# Patient Record
Sex: Male | Born: 1960 | Race: Black or African American | Hispanic: No | Marital: Single | Smoking: Former smoker
Health system: Southern US, Community
[De-identification: ages and names within clinical notes are randomized; demographics above are authoritative.]

## PROBLEM LIST (undated history)

## (undated) DIAGNOSIS — E119 Type 2 diabetes mellitus without complications: Secondary | ICD-10-CM

## (undated) DIAGNOSIS — E785 Hyperlipidemia, unspecified: Secondary | ICD-10-CM

## (undated) DIAGNOSIS — M1990 Unspecified osteoarthritis, unspecified site: Secondary | ICD-10-CM

## (undated) DIAGNOSIS — I10 Essential (primary) hypertension: Secondary | ICD-10-CM

## (undated) HISTORY — DX: Unspecified osteoarthritis, unspecified site: M19.90

## (undated) HISTORY — DX: Type 2 diabetes mellitus without complications: E11.9

## (undated) HISTORY — DX: Essential (primary) hypertension: I10

## (undated) HISTORY — PX: COLONOSCOPY WITH PROPOFOL: SHX5780

## (undated) HISTORY — PX: APPENDECTOMY: SHX54

## (undated) HISTORY — DX: Hyperlipidemia, unspecified: E78.5

---

## 2020-10-30 ENCOUNTER — Emergency Department
Admission: EM | Admit: 2020-10-30 | Discharge: 2020-10-30 | Disposition: A | Discharge status: Home / Self Care | Payer: Medicare Other | Attending: Student in an Organized Health Care Education/Training Program | Admitting: Student in an Organized Health Care Education/Training Program

## 2020-10-30 ENCOUNTER — Other Ambulatory Visit: Payer: Self-pay

## 2020-10-30 DIAGNOSIS — M25511 Pain in right shoulder: Secondary | ICD-10-CM | POA: Diagnosis not present

## 2020-10-30 DIAGNOSIS — M79604 Pain in right leg: Secondary | ICD-10-CM | POA: Diagnosis not present

## 2020-10-30 DIAGNOSIS — M25512 Pain in left shoulder: Secondary | ICD-10-CM | POA: Insufficient documentation

## 2020-10-30 DIAGNOSIS — M79605 Pain in left leg: Secondary | ICD-10-CM | POA: Diagnosis not present

## 2020-10-30 DIAGNOSIS — Z7984 Long term (current) use of oral hypoglycemic drugs: Secondary | ICD-10-CM | POA: Insufficient documentation

## 2020-10-30 DIAGNOSIS — M2550 Pain in unspecified joint: Secondary | ICD-10-CM

## 2020-10-30 DIAGNOSIS — Z794 Long term (current) use of insulin: Secondary | ICD-10-CM | POA: Diagnosis not present

## 2020-10-30 DIAGNOSIS — R000 Tachycardia, unspecified: Secondary | ICD-10-CM | POA: Diagnosis not present

## 2020-10-30 LAB — COMPREHENSIVE METABOLIC PANEL
ALT: 6 U/L (ref 0–44)
AST: 10 U/L — ABNORMAL LOW (ref 15–41)
Albumin: 3.4 g/dL — ABNORMAL LOW (ref 3.5–5.0)
Alkaline Phosphatase: 89 U/L (ref 38–126)
Anion gap: 10 (ref 5–15)
BUN: 8 mg/dL (ref 6–20)
CO2: 31 mmol/L (ref 22–32)
Calcium: 8.7 mg/dL — ABNORMAL LOW (ref 8.9–10.3)
Chloride: 99 mmol/L (ref 98–111)
Creatinine, Ser: 0.78 mg/dL (ref 0.61–1.24)
GFR, Estimated: >60 mL/min (ref >60)
Glucose, Bld: 110 mg/dL — ABNORMAL HIGH (ref 70–99)
Potassium: 3.4 mmol/L — ABNORMAL LOW (ref 3.5–5.1)
Sodium: 140 mmol/L (ref 135–145)
Total Bilirubin: 0.6 mg/dL (ref 0.3–1.2)
Total Protein: 6.6 g/dL (ref 6.5–8.1)

## 2020-10-30 LAB — CBC
HCT: 43.4 % (ref 39.0–52.0)
Hemoglobin: 14.4 g/dL (ref 13.0–17.0)
MCH: 30.6 pg (ref 26.0–34.0)
MCHC: 33.2 g/dL (ref 30.0–36.0)
MCV: 92.3 fL (ref 80.0–100.0)
Platelets: 453 10*3/uL — ABNORMAL HIGH (ref 150–400)
RBC: 4.7 MIL/uL (ref 4.22–5.81)
RDW: 17.1 % — ABNORMAL HIGH (ref 11.5–15.5)
WBC: 10.2 10*3/uL (ref 4.0–10.5)
nRBC: 0 % (ref 0.0–0.2)

## 2020-10-30 LAB — CBG MONITORING, ED: Glucose-Capillary: 112 mg/dL — ABNORMAL HIGH (ref 70–99)

## 2020-10-30 MED ORDER — PREGABALIN 150 MG PO CAPS: 150.0000 mg | ORAL_CAPSULE | Freq: Two times a day (BID) | ORAL | 2 refills | Status: DC

## 2020-10-30 MED ORDER — INSULIN DEGLUDEC 100 UNIT/ML ~~LOC~~ SOPN: 32.0000 [IU] | PEN_INJECTOR | Freq: Every day | SUBCUTANEOUS | 1 refills | Status: DC

## 2020-10-30 MED ORDER — HYDROCHLOROTHIAZIDE 25 MG PO TABS: 25.0000 mg | ORAL_TABLET | Freq: Every day | ORAL | 1 refills | Status: DC

## 2020-10-30 MED ORDER — HYDROCODONE-ACETAMINOPHEN 5-325 MG PO TABS
1.0000 | ORAL_TABLET | Freq: Once | ORAL | Status: AC
Start: 2020-10-30 — End: 2020-10-30
  Administered 2020-10-30: 1 via ORAL
  Filled 2020-10-30: qty 1

## 2020-10-30 MED ORDER — LOSARTAN POTASSIUM 25 MG PO TABS: 25.0000 mg | ORAL_TABLET | Freq: Every day | ORAL | 11 refills | Status: DC

## 2020-10-30 MED ORDER — OXYCODONE-ACETAMINOPHEN 10-325 MG PO TABS: 1.0000 | ORAL_TABLET | Freq: Four times a day (QID) | ORAL | 0 refills | Status: DC | PRN

## 2020-10-30 MED ORDER — ATORVASTATIN CALCIUM 40 MG PO TABS: 40.0000 mg | ORAL_TABLET | Freq: Every day | ORAL | 11 refills | Status: DC

## 2020-10-30 MED ORDER — METFORMIN HCL 1000 MG PO TABS: 1000.0000 mg | ORAL_TABLET | Freq: Every day | ORAL | 1 refills | Status: DC

## 2020-10-30 MED ORDER — VICTOZA 18 MG/3ML ~~LOC~~ SOPN: 1.2000 mg | PEN_INJECTOR | Freq: Every day | SUBCUTANEOUS | 0 refills | Status: DC

## 2020-10-30 NOTE — ED Provider Notes (Signed)
Emergency Medicine Provider Triage Evaluation Note  James [name] , a 60 y.o. male  was evaluated in triage.  Pt complains of  bilateral leg pain and shoulder pain for several months.  Came to ER because can get pcp appointment until December.  Out of diabetic meds..  Review of Systems  Positive: Joint pain Negative: fever  Physical Exam  BP 135/77 (BP Location: Left Arm)   Pulse 82   Temp 97.8 F (36.6 C) (Oral)   Resp 20   Ht 6' (1.829 m)   Wt (!) 170.1 kg   SpO2 97%   BMI 50.86 kg/m  Gen:   Awake, no distress   Resp:  Normal effort  MSK:   Moves extremities without difficulty  Other:    Medical Decision Making  Medically screening exam initiated at 11:31 AM.  Appropriate orders placed.  James [name] was informed that the remainder of the evaluation will be completed by another provider, this initial triage assessment does not replace that evaluation, and the importance of remaining in the ED until their evaluation is complete.     Merlyn Lot, MD 10/30/20 1135

## 2020-10-30 NOTE — ED Triage Notes (Signed)
Pt here with bilateral leg pain and left shoulder pain. Pt just moved here from [place] and has not established primary care here and is not able to see someone until December. Pt was receiving steroid shots in [place]. Pt needs diabetic medication and pain medication. Pt in NAD in triage.

## 2020-10-30 NOTE — ED Provider Notes (Signed)
[facility] Emergency Department Provider Note    Event Date/Time   First MD Initiated Contact with Patient 10/30/20 1225     (approximate)  I have reviewed the triage vital signs and the nursing notes.   HISTORY  Chief Complaint Leg Pain and Shoulder Pain    HPI James [name] is a 60 y.o. male with an extensive past medical history presents to the ER for evaluation of several months of leg pain as well as shoulder pain.  Denies any injury.  Has a history of bursitis as well as arthritis previously having cortisone shots performed when he is living back in [place].  Recently moved to the area and has not established care with primary doctor.  States has been out of his medications for several weeks.  Denies any fevers no chest pain or shortness of breath.  No nausea or vomiting.  States his primary reason for coming in is to get refills on his medications.  No past medical history on file. No family history on file.  There are no problems to display for this patient.     Prior to Admission medications   Medication Sig Start Date End Date Taking? Authorizing Provider  atorvastatin (LIPITOR) 40 MG tablet Take 1 tablet (40 mg total) by mouth daily. 10/30/20 10/30/21 Yes Merlyn Lot, MD  hydrochlorothiazide (HYDRODIURIL) 25 MG tablet Take 1 tablet (25 mg total) by mouth daily. 10/30/20  Yes Merlyn Lot, MD  insulin degludec ([name]) 100 UNIT/ML FlexTouch Pen Inject 32 Units into the skin daily. 10/30/20  Yes Merlyn Lot, MD  liraglutide (VICTOZA) 18 MG/3ML SOPN Inject 1.2 mg into the skin daily. 10/30/20  Yes Merlyn Lot, MD  losartan (COZAAR) 25 MG tablet Take 1 tablet (25 mg total) by mouth daily. 10/30/20 10/30/21 Yes Merlyn Lot, MD  metFORMIN (GLUCOPHAGE) 1000 MG tablet Take 1 tablet (1,000 mg total) by mouth daily with breakfast. 10/30/20  Yes Merlyn Lot, MD  oxyCODONE-acetaminophen (PERCOCET) 10-325 MG tablet  Take 1 tablet by mouth every 6 (six) hours as needed for pain. 10/30/20 10/30/21 Yes Merlyn Lot, MD  pregabalin (LYRICA) 150 MG capsule Take 1 capsule (150 mg total) by mouth 2 (two) times daily. 300mg  in AM, 150mg  at night 10/30/20 10/30/21 Yes Merlyn Lot, MD    Allergies Patient has no known allergies.    Social History    Review of Systems Patient denies headaches, rhinorrhea, blurry vision, numbness, shortness of breath, chest pain, edema, cough, abdominal pain, nausea, vomiting, diarrhea, dysuria, fevers, rashes or hallucinations unless otherwise stated above in HPI. ____________________________________________   PHYSICAL EXAM:  VITAL SIGNS: Vitals:   10/30/20 1123  BP: 135/77  Pulse: 82  Resp: 20  Temp: 97.8 F (36.6 C)  SpO2: 97%    Constitutional: Alert and oriented.  Eyes: Conjunctivae are normal.  Head: Atraumatic. Nose: No congestion/rhinnorhea. Mouth/Throat: Mucous membranes are moist.   Neck: No stridor. Painless ROM.  Cardiovascular: Normal rate, regular rhythm. Grossly normal heart sounds.  Good peripheral circulation. Respiratory: Normal respiratory effort.  No retractions. Lungs CTAB. Gastrointestinal: Soft and nontender. No distention. No abdominal bruits. No CVA tenderness. Genitourinary:  Musculoskeletal: No lower extremity tenderness nor edema.  No joint effusions.  No deformity.  N/v intact throughout Neurologic:  Normal speech and language. No gross focal neurologic deficits are appreciated. No facial droop Skin:  Skin is warm, dry and intact. No rash noted. Psychiatric: Mood and affect are normal. Speech and behavior are normal.  ____________________________________________   LABS (  all labs ordered are listed, but only abnormal results are displayed)  Results for orders placed or performed during the hospital encounter of 10/30/20 (from the past 24 hour(s))  CBG monitoring, ED     Status: Abnormal   Collection Time: 10/30/20  11:27 AM  Result Value Ref Range   Glucose-Capillary 112 (H) 70 - 99 mg/dL  CBC     Status: Abnormal   Collection Time: 10/30/20 11:28 AM  Result Value Ref Range   WBC 10.2 4.0 - 10.5 K/uL   RBC 4.70 4.22 - 5.81 MIL/uL   Hemoglobin 14.4 13.0 - 17.0 g/dL   HCT 43.4 39.0 - 52.0 %   MCV 92.3 80.0 - 100.0 fL   MCH 30.6 26.0 - 34.0 pg   MCHC 33.2 30.0 - 36.0 g/dL   RDW 17.1 (H) 11.5 - 15.5 %   Platelets 453 (H) 150 - 400 K/uL   nRBC 0.0 0.0 - 0.2 %  Comprehensive metabolic panel     Status: Abnormal   Collection Time: 10/30/20 11:28 AM  Result Value Ref Range   Sodium 140 135 - 145 mmol/L   Potassium 3.4 (L) 3.5 - 5.1 mmol/L   Chloride 99 98 - 111 mmol/L   CO2 31 22 - 32 mmol/L   Glucose, Bld 110 (H) 70 - 99 mg/dL   BUN 8 6 - 20 mg/dL   Creatinine, Ser 0.78 0.61 - 1.24 mg/dL   Calcium 8.7 (L) 8.9 - 10.3 mg/dL   Total Protein 6.6 6.5 - 8.1 g/dL   Albumin 3.4 (L) 3.5 - 5.0 g/dL   AST 10 (L) 15 - 41 U/L   ALT 6 0 - 44 U/L   Alkaline Phosphatase 89 38 - 126 U/L   Total Bilirubin 0.6 0.3 - 1.2 mg/dL   GFR, Estimated >60 >60 mL/min   Anion gap 10 5 - 15   ____________________________________________  EKG My review and personal interpretation at Time: 11:35   Indication: shoulder pain  Rate: 80  Rhythm: sinus Axis: normal Other: normal intervals, no stemi ____________________________________________  RADIOLOGY  ____________________________________________   PROCEDURES  Procedure(s) performed:  Procedures    Critical Care performed: no ____________________________________________   INITIAL IMPRESSION / ASSESSMENT AND PLAN / ED COURSE  Pertinent labs & imaging results that were available during my care of the patient were reviewed by me and considered in my medical decision making (see chart for details).   DDX: Arthralgia, arthritis, bursitis, chronic pain, medication noncompliance, will check  James [name] is a 60 y.o. who presents to the ED with  presentation as described above.  Patient clinically very well-appearing in no acute distress.  Symptoms have been chronic well-known to the patient with no new features.  Primary concern is needing refills on his medications he is not able to get into see his primary doctor until December.  Does not appear to have infectious process.  Exam is otherwise reassuring.  Refill sent to his pharmacy.  Discussed strict return precautions.     The patient was evaluated in Emergency Department today for the symptoms described in the history of present illness. He/she was evaluated in the context of the global COVID-19 pandemic, which necessitated consideration that the patient might be at risk for infection with the SARS-CoV-2 virus that causes COVID-19. Institutional protocols and algorithms that pertain to the evaluation of patients at risk for COVID-19 are in a state of rapid change based on information released by regulatory bodies including the CDC and federal and  state organizations. These policies and algorithms were followed during the patient's care in the ED.  As part of my medical decision making, I reviewed the following data within the [place] notes reviewed and incorporated, Labs reviewed, notes from prior ED visits and [place] Controlled Substance Database   ____________________________________________   FINAL CLINICAL IMPRESSION(S) / ED DIAGNOSES  Final diagnoses:  Arthralgia, unspecified joint      NEW MEDICATIONS STARTED DURING THIS VISIT:  New Prescriptions   ATORVASTATIN (LIPITOR) 40 MG TABLET    Take 1 tablet (40 mg total) by mouth daily.   HYDROCHLOROTHIAZIDE (HYDRODIURIL) 25 MG TABLET    Take 1 tablet (25 mg total) by mouth daily.   INSULIN DEGLUDEC ([name]) 100 UNIT/ML FLEXTOUCH PEN    Inject 32 Units into the skin daily.   LIRAGLUTIDE (VICTOZA) 18 MG/3ML SOPN    Inject 1.2 mg into the skin daily.   LOSARTAN (COZAAR) 25 MG TABLET    Take 1 tablet (25  mg total) by mouth daily.   METFORMIN (GLUCOPHAGE) 1000 MG TABLET    Take 1 tablet (1,000 mg total) by mouth daily with breakfast.   OXYCODONE-ACETAMINOPHEN (PERCOCET) 10-325 MG TABLET    Take 1 tablet by mouth every 6 (six) hours as needed for pain.   PREGABALIN (LYRICA) 150 MG CAPSULE    Take 1 capsule (150 mg total) by mouth 2 (two) times daily. 300mg  in AM, 150mg  at night     Note:  This document was prepared using Dragon voice recognition software and may include unintentional dictation errors.    Merlyn Lot, MD 10/30/20 1259

## 2020-11-09 ENCOUNTER — Ambulatory Visit (INDEPENDENT_AMBULATORY_CARE_PROVIDER_SITE_OTHER): Payer: Medicare Other | Admitting: Family Medicine

## 2020-11-09 ENCOUNTER — Other Ambulatory Visit: Payer: Self-pay | Admitting: Family Medicine

## 2020-11-09 ENCOUNTER — Other Ambulatory Visit: Payer: Self-pay

## 2020-11-09 ENCOUNTER — Encounter: Payer: Self-pay | Admitting: Family Medicine

## 2020-11-09 VITALS — BP 132/83 | HR 94 | Ht 72.0 in | Wt 367.0 lb

## 2020-11-09 DIAGNOSIS — G8929 Other chronic pain: Secondary | ICD-10-CM

## 2020-11-09 DIAGNOSIS — Z794 Long term (current) use of insulin: Secondary | ICD-10-CM

## 2020-11-09 DIAGNOSIS — M5136 Other intervertebral disc degeneration, lumbar region: Secondary | ICD-10-CM | POA: Insufficient documentation

## 2020-11-09 DIAGNOSIS — Z23 Encounter for immunization: Secondary | ICD-10-CM

## 2020-11-09 DIAGNOSIS — M159 Polyosteoarthritis, unspecified: Secondary | ICD-10-CM | POA: Diagnosis not present

## 2020-11-09 DIAGNOSIS — E1169 Type 2 diabetes mellitus with other specified complication: Secondary | ICD-10-CM

## 2020-11-09 DIAGNOSIS — M51369 Other intervertebral disc degeneration, lumbar region without mention of lumbar back pain or lower extremity pain: Secondary | ICD-10-CM | POA: Insufficient documentation

## 2020-11-09 DIAGNOSIS — E119 Type 2 diabetes mellitus without complications: Secondary | ICD-10-CM | POA: Insufficient documentation

## 2020-11-09 DIAGNOSIS — M25551 Pain in right hip: Secondary | ICD-10-CM

## 2020-11-09 DIAGNOSIS — G894 Chronic pain syndrome: Secondary | ICD-10-CM

## 2020-11-09 MED ORDER — OXYCODONE-ACETAMINOPHEN 10-325 MG PO TABS: 1.0000 | ORAL_TABLET | Freq: Two times a day (BID) | ORAL | 0 refills | Status: DC | PRN

## 2020-11-09 NOTE — Telephone Encounter (Signed)
Medication: pregabalin (LYRICA) 150 MG capsule [[id number]]   Has the patient contacted their pharmacy? Yes- Advised to contact the doctor (Agent: If no, request that the patient contact the pharmacy for the refill. If patient does not wish to contact the pharmacy document the reason why and proceed with request.) (Agent: If yes, when and what did the pharmacy advise?)  Preferred Pharmacy (with phone number or street name): [id number] - [place], [place] [address] [place] [address] Phone: [phone] Fax: [phone] Hours: Not open 24 hours   Has the patient been seen for an appointment in the last year OR does the patient have an upcoming appointment? Today  Agent: Please be advised that RX refills may take up to 3 business days. We ask that you follow-up with your pharmacy.

## 2020-11-09 NOTE — Progress Notes (Signed)
Subjective:    Patient ID: James [name], male    DOB: 26-Jun-1960, 60 y.o.   MRN: [id number]  Kristan Votta is a 60 y.o. 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  HPI  In [place] he was followed by Orthopedic, he was planning to bariatric surgery He was on steroids in the past and gained weight. He was on chronic pain.  His new patient apt is scheduled for December 2022, however today is hospital f/u from ED due to acute needs.   ED FOLLOW-UP VISIT  Hospital/Location: [facility] Date of ED Visit: 10/30/20  Reason for Presenting to ED: Chronic Joint Pain / Arthritis  FOLLOW-UP  - ED provider note and record have been reviewed - Patient presents today about 10 days after recent ED visit. Brief summary of recent course, patient had symptoms of chronic pain for years, previously in [place] on pain management and had followed with orthopedic he was advised to have hip replacement and other joint surgery but he could not pursue due to obesity, he was going to proceed with bariatric surgery, but did not complete it until he moved. He has been on chronic steroids for joint pain and arthritis and developed diabetes and wt gain complications.  He has been on oxycodone for period of time for chronic pain to help him function, he was seen in ED 10 days ago and given short term supply, has not established w/ PCP, Pain Doctor locally, or Ortho or Bariatric. Will need referrals. He is out of pain medication. He did not get Lyrica even though it was sent by ED  ED RE ordered all his medications including Oxycodone 12 pills  He takes Oxycodone 10/325 mg 1-2 per day PRN pain, some days he will SKIP and not take at all, otherwise max is 2 per day  He uses walker for ambulation, he has difficulty with changing position from sitting to standing at  times depending on chair he is in.  Other medications have been refilled by ED  Type 2 Diabetes On insulin, [name] and Victoza, he was on 32 u tresiba, and asks about dosing, his last A1c in 6 range has remained controlled, also on metformin  I have reviewed the discharge medication list, and have reconciled the current and discharge medications today.   No flowsheet data found.  Past Medical History:  Diagnosis Date   Arthritis    Diabetes mellitus without complication ([place])    Hyperlipidemia    Hypertension    History reviewed. No pertinent surgical history. Social History   Socioeconomic History   Marital status: Single    Spouse name: Not on file   Number of children: Not on file   Years of education: Not on file   Highest education level: Not on file  Occupational History   Not on file  Tobacco Use   Smoking status: Not on file   Smokeless tobacco: Not on file  Substance and Sexual Activity   Alcohol use: Not on file   Drug use: Not on file   Sexual activity: Not on file  Other Topics Concern   Not on file  Social History Narrative   Not on file   Social Determinants of Health   Financial Resource Strain: Not on file  Food Insecurity: Not on file  Transportation Needs: Not on file  Physical Activity:  Not on file  Stress: Not on file  Social Connections: Not on file  Intimate Partner Violence: Not on file   History reviewed. No pertinent family history. Current Outpatient Medications on File Prior to Visit  Medication Sig   atorvastatin (LIPITOR) 40 MG tablet Take 1 tablet (40 mg total) by mouth daily.   hydrochlorothiazide (HYDRODIURIL) 25 MG tablet Take 1 tablet (25 mg total) by mouth daily.   insulin degludec ([name]) 100 UNIT/ML FlexTouch Pen Inject 32 Units into the skin daily.   liraglutide (VICTOZA) 18 MG/3ML SOPN Inject 1.2 mg into the skin daily.   losartan (COZAAR) 25 MG tablet Take 1 tablet (25 mg total) by mouth daily.   metFORMIN  (GLUCOPHAGE) 1000 MG tablet Take 1 tablet (1,000 mg total) by mouth daily with breakfast.   pregabalin (LYRICA) 150 MG capsule Take 1 capsule (150 mg total) by mouth 2 (two) times daily. 300mg  in AM, 150mg  at night   No current facility-administered medications on file prior to visit.    Review of Systems Per HPI unless specifically indicated above      Objective:    BP 132/83   Pulse 94   Ht 6' (1.829 m)   Wt (!) 367 lb (166.5 kg)   SpO2 96%   BMI 49.77 kg/m   Wt Readings from Last 3 Encounters:  11/09/20 (!) 367 lb (166.5 kg)  10/30/20 (!) 375 lb (170.1 kg)    Physical Exam Vitals and nursing note reviewed.  Constitutional:      General: He is not in acute distress.    Appearance: Normal appearance. He is well-developed. He is obese. He is not diaphoretic.     Comments: Well-appearing, uncomfortable/pain, cooperative  HENT:     Head: Normocephalic and atraumatic.  Eyes:     General:        Right eye: No discharge.        Left eye: No discharge.     Conjunctiva/sclera: Conjunctivae normal.  Cardiovascular:     Rate and Rhythm: Normal rate.  Pulmonary:     Effort: Pulmonary effort is normal.  Musculoskeletal:     Comments: Using walker  Skin:    General: Skin is warm and dry.     Findings: No erythema or rash.  Neurological:     Mental Status: He is alert and oriented to person, place, and time.  Psychiatric:        Mood and Affect: Mood normal.        Behavior: Behavior normal.        Thought Content: Thought content normal.     Comments: Well groomed, good eye contact, normal speech and thoughts   Results for orders placed or performed during the hospital encounter of 10/30/20  CBC  Result Value Ref Range   WBC 10.2 4.0 - 10.5 K/uL   RBC 4.70 4.22 - 5.81 MIL/uL   Hemoglobin 14.4 13.0 - 17.0 g/dL   HCT 43.4 39.0 - 52.0 %   MCV 92.3 80.0 - 100.0 fL   MCH 30.6 26.0 - 34.0 pg   MCHC 33.2 30.0 - 36.0 g/dL   RDW 17.1 (H) 11.5 - 15.5 %   Platelets 453 (H)  150 - 400 K/uL   nRBC 0.0 0.0 - 0.2 %  Comprehensive metabolic panel  Result Value Ref Range   Sodium 140 135 - 145 mmol/L   Potassium 3.4 (L) 3.5 - 5.1 mmol/L   Chloride 99 98 - 111 mmol/L   CO2 31  22 - 32 mmol/L   Glucose, Bld 110 (H) 70 - 99 mg/dL   BUN 8 6 - 20 mg/dL   Creatinine, Ser 0.78 0.61 - 1.24 mg/dL   Calcium 8.7 (L) 8.9 - 10.3 mg/dL   Total Protein 6.6 6.5 - 8.1 g/dL   Albumin 3.4 (L) 3.5 - 5.0 g/dL   AST 10 (L) 15 - 41 U/L   ALT 6 0 - 44 U/L   Alkaline Phosphatase 89 38 - 126 U/L   Total Bilirubin 0.6 0.3 - 1.2 mg/dL   GFR, Estimated >60 >60 mL/min   Anion gap 10 5 - 15  CBG monitoring, ED  Result Value Ref Range   Glucose-Capillary 112 (H) 70 - 99 mg/dL      Assessment & Plan:   Problem List Items Addressed This Visit     Primary osteoarthritis involving multiple joints   Relevant Medications   oxyCODONE-acetaminophen (PERCOCET) 10-325 MG tablet   Other Relevant Orders   Ambulatory referral to Pain Clinic   Ambulatory referral to Orthopedic Surgery   Diabetes mellitus ([place])   DDD (degenerative disc disease), lumbar   Relevant Medications   oxyCODONE-acetaminophen (PERCOCET) 10-325 MG tablet   Other Relevant Orders   Ambulatory referral to Pain Clinic   Ambulatory referral to Orthopedic Surgery   Chronic pain syndrome - Primary   Relevant Medications   oxyCODONE-acetaminophen (PERCOCET) 10-325 MG tablet   Other Relevant Orders   Ambulatory referral to Pain Clinic   Ambulatory referral to Orthopedic Surgery   Other Visit Diagnoses     Chronic right hip pain       Relevant Medications   oxyCODONE-acetaminophen (PERCOCET) 10-325 MG tablet   Other Relevant Orders   Ambulatory referral to Pain Clinic   Ambulatory referral to Orthopedic Surgery   Needs flu shot       Relevant Orders   Flu Vaccine QUAD 29mo+IM (Fluarix, Fluzone & Alfiuria Quad PF)       chronic pain syndrome with multiple joint OA/DJD and Lumbar DDD, Hip Pain, Shoulder pain, he was  managed in [place] on chronic steroids and cortisone injections that caused worse weight gain and diabetes, he has been on opiate management per Pain Management in [place], records requested, now he is limited with ambulation due to pain and difficulty losing weight. He will be bridged on Oxycodone until he can establish with Pain Management.   PDMP reviewed.  Referral to [facility] Pain management, will temporarily cover Oxycodone for 30 pills for now, can return 30 days if need new order until established w/ pain management  He is aware I can only temporarily manage his chronic pain on opiate at this time.  Referral to Orthopedic surgery for consult on chronic joint pain and may warrant future surgical intervention, he was anticipating hip surgery in past, his goal is to lose weight prior to surgery  May need return to [facility], we can consider referral at next apt if indicated.   Meds ordered this encounter  Medications   oxyCODONE-acetaminophen (PERCOCET) 10-325 MG tablet    Sig: Take 1 tablet by mouth 2 (two) times daily as needed for pain.    Dispense:  30 tablet    Refill:  0      Follow up plan: Return in about 4 weeks (around 12/07/2020) for 4 weeks follow-up DM A1c / Pain Management / Wt.  Nobie Putnam, [facility] 11/09/2020, 10:47 AM

## 2020-11-09 NOTE — Patient Instructions (Addendum)
Thank you for coming to the office today.  Decrease [name] to 25 units per day Increase Victoza to 1.8 per day  [facility] [place], [place]  [address] Phone: [phone]  [facility] Address: [address] [place], [place], [place] [address] Phone: [phone]  Refilled medication for 1 month   Please schedule a Follow-up Appointment to: Return in about 4 weeks (around 12/07/2020) for 4 weeks follow-up DM A1c / Pain Management / Wt.  If you have any other questions or concerns, please feel free to call the office or send a message through [facility]. You may also schedule an earlier appointment if necessary.  Additionally, you may be receiving a survey about your experience at our office within a few days to 1 week by e-mail or mail. We value your feedback.  Nobie Putnam, DO [place]

## 2020-11-10 MED ORDER — PREGABALIN 150 MG PO CAPS: ORAL_CAPSULE | ORAL | 2 refills | Status: DC

## 2020-11-10 NOTE — Telephone Encounter (Signed)
Requested medication (s) are due for refill today:   Provider to review  Requested medication (s) are on the active medication list:   Yes  Future visit scheduled:   Yes   Last ordered: 10/30/2020 #60, 2 refills  Returned because this is a non delegated refill.  It was given in the ED.   Requested Prescriptions  Pending Prescriptions Disp Refills   pregabalin (LYRICA) 150 MG capsule 60 capsule 2    Sig: Take 1 capsule (150 mg total) by mouth 2 (two) times daily. 300mg  in AM, 150mg  at night     Not Delegated - Neurology:  Anticonvulsants - Controlled Failed - 11/09/2020  5:41 PM      Failed - This refill cannot be delegated      Passed - Valid encounter within last 12 months    Recent Outpatient Visits           Yesterday Chronic pain syndrome   [place], DO       Future Appointments             In 4 weeks Parks Ranger, Devonne Doughty, DO [facility], [place]   In 1 month Parks Ranger, Devonne Doughty, DO [facility], [place]   In 2 months Juline Patch, MD [facility], [facility]

## 2020-12-08 IMAGING — CR DG HIP (WITH OR WITHOUT PELVIS) 2-3V*L*
1 series · 3 of 3 positions shown · non-contrast
Comparison: None.

CLINICAL DATA: Chronic left hip pain

EXAM:
DG HIP (WITH OR WITHOUT PELVIS) 2-3V LEFT

[Series 1: dg hip unilat w or w/o pelvis 2-3 views  · non-contrast · 0.14mm/px · 3 of 3 slices shown]
[im 1/3]
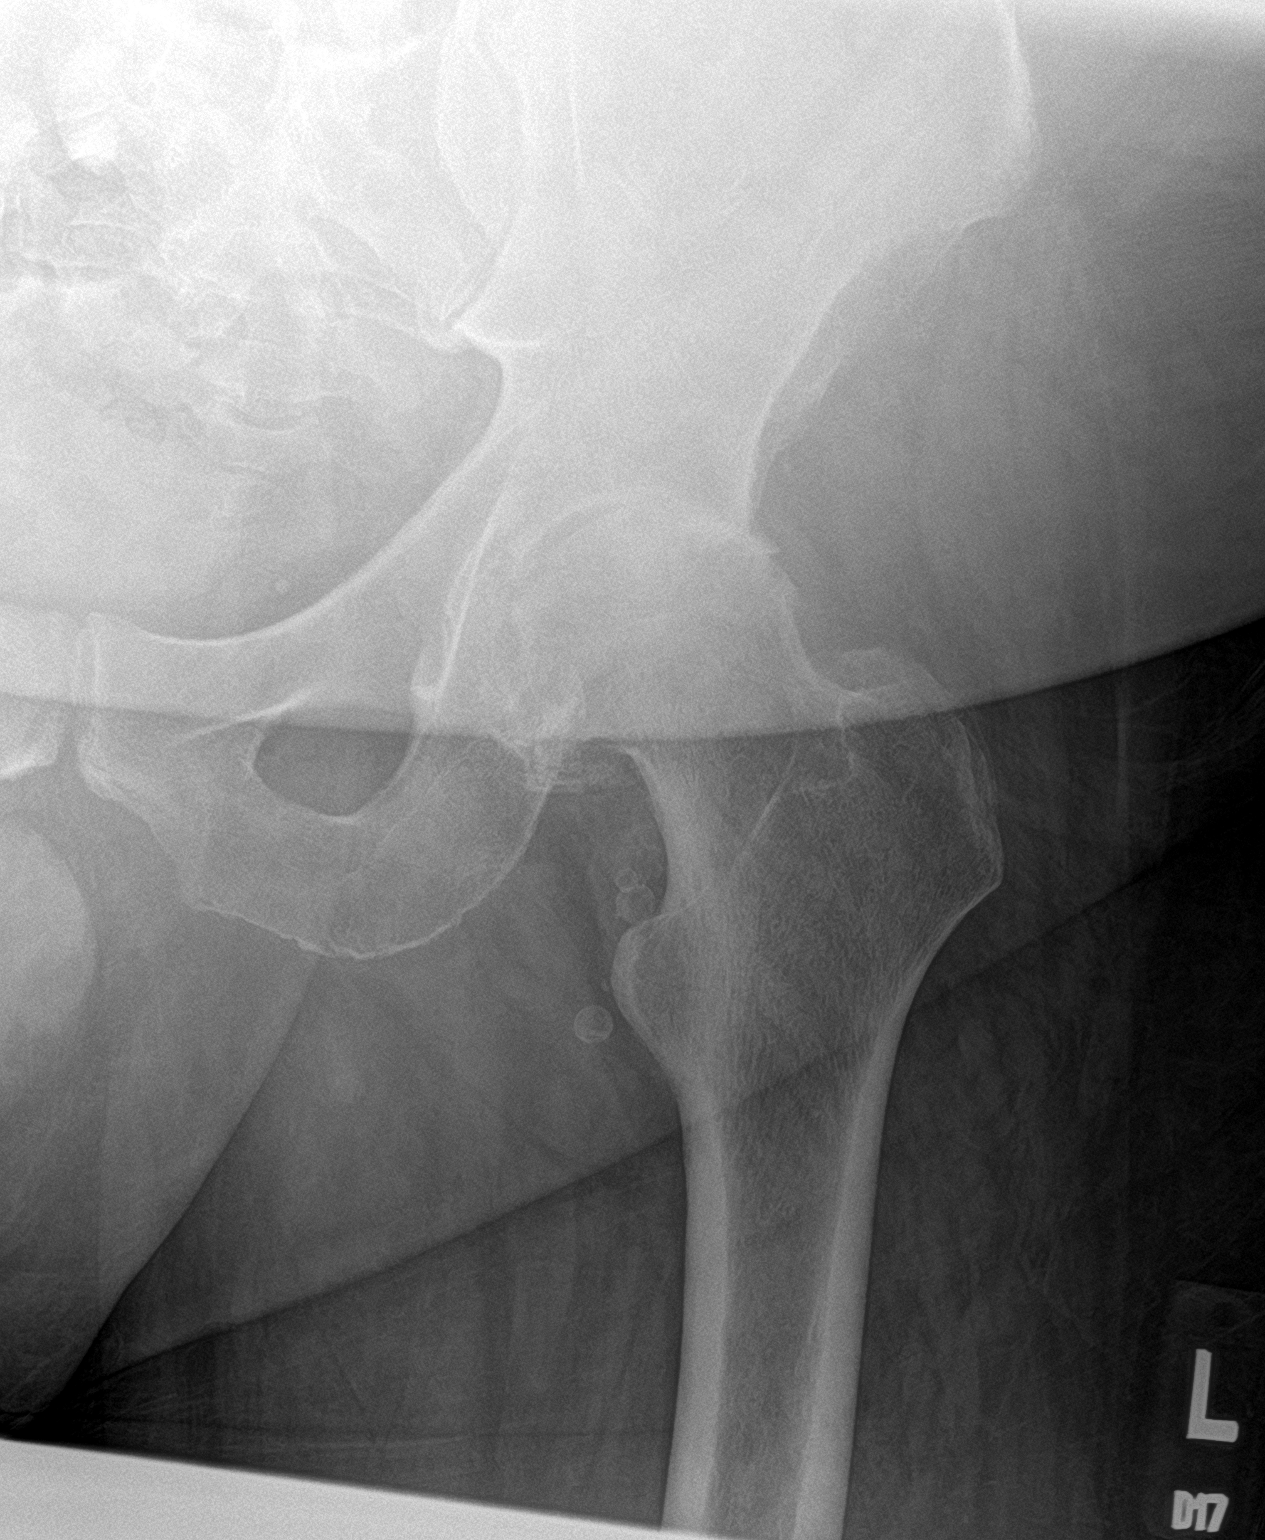
[im 2/3]
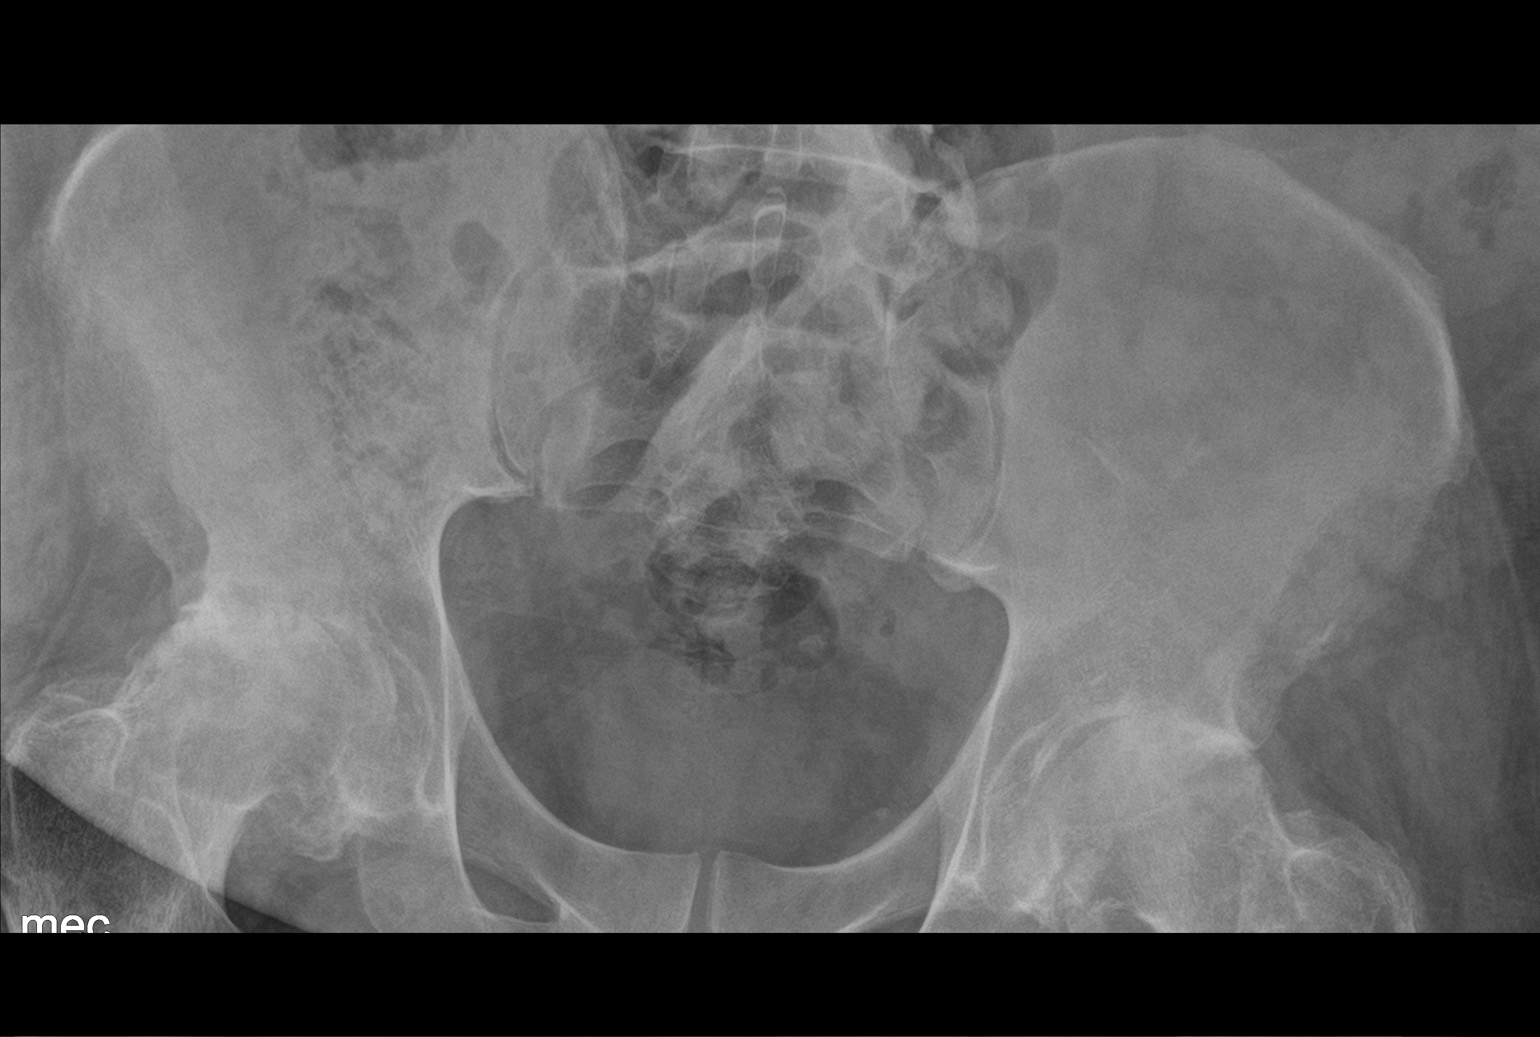
[im 3/3]
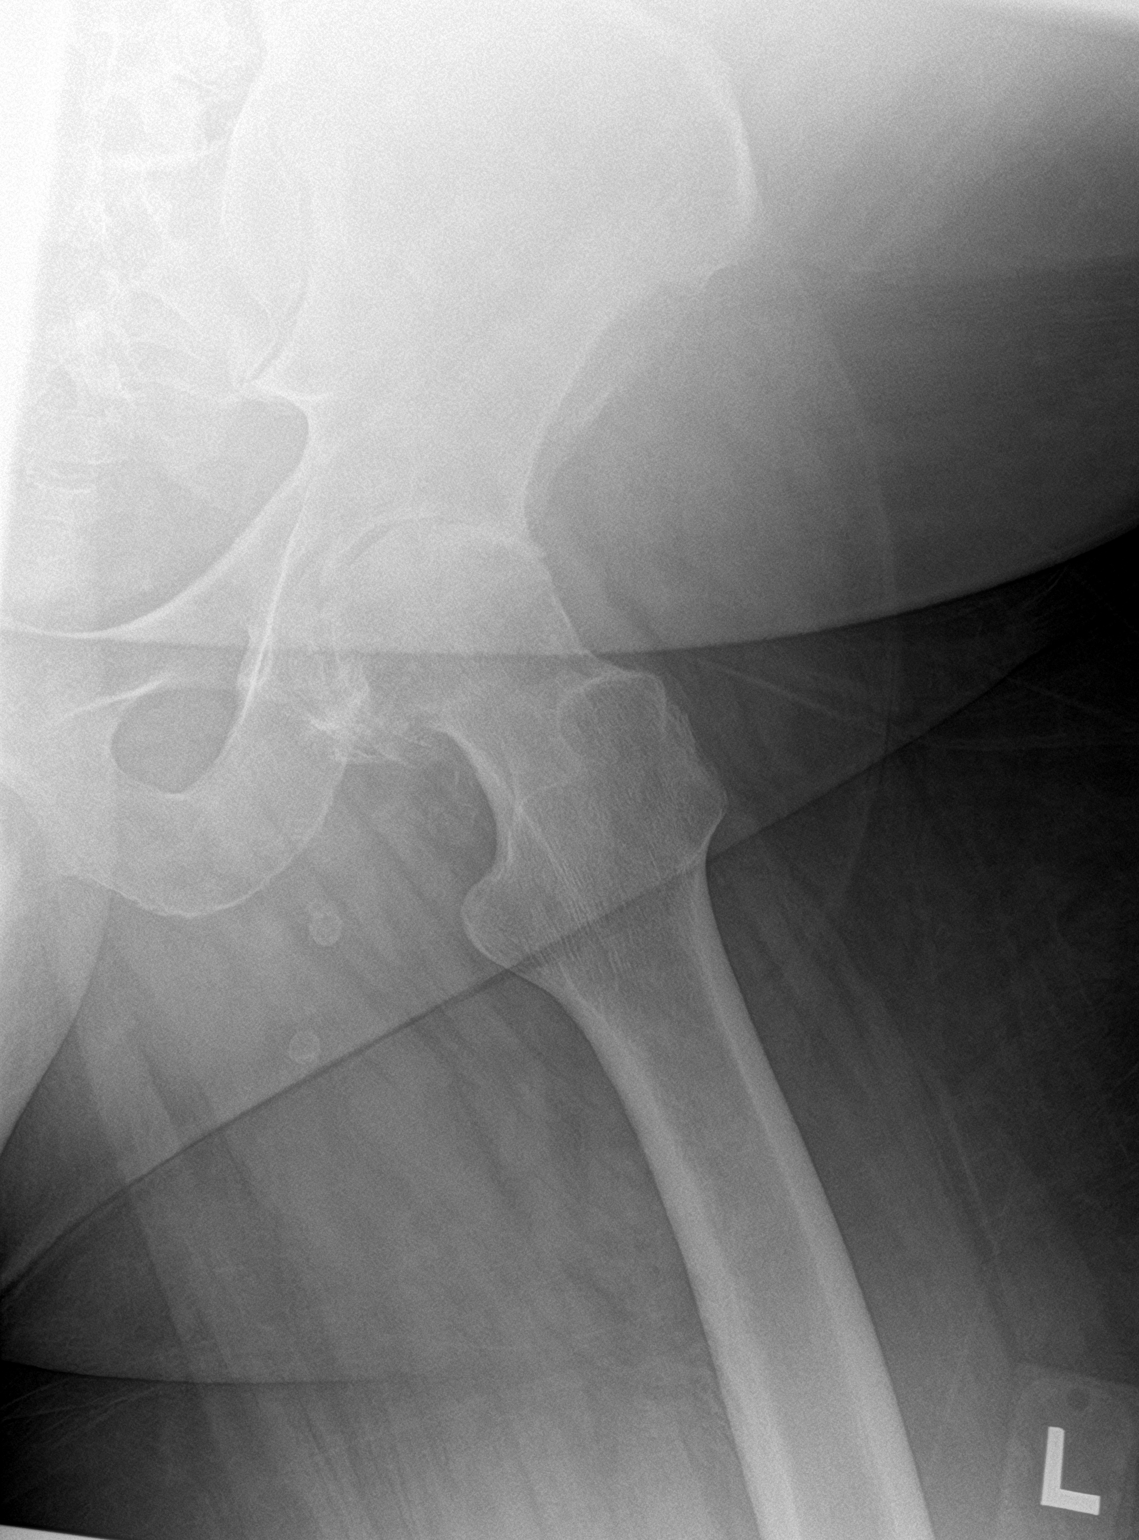

[3 of 3 positions shown; findings below may reference images not displayed]

FINDINGS: Severe bilateral degenerative hip arthritis with near complete loss
of the joint space. Subchondral cyst formation noted within the left
femoral head. Sacroiliac joint spaces are preserved. Soft tissues
are unremarkable.
IMPRESSION: Severe left hip degenerative arthritis.

## 2020-12-08 IMAGING — CR DG KNEE COMPLETE 4+V*R*
1 series · 4 of 4 positions shown · non-contrast
Comparison: None.

CLINICAL DATA: Right knee pain/arthralgia

EXAM:
RIGHT KNEE - COMPLETE 4+ VIEW

[Series 1: dg knee complete 4 views right · 0.14mm/px · 4 of 4 slices shown]
[im 1/4]
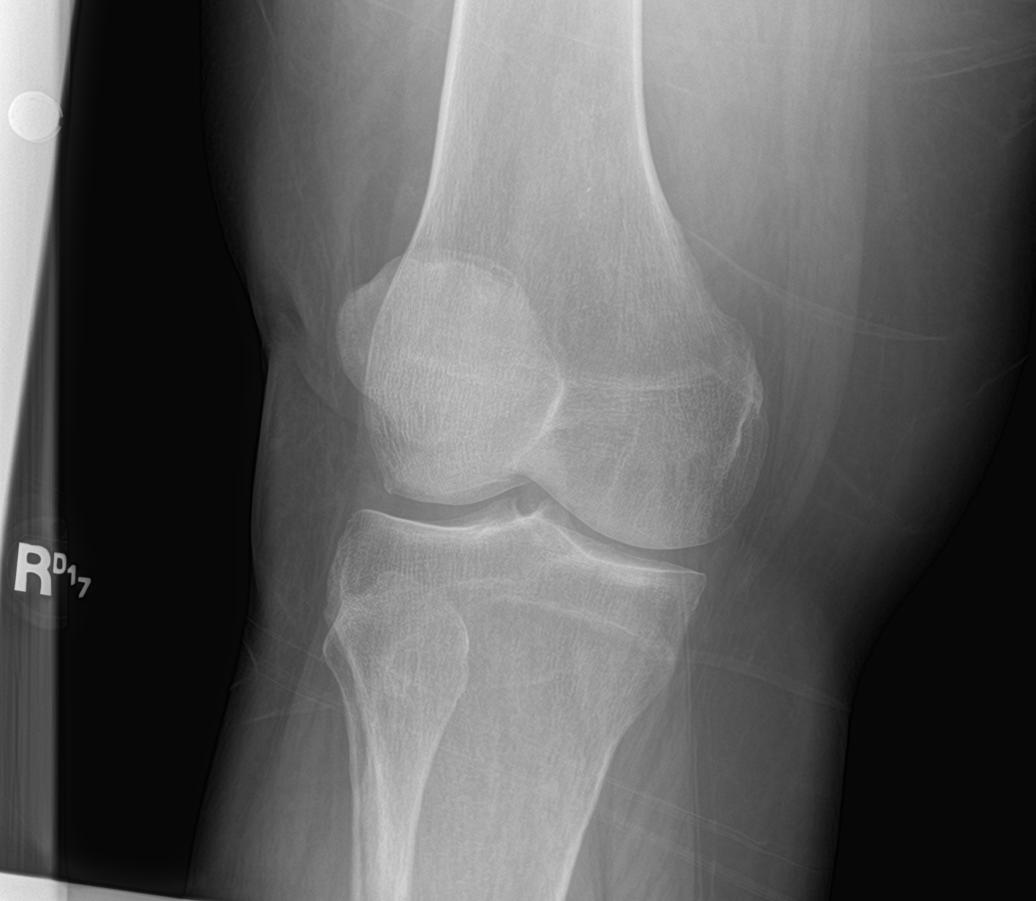
[im 2/4]
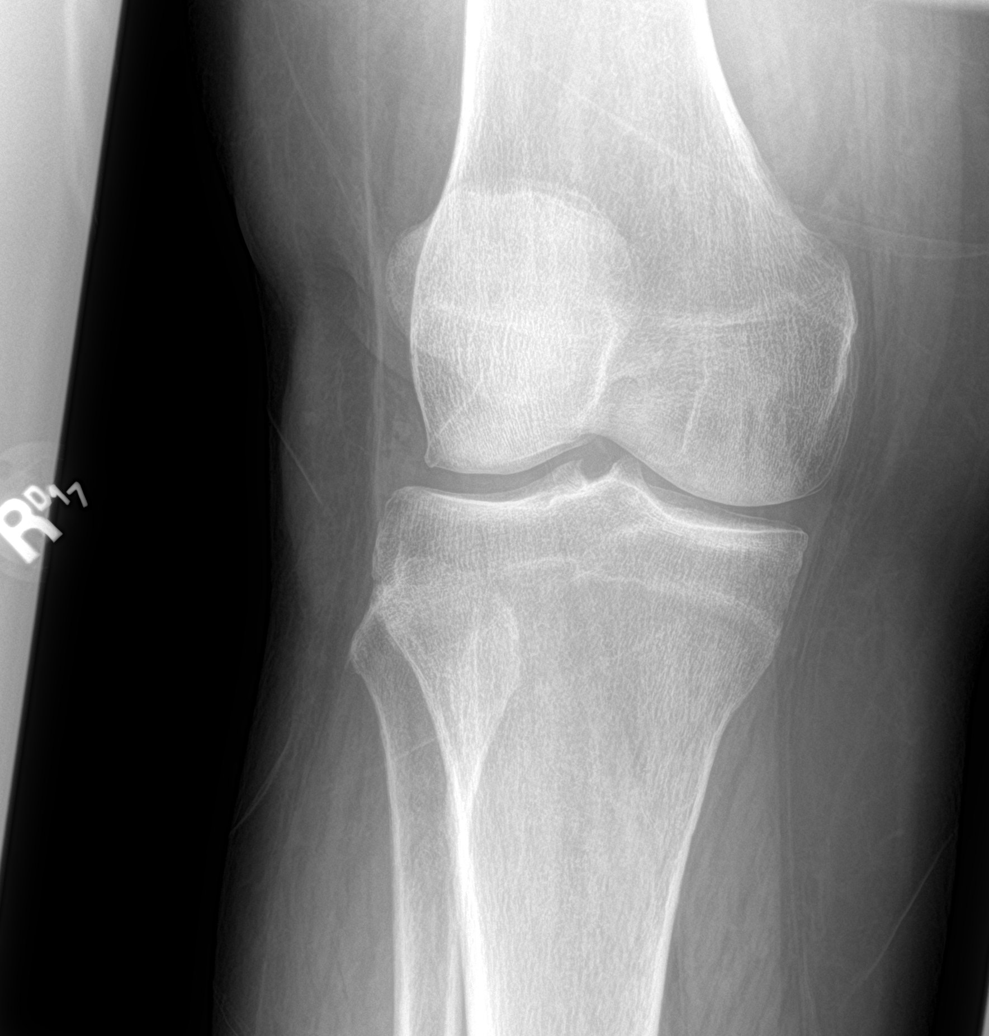
[im 3/4]
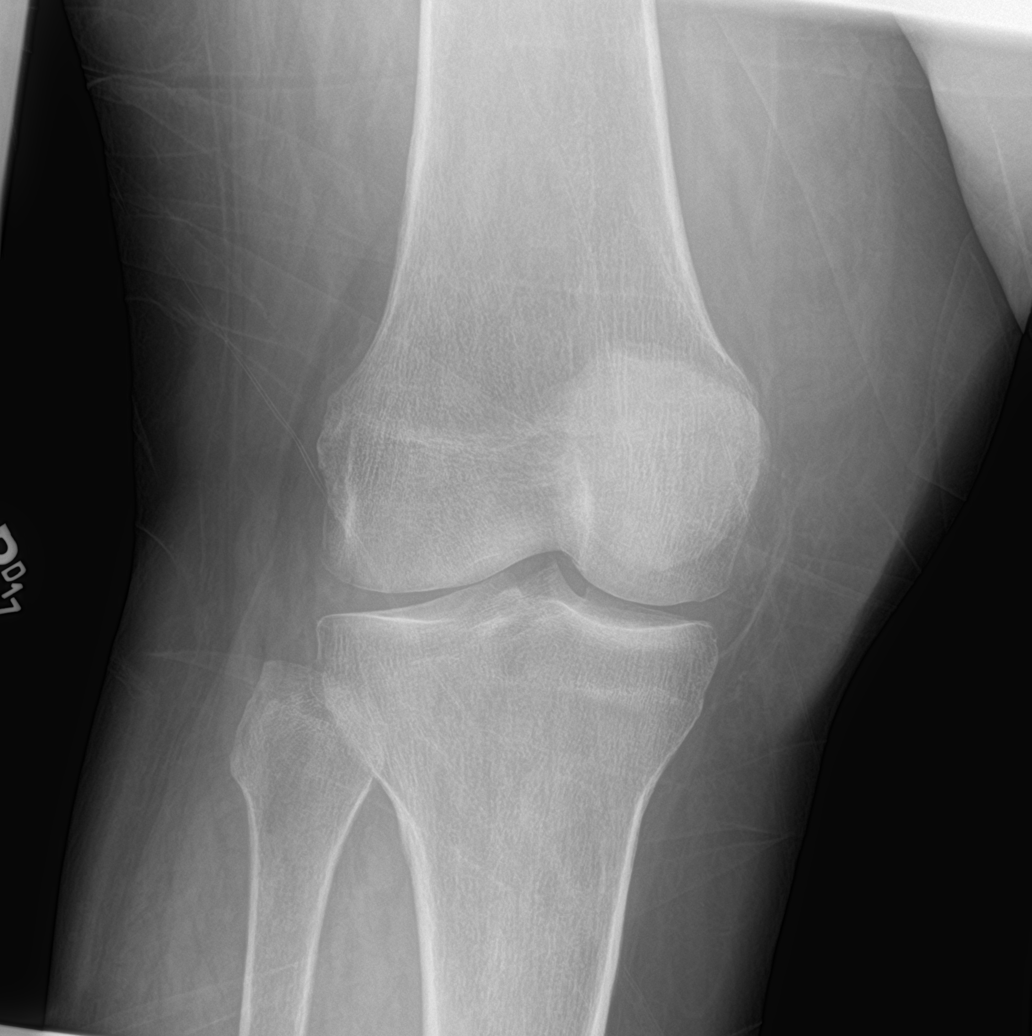
[im 4/4]
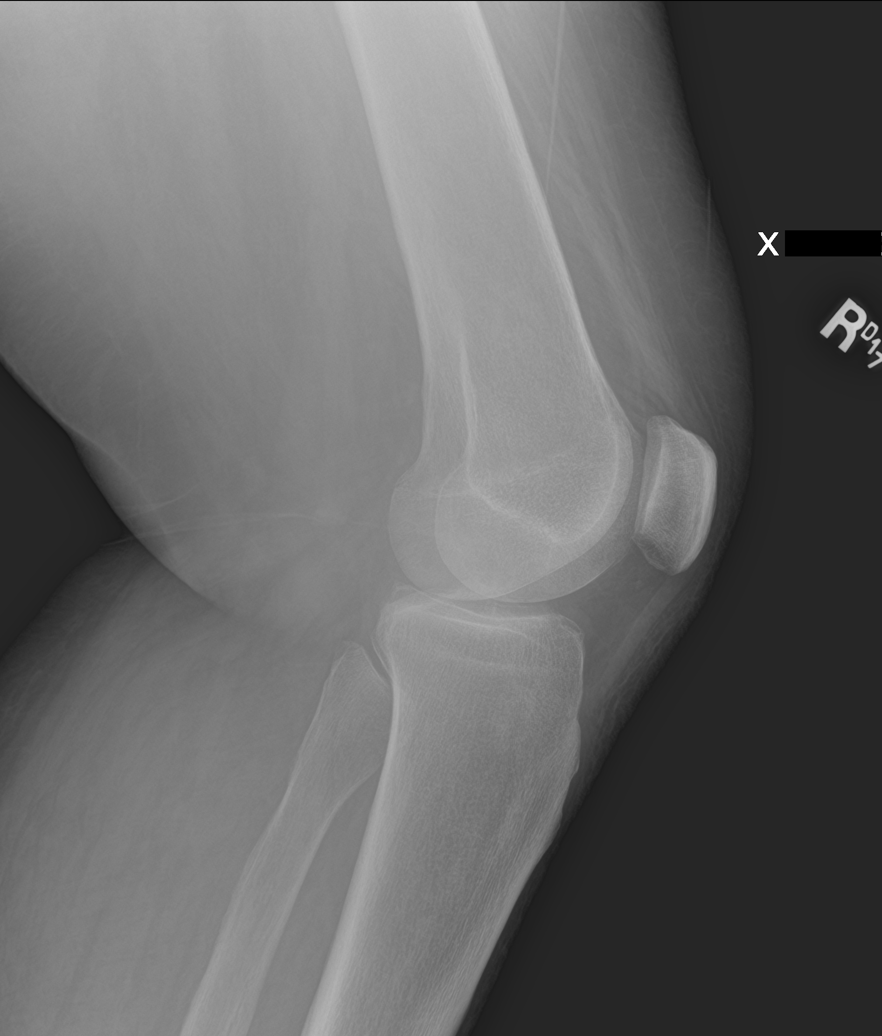

[4 of 4 positions shown; findings below may reference images not displayed]

FINDINGS: No evidence of fracture, dislocation, or joint effusion. No evidence
of arthropathy or other focal bone abnormality. Soft tissues are
unremarkable.
IMPRESSION: Negative.

## 2020-12-09 ENCOUNTER — Encounter: Payer: Self-pay | Admitting: Family Medicine

## 2020-12-09 ENCOUNTER — Other Ambulatory Visit: Payer: Self-pay

## 2020-12-09 ENCOUNTER — Ambulatory Visit (INDEPENDENT_AMBULATORY_CARE_PROVIDER_SITE_OTHER): Payer: Medicare Other | Admitting: Family Medicine

## 2020-12-09 VITALS — BP 154/92 | HR 88 | Ht 72.0 in | Wt 376.0 lb

## 2020-12-09 DIAGNOSIS — G894 Chronic pain syndrome: Secondary | ICD-10-CM

## 2020-12-09 DIAGNOSIS — M5136 Other intervertebral disc degeneration, lumbar region: Secondary | ICD-10-CM | POA: Diagnosis not present

## 2020-12-09 DIAGNOSIS — Z794 Long term (current) use of insulin: Secondary | ICD-10-CM

## 2020-12-09 DIAGNOSIS — M159 Polyosteoarthritis, unspecified: Secondary | ICD-10-CM | POA: Diagnosis not present

## 2020-12-09 DIAGNOSIS — M25551 Pain in right hip: Secondary | ICD-10-CM

## 2020-12-09 DIAGNOSIS — L602 Onychogryphosis: Secondary | ICD-10-CM | POA: Diagnosis not present

## 2020-12-09 DIAGNOSIS — E1169 Type 2 diabetes mellitus with other specified complication: Secondary | ICD-10-CM | POA: Diagnosis not present

## 2020-12-09 DIAGNOSIS — G8929 Other chronic pain: Secondary | ICD-10-CM | POA: Diagnosis not present

## 2020-12-09 LAB — POCT GLYCOSYLATED HEMOGLOBIN (HGB A1C): Hemoglobin A1C: 5.7 % — AB (ref 4.0–5.6)

## 2020-12-09 MED ORDER — OZEMPIC (0.25 OR 0.5 MG/DOSE) 2 MG/1.5ML ~~LOC~~ SOPN: 0.2500 mg | PEN_INJECTOR | SUBCUTANEOUS | 0 refills | Status: DC

## 2020-12-09 MED ORDER — OXYCODONE-ACETAMINOPHEN 10-325 MG PO TABS: 1.0000 | ORAL_TABLET | Freq: Two times a day (BID) | ORAL | 0 refills | Status: DC | PRN

## 2020-12-09 NOTE — Progress Notes (Signed)
Subjective:    Patient ID: James [name], male    DOB: September 15, 1960, 60 y.o.   MRN: [id number]  James [name] is a 60 y.o. male presenting on 12/09/2020 for Diabetes   HPI  Type 2 Diabetes Due for A1c today He has reduced [name] insulin to 25u now, doing better, on Metformin 1000 BID He says CBG controlled Previously on Victoza His brother is on ozempic Needs diabetic eye exam  Chronic Pain Syndrome Lumbar DDD, Chronic Hip Pain / Shoulder Previously managed by Pain Management in [place]. Waiting on new apt with pain management here in [place], we referred last visit.  He is on Lyrica 17m BID Has Oxycodone 10/325 BID PRN still need srefill today - some days will skip dose.   He uses walker for ambulation, he has difficulty with changing position from sitting to standing at times depending on chair he is in.   No flowsheet data found.  Social History   Tobacco Use   Smoking status: Never   Smokeless tobacco: Never    Review of Systems Per HPI unless specifically indicated above     Objective:    BP (!) 154/92   Pulse 88   Ht 6' (1.829 m)   Wt (!) 376 lb (170.6 kg)   SpO2 96%   BMI 50.99 kg/m   Wt Readings from Last 3 Encounters:  12/09/20 (!) 376 lb (170.6 kg)  11/09/20 (!) 367 lb (166.5 kg)  10/30/20 (!) 375 lb (170.1 kg)    Physical Exam   Diabetic Foot Exam - Simple   Simple Foot Form Diabetic Foot exam was performed with the following findings: Yes 12/09/2020 11:10 AM  Visual Inspection See comments: Yes Sensation Testing Intact to touch and monofilament testing bilaterally: Yes Pulse Check Posterior Tibialis and Dorsalis pulse intact bilaterally: Yes Comments Overgrown toenails bilateral, callus without ulceration.     Results for orders placed or performed in visit on 12/09/20  POCT HgB A1C  Result Value Ref Range   Hemoglobin A1C 5.7 (A) 4.0 - 5.6 %      Assessment & Plan:   Problem List Items Addressed This Visit      Primary osteoarthritis involving multiple joints   Relevant Medications   oxyCODONE-acetaminophen (PERCOCET) 10-325 MG tablet   Diabetes mellitus ([place] - Primary   Relevant Medications   insulin degludec ([name]) 100 UNIT/ML FlexTouch Pen   OZEMPIC, 0.25 OR 0.5 MG/DOSE, 2 MG/1.5ML SOPN   Other Relevant Orders   POCT HgB A1C (Completed)   DDD (degenerative disc disease), lumbar   Relevant Medications   oxyCODONE-acetaminophen (PERCOCET) 10-325 MG tablet   Chronic pain syndrome   Relevant Medications   oxyCODONE-acetaminophen (PERCOCET) 10-325 MG tablet   Other Visit Diagnoses     Overgrown toenails       Relevant Orders   Ambulatory referral to Podiatry   Chronic right hip pain       Relevant Medications   oxyCODONE-acetaminophen (PERCOCET) 10-325 MG tablet       Chronic Pain Syndrome OA/DJD Lumbar DDD Hip Shoulder See previous initial new patient visit note. He was referred to Pain Management, apt in Feb 2023, not scheduled yet on my end Agree to continue management of his opiate pain medication oxycodone 10/325 BID PRN #60 rx sent today for 30 days and can refill bridge until he sees pain management.  Continue Pregabalin lyrica 1582mBID, has refills already.  T2DM Improved, well controlled, A1c 5.7 range Continue [name] 25u - may reduce  to 20 when improved on higher dose [name] Add Ozempic 0.49m weekly x 4 week sample dose inc up to 0.516mx 2 weeks, notify usKoreaf/when ready for new rx - goal for wt loss and sugar management reduce   Toenails overgrown Referral to Podiatry sent.  DME request - handwritten rx for Rollator with seat given today.  It is my medical opinion that patient will benefit from DME rollator walker with wheels and seat due to his chronic hip and back pain. He has limited mobility and this walker would assist him in ambulating to locations he needs and can function daily. He has demonstrated proper upper body strength to maneuver the  walker safely. His needs are not met with cane alone.  Orders Placed This Encounter  Procedures   Ambulatory referral to Podiatry    Referral Priority:   Routine    Referral Type:   Consultation    Referral Reason:   Specialty Services Required    Requested Specialty:   Podiatry    Number of Visits Requested:   1   POCT HgB A1C     Meds ordered this encounter  Medications   oxyCODONE-acetaminophen (PERCOCET) 10-325 MG tablet    Sig: Take 1 tablet by mouth 2 (two) times daily as needed for pain.    Dispense:  60 tablet    Refill:  0   OZEMPIC, 0.25 OR 0.5 MG/DOSE, 2 MG/1.5ML SOPN    Sig: Inject 0.25 mg into the skin once a week. For first 4 weeks. Then increase dose to 0.73m673meekly    Dispense:  2 mL    Refill:  0       Follow up plan: Return in about 4 weeks (around 01/06/2021) for 4 week follow-up Virtual Telephone Visit Pain Med Refill.   AleNobie PutnamO [facility] 12/09/2020, 11:07 AM

## 2020-12-09 NOTE — Patient Instructions (Addendum)
Thank you for coming to the office today.  Refilled Oxycodone for pain for 4 weeks  Stay tuned for Pain Management.  Continue Metformin  Stop Victoza Continue [name] 25 u eventually down to 20u    Ozempic (Semaglutide injection) - start 0.25mg  weekly for 4 weeks then increase to 0.5mg  weekly  3 benefits - 1 significantly reduced A1c sugar, and may be able to reduce or stop metformin in future - 2 reduced appetite and weight loss with good results - 3 cardiovascular risk reduction, less likely to have heart attack/stroke    Your provider would like to you have your annual eye exam. Please contact your current eye doctor or here are some good options for you to contact.   [facility]   Address: [address] [place], [place] [address] Phone: [phone]  Website: [email or web address] [address], [place], [place] [address] Phone: [phone] [email or web address]  [facility]  Address: [place], [place], [place] [address] Phone: [phone]   [facility] [address] [place], [place] [address] Phone: [phone]  [facility] Address: [place], [place], [place] [address]  Phone: [phone]   Please schedule a Follow-up Appointment to: Return in about 4 weeks (around 01/06/2021) for 4 week follow-up Virtual Telephone Visit Pain Med Refill.  If you have any other questions or concerns, please feel free to call the office or send a message through [place]. You may also schedule an earlier appointment if necessary.  Additionally, you may be receiving a survey about your experience at our office within a few days to 1 week by e-mail or mail. We value your feedback.  Nobie Putnam, DO [place]

## 2020-12-22 ENCOUNTER — Ambulatory Visit: Payer: Self-pay | Admitting: Family Medicine

## 2020-12-29 ENCOUNTER — Other Ambulatory Visit: Payer: Self-pay | Admitting: Family Medicine

## 2020-12-29 DIAGNOSIS — M5136 Other intervertebral disc degeneration, lumbar region: Secondary | ICD-10-CM

## 2020-12-29 DIAGNOSIS — G8929 Other chronic pain: Secondary | ICD-10-CM

## 2020-12-29 DIAGNOSIS — G894 Chronic pain syndrome: Secondary | ICD-10-CM

## 2020-12-29 DIAGNOSIS — M25551 Pain in right hip: Secondary | ICD-10-CM

## 2020-12-29 DIAGNOSIS — M159 Polyosteoarthritis, unspecified: Secondary | ICD-10-CM

## 2020-12-29 NOTE — Telephone Encounter (Signed)
Requested medication (s) are due for refill today: no  Requested medication (s) are on the active medication list: yes  Last refill:  12/09/20 #60/0RF  Future visit scheduled: yes  Notes to clinic:  Unable to refill per protocol, cannot delegate. No urine drug screen on file      Requested Prescriptions  Pending Prescriptions Disp Refills   oxyCODONE-acetaminophen (PERCOCET) 10-325 MG tablet 60 tablet 0    Sig: Take 1 tablet by mouth 2 (two) times daily as needed for pain.     Not Delegated - Analgesics:  Opioid Agonist Combinations Failed - 12/29/2020  2:28 PM      Failed - This refill cannot be delegated      Failed - Urine Drug Screen completed in last 360 days      Passed - Valid encounter within last 6 months    Recent Outpatient Visits           2 weeks ago Type 2 diabetes mellitus with other specified complication, with long-term current use of insulin [facility])   [place], DO   1 month ago Chronic pain syndrome   [place], DO       Future Appointments             In 3 weeks Juline Patch, MD [facility]

## 2020-12-29 NOTE — Telephone Encounter (Signed)
Medication Refill - Medication:oxyCODONE-acetaminophen (PERCOCET) 10-325 MG tablet  Has the patient contacted their pharmacy? yes (Agent: If no, request that the patient contact the pharmacy for the refill. If patient does not wish to contact the pharmacy document the reason why and proceed with request.) (Agent: If yes, when and what did the pharmacy advise?)contact pcp  Preferred Pharmacy (with phone number or street name): [id number] - [place], [place]. [address] Phone:  [phone]  Fax:  [phone]     Has the patient been seen for an appointment in the last year OR does the patient have an upcoming appointment? yes  Agent: Please be advised that RX refills may take up to 3 business days. We ask that you follow-up with your pharmacy.

## 2020-12-30 ENCOUNTER — Ambulatory Visit (INDEPENDENT_AMBULATORY_CARE_PROVIDER_SITE_OTHER): Payer: Medicare Other | Admitting: Podiatry

## 2020-12-30 ENCOUNTER — Encounter: Payer: Self-pay | Admitting: Podiatry

## 2020-12-30 ENCOUNTER — Other Ambulatory Visit: Payer: Self-pay

## 2020-12-30 DIAGNOSIS — B351 Tinea unguium: Secondary | ICD-10-CM

## 2020-12-30 DIAGNOSIS — M7989 Other specified soft tissue disorders: Secondary | ICD-10-CM | POA: Diagnosis not present

## 2020-12-30 DIAGNOSIS — M79675 Pain in left toe(s): Secondary | ICD-10-CM | POA: Diagnosis not present

## 2020-12-30 DIAGNOSIS — R6889 Other general symptoms and signs: Secondary | ICD-10-CM | POA: Diagnosis not present

## 2020-12-30 DIAGNOSIS — M79674 Pain in right toe(s): Secondary | ICD-10-CM

## 2020-12-30 MED ORDER — OXYCODONE-ACETAMINOPHEN 10-325 MG PO TABS: 1.0000 | ORAL_TABLET | Freq: Two times a day (BID) | ORAL | 0 refills | Status: DC | PRN

## 2021-01-04 ENCOUNTER — Encounter: Payer: Self-pay | Admitting: Podiatry

## 2021-01-04 NOTE — Progress Notes (Signed)
°  Subjective:  Patient ID: James [name], male    DOB: 04-Nov-1960,  MRN: [id number]  Chief Complaint  Patient presents with   Nail Problem    NP  thick painful toenails, diabetic foot care    60 y.o. male presents with the above complaint. History confirmed with patient.  Nails are thickened elongated and causes pain in shoe gear.  He has occasional pain on the top of the left foot that radiates.  He is going to pain management here.  He recent moved here from [place].  His A1c is 5.7%.  Objective:  Physical Exam: warm, good capillary refill, no trophic changes or ulcerative lesions, normal DP and PT pulses, normal monofilament exam, and normal sensory exam. Left Foot: dystrophic yellowed discolored nail plates with subungual debris Right Foot: dystrophic yellowed discolored nail plates with subungual debris  Assessment:   1. Swelling of left foot   2. Pain due to onychomycosis of toenails of both feet      Plan:  Patient was evaluated and treated and all questions answered.  The pain and tenderness he has with the swelling on the top of his left foot, my chief would be concerned for gout he has no previous history of this.  I have ordered a uric acid level to evaluate for this    Patient educated on diabetes. Discussed proper diabetic foot care and discussed risks and complications of disease. Educated patient in depth on reasons to return to the office immediately should he/she discover anything concerning or new on the feet. All questions answered. Discussed proper shoes as well.   Discussed the etiology and treatment options for the condition in detail with the patient. Educated patient on the topical and oral treatment options for mycotic nails. Recommended debridement of the nails today. Sharp and mechanical debridement performed of all painful and mycotic nails today. Nails debrided in length and thickness using a nail nipper to level of comfort. Discussed treatment  options including appropriate shoe gear. Follow up as needed for painful nails.     Return in about 3 months (around 03/30/2021) for at risk diabetic foot care.

## 2021-01-06 ENCOUNTER — Other Ambulatory Visit: Payer: Self-pay

## 2021-01-06 ENCOUNTER — Ambulatory Visit (INDEPENDENT_AMBULATORY_CARE_PROVIDER_SITE_OTHER): Payer: Medicare Other | Admitting: Family Medicine

## 2021-01-06 ENCOUNTER — Encounter: Payer: Self-pay | Admitting: Family Medicine

## 2021-01-06 VITALS — Ht 72.0 in | Wt 376.0 lb

## 2021-01-06 DIAGNOSIS — M25551 Pain in right hip: Secondary | ICD-10-CM | POA: Diagnosis not present

## 2021-01-06 DIAGNOSIS — G894 Chronic pain syndrome: Secondary | ICD-10-CM

## 2021-01-06 DIAGNOSIS — M159 Polyosteoarthritis, unspecified: Secondary | ICD-10-CM

## 2021-01-06 DIAGNOSIS — M5136 Other intervertebral disc degeneration, lumbar region: Secondary | ICD-10-CM

## 2021-01-06 DIAGNOSIS — E1169 Type 2 diabetes mellitus with other specified complication: Secondary | ICD-10-CM | POA: Diagnosis not present

## 2021-01-06 DIAGNOSIS — Z794 Long term (current) use of insulin: Secondary | ICD-10-CM | POA: Diagnosis not present

## 2021-01-06 DIAGNOSIS — G8929 Other chronic pain: Secondary | ICD-10-CM | POA: Diagnosis not present

## 2021-01-06 MED ORDER — OXYCODONE-ACETAMINOPHEN 10-325 MG PO TABS: 1.0000 | ORAL_TABLET | Freq: Three times a day (TID) | ORAL | 0 refills | Status: DC | PRN

## 2021-01-06 MED ORDER — OZEMPIC (0.25 OR 0.5 MG/DOSE) 2 MG/1.5ML ~~LOC~~ SOPN: 0.5000 mg | PEN_INJECTOR | SUBCUTANEOUS | 1 refills | Status: DC

## 2021-01-06 NOTE — Progress Notes (Signed)
Virtual Visit via Telephone The purpose of this virtual visit is to provide medical care while limiting exposure to the novel coronavirus (COVID19) for both patient and office staff.  Consent was obtained for phone visit:  Yes.   Answered questions that patient had about telehealth interaction:  Yes.   I discussed the limitations, risks, security and privacy concerns of performing an evaluation and management service by telephone. I also discussed with the patient that there may be a patient responsible charge related to this service. The patient expressed understanding and agreed to proceed.  Patient Location: Home Provider Location: [facility] (Office)  Participants in virtual visit: - Patient: James [name] - CMA: Orinda Kenner, [place] - Provider: Dr Parks Ranger  ---------------------------------------------------------------------- Chief Complaint  Patient presents with   Pain    S: Reviewed CMA documentation. I have called patient and gathered additional HPI as follows:  Type 2 Diabetes He has reduced [name] insulin to 25u now, doing better, on Metformin 1000 BID He says CBG controlled Today he has requested Ozempic rx, doing well with wt loss due to inc to 0.5mg  weekly dose Previously on Victoza His brother is on ozempic   Chronic Pain Syndrome Lumbar DDD, Chronic Hip Pain / Shoulder Previously managed by Pain Management in [place]. New visit pain management in end of January 2023.   He is on Lyrica 150mg  BID Has Oxycodone 10/325 BID PRN taking 2-3 times per day with relief, needs new order, out of pill count earlier would benefit from up to max of TID dosing, similar to previous history of pain management prior to establish here.  Denies any fevers, chills, sweats, body ache, cough, shortness of breath, sinus pain or pressure, headache, abdominal pain, diarrhea  Past Medical History:  Diagnosis Date   Arthritis    Diabetes mellitus without  complication ([place])    Hyperlipidemia    Hypertension    Social History   Tobacco Use   Smoking status: Never   Smokeless tobacco: Never    Current Outpatient Medications:    atorvastatin (LIPITOR) 40 MG tablet, Take 1 tablet (40 mg total) by mouth daily., Disp: 30 tablet, Rfl: 11   hydrochlorothiazide (HYDRODIURIL) 25 MG tablet, Take 1 tablet (25 mg total) by mouth daily., Disp: 60 tablet, Rfl: 1   insulin degludec ([name]) 100 UNIT/ML FlexTouch Pen, Inject 25 Units into the skin daily., Disp: 3 mL, Rfl: 1   losartan (COZAAR) 25 MG tablet, Take 1 tablet (25 mg total) by mouth daily., Disp: 30 tablet, Rfl: 11   metFORMIN (GLUCOPHAGE) 1000 MG tablet, Take 1 tablet (1,000 mg total) by mouth daily with breakfast., Disp: 90 tablet, Rfl: 1   OZEMPIC, 0.25 OR 0.5 MG/DOSE, 2 MG/1.5ML SOPN, Inject 0.5 mg into the skin once a week., Disp: 4.5 mL, Rfl: 1   pregabalin (LYRICA) 150 MG capsule, Take 2 caps for 300mg  in AM, and take 1 cap for 150mg  at night, Disp: 90 capsule, Rfl: 2   oxyCODONE-acetaminophen (PERCOCET) 10-325 MG tablet, Take 1 tablet by mouth 3 (three) times daily as needed for pain., Disp: 90 tablet, Rfl: 0  No flowsheet data found.  No flowsheet data found.  -------------------------------------------------------------------------- O: No physical exam performed due to remote telephone encounter.  Lab results reviewed.  Recent Results (from the past 2160 hour(s))  CBG monitoring, ED     Status: Abnormal   Collection Time: 10/30/20 11:27 AM  Result Value Ref Range   Glucose-Capillary 112 (H) 70 - 99 mg/dL  Comment: Glucose reference range applies only to samples taken after fasting for at least 8 hours.  CBC     Status: Abnormal   Collection Time: 10/30/20 11:28 AM  Result Value Ref Range   WBC 10.2 4.0 - 10.5 K/uL   RBC 4.70 4.22 - 5.81 MIL/uL   Hemoglobin 14.4 13.0 - 17.0 g/dL   HCT 43.4 39.0 - 52.0 %   MCV 92.3 80.0 - 100.0 fL   MCH 30.6 26.0 - 34.0 pg   MCHC 33.2  30.0 - 36.0 g/dL   RDW 17.1 (H) 11.5 - 15.5 %   Platelets 453 (H) 150 - 400 K/uL   nRBC 0.0 0.0 - 0.2 %    Comment: Performed at [facility], [address] [place], [place] [address]  Comprehensive metabolic panel     Status: Abnormal   Collection Time: 10/30/20 11:28 AM  Result Value Ref Range   Sodium 140 135 - 145 mmol/L   Potassium 3.4 (L) 3.5 - 5.1 mmol/L   Chloride 99 98 - 111 mmol/L   CO2 31 22 - 32 mmol/L   Glucose, Bld 110 (H) 70 - 99 mg/dL    Comment: Glucose reference range applies only to samples taken after fasting for at least 8 hours.   BUN 8 6 - 20 mg/dL   Creatinine, Ser 0.78 0.61 - 1.24 mg/dL   Calcium 8.7 (L) 8.9 - 10.3 mg/dL   Total Protein 6.6 6.5 - 8.1 g/dL   Albumin 3.4 (L) 3.5 - 5.0 g/dL   AST 10 (L) 15 - 41 U/L   ALT 6 0 - 44 U/L   Alkaline Phosphatase 89 38 - 126 U/L   Total Bilirubin 0.6 0.3 - 1.2 mg/dL   GFR, Estimated >60 >60 mL/min    Comment: (NOTE) Calculated using the CKD-EPI Creatinine Equation (2021)    Anion gap 10 5 - 15    Comment: Performed at [facility], [address] [place], [place] [address]  POCT HgB A1C     Status: Abnormal   Collection Time: 12/09/20 11:15 AM  Result Value Ref Range   Hemoglobin A1C 5.7 (A) 4.0 - 5.6 %    -------------------------------------------------------------------------- A&P:  Problem List Items Addressed This Visit     Primary osteoarthritis involving multiple joints   Relevant Medications   oxyCODONE-acetaminophen (PERCOCET) 10-325 MG tablet   Diabetes mellitus (HCC)   Relevant Medications   OZEMPIC, 0.25 OR 0.5 MG/DOSE, 2 MG/1.5ML SOPN   DDD (degenerative disc disease), lumbar   Relevant Medications   oxyCODONE-acetaminophen (PERCOCET) 10-325 MG tablet   Chronic pain syndrome - Primary   Relevant Medications   oxyCODONE-acetaminophen (PERCOCET) 10-325 MG tablet   Other Visit Diagnoses     Chronic right hip pain       Relevant Medications    oxyCODONE-acetaminophen (PERCOCET) 10-325 MG tablet      DM2 Improved wt loss and CBG control Continue other meds Order Ozempic 0.5mg  weekly inj, 90 day  Future adjust insulin with next A1c  Chronic Pain Syndrome OA/DJD Lumbar DDD Hip Shoulder See previous initial new patient visit note New patient apt [facility] 02/15/21 Agree to continue management of his opiate pain medication oxycodone 10/325 BID PRN #90 rx sent today for 30 days and can refill bridge until he sees pain management.   Next visit Power Mobility Device assessment / order form [employer].  Meds ordered this encounter  Medications   oxyCODONE-acetaminophen (PERCOCET) 10-325 MG tablet    Sig:  Take 1 tablet by mouth 3 (three) times daily as needed for pain.    Dispense:  90 tablet    Refill:  0   OZEMPIC, 0.25 OR 0.5 MG/DOSE, 2 MG/1.5ML SOPN    Sig: Inject 0.5 mg into the skin once a week.    Dispense:  4.5 mL    Refill:  1    84 day supply / 3 month    Follow-up: - Return in 4 weeks  Patient verbalizes understanding with the above medical recommendations including the limitation of remote medical advice.  Specific follow-up and call-back criteria were given for patient to follow-up or seek medical care more urgently if needed.   - Time spent in direct consultation with patient on phone: 10 minutes   Nobie Putnam, [facility] 01/06/2021, 11:57 AM

## 2021-01-06 NOTE — Patient Instructions (Addendum)
° °  Please schedule a Follow-up Appointment to: Return in about 4 weeks (around 02/03/2021) for 4 weeks pain med refill / Power Wheelchair Mobility Assess.  If you have any other questions or concerns, please feel free to call the office or send a message through [place]. You may also schedule an earlier appointment if necessary.  Additionally, you may be receiving a survey about your experience at our office within a few days to 1 week by e-mail or mail. We value your feedback.  Nobie Putnam, DO [place]

## 2021-01-13 DIAGNOSIS — R6889 Other general symptoms and signs: Secondary | ICD-10-CM | POA: Diagnosis not present

## 2021-01-14 ENCOUNTER — Telehealth: Payer: Self-pay

## 2021-01-14 NOTE — Telephone Encounter (Signed)
Called pt concerning upcoming appt- left message to return call

## 2021-01-15 ENCOUNTER — Other Ambulatory Visit: Payer: Self-pay

## 2021-01-15 ENCOUNTER — Ambulatory Visit (INDEPENDENT_AMBULATORY_CARE_PROVIDER_SITE_OTHER): Payer: Medicare Other | Admitting: Family Medicine

## 2021-01-15 ENCOUNTER — Encounter: Payer: Self-pay | Admitting: Family Medicine

## 2021-01-15 VITALS — BP 134/84 | HR 84 | Temp 98.2°F | Ht 72.0 in | Wt 373.0 lb

## 2021-01-15 DIAGNOSIS — M25551 Pain in right hip: Secondary | ICD-10-CM

## 2021-01-15 DIAGNOSIS — M25552 Pain in left hip: Secondary | ICD-10-CM

## 2021-01-15 DIAGNOSIS — M51369 Other intervertebral disc degeneration, lumbar region without mention of lumbar back pain or lower extremity pain: Secondary | ICD-10-CM

## 2021-01-15 DIAGNOSIS — R6889 Other general symptoms and signs: Secondary | ICD-10-CM | POA: Diagnosis not present

## 2021-01-15 DIAGNOSIS — M5136 Other intervertebral disc degeneration, lumbar region: Secondary | ICD-10-CM | POA: Diagnosis not present

## 2021-01-15 DIAGNOSIS — R29898 Other symptoms and signs involving the musculoskeletal system: Secondary | ICD-10-CM | POA: Diagnosis not present

## 2021-01-15 DIAGNOSIS — G8929 Other chronic pain: Secondary | ICD-10-CM | POA: Diagnosis not present

## 2021-01-15 DIAGNOSIS — G894 Chronic pain syndrome: Secondary | ICD-10-CM

## 2021-01-15 DIAGNOSIS — Z6841 Body Mass Index (BMI) 40.0 and over, adult: Secondary | ICD-10-CM | POA: Insufficient documentation

## 2021-01-15 DIAGNOSIS — M150 Primary generalized (osteo)arthritis: Secondary | ICD-10-CM

## 2021-01-15 DIAGNOSIS — R296 Repeated falls: Secondary | ICD-10-CM

## 2021-01-15 DIAGNOSIS — M159 Polyosteoarthritis, unspecified: Secondary | ICD-10-CM | POA: Diagnosis not present

## 2021-01-15 NOTE — Progress Notes (Signed)
Subjective:    Patient ID: James [name], male    DOB: Mar 16, 1960, 60 y.o.   MRN: [id number]  James [name] is a 60 y.o. male presenting on 01/15/2021 for Consult for Hoveround  and Back Pain   HPI   Chief Complaint: MOBILITY EXAMINATION   Change to now require power mobility device (PMD) - Worsening lower extremity weakness due to chronic low back pain with osteoarthritis degenerative lumbar disc disease. He has had recurrent falls. He has limited ability to get around the house to different rooms to kitchen, bedroom, bathroom. Also outside of house limited mobility.   Transfer Status He can transfer from seated to standing and standing to seated unassisted.     Depression screen [facility] 2/9 01/15/2021  Decreased Interest 0  Down, Depressed, Hopeless 0  PHQ - 2 Score 0  Altered sleeping 2  Tired, decreased energy 2  Change in appetite 2  Feeling bad or failure about yourself  0  Trouble concentrating 0  Moving slowly or fidgety/restless 0  Suicidal thoughts 0  PHQ-9 Score 6  Difficult doing work/chores Not difficult at all    Social History   Tobacco Use   Smoking status: Some Days    Packs/day: 0.25    Types: Cigarettes   Smokeless tobacco: Never    Review of Systems Per HPI unless specifically indicated above     Objective:    BP 134/84 (BP Location: Left Arm, Cuff Size: Normal)    Pulse 84    Temp 98.2 F (36.8 C) (Oral)    Ht 6' (1.829 m)    Wt (!) 373 lb (169.2 kg)    SpO2 96%    BMI 50.59 kg/m   Wt Readings from Last 3 Encounters:  01/15/21 (!) 373 lb (169.2 kg)  01/06/21 (!) 376 lb (170.6 kg)  12/09/20 (!) 376 lb (170.6 kg)    Physical Exam Vitals and nursing note reviewed.  Constitutional:      General: He is not in acute distress.    Appearance: He is well-developed. He is obese. He is not diaphoretic.     Comments: Well-appearing, uncomfortable with back pain, cooperative  HENT:     Head: Normocephalic and atraumatic.  Eyes:     General:         Right eye: No discharge.        Left eye: No discharge.     Conjunctiva/sclera: Conjunctivae normal.  Neck:     Thyroid: No thyromegaly.  Cardiovascular:     Rate and Rhythm: Normal rate and regular rhythm.     Pulses: Normal pulses.     Heart sounds: Normal heart sounds. No murmur heard. Pulmonary:     Effort: Pulmonary effort is normal. No respiratory distress.     Breath sounds: Normal breath sounds. No wheezing or rales.  Musculoskeletal:     Cervical back: Normal range of motion and neck supple.     Right lower leg: Edema present.     Left lower leg: Edema present.     Comments: See separate MSK exam  Lymphadenopathy:     Cervical: No cervical adenopathy.  Skin:    General: Skin is warm and dry.     Findings: No erythema or rash.  Neurological:     Mental Status: He is alert and oriented to person, place, and time. Mental status is at baseline.  Psychiatric:        Behavior: Behavior normal.     Comments: Well groomed, good  eye contact, normal speech and thoughts   Height = 6\' 0"  Weight = 373 lbs   -  O2 Saturation = 96% on room air - Edema = positive for some episodic edema in Left lower extremity - Pressure Sores = None actively - Ability to Shift Weight = Able to shift weight, and he is able to transfer to chair or mobility device.   Medical Exam - Cardiac - Hypertension, on medication currently. Has some mild edema in lower extremity - Pulmonary - No known pulmonary problem. Good air movement - Musculoskeletal - Low back with muscle spasm hypertonicity and limited range of motion with hips and lower extremity due to back pain and degenerative disc disease. He has prior history of left foot fracture with chronic pain in foot. - Neurological - No documented neuropathy with diabetes - Ambulatory Exam - Limited, due to pain in back, hips, and stability.   5. Upper and Lower Extremity Assessment RUE - Strength - 4 out of 5 - Pain - 2 out of 10 - Range of  Motion - Mostly full range   LUE - Strength - 3 out of 5 - Pain - 4 out of 10 - Range of Motion - Reduced forward flexion and abduction   RLE - Strength 2 out of 5 - Pain - 10 out of 10 - Range of Motion - very limited due to pain on hip flexion, but has moderately preserved knee flexion.   LLE - Strength - 2 out of 5 - Pain - 10 out of 10 - Range of Motion - very limited due to pain on hip flexion, but has moderately preserved knee flexion.   - Gait Pattern: antalgic gait due to hip pain   ----------------------------------------------------   Results for orders placed or performed in visit on 12/09/20  POCT HgB A1C  Result Value Ref Range   Hemoglobin A1C 5.7 (A) 4.0 - 5.6 %      Assessment & Plan:   Problem List Items Addressed This Visit     Weakness of both lower extremities   Recurrent falls   Primary osteoarthritis involving multiple joints   Morbid obesity with BMI of 50.0-59.9, adult ([place])   DDD (degenerative disc disease), lumbar - Primary   Chronic pain syndrome   Other Visit Diagnoses     Chronic hip pain, bilateral           Medical Conditions: - The primary medical condition that impacts patient's mobility need is his status of osteoarthritis lumbar degenerative disc disease, chronic pain in back and hips and legs, he has morbid obesity and recurrent falls. These conditions have caused a generalized weakness worse in lower extremities worsening.   MRADLs impaired in the home include: - PMD is necessary for patient's mobility to get to the bathroom for routine toilet use. - PMD is necessary for patient's mobility to get to the kitchen to prepare meals - PMD is necessary for patient's mobility to get to the bedroom to dress and sleep   Cane or Walker Patient cannot use a cane / walker due to his status of low back pain and weakness, he has had difficulty with worsening weakness now causing him to fall when attempting to use walker. [name] is not enough  support for weight with his ambulation. Weakness with upper extremity limits his ability to walk.   Manual Wheelchair Patient cannot use MWC due to generalized weakness limiting his ability to properly and safely use this device to propel movement.  His weakness as listed above with LUE 3 out of 5 would limit him.   Scooter (POV) Patient cannot use a POV due to lack of postural stability, with generalized weakness in upper and lower extremities.   Patient can safely operate the power mobility device physically. He is mentally capable to operate the device.   Patient is willing and motivated to use the power mobility device in his home to improve his quality of life.   He will be using the equipment in the home and in the community, such as outside and stores.  See attached order with requested paperwork to be faxed to [facility] after completion.   No orders of the defined types were placed in this encounter.     Follow up plan: Return if symptoms worsen or fail to improve.  Nobie Putnam, DO [facility] 01/15/2021, 2:01 PM

## 2021-01-15 NOTE — Patient Instructions (Addendum)
Will fax the paperwork to [facility] stay tuned for updates  Please schedule a Follow-up Appointment to: Return if symptoms worsen or fail to improve.  If you have any other questions or concerns, please feel free to call the office or send a message through [place]. You may also schedule an earlier appointment if necessary.  Additionally, you may be receiving a survey about your experience at our office within a few days to 1 week by e-mail or mail. We value your feedback.  Nobie Putnam, DO [place]

## 2021-01-21 ENCOUNTER — Ambulatory Visit: Payer: Self-pay | Admitting: Family Medicine

## 2021-02-03 ENCOUNTER — Ambulatory Visit: Payer: Medicare Other | Admitting: Family Medicine

## 2021-02-04 ENCOUNTER — Other Ambulatory Visit: Payer: Self-pay | Admitting: Family Medicine

## 2021-02-04 DIAGNOSIS — I10 Essential (primary) hypertension: Secondary | ICD-10-CM

## 2021-02-04 NOTE — Telephone Encounter (Signed)
Requested medication (s) are due for refill today: yes  Requested medication (s) are on the active medication list: yes  Last refill:  10/30/20 #60 1 refills  Future visit scheduled: yes in 2 weeks   Notes to clinic:  last ordered by Merlyn Lot , MD. Do you want to refill Rx?     Requested Prescriptions  Pending Prescriptions Disp Refills   hydrochlorothiazide (HYDRODIURIL) 25 MG tablet 60 tablet 1    Sig: Take 1 tablet (25 mg total) by mouth daily.     Cardiovascular: Diuretics - Thiazide Failed - 02/04/2021  3:42 PM      Failed - Ca in normal range and within 360 days    Calcium  Date Value Ref Range Status  10/30/2020 8.7 (L) 8.9 - 10.3 mg/dL Final          Failed - K in normal range and within 360 days    Potassium  Date Value Ref Range Status  10/30/2020 3.4 (L) 3.5 - 5.1 mmol/L Final          Passed - Cr in normal range and within 360 days    Creatinine, Ser  Date Value Ref Range Status  10/30/2020 0.78 0.61 - 1.24 mg/dL Final          Passed - Na in normal range and within 360 days    Sodium  Date Value Ref Range Status  10/30/2020 140 135 - 145 mmol/L Final          Passed - Last BP in normal range    BP Readings from Last 1 Encounters:  01/15/21 134/84          Passed - Valid encounter within last 6 months    Recent Outpatient Visits           2 weeks ago DDD (degenerative disc disease), lumbar   [facility] Olin Hauser, DO   4 weeks ago Chronic pain syndrome   [facility] [place], Devonne Doughty, DO   1 month ago Type 2 diabetes mellitus with other specified complication, with long-term current use of insulin [facility])   [place], DO   2 months ago Chronic pain syndrome   [facility] [place], Devonne Doughty, DO       Future Appointments             In 2 weeks [place], Devonne Doughty, DO [facility]

## 2021-02-04 NOTE — Telephone Encounter (Signed)
Copied from [place] [phone]. Topic: Quick Communication - Rx Refill/Question >> Feb 04, 2021  2:30 PM Tessa Lerner A wrote: Medication: hydrochlorothiazide (HYDRODIURIL) 25 MG tablet [[id number]]   Has the patient contacted their pharmacy? Yes.  The patient has been directed to contact their PCP (Agent: If no, request that the patient contact the pharmacy for the refill. If patient does not wish to contact the pharmacy document the reason why and proceed with request.) (Agent: If yes, when and what did the pharmacy advise?)  Preferred Pharmacy (with phone number or street name): [id number] - [place], [place] [address] [place] [address] Phone: [phone] Fax: [phone]   Has the patient been seen for an appointment in the last year OR does the patient have an upcoming appointment? Yes.    Agent: Please be advised that RX refills may take up to 3 business days. We ask that you follow-up with your pharmacy.

## 2021-02-06 MED ORDER — HYDROCHLOROTHIAZIDE 25 MG PO TABS: 25.0000 mg | ORAL_TABLET | Freq: Every day | ORAL | 1 refills | Status: DC

## 2021-02-14 NOTE — Progress Notes (Signed)
Patient: James [name]  Service Category: E/M  Provider: Gaspar Cola, MD  DOB: [birth year]  DOS: 02/15/2021  Referring Provider: Nobie Putnam *  MRN: [id number]  Setting: Ambulatory outpatient  PCP: Olin Hauser, DO  Type: New Patient  Specialty: Interventional Pain Management    Location: Office  Delivery: Face-to-face     Primary Reason(s) for Visit: Encounter for initial evaluation of one or more chronic problems (new to examiner) potentially causing chronic pain, and posing a threat to normal musculoskeletal function. (Level of risk: High) CC: Leg Pain (Bilateral, right is worse), Back Pain (lower), and Shoulder Pain (left)  HPI  [name] is a 61 y.o. year old, male patient, who comes for the first time to our practice referred by Nobie Putnam * for our initial evaluation of his chronic pain. He has Chronic pain syndrome; DDD (degenerative disc disease), lumbar; Primary osteoarthritis involving multiple joints; Diabetes mellitus ([place]); Weakness of lower extremities (Bilateral); Morbid obesity with BMI of 50.0-59.9, adult ([place]); Recurrent falls; Pharmacologic therapy; Disorder of skeletal system; Problems influencing health status; Chronic use of opiate for therapeutic purpose; Type 2 diabetes mellitus with morbid obesity ([place]); Chronic lower extremity pain (1ry area of Pain) (Bilateral); Chronic low back pain (2ry area of Pain) (Bilateral) w/ sciatica (Bilateral); Chronic shoulder pain (3ry area of Pain) (Left); Chronic hip pain (Bilateral); and Chronic knee pain (Bilateral) on their problem list. Today he comes in for evaluation of his Leg Pain (Bilateral, right is worse), Back Pain (lower), and Shoulder Pain (left)  Pain Assessment: Location: Right, Left, Lower, Upper, Anterior, Posterior Leg Radiating: radioates from hips down legs Onset: More than a month ago Duration: Chronic pain Quality: Sharp, Stabbing, Aching Severity: 10-Worst pain ever/10  (subjective, self-reported pain score)  Effect on ADL: Limits activities Timing: Constant Modifying factors: nothing BP: 135/84   HR: 99  Onset and Duration: Date of onset: 10 years Cause of pain: Unknown Severity: Getting worse and NAS-11 at its worse: 10/10 Timing: Not influenced by the time of the day Aggravating Factors: Bending, Climbing, Kneeling, Lifiting, Prolonged sitting, Prolonged standing, Squatting, Stooping , Twisting, Walking, Walking uphill, and Walking downhill Alleviating Factors:  N/A Associated Problems: Night-time cramps, Pain that wakes patient up, and Pain that does not allow patient to sleep Quality of Pain: Aching, Disabling, Stabbing, and Uncomfortable Previous Examinations or Tests: Bone scan, EMG/PNCV, MRI scan, X-rays, Chiropractic evaluation, and Psychiatric evaluation Previous Treatments: Epidural steroid injections, Narcotic medications, Spinal cord stimulator, Steroid treatments by mouth, and Trigger point injections  According to the patient the primary area of pain is that of the lower extremities (Bilateral) (R>L).  He denies any surgeries, he does indicate having had some physical therapy in [place], but not only did not help, but made the pain worse.  The patient denies any recent x-rays.  He indicates having had nerve blocks done in [place], but no notes are available at this time.  He indicates having been to a pain clinic in [place]: Dr. Darlen Round T. Hagos.  [facility].  [address], [place] [address].  Telephone number [phone].  Fax number: [phone].  The distribution of the pain in the right lower extremity goes down through the lateral anterior aspect of the leg to the area of the ankle.  He refers that the pain is all around the leg.  In the case of the left lower extremity the pain goes all the way down to the top of  the foot and what appears to be an L5 dermatomal distribution.  He still  refers that pain to also be going down all around the leg.  The patient's second area of pain is the hips (Bilateral) (R>L).  The patient denies having had any kind of surgeries but again indicates having had physical therapy which did not help.  He does indicate having been told in [place] that he would be needing bilateral hip replacement.  There are no available x-rays or imaging studies of the patient.  He used to live in [place], but apparently moved to [place] approximately 4 to 5 months ago.  The patient's third area pain is that of the knees (Bilateral) (R>L).  Again the patient denies any prior surgeries but indicates having had physical therapy and he also indicates that he was also told that he would be needing knee replacements.  There are no imaging studies available at this time.  The patient has fourth area pain that of the lower back (Bilateral) (Midline) (R>L).  The patient indicates never having had back surgery, but does admit to having had physical therapy in [place] which not only did not help but made things worse.  The patient also indicates having had some x-rays done in [place], but none are available at this time.  The patient also indicates having had nerve blocks including having had "nerves burned".  He also indicates the pain to be referred towards his buttocks with the right buttock hurting more than the left.  The patient's fifth area pain is that of the left shoulder.  He points at his suprascapular region.  No prior surgeries or recent x-rays.  He denies any physical therapy or joint injections.  Physical exam shows a morbidly obese male with a weight of 375 pounds and using a walker to ambulate.  He is unable to stand up straight and has a lot of difficulty moving from the sitting position to standing with a walker.  He refers having quite a bit of lower extremity weakness on both sides.  He is also unable to tolerate standing straight.  He refers worsening of  the hip pain and leg pain when he stands up straight suggesting spinal stenosis.  Today he was unable to do any significant walking such as toe walking or heel walking.  He does seem to have weakness that could be associated with deconditioning.  Today I took the time to provide the patient with information regarding my pain practice. The patient was informed that my practice is divided into two sections: an interventional pain management section, as well as a completely separate and distinct medication management section. I explained that I have procedure days for my interventional therapies, and evaluation days for follow-ups and medication management. Because of the amount of documentation required during both, they are kept separated. This means that there is the possibility that he may be scheduled for a procedure on one day, and medication management the next. I have also informed him that because of staffing and facility limitations, I no longer take patients for medication management only. To illustrate the reasons for this, I gave the patient the example of surgeons, and how inappropriate it would be to refer a patient to his/her care, just to write for the post-surgical antibiotics on a surgery done by a different surgeon.   Because interventional pain management is my board-certified specialty, the patient was informed that joining my practice means that they are open to any and all  interventional therapies. I made it clear that this does not mean that they will be forced to have any procedures done. What this means is that I believe interventional therapies to be essential part of the diagnosis and proper management of chronic pain conditions. Therefore, patients not interested in these interventional alternatives will be better served under the care of a different practitioner.  The patient was also made aware of my Comprehensive Pain Management Safety Guidelines where by joining my practice, they  limit all of their nerve blocks and joint injections to those done by our practice, for as long as we are retained to manage their care.   Historic Controlled Substance Pharmacotherapy Review  PMP and historical list of controlled substances: Oxycodone/APAP 10/325 (# 60/month), 1 tab p.o. twice daily (last filled on 02/03/2021); pregabalin 150 mg capsule, 1 cap p.o. 3 times daily (number 90/month) Current opioid analgesics:   Oxycodone/APAP 10/325 (# 60/month), 1 tab p.o. twice daily (last filled on 02/03/2021) MME/day: 30 mg/day  Historical Monitoring: The patient  has no history on file for drug use. List of all UDS Test(s): No results found for: MDMA, COCAINSCRNUR, [place], [place], CANNABQUANT, THCU, [place] List of other Serum/Urine Drug Screening Test(s):  No results found for: AMPHSCRSER, BARBSCRSER, BENZOSCRSER, COCAINSCRSER, COCAINSCRNUR, PCPSCRSER, PCPQUANT, THCSCRSER, THCU, CANNABQUANT, OPIATESCRSER, OXYSCRSER, PROPOXSCRSER, ETH Historical Background Evaluation: [place] PMP: PDMP reviewed during this encounter. Online review of the past 17-monthperiod conducted.             PMP NARX Score Report:  Narcotic: 441 Sedative: 411 Stimulant: 000 [place] Department of public safety, offender search: (Editor, commissioningInformation) Non-contributory Risk Assessment Profile: Aberrant behavior: None observed or detected today Risk factors for fatal opioid overdose: None identified today PMP NARX Overdose Risk Score: 370 Fatal overdose hazard ratio (HR): Calculation deferred Non-fatal overdose hazard ratio (HR): Calculation deferred Risk of opioid abuse or dependence: 0.7-3.0% with doses ? 36 MME/day and 6.1-26% with doses ? 120 MME/day. Substance use disorder (SUD) risk level: See below Personal History of Substance Abuse (SUD-Substance use disorder):  Alcohol: Negative  Illegal Drugs: Negative  Rx Drugs: Negative  ORT Risk Level calculation: Low Risk  Opioid Risk Tool - 02/15/21 0912       Family  History of Substance Abuse   Alcohol Negative    Illegal Drugs Negative    Rx Drugs Negative      Personal History of Substance Abuse   Alcohol Negative    Illegal Drugs Negative    Rx Drugs Negative      Age   Age between 18-45years  No      History of Preadolescent Sexual Abuse   History of Preadolescent Sexual Abuse Negative or Male      Psychological Disease   Psychological Disease Negative    Depression Negative      Total Score   Opioid Risk Tool Scoring 0    Opioid Risk Interpretation Low Risk            ORT Scoring interpretation table:  Score <3 = Low Risk for SUD  Score between 4-7 = Moderate Risk for SUD  Score >8 = High Risk for Opioid Abuse   PHQ-2 Depression Scale:  Total score: 0  PHQ-2 Scoring interpretation table: (Score and probability of major depressive disorder)  Score 0 = No depression  Score 1 = 15.4% Probability  Score 2 = 21.1% Probability  Score 3 = 38.4% Probability  Score 4 = 45.5% Probability  Score 5 =  56.4% Probability  Score 6 = 78.6% Probability   PHQ-9 Depression Scale:  Total score: 0  PHQ-9 Scoring interpretation table:  Score 0-4 = No depression  Score 5-9 = Mild depression  Score 10-14 = Moderate depression  Score 15-19 = Moderately severe depression  Score 20-27 = Severe depression (2.4 times higher risk of SUD and 2.89 times higher risk of overuse)   Pharmacologic Plan: As per protocol, I have not taken over any controlled substance management, pending the results of ordered tests and/or consults.            Initial impression: Pending review of available data and ordered tests.  Meds   Current Outpatient Medications:    atorvastatin (LIPITOR) 40 MG tablet, Take 1 tablet (40 mg total) by mouth daily., Disp: 30 tablet, Rfl: 11   hydrochlorothiazide (HYDRODIURIL) 25 MG tablet, Take 1 tablet (25 mg total) by mouth daily., Disp: 90 tablet, Rfl: 1   insulin degludec ([name]) 100 UNIT/ML FlexTouch Pen, Inject 20 Units  into the skin daily., Disp: 3 mL, Rfl: 1   losartan (COZAAR) 25 MG tablet, Take 1 tablet (25 mg total) by mouth daily., Disp: 30 tablet, Rfl: 11   metFORMIN (GLUCOPHAGE) 1000 MG tablet, Take 1 tablet (1,000 mg total) by mouth daily with breakfast., Disp: 90 tablet, Rfl: 1   oxyCODONE-acetaminophen (PERCOCET) 10-325 MG tablet, Take 1 tablet by mouth 3 (three) times daily as needed for pain., Disp: 90 tablet, Rfl: 0   OZEMPIC, 0.25 OR 0.5 MG/DOSE, 2 MG/1.5ML SOPN, Inject 0.5 mg into the skin once a week., Disp: 4.5 mL, Rfl: 1   pregabalin (LYRICA) 150 MG capsule, Take 2 caps for 375m in AM, and take 1 cap for 1578mat night, Disp: 90 capsule, Rfl: 2  Imaging Review   Complexity Note: No results found under the [place].                        ROS  Cardiovascular: High blood pressure Pulmonary or Respiratory: Smoking Neurological: No reported neurological signs or symptoms such as seizures, abnormal skin sensations, urinary and/or fecal incontinence, being born with an abnormal open spine and/or a tethered spinal cord Psychological-Psychiatric: No reported psychological or psychiatric signs or symptoms such as difficulty sleeping, anxiety, depression, delusions or hallucinations (schizophrenial), mood swings (bipolar disorders) or suicidal ideations or attempts Gastrointestinal: No reported gastrointestinal signs or symptoms such as vomiting or evacuating blood, reflux, heartburn, alternating episodes of diarrhea and constipation, inflamed or scarred liver, or pancreas or irrregular and/or infrequent bowel movements Genitourinary: No reported renal or genitourinary signs or symptoms such as difficulty voiding or producing urine, peeing blood, non-functioning kidney, kidney stones, difficulty emptying the bladder, difficulty controlling the flow of urine, or chronic kidney disease Hematological: No reported hematological signs or symptoms such as prolonged bleeding,  low or poor functioning platelets, bruising or bleeding easily, hereditary bleeding problems, low energy levels due to low hemoglobin or being anemic Endocrine: High blood sugar controlled without the use of insulin (NIDDM) Rheumatologic: No reported rheumatological signs and symptoms such as fatigue, joint pain, tenderness, swelling, redness, heat, stiffness, decreased range of motion, with or without associated rash Musculoskeletal: Negative for myasthenia gravis, muscular dystrophy, multiple sclerosis or malignant hyperthermia Work History: Disabled  Allergies  James [name] No Known Allergies.  Laboratory Chemistry Profile   Renal Lab Results  Component Value Date   BUN 8 10/30/2020   CREATININE 0.78 10/30/2020   GFRNONAA >  60 10/30/2020     Electrolytes Lab Results  Component Value Date   NA 140 10/30/2020   K 3.4 (L) 10/30/2020   CL 99 10/30/2020   CALCIUM 8.7 (L) 10/30/2020     Hepatic Lab Results  Component Value Date   AST 10 (L) 10/30/2020   ALT 6 10/30/2020   ALBUMIN 3.4 (L) 10/30/2020   ALKPHOS 89 10/30/2020     ID No results found for: LYMEIGGIGMAB, HIV, [place], STAPHAUREUS, MRSAPCR, HCVAB, PREGTESTUR, RMSFIGG, QFVRPH1IGG, QFVRPH2IGG, LYMEIGGIGMAB   Bone No results found for: VD25OH, CH885OY7XAJ, OI7867EH2, CN4709GG8, 25OHVITD1, 25OHVITD2, 25OHVITD3, TESTOFREE, TESTOSTERONE   Endocrine Lab Results  Component Value Date   GLUCOSE 110 (H) 10/30/2020   HGBA1C 5.7 (A) 12/09/2020     Neuropathy Lab Results  Component Value Date   HGBA1C 5.7 (A) 12/09/2020     CNS No results found for: COLORCSF, APPEARCSF, RBCCOUNTCSF, WBCCSF, POLYSCSF, LYMPHSCSF, EOSCSF, PROTEINCSF, GLUCCSF, JCVIRUS, CSFOLI, IGGCSF, LABACHR, ACETBL, LABACHR, ACETBL   Inflammation (CRP: Acute   ESR: Chronic) No results found for: CRP, ESRSEDRATE, LATICACIDVEN   Rheumatology No results found for: RF, ANA, LABURIC, URICUR, LYMEIGGIGMAB, LYMEABIGMQN, HLAB27   Coagulation Lab  Results  Component Value Date   PLT 453 (H) 10/30/2020     Cardiovascular Lab Results  Component Value Date   HGB 14.4 10/30/2020   HCT 43.4 10/30/2020     Screening No results found for: SARSCOV2NAA, COVIDSOURCE, STAPHAUREUS, MRSAPCR, HCVAB, HIV, PREGTESTUR   Cancer No results found for: CEA, CA125, LABCA2   Allergens No results found for: ALMOND, AP[place], ASPARAGUS, AVOCADO, BANANA, BARLEY, BASIL, B[place]LE[place], GREENBEAN, LIMABEAN, WHITEBEAN, BEEFIGE, REDBEET, BLUEBERRY, BROCCOLI, CABBAGE, MELON, CARROT, CASEIN, CASHEWNUT, CAULIFLOWER, CELERY     Note: Lab results reviewed.  [place]  Drug: James [name]  has no history on file for drug use. Alcohol:  has no history on file for alcohol use. Tobacco:  reports that he has been smoking cigarettes. He has been smoking an average of .25 packs per day. He has never used smokeless tobacco. Medical:  has a past medical history of Arthritis, Diabetes mellitus without complication ([place]), Hyperlipidemia, and Hypertension. Family: family history includes Arthritis in his father and mother; Heart disease in his mother.  No past surgical history on file. Active Ambulatory Problems    Diagnosis Date Noted   Chronic pain syndrome 11/09/2020   DDD (degenerative disc disease), lumbar 11/09/2020   Primary osteoarthritis involving multiple joints 11/09/2020   Diabetes mellitus ([place]) 11/09/2020   Weakness of lower extremities (Bilateral) 01/15/2021   Morbid obesity with BMI of 50.0-59.9, adult ([place]) 01/15/2021   Recurrent falls 01/15/2021   Pharmacologic therapy 02/15/2021   Disorder of skeletal system 02/15/2021   Problems influencing health status 02/15/2021   Chronic use of opiate for therapeutic purpose 02/15/2021   Type 2 diabetes mellitus with morbid obesity ([place]) 02/15/2021   Chronic lower extremity pain (1ry area of Pain) (Bilateral) 02/15/2021   Chronic low back pain (2ry area of Pain) (Bilateral) w/ sciatica (Bilateral) 02/15/2021   Chronic  shoulder pain (3ry area of Pain) (Left) 02/15/2021   Chronic hip pain (Bilateral) 02/15/2021   Chronic knee pain (Bilateral) 02/15/2021   Resolved Ambulatory Problems    Diagnosis Date Noted   No Resolved Ambulatory Problems   Past Medical History:  Diagnosis Date   Arthritis    Diabetes mellitus without complication ([place])    Hyperlipidemia    Hypertension    Constitutional Exam  General appearance: Well nourished, well developed, and well hydrated.  In no apparent acute distress Vitals:   02/15/21 0901  BP: 135/84  Pulse: 99  Temp: (!) 97.2 F (36.2 C)  SpO2: 96%  Weight: (!) 375 lb 12.8 oz (170.5 kg)  Height: 6' (1.829 m)   BMI Assessment: Estimated body mass index is 50.97 kg/m as calculated from the following:   Height as of this encounter: 6' (1.829 m).   Weight as of this encounter: 375 lb 12.8 oz (170.5 kg).  BMI interpretation table: BMI level Category Range association with higher incidence of chronic pain  <18 kg/m2 Underweight   18.5-24.9 kg/m2 Ideal body weight   25-29.9 kg/m2 Overweight Increased incidence by 20%  30-34.9 kg/m2 Obese (Class I) Increased incidence by 68%  35-39.9 kg/m2 Severe obesity (Class II) Increased incidence by 136%  >40 kg/m2 Extreme obesity (Class III) Increased incidence by 254%   Patient's current BMI Ideal Body weight  Body mass index is 50.97 kg/m. Ideal body weight: 77.6 kg (171 lb 1.2 oz) Adjusted ideal body weight: 114.7 kg (252 lb 15.5 oz)   BMI Readings from Last 4 Encounters:  02/15/21 50.97 kg/m  01/15/21 50.59 kg/m  01/06/21 50.99 kg/m  12/09/20 50.99 kg/m   Wt Readings from Last 4 Encounters:  02/15/21 (!) 375 lb 12.8 oz (170.5 kg)  01/15/21 (!) 373 lb (169.2 kg)  01/06/21 (!) 376 lb (170.6 kg)  12/09/20 (!) 376 lb (170.6 kg)    Psych/Mental status: Alert, oriented x 3 (person, place, & time)       Eyes: PERLA Respiratory: No evidence of acute respiratory distress  Assessment  Primary Diagnosis &  Pertinent Problem List: The primary encounter diagnosis was Chronic pain syndrome. Diagnoses of Chronic lower extremity pain (1ry area of Pain) (Bilateral), Weakness of lower extremities (Bilateral), Chronic hip pain (Bilateral), Chronic knee pain (Bilateral), Chronic low back pain (2ry area of Pain) (Bilateral) w/ sciatica (Bilateral), Chronic shoulder pain (3ry area of Pain) (Left), Primary osteoarthritis involving multiple joints, Pharmacologic therapy, Disorder of skeletal system, Problems influencing health status, Chronic use of opiate for therapeutic purpose, Morbid obesity with BMI of 50.0-59.9, adult ([place]), and Type 2 diabetes mellitus with morbid obesity ([place]) were also pertinent to this visit.  Visit Diagnosis (New problems to examiner): 1. Chronic pain syndrome   2. Chronic lower extremity pain (1ry area of Pain) (Bilateral)   3. Weakness of lower extremities (Bilateral)   4. Chronic hip pain (Bilateral)   5. Chronic knee pain (Bilateral)   6. Chronic low back pain (2ry area of Pain) (Bilateral) w/ sciatica (Bilateral)   7. Chronic shoulder pain (3ry area of Pain) (Left)   8. Primary osteoarthritis involving multiple joints   9. Pharmacologic therapy   10. Disorder of skeletal system   11. Problems influencing health status   12. Chronic use of opiate for therapeutic purpose   13. Morbid obesity with BMI of 50.0-59.9, adult ([place])   14. Type 2 diabetes mellitus with morbid obesity ([place])    Plan of Care (Initial workup plan)  Note: James [name] was reminded that as per protocol, today's visit has been an evaluation only. We have not taken over the patient's controlled substance management.  Problem-specific plan: No problem-specific Assessment & Plan notes found for this encounter.  Lab Orders         Compliance Drug Analysis, Ur         Comp. Metabolic Panel (12)         Magnesium  Vitamin B12         Sedimentation rate         25-Hydroxy vitamin D Lcms D2+D3          C-reactive protein     Imaging Orders         DG HIP UNILAT W OR W/O PELVIS 2-3 VIEWS RIGHT         DG HIP UNILAT W OR W/O PELVIS 2-3 VIEWS LEFT         DG Knee Complete 4 Views Right         DG Knee Complete 4 Views Left     Referral Orders         Amb Ref to Medical Weight Management         Amb Referral to Bariatric Surgery         Amb Ref to Medical Weight Management     Procedure Orders    No procedure(s) ordered today   Pharmacotherapy (current): Medications ordered:  No orders of the defined types were placed in this encounter.  Medications administered during this visit: James [name] had no medications administered during this visit.   Pharmacological management options:  Opioid Analgesics: The patient was informed that there is no guarantee that he would be a candidate for opioid analgesics. The decision will be made following CDC guidelines. This decision will be based on the results of diagnostic studies, as well as James [name] risk profile.   Membrane stabilizer: To be determined at a later time  Muscle relaxant: To be determined at a later time  NSAID: To be determined at a later time  Other analgesic(s): To be determined at a later time   Interventional management options: James [name] was informed that there is no guarantee that he would be a candidate for interventional therapies. The decision will be based on the results of diagnostic studies, as well as James [name] risk profile.  Procedure(s) under consideration:  Pending results of ordered studies      Interventional Therapies  Risk   Complexity Considerations:   Estimated body mass index is 50.97 kg/m as calculated from the following:   Height as of this encounter: 6' (1.829 m).   Weight as of this encounter: 375 lb 12.8 oz (170.5 kg). WNL   Planned   Pending:   Pending further evaluation   Under consideration:   Diagnostic bilateral lower extremity nerve conduction  testing. Lumbar spine MRI. X-rays of the lumbar spine on flexion and extension. X-rays of both hip joints. X-rays of both knees. X-rays of the left shoulder. Referral to weight management/bariatric surgery. Will eventually need referral to orthopedic surgery.   Completed:   None at this time   Therapeutic   Palliative (PRN) options:   None established    Provider-requested follow-up: Return for (76mn), Eval-day (M,W), (F2F), 2nd Visit, for review of ordered tests.  Future Appointments  Date Time Provider DRea 02/19/2021  9:20 AM [facility]lin [facility]er DO [facility]  04/12/2021 10:45 AM MGardiner Barefoot DPM [facility]    Note by: FGaspar Cola MD Date: 02/15/2021; Time: 10:36 AM

## 2021-02-15 ENCOUNTER — Encounter: Payer: Self-pay | Admitting: Pain Medicine

## 2021-02-15 ENCOUNTER — Other Ambulatory Visit: Payer: Self-pay

## 2021-02-15 ENCOUNTER — Ambulatory Visit: Payer: Medicare HMO | Attending: Pain Medicine | Admitting: Pain Medicine

## 2021-02-15 VITALS — BP 135/84 | HR 99 | Temp 97.2°F | Ht 72.0 in | Wt 375.8 lb

## 2021-02-15 DIAGNOSIS — E1169 Type 2 diabetes mellitus with other specified complication: Secondary | ICD-10-CM | POA: Insufficient documentation

## 2021-02-15 DIAGNOSIS — M5442 Lumbago with sciatica, left side: Secondary | ICD-10-CM | POA: Diagnosis not present

## 2021-02-15 DIAGNOSIS — M25512 Pain in left shoulder: Secondary | ICD-10-CM | POA: Diagnosis not present

## 2021-02-15 DIAGNOSIS — M25562 Pain in left knee: Secondary | ICD-10-CM | POA: Diagnosis not present

## 2021-02-15 DIAGNOSIS — Z6841 Body Mass Index (BMI) 40.0 and over, adult: Secondary | ICD-10-CM

## 2021-02-15 DIAGNOSIS — Z789 Other specified health status: Secondary | ICD-10-CM | POA: Insufficient documentation

## 2021-02-15 DIAGNOSIS — R29898 Other symptoms and signs involving the musculoskeletal system: Secondary | ICD-10-CM | POA: Insufficient documentation

## 2021-02-15 DIAGNOSIS — M25551 Pain in right hip: Secondary | ICD-10-CM | POA: Insufficient documentation

## 2021-02-15 DIAGNOSIS — Z79891 Long term (current) use of opiate analgesic: Secondary | ICD-10-CM | POA: Diagnosis not present

## 2021-02-15 DIAGNOSIS — M25561 Pain in right knee: Secondary | ICD-10-CM | POA: Diagnosis not present

## 2021-02-15 DIAGNOSIS — M5441 Lumbago with sciatica, right side: Secondary | ICD-10-CM

## 2021-02-15 DIAGNOSIS — M159 Polyosteoarthritis, unspecified: Secondary | ICD-10-CM | POA: Diagnosis not present

## 2021-02-15 DIAGNOSIS — M79604 Pain in right leg: Secondary | ICD-10-CM | POA: Insufficient documentation

## 2021-02-15 DIAGNOSIS — G894 Chronic pain syndrome: Secondary | ICD-10-CM

## 2021-02-15 DIAGNOSIS — M79605 Pain in left leg: Secondary | ICD-10-CM | POA: Insufficient documentation

## 2021-02-15 DIAGNOSIS — G8929 Other chronic pain: Secondary | ICD-10-CM | POA: Insufficient documentation

## 2021-02-15 DIAGNOSIS — Z79899 Other long term (current) drug therapy: Secondary | ICD-10-CM | POA: Insufficient documentation

## 2021-02-15 DIAGNOSIS — M25552 Pain in left hip: Secondary | ICD-10-CM

## 2021-02-15 DIAGNOSIS — M899 Disorder of bone, unspecified: Secondary | ICD-10-CM | POA: Insufficient documentation

## 2021-02-15 NOTE — Progress Notes (Signed)
Nursing Pain Medication Assessment:  Safety precautions to be maintained throughout the outpatient stay will include: orient to surroundings, keep bed in low position, maintain call bell within reach at all times, provide assistance with transfer out of bed and ambulation.  Medication Inspection Compliance: Pill count conducted under aseptic conditions, in front of the patient. Neither the pills nor the bottle was removed from the patient's sight at any time. Once count was completed pills were immediately returned to the patient in their original bottle.  Medication: Oxycodone/APAP Pill/Patch Count:  42 of 60 pills remain Pill/Patch Appearance: Markings consistent with prescribed medication Bottle Appearance: Standard pharmacy container. Clearly labeled. Filled Date: 01 / 18 / 2023 Last Medication intake:  Today

## 2021-02-19 ENCOUNTER — Encounter: Payer: Self-pay | Admitting: Family Medicine

## 2021-02-19 ENCOUNTER — Other Ambulatory Visit: Payer: Self-pay

## 2021-02-19 ENCOUNTER — Ambulatory Visit (INDEPENDENT_AMBULATORY_CARE_PROVIDER_SITE_OTHER): Payer: Medicare Other | Admitting: Family Medicine

## 2021-02-19 VITALS — BP 145/88 | HR 83 | Ht 72.0 in | Wt 384.5 lb

## 2021-02-19 DIAGNOSIS — M5136 Other intervertebral disc degeneration, lumbar region: Secondary | ICD-10-CM | POA: Diagnosis not present

## 2021-02-19 DIAGNOSIS — M79675 Pain in left toe(s): Secondary | ICD-10-CM

## 2021-02-19 DIAGNOSIS — Z720 Tobacco use: Secondary | ICD-10-CM

## 2021-02-19 DIAGNOSIS — M7989 Other specified soft tissue disorders: Secondary | ICD-10-CM

## 2021-02-19 DIAGNOSIS — G894 Chronic pain syndrome: Secondary | ICD-10-CM | POA: Diagnosis not present

## 2021-02-19 DIAGNOSIS — M25551 Pain in right hip: Secondary | ICD-10-CM

## 2021-02-19 DIAGNOSIS — G8929 Other chronic pain: Secondary | ICD-10-CM | POA: Diagnosis not present

## 2021-02-19 DIAGNOSIS — M159 Polyosteoarthritis, unspecified: Secondary | ICD-10-CM | POA: Diagnosis not present

## 2021-02-19 LAB — URIC ACID: Uric Acid, Serum: 6.3 mg/dL (ref 4.0–8.0)

## 2021-02-19 MED ORDER — VARENICLINE TARTRATE 1 MG PO TABS: 1.0000 mg | ORAL_TABLET | Freq: Two times a day (BID) | ORAL | 1 refills | Status: DC

## 2021-02-19 MED ORDER — OXYCODONE-ACETAMINOPHEN 10-325 MG PO TABS: 1.0000 | ORAL_TABLET | Freq: Three times a day (TID) | ORAL | 0 refills | Status: DC | PRN

## 2021-02-19 NOTE — Patient Instructions (Addendum)
Thank you for coming to the office today.  DUE for FASTING BLOOD WORK (no food or drink after midnight before the lab appointment, only water or coffee without cream/sugar on the morning of)  SCHEDULE "Lab Only" visit in the morning at the clinic for lab draw in 4 MONTHS   - Make sure Lab Only appointment is at about 1 week before your next appointment, so that results will be available  For Lab Results, once available within 2-3 days of blood draw, you can can log in to MyChart online to view your results and a brief explanation. Also, we can discuss results at next follow-up visit.   Blood test today for GOUT Uric Acid will route to your foot doctor once resulted.  Refill Pain Pill for now until return to Pain Management.  Varenicline - Chantix Dosing: Duration Dosage  Initial 3 days  0.5mg  daily  Days 4-7 0.5mg  twice daily  Day 8 through the end of therapy  1mg  twice daily   Prescribing Instructions: Use of Chantix - Starting Month Pak and Chantix - Continuing Month Pak provide easy to use blister packaging.  Black Box Warning: Mood Change -Serious neuropsychiatric events have been reported. Stop Drug and contact health care provider if patient agitation, hostility, depressed mood or changes in thinking or behavior that are not typical for the patient are observed, or if the patient develops suicidal ideation / behavior. Document potential mood change.  Patients can continue to smoke while initiating varenicline. A target quit date should be set on calendar approximately Day 8 to max Day 30 (of the treatment) - PREFERRED QUIT DATE - 1 to 2 weeks from start of medicine. Advise to take this medication WITH food.  Minimizing the nausea (typically mild and transient).  If nausea occurs, consider dose reduction or temporary cessation of treatment.   The treatment should be continued for 12 weeks.  An additional 12 weeks of therapy can be used at the prescribers discretion.    Chantix can change the way people react to alcohol (decreased tolerance, increased drunkenness, unusual or aggressive behavior, memory loss) Seizures occurred in patients that had no history of seizures and in patients that had a seizure disorder that had been well controlled  Discuss Cardiovascular Safety   Please schedule a Follow-up Appointment to: Return in about 4 months (around 06/19/2021) for 4 month Annual Physical AM apt fasting lab AFTER.  If you have any other questions or concerns, please feel free to call the office or send a message through [place]. You may also schedule an earlier appointment if necessary.  Additionally, you may be receiving a survey about your experience at our office within a few days to 1 week by e-mail or mail. We value your feedback.  Nobie Putnam, DO [place]

## 2021-02-19 NOTE — Progress Notes (Signed)
Subjective:    Patient ID: James [name], male    DOB: [birth year], 61 y.o.   MRN: [id number]  James [name] is a 61 y.o. male presenting on 02/19/2021 for Pain   HPI  Left Foot Pain and Swelling Possible Gout Has seen Podiatry and they requested Uric Acid lab, last seen 12/2020 Episodic flares of swelling and pain on top of foot Interested in Uric acid  Chronic Pain See prior note for background medical history on this topic Followed by Dr Dossie Arbour at [facility] Establishing care and will follow up with testing imaging and procedural and other interventional management He will soon be due for refill oxycodone until can return to Pain Management   Depression screen [facility] 2/9 02/15/2021 01/15/2021  Decreased Interest 0 0  Down, Depressed, Hopeless 0 0  PHQ - 2 Score 0 0  Altered sleeping - 2  Tired, decreased energy - 2  Change in appetite - 2  Feeling bad or failure about yourself  - 0  Trouble concentrating - 0  Moving slowly or fidgety/restless - 0  Suicidal thoughts - 0  PHQ-9 Score - 6  Difficult doing work/chores - Not difficult at all    Social History   Tobacco Use   Smoking status: Some Days    Packs/day: 0.25    Types: Cigarettes   Smokeless tobacco: Never    Review of Systems Per HPI unless specifically indicated above     Objective:    BP (!) 145/88 (BP Location: Left Arm, Cuff Size: Large)    Pulse 83    Ht 6' (1.829 m)    Wt (!) 384 lb 8 oz (174.4 kg)    SpO2 96%    BMI 52.15 kg/m   Wt Readings from Last 3 Encounters:  02/19/21 (!) 384 lb 8 oz (174.4 kg)  02/15/21 (!) 375 lb 12.8 oz (170.5 kg)  01/15/21 (!) 373 lb (169.2 kg)    Physical Exam Vitals and nursing note reviewed.  Constitutional:      General: He is not in acute distress.    Appearance: Normal appearance. He is well-developed. He is obese. He is not diaphoretic.     Comments: Well-appearing, comfortable, cooperative  HENT:     Head: Normocephalic and atraumatic.   Eyes:     General:        Right eye: No discharge.        Left eye: No discharge.     Conjunctiva/sclera: Conjunctivae normal.  Cardiovascular:     Rate and Rhythm: Normal rate.  Pulmonary:     Effort: Pulmonary effort is normal.  Skin:    General: Skin is warm and dry.     Findings: No erythema or rash.  Neurological:     Mental Status: He is alert and oriented to person, place, and time.  Psychiatric:        Mood and Affect: Mood normal.        Behavior: Behavior normal.        Thought Content: Thought content normal.     Comments: Well groomed, good eye contact, normal speech and thoughts     Results for orders placed or performed in visit on 02/15/21  Comp. Metabolic Panel (12)  Result Value Ref Range   Glucose 95 70 - 99 mg/dL   BUN 8 6 - 24 mg/dL   Creatinine, Ser 0.70 (L) 0.76 - 1.27 mg/dL   eGFR 107 >59 mL/min/1.73   BUN/Creatinine Ratio 11 9 -  20   Sodium 137 134 - 144 mmol/L   Potassium 4.7 3.5 - 5.2 mmol/L   Chloride 97 96 - 106 mmol/L   Calcium 9.9 8.7 - 10.2 mg/dL   Total Protein 7.0 6.0 - 8.5 g/dL   Albumin 4.1 3.8 - 4.9 g/dL   Globulin, Total 2.9 1.5 - 4.5 g/dL   Albumin/Globulin Ratio 1.4 1.2 - 2.2   Bilirubin Total 0.4 0.0 - 1.2 mg/dL   Alkaline Phosphatase 116 44 - 121 IU/L   AST 6 0 - 40 IU/L  Magnesium  Result Value Ref Range   Magnesium 1.8 1.6 - 2.3 mg/dL  Vitamin B12  Result Value Ref Range   Vitamin B-12 217 (L) 232 - 1,245 pg/mL  Sedimentation rate  Result Value Ref Range   Sed Rate 39 (H) 0 - 30 mm/hr  25-Hydroxy vitamin D Lcms D2+D3  Result Value Ref Range   25-Hydroxy, Vitamin D WILL FOLLOW    25-Hydroxy, Vitamin D-2 WILL FOLLOW    25-Hydroxy, Vitamin D-3 WILL FOLLOW   C-reactive protein  Result Value Ref Range   CRP 29 (H) 0 - 10 mg/L      Assessment & Plan:   Problem List Items Addressed This Visit     Primary osteoarthritis involving multiple joints (Chronic)   Relevant Medications   oxyCODONE-acetaminophen (PERCOCET)  10-325 MG tablet   DDD (degenerative disc disease), lumbar (Chronic)   Relevant Medications   oxyCODONE-acetaminophen (PERCOCET) 10-325 MG tablet   Chronic pain syndrome (Chronic)   Relevant Medications   oxyCODONE-acetaminophen (PERCOCET) 10-325 MG tablet   Other Visit Diagnoses     Pain and swelling of toe of left foot    -  Primary   Relevant Orders   Uric acid   Tobacco abuse       Relevant Medications   varenicline (CHANTIX CONTINUING MONTH PAK) 1 MG tablet   Chronic right hip pain       Relevant Medications   oxyCODONE-acetaminophen (PERCOCET) 10-325 MG tablet       Check uric acid today for possible recent gout flare Will route result to Lanae Crumbly DPM [facility] as well.  Chronic Pain Syndrome OA/DJD Lumbar DDD Hip Shoulder Agree to continue management of his opiate pain medication oxycodone 10/325 BID PRN #90 rx sent today for 30 days and can refill bridge until he transfers care to pain management.  Meds ordered this encounter  Medications   varenicline (CHANTIX CONTINUING MONTH PAK) 1 MG tablet    Sig: Take 1 tablet (1 mg total) by mouth 2 (two) times daily.    Dispense:  60 tablet    Refill:  1   oxyCODONE-acetaminophen (PERCOCET) 10-325 MG tablet    Sig: Take 1 tablet by mouth 3 (three) times daily as needed for pain.    Dispense:  90 tablet    Refill:  0      Follow up plan: Return in about 4 months (around 06/19/2021) for 4 month Annual Physical AM apt fasting lab AFTER.  Nobie Putnam, [facility] 02/19/2021, 9:30 AM

## 2021-02-21 LAB — COMP. METABOLIC PANEL (12)
AST: 6 IU/L (ref 0–40)
Albumin/Globulin Ratio: 1.4 (ref 1.2–2.2)
Albumin: 4.1 g/dL (ref 3.8–4.9)
Alkaline Phosphatase: 116 IU/L (ref 44–121)
BUN/Creatinine Ratio: 11 (ref 9–20)
BUN: 8 mg/dL (ref 6–24)
Bilirubin Total: 0.4 mg/dL (ref 0.0–1.2)
Calcium: 9.9 mg/dL (ref 8.7–10.2)
Chloride: 97 mmol/L (ref 96–106)
Creatinine, Ser: 0.7 mg/dL — ABNORMAL LOW (ref 0.76–1.27)
Globulin, Total: 2.9 g/dL (ref 1.5–4.5)
Glucose: 95 mg/dL (ref 70–99)
Potassium: 4.7 mmol/L (ref 3.5–5.2)
Sodium: 137 mmol/L (ref 134–144)
Total Protein: 7 g/dL (ref 6.0–8.5)
eGFR: 107 mL/min/{1.73_m2} (ref >59)

## 2021-02-21 LAB — MAGNESIUM: Magnesium: 1.8 mg/dL (ref 1.6–2.3)

## 2021-02-21 LAB — C-REACTIVE PROTEIN: CRP: 29 mg/L — ABNORMAL HIGH (ref 0–10)

## 2021-02-21 LAB — 25-HYDROXY VITAMIN D LCMS D2+D3
25-Hydroxy, Vitamin D-2: <1 ng/mL
25-Hydroxy, Vitamin D-3: 12 ng/mL
25-Hydroxy, Vitamin D: 13 ng/mL — ABNORMAL LOW

## 2021-02-21 LAB — VITAMIN B12: Vitamin B-12: 217 pg/mL — ABNORMAL LOW (ref 232–1245)

## 2021-02-21 LAB — SEDIMENTATION RATE: Sed Rate: 39 mm/hr — ABNORMAL HIGH (ref 0–30)

## 2021-02-22 LAB — COMPLIANCE DRUG ANALYSIS, UR

## 2021-02-23 ENCOUNTER — Ambulatory Visit
Admission: RE | Admit: 2021-02-23 | Discharge: 2021-02-23 | Disposition: A | Discharge status: Home / Self Care | Payer: Medicare Other | Attending: Pain Medicine | Admitting: Pain Medicine

## 2021-02-23 ENCOUNTER — Ambulatory Visit
Admission: RE | Admit: 2021-02-23 | Discharge: 2021-02-23 | Disposition: A | Discharge status: Home / Self Care | Payer: Medicare Other | Source: Ambulatory Visit | Attending: Pain Medicine | Admitting: Pain Medicine

## 2021-02-23 DIAGNOSIS — M25551 Pain in right hip: Secondary | ICD-10-CM | POA: Diagnosis not present

## 2021-02-23 DIAGNOSIS — M25552 Pain in left hip: Secondary | ICD-10-CM | POA: Insufficient documentation

## 2021-02-23 DIAGNOSIS — M1611 Unilateral primary osteoarthritis, right hip: Secondary | ICD-10-CM | POA: Diagnosis not present

## 2021-02-23 DIAGNOSIS — M79604 Pain in right leg: Secondary | ICD-10-CM | POA: Insufficient documentation

## 2021-02-23 DIAGNOSIS — M25561 Pain in right knee: Secondary | ICD-10-CM

## 2021-02-23 DIAGNOSIS — M79605 Pain in left leg: Secondary | ICD-10-CM

## 2021-02-23 DIAGNOSIS — M25562 Pain in left knee: Secondary | ICD-10-CM | POA: Insufficient documentation

## 2021-02-23 DIAGNOSIS — M1712 Unilateral primary osteoarthritis, left knee: Secondary | ICD-10-CM | POA: Diagnosis not present

## 2021-02-23 DIAGNOSIS — M159 Polyosteoarthritis, unspecified: Secondary | ICD-10-CM

## 2021-02-23 DIAGNOSIS — G8929 Other chronic pain: Secondary | ICD-10-CM | POA: Diagnosis not present

## 2021-02-23 DIAGNOSIS — M1612 Unilateral primary osteoarthritis, left hip: Secondary | ICD-10-CM | POA: Diagnosis not present

## 2021-02-23 IMAGING — CR DG HIP (WITH OR WITHOUT PELVIS) 2-3V*R*
1 series · 3 of 3 positions shown · non-contrast
Comparison: None.

CLINICAL DATA: Chronic right hip pain

EXAM:
DG HIP (WITH OR WITHOUT PELVIS) 2-3V RIGHT

[Series 1: dg hip unilat w or w/o pelvis 2-3 views  · non-contrast · 0.14mm/px · 3 of 3 slices shown]
[im 1/3]
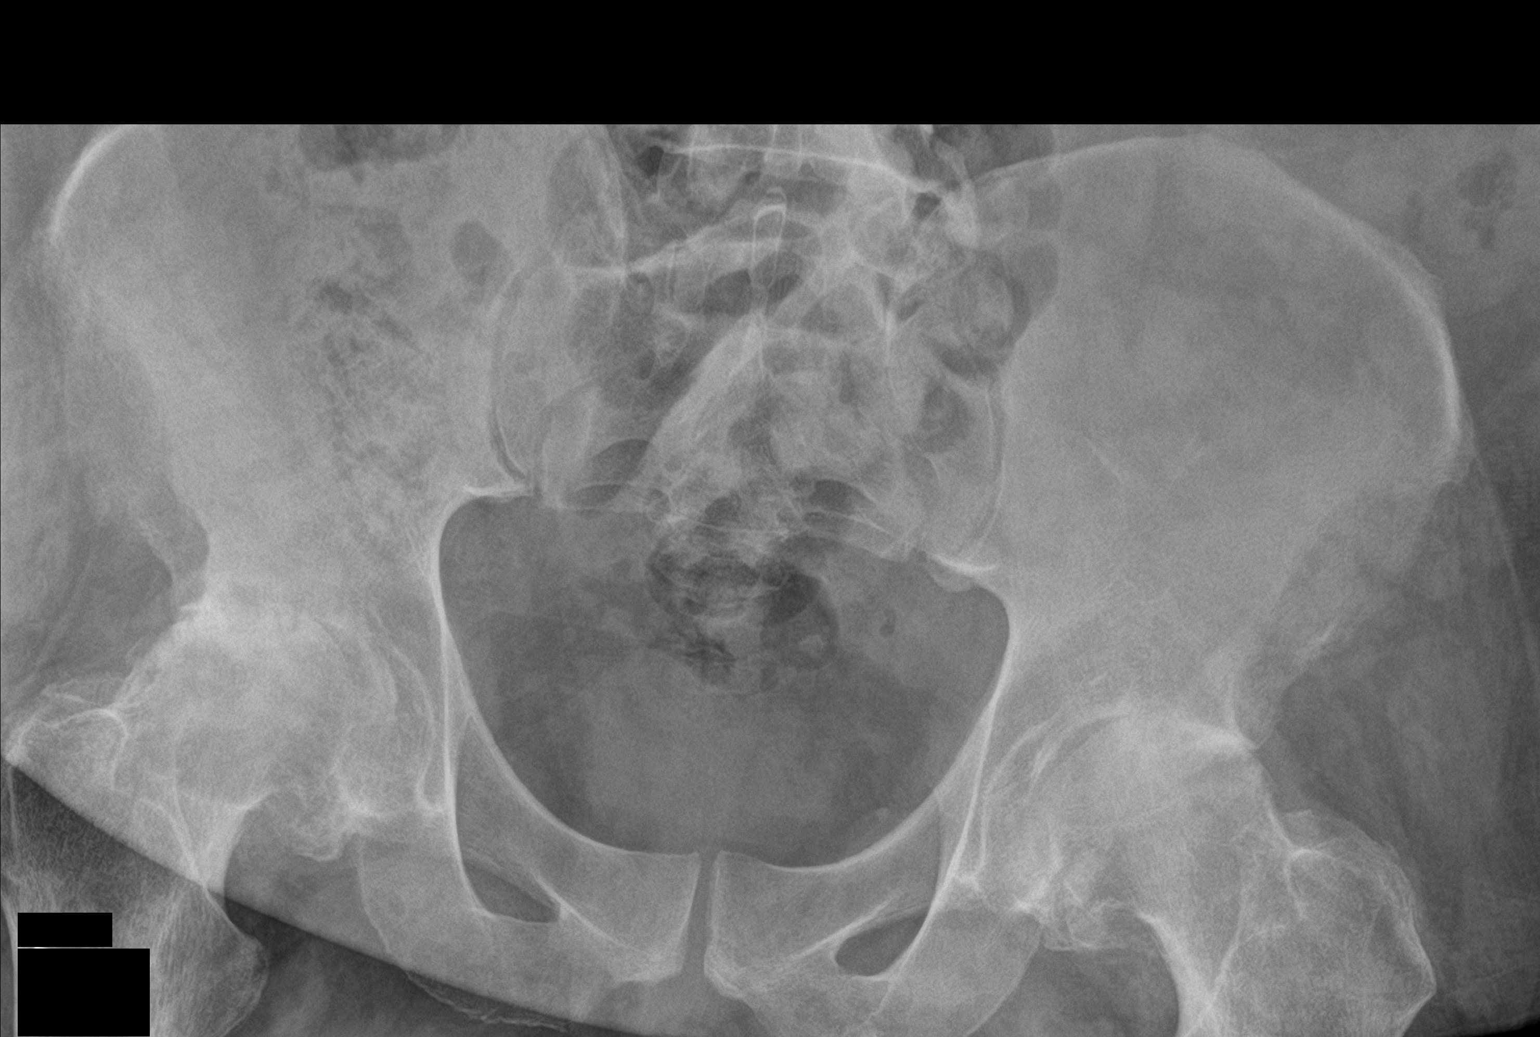
[im 2/3]
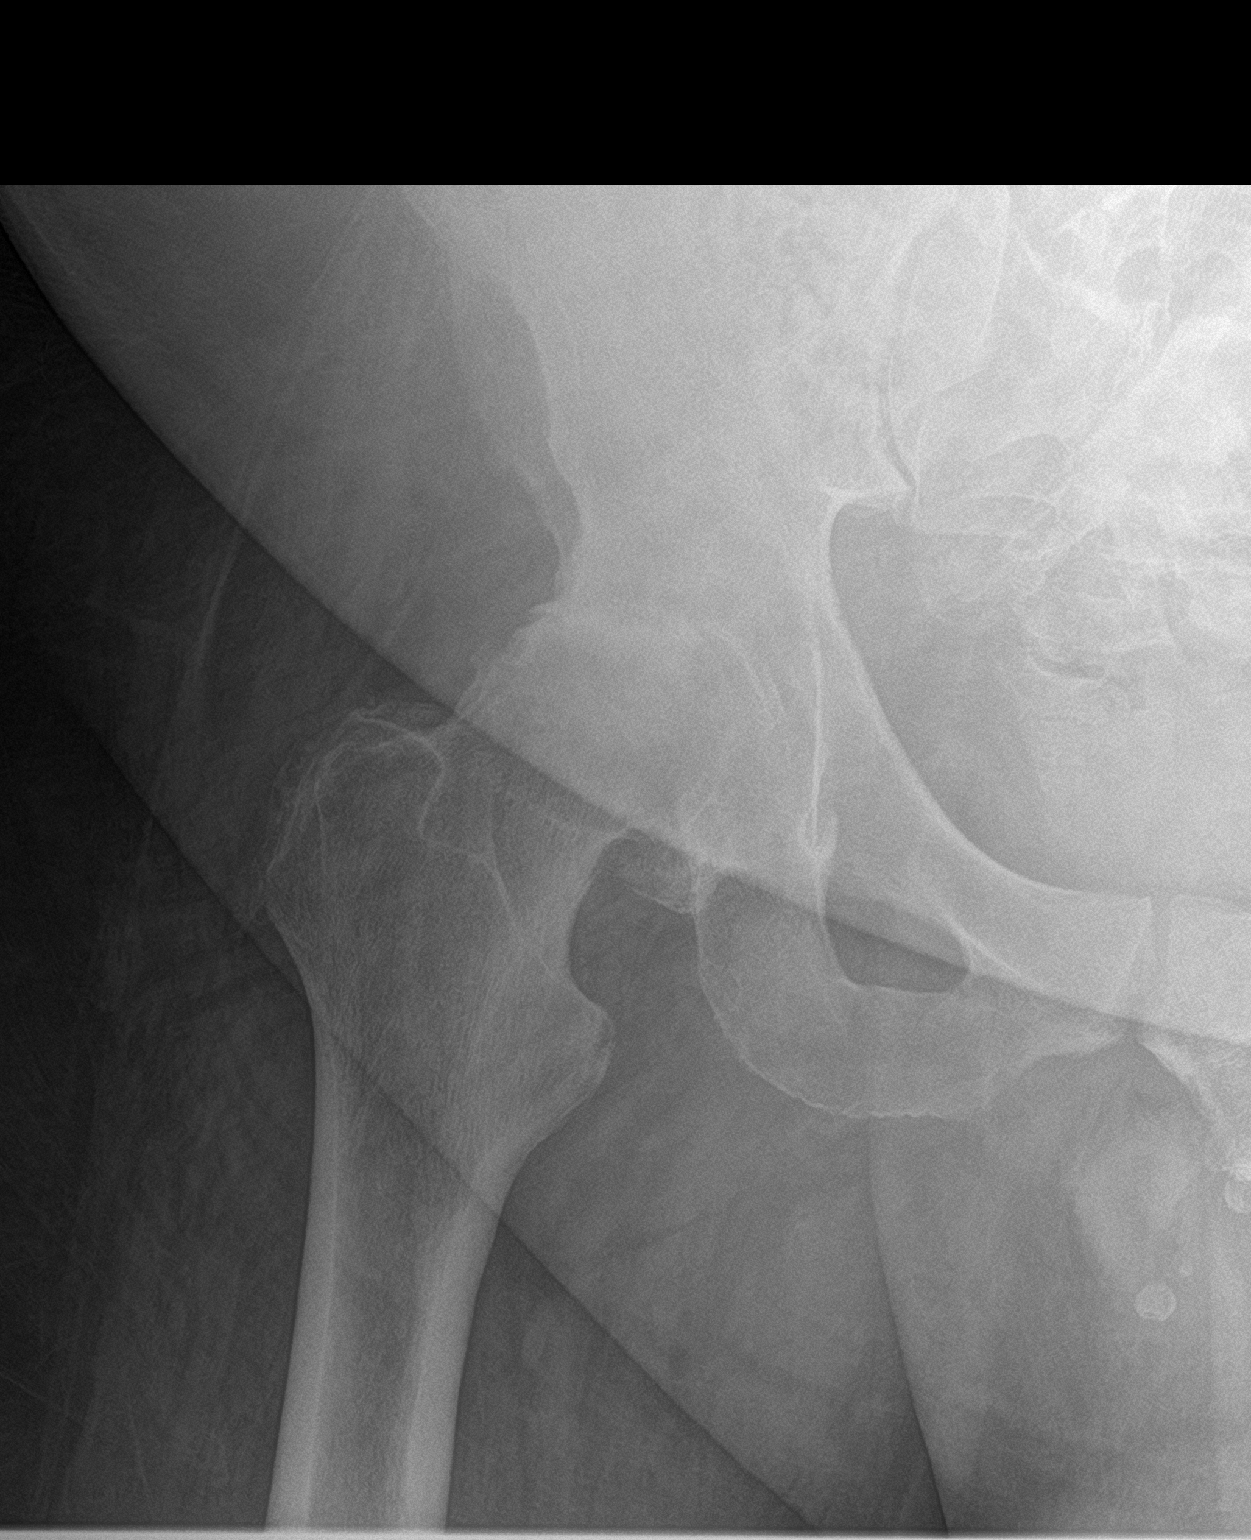
[im 3/3]
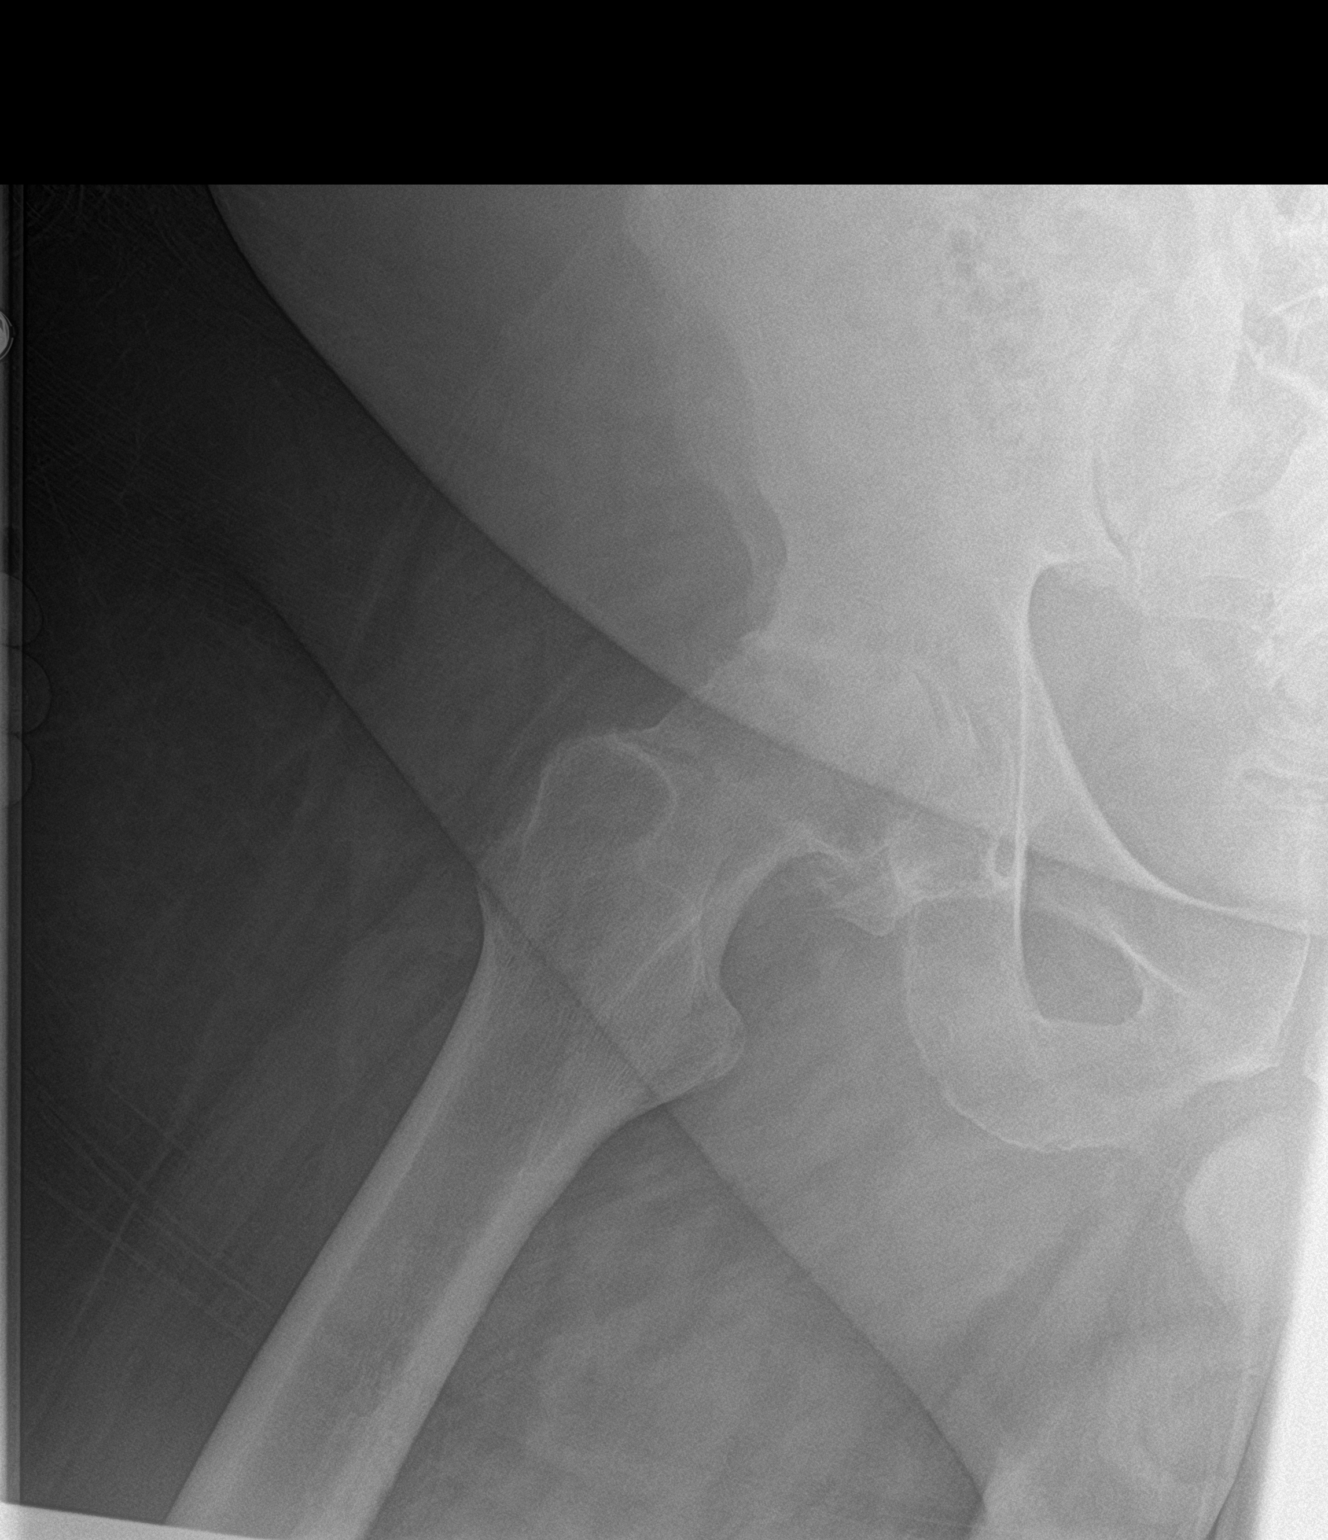

[3 of 3 positions shown; findings below may reference images not displayed]

FINDINGS: There is severe bilateral degenerative hip arthritis with near
complete loss of the joint space and subchondral cyst formation,
right greater than left. No acute fracture or dislocation.
Sacroiliac joint spaces are preserved. Soft tissues are
unremarkable.
IMPRESSION: Severe bilateral degenerative hip arthritis, right greater than
left.

## 2021-02-23 IMAGING — CR DG KNEE COMPLETE 4+V*L*
1 series · 4 of 4 positions shown · non-contrast
Comparison: None.

CLINICAL DATA: Left knee pain/arthralgia

EXAM:
LEFT KNEE - COMPLETE 4+ VIEW

[Series 1: dg knee complete 4 views left · 0.14mm/px · 4 of 4 slices shown]
[im 1/4]
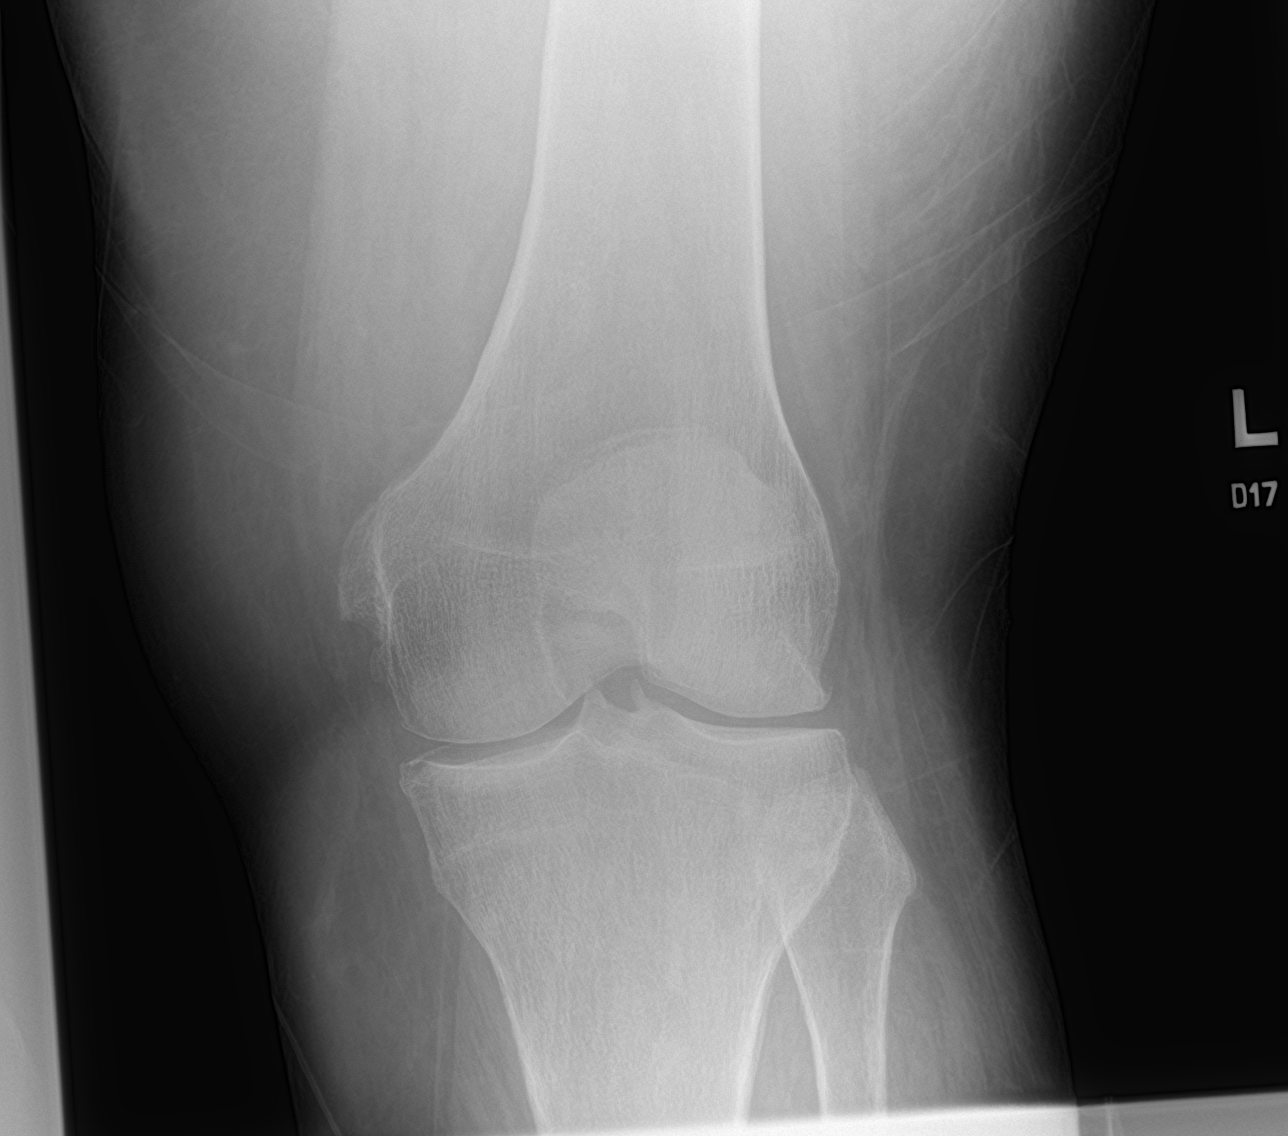
[im 2/4]
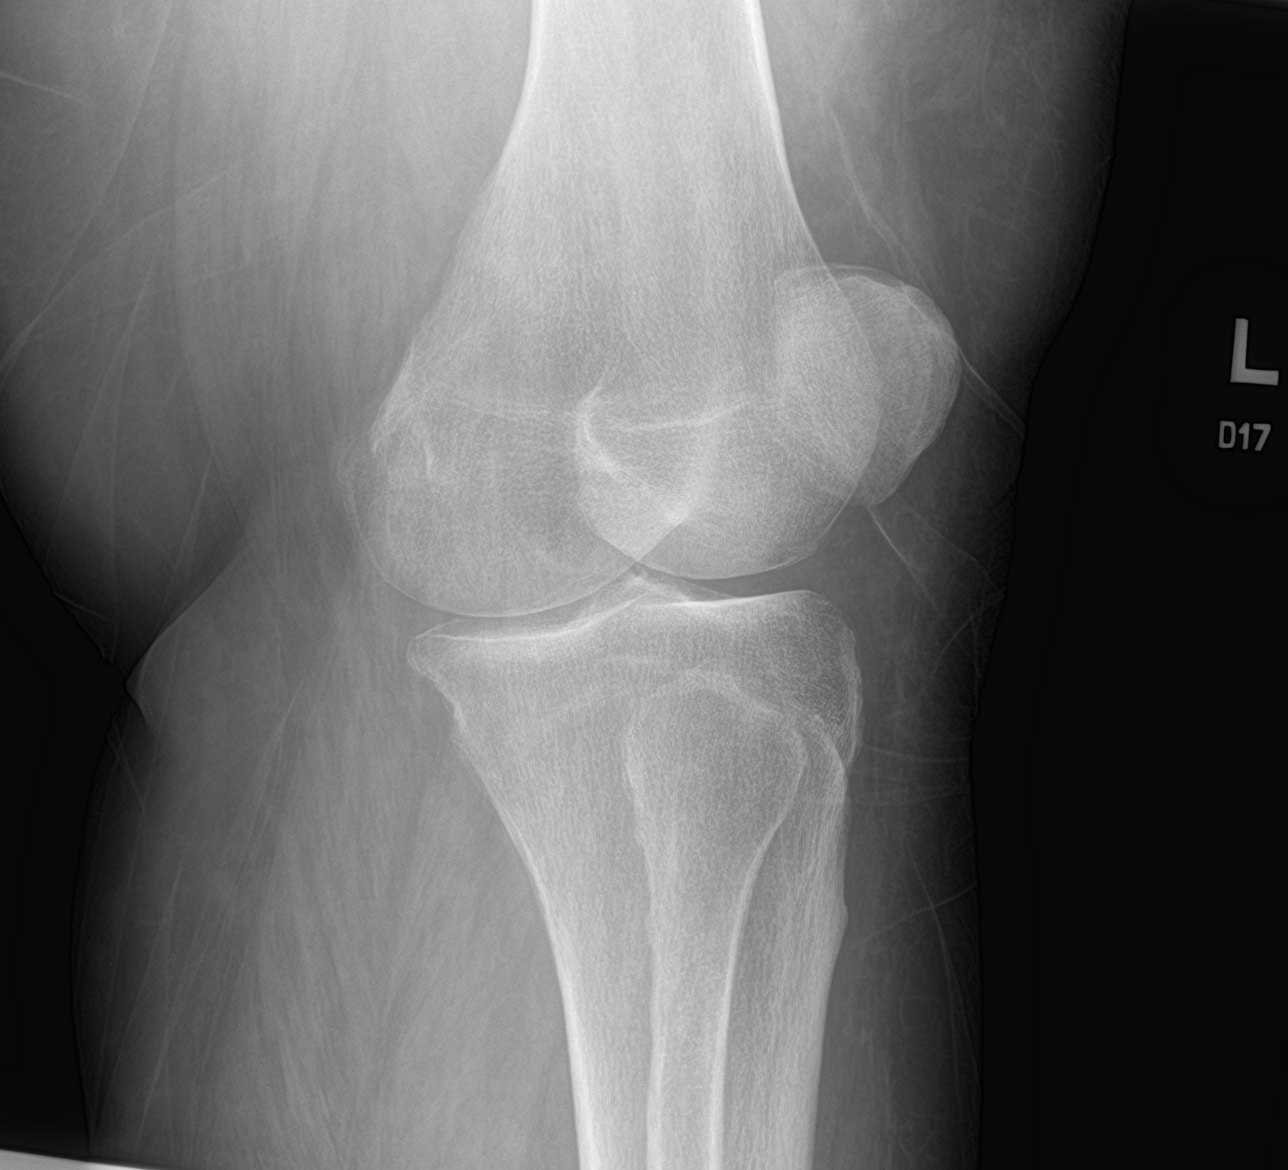
[im 3/4]
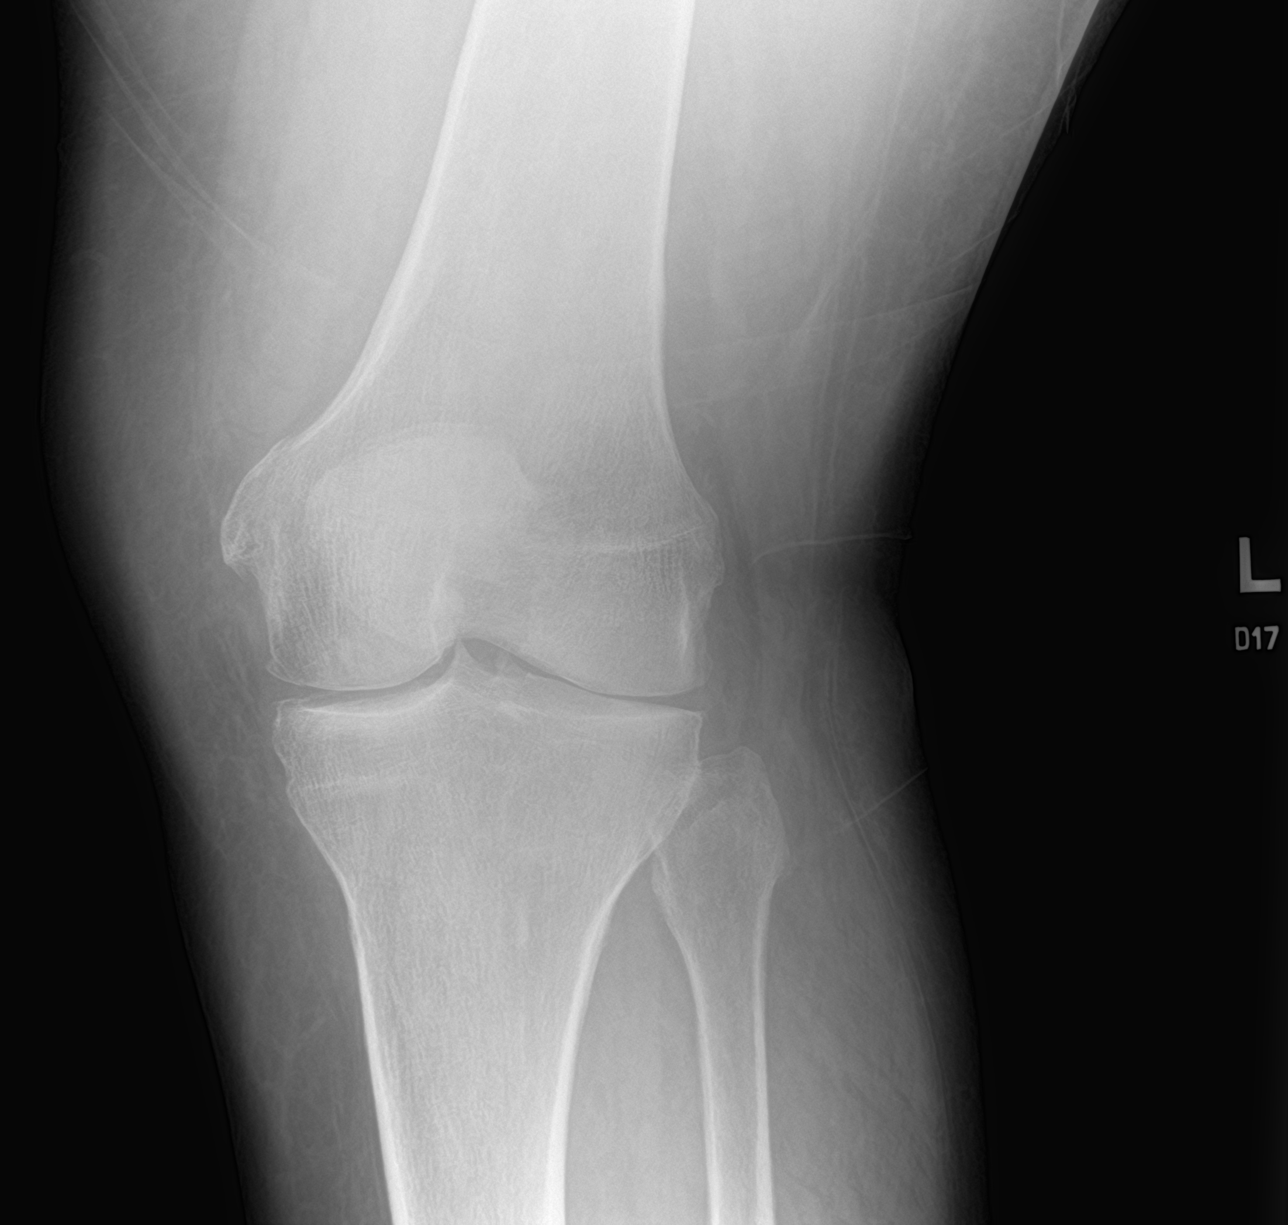
[im 4/4]
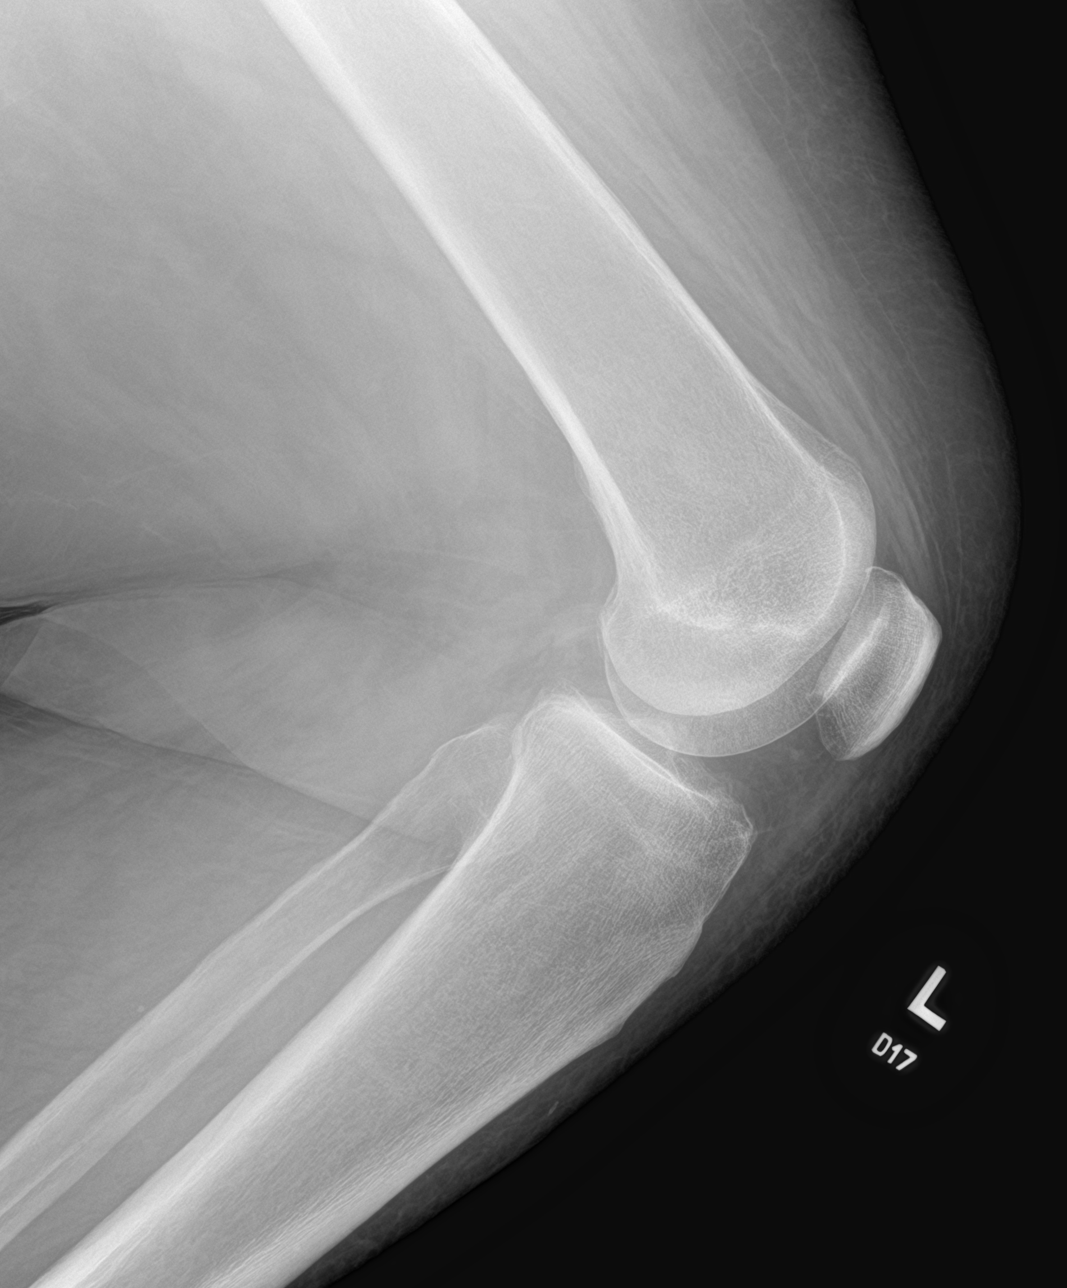

[4 of 4 positions shown; findings below may reference images not displayed]

FINDINGS: Normal alignment. No acute fracture or dislocation. Mild
bicompartmental degenerative arthritis involving the mediolateral
compartments with tiny osteophyte formation. Small osteochondroma
noted arising from the medial femoral condyle. No effusion. Soft
tissues are unremarkable.
IMPRESSION: Mild bicompartmental degenerative arthritis.

Small osteochondroma arising from the medial femoral condyle.

## 2021-02-24 NOTE — Progress Notes (Signed)
Thanks

## 2021-03-12 ENCOUNTER — Encounter: Payer: Self-pay | Admitting: *Deleted

## 2021-03-12 ENCOUNTER — Other Ambulatory Visit: Payer: Self-pay

## 2021-03-12 ENCOUNTER — Encounter: Payer: Medicare Other | Attending: Pain Medicine | Admitting: *Deleted

## 2021-03-12 VITALS — BP 120/82 | Ht 72.0 in | Wt 334.2 lb

## 2021-03-12 DIAGNOSIS — E1169 Type 2 diabetes mellitus with other specified complication: Secondary | ICD-10-CM | POA: Insufficient documentation

## 2021-03-12 DIAGNOSIS — Z713 Dietary counseling and surveillance: Secondary | ICD-10-CM | POA: Diagnosis not present

## 2021-03-12 DIAGNOSIS — Z6841 Body Mass Index (BMI) 40.0 and over, adult: Secondary | ICD-10-CM | POA: Diagnosis not present

## 2021-03-12 DIAGNOSIS — M159 Polyosteoarthritis, unspecified: Secondary | ICD-10-CM | POA: Insufficient documentation

## 2021-03-12 DIAGNOSIS — E119 Type 2 diabetes mellitus without complications: Secondary | ICD-10-CM

## 2021-03-12 NOTE — Patient Instructions (Signed)
Check blood sugars 1 x day before breakfast or 2 hrs after one meal every day Bring blood sugar records to the next appointment  Exercise:  Do chair exercises or walk as tolerated  Eat 3 meals day,   1-2  snacks a day Space meals 4-6 hours apart Don't skip meals - eat at least 1 protein and 1 carbohydrate serving  Carry fast acting glucose and a snack at all times Rotate injection sites Hold insulin pen in place for 5-10 seconds after injection  Return for appointment:  Friday April 16, 2021 at 10:30 am

## 2021-03-12 NOTE — Progress Notes (Signed)
Diabetes Self-Management Education  Visit Type: First/Initial  Appt. Start Time: 1345 Appt. End Time: 0938  03/12/2021  [name], identified by name and date of birth, is a 61 y.o. male with a diagnosis of Diabetes: Type 2.   ASSESSMENT  Blood pressure 120/82, height 6' (1.829 m), weight (!) 334 lb 3.2 oz (151.6 kg). Body mass index is 45.33 kg/m.   Diabetes Self-Management Education - 03/12/21 1616       Visit Information   Visit Type First/Initial      Initial Visit   Diabetes Type Type 2    Are you currently following a meal plan? Yes    What type of meal plan do you follow? "pushing back from the table"    Are you taking your medications as prescribed? Yes    Date Diagnosed 2 years ago      Health Coping   How would you rate your overall health? Poor      Psychosocial Assessment   Patient Belief/Attitude about Diabetes Motivated to manage diabetes    Self-care barriers Unsteady gait/risk for falls    Self-management support Doctor's office;Family    Patient Concerns Nutrition/Meal planning;Glycemic Control;Weight Control;Problem Solving;Healthy Lifestyle;Monitoring;Medication    Special Needs None    Preferred Learning Style Auditory;Visual;Hands on    [place] in progress    How often do you need to have someone help you when you read instructions, pamphlets, or other written materials from your doctor or pharmacy? 1 - Never    What is the last grade level you completed in school? 12th      Pre-Education Assessment   Patient understands the diabetes disease and treatment process. Needs Review    Patient understands incorporating nutritional management into lifestyle. Needs Instruction    Patient undertands incorporating physical activity into lifestyle. Needs Review    Patient understands using medications safely. Needs Review    Patient understands monitoring blood glucose, interpreting and using results Needs Review    Patient  understands prevention, detection, and treatment of acute complications. Needs Review    Patient understands prevention, detection, and treatment of chronic complications. Needs Review    Patient understands how to develop strategies to address psychosocial issues. Needs Review    Patient understands how to develop strategies to promote health/change behavior. Needs Review      Complications   Last HgB A1C per patient/outside source 5.7 %   12/09/2020   How often do you check your blood sugar? 1-2 times/day    Fasting Blood glucose range (mg/dL) 70-129   He reports FBG's 117-122 mg/dL   Have you had a dilated eye exam in the past 12 months? Yes    Have you had a dental exam in the past 12 months? Yes    Are you checking your feet? Yes    How many days per week are you checking your feet? 6      Dietary Intake   Breakfast sometimes he skips or eats sausage or bacon, occaisonal pancakes, sometimes a salad    Snack (morning) 1-2 snacks but not daily - chips, candy, cookies    Lunch [place] sandwich with lettuce, tomato and mayo    Dinner chicken, beef, fish, occasional pork; potaotes, peas, beans, corn, green beans, occasional rice or pasta, loves salads - lettuce tomatoes, cuccumbers, beets, broccoli, carrots, cauliflower, peppers    Beverage(s) mostly water and coffee      Exercise   Exercise Type ADL's;Light (walking / raking leaves)  sometimes chair exercises   How many days per week to you exercise? 7    How many minutes per day do you exercise? 10    Total minutes per week of exercise 70      Patient Education   Previous Diabetes Education Yes (please comment)   in [place]   Disease state  Explored patient's options for treatment of their diabetes    Nutrition management  Role of diet in the treatment of diabetes and the relationship between the three main macronutrients and blood glucose level;Food label reading, portion sizes and measuring food.;Reviewed blood glucose goals  for pre and post meals and how to evaluate the patients' food intake on their blood glucose level.;Meal timing in regards to the patients' current diabetes medication.    Physical activity and exercise  Role of exercise on diabetes management, blood pressure control and cardiac health.    Medications Taught/reviewed insulin injection, site rotation, insulin storage and needle disposal.;Reviewed patients medication for diabetes, action, purpose, timing of dose and side effects.    Monitoring Purpose and frequency of SMBG.;Taught/discussed recording of test results and interpretation of SMBG.;Identified appropriate SMBG and/or A1C goals.    Acute complications Taught treatment of hypoglycemia - the 15 rule.    Chronic complications Relationship between chronic complications and blood glucose control    Psychosocial adjustment Identified and addressed patients feelings and concerns about diabetes    Personal strategies to promote health Review risk of smoking and offered smoking cessation      Individualized Goals (developed by patient)   Reducing Risk Other (comment)   improve blood sugars, decrease medications, prevent diabetes complications, lose weight, lead a healthier lifestyle, quit smoking, become more fit     Outcomes   Expected Outcomes Demonstrated interest in learning. Expect positive outcomes    Future DMSE 4-6 wks        Individualized Plan for Diabetes Self-Management Training:   Learning Objective:  Patient will have a greater understanding of diabetes self-management. Patient education plan is to attend individual and/or group sessions per assessed needs and concerns.   Plan:   Patient Instructions  Check blood sugars 1 x day before breakfast or 2 hrs after one meal every day Bring blood sugar records to the next appointment  Exercise:  Do chair exercises or walk as tolerated  Eat 3 meals day,   1-2  snacks a day Space meals 4-6 hours apart Don't skip meals - eat at  least 1 protein and 1 carbohydrate serving  Carry fast acting glucose and a snack at all times Rotate injection sites Hold insulin pen in place for 5-10 seconds after injection  Return for appointment:  Friday April 16, 2021 at 10:30 am   Expected Outcomes:  Demonstrated interest in learning. Expect positive outcomes  Education material provided:  General Meal Planning Guidelines Simple Meal Plan Glucose tablets Symptoms, causes and treatments of Hypoglycemia Injection Guide (BD) Diabetes Plate Method (ADA)  If problems or questions, patient to contact team via:   Johny Drilling, RN, [place] [phone]  Future DSME appointment: 4-6 wks April 16, 2021 with this nurse

## 2021-03-15 ENCOUNTER — Other Ambulatory Visit: Payer: Self-pay | Admitting: Family Medicine

## 2021-03-15 DIAGNOSIS — E1169 Type 2 diabetes mellitus with other specified complication: Secondary | ICD-10-CM

## 2021-03-15 DIAGNOSIS — M159 Polyosteoarthritis, unspecified: Secondary | ICD-10-CM

## 2021-03-15 DIAGNOSIS — G894 Chronic pain syndrome: Secondary | ICD-10-CM

## 2021-03-15 NOTE — Telephone Encounter (Signed)
Copied from [place] [id number]. Topic: Quick Communication - Rx Refill/Question >> Mar 15, 2021 11:54 AM Tessa Lerner A wrote: Medication: insulin degludec ([name]) 100 UNIT/ML FlexTouch Pen [[id number]]   pregabalin (LYRICA) 150 MG capsule [[id number]]   Has the patient contacted their pharmacy? Yes.  The patient has been directed to contact their PCP  (Agent: If no, request that the patient contact the pharmacy for the refill. If patient does not wish to contact the pharmacy document the reason why and proceed with request.) (Agent: If yes, when and what did the pharmacy advise?)  Preferred Pharmacy (with phone number or street name): [facility] ([facility] ) - [place], [address] [place] [address] Phone: [phone] Fax: [phone] Hours: Not open 24 hours  Has the patient been seen for an appointment in the last year OR does the patient have an upcoming appointment? Yes.    Agent: Please be advised that RX refills may take up to 3 business days. We ask that you follow-up with your pharmacy.

## 2021-03-16 DIAGNOSIS — M79605 Pain in left leg: Secondary | ICD-10-CM | POA: Diagnosis not present

## 2021-03-16 DIAGNOSIS — M79604 Pain in right leg: Secondary | ICD-10-CM | POA: Diagnosis not present

## 2021-03-16 MED ORDER — INSULIN DEGLUDEC 100 UNIT/ML ~~LOC~~ SOPN: 25.0000 [IU] | PEN_INJECTOR | Freq: Every day | SUBCUTANEOUS | 1 refills | Status: DC

## 2021-03-16 MED ORDER — PREGABALIN 150 MG PO CAPS: ORAL_CAPSULE | ORAL | 1 refills | Status: DC

## 2021-03-16 NOTE — Telephone Encounter (Signed)
Requested medication (s) are due for refill today: yes  Requested medication (s) are on the active medication list: yes  Last refill:  Tyler Aas 12/09/20 #3/1, Lyrica 11/10/20  #90/2  Future visit scheduled: yes  Notes to clinic:  Unable to refill per protocol, cannot delegate.      Requested Prescriptions  Pending Prescriptions Disp Refills   insulin degludec ([name]) 100 UNIT/ML FlexTouch Pen 3 mL 1    Sig: Inject 20 Units into the skin daily.     Endocrinology:  Diabetes - Insulins Passed - 03/15/2021  4:54 PM      Passed - HBA1C is between 0 and 7.9 and within 180 days    Hemoglobin A1C  Date Value Ref Range Status  12/09/2020 5.7 (A) 4.0 - 5.6 % Final          Passed - Valid encounter within last 6 months    Recent Outpatient Visits           3 weeks ago Pain and swelling of toe of left foot   [place], DO   2 months ago DDD (degenerative disc disease), lumbar   [place], DO   2 months ago Chronic pain syndrome   [place], DO   3 months ago Type 2 diabetes mellitus with other specified complication, with long-term current use of insulin ([place])   [facility] Olin Hauser, DO   4 months ago Chronic pain syndrome   [place], DO       Future Appointments             In 3 months [place], Devonne Doughty, DO [facility]             pregabalin (LYRICA) 150 MG capsule 90 capsule 2    Sig: Take 2 caps for 300mg  in AM, and take 1 cap for 150mg  at night     Not Delegated - Neurology:  Anticonvulsants - Controlled - pregabalin Failed - 03/15/2021  4:54 PM      Failed - This refill cannot be delegated      Failed - Cr in normal range and within 360 days    Creatinine, Ser  Date Value Ref Range Status  02/15/2021 0.70 (L) 0.76 - 1.27 mg/dL Final           Passed - Completed PHQ-2 or PHQ-9 in the last 360 days      Passed - Valid encounter within last 12 months    Recent Outpatient Visits           3 weeks ago Pain and swelling of toe of left foot   [place], DO   2 months ago DDD (degenerative disc disease), lumbar   [place], DO   2 months ago Chronic pain syndrome   [place], DO   3 months ago Type 2 diabetes mellitus with other specified complication, with long-term current use of insulin [facility])   [place], DO   4 months ago Chronic pain syndrome   [place], DO       Future Appointments             In 3 months [place] Ranger, Devonne Doughty, DO [facility] [place]

## 2021-03-17 ENCOUNTER — Other Ambulatory Visit: Payer: Self-pay

## 2021-03-17 DIAGNOSIS — E1169 Type 2 diabetes mellitus with other specified complication: Secondary | ICD-10-CM

## 2021-03-17 MED ORDER — METFORMIN HCL 1000 MG PO TABS: 1000.0000 mg | ORAL_TABLET | Freq: Every day | ORAL | 1 refills | Status: DC

## 2021-03-17 MED ORDER — OZEMPIC (0.25 OR 0.5 MG/DOSE) 2 MG/1.5ML ~~LOC~~ SOPN: 0.5000 mg | PEN_INJECTOR | SUBCUTANEOUS | 1 refills | Status: DC

## 2021-03-19 ENCOUNTER — Ambulatory Visit (INDEPENDENT_AMBULATORY_CARE_PROVIDER_SITE_OTHER): Payer: Medicare Other

## 2021-03-19 VITALS — Ht 72.0 in | Wt 333.0 lb

## 2021-03-19 DIAGNOSIS — Z1211 Encounter for screening for malignant neoplasm of colon: Secondary | ICD-10-CM

## 2021-03-19 DIAGNOSIS — Z0000 Encounter for general adult medical examination without abnormal findings: Secondary | ICD-10-CM | POA: Diagnosis not present

## 2021-03-19 DIAGNOSIS — F3289 Other specified depressive episodes: Secondary | ICD-10-CM | POA: Diagnosis not present

## 2021-03-19 NOTE — Progress Notes (Signed)
Virtual Visit via Telephone Note  I connected with  James [name] on 03/19/21 at 11:40 AM EST by telephone and verified that I am speaking with the correct person using two identifiers.  Location: Patient: home Provider: [facility] Persons participating in the virtual visit: James [name]   I discussed the limitations, risks, security and privacy concerns of performing an evaluation and management service by telephone and the availability of in person appointments. The patient expressed understanding and agreed to proceed.  Interactive audio and video telecommunications were attempted between this nurse and patient, however failed, due to patient having technical difficulties OR patient did not have access to video capability.  We continued and completed visit with audio only.  Some vital signs may be absent or patient reported.   James David, LPN  Review of Systems           Objective:    Today's Vitals   03/19/21 1137  Weight: (!) 333 lb (151 kg)  Height: 6' (1.829 m)   Body mass index is 45.16 kg/m.  Advanced Directives 03/12/2021 02/15/2021 10/30/2020  Does Patient Have a Medical Advance Directive? No No No  Would patient like information on creating a medical advance directive? No - Patient declined No - Patient declined No - Patient declined    Current Medications (verified) Outpatient Encounter Medications as of 03/19/2021  Medication Sig   atorvastatin (LIPITOR) 40 MG tablet Take 1 tablet (40 mg total) by mouth daily.   hydrochlorothiazide (HYDRODIURIL) 25 MG tablet Take 1 tablet (25 mg total) by mouth daily.   insulin degludec ([name]) 100 UNIT/ML FlexTouch Pen Inject 25 Units into the skin daily.   losartan (COZAAR) 25 MG tablet Take 1 tablet (25 mg total) by mouth daily.   metFORMIN (GLUCOPHAGE) 1000 MG tablet Take 1 tablet (1,000 mg total) by mouth daily with breakfast.   oxyCODONE-acetaminophen (PERCOCET) 10-325 MG tablet Take 1 tablet by  mouth 3 (three) times daily as needed for pain.   OZEMPIC, 0.25 OR 0.5 MG/DOSE, 2 MG/1.5ML SOPN Inject 0.5 mg into the skin once a week.   pregabalin (LYRICA) 150 MG capsule Take 2 caps for 300mg  in AM, and take 1 cap for 150mg  at night   varenicline (CHANTIX CONTINUING MONTH PAK) 1 MG tablet Take 1 tablet (1 mg total) by mouth 2 (two) times daily.   No facility-administered encounter medications on file as of 03/19/2021.    Allergies (verified) Patient has no known allergies.   History: Past Medical History:  Diagnosis Date   Arthritis    Diabetes mellitus without complication ([place])    Hyperlipidemia    Hypertension    No past surgical history on file. Family History  Problem Relation Age of Onset   Heart disease Mother    Arthritis Mother    Arthritis Father    Social History   Socioeconomic History   Marital status: Single    Spouse name: Not on file   Number of children: Not on file   Years of education: Not on file   Highest education level: Not on file  Occupational History   Not on file  Tobacco Use   Smoking status: Some Days    Packs/day: 0.10    Years: 25.00    Pack years: 2.50    Types: Cigarettes   Smokeless tobacco: Never  Substance and Sexual Activity   Alcohol use: Never   Drug use: Not on file   Sexual activity: Not on file  Other Topics  Concern   Not on file  Social History Narrative   Not on file   Social Determinants of Health   Financial Resource Strain: Not on file  Food Insecurity: Not on file  Transportation Needs: Not on file  Physical Activity: Not on file  Stress: Not on file  Social Connections: Not on file    Tobacco Counseling Ready to quit: Not Answered Counseling given: Not Answered   Clinical Intake:  Pre-visit preparation completed: Yes  Pain : No/denies pain     Nutritional Risks: None Diabetes: Yes CBG done?: No Did pt. bring in CBG monitor from home?: No  How often do you need to have someone help you  when you read instructions, pamphlets, or other written materials from your doctor or pharmacy?: 1 - Never  Diabetic?yes Nutrition Risk Assessment:  Has the patient had any N/V/D within the last 2 months?  No  Does the patient have any non-healing wounds?  No  Has the patient had any unintentional weight loss or weight gain?  No   Diabetes:  Is the patient diabetic?  Yes  If diabetic, was a CBG obtained today?  No  Did the patient bring in their glucometer from home?  No  How often do you monitor your CBG's? Once a day.   Financial Strains and Diabetes Management:  Are you having any financial strains with the device, your supplies or your medication? No .  Does the patient want to be seen by Chronic Care Management for management of their diabetes?  No  Would the patient like to be referred to a Nutritionist or for Diabetic Management?  No   Diabetic Exams:  Diabetic Eye Exam: Completed no. Overdue for diabetic eye exam. Pt has been advised about the importance in completing this exam.  Declined referral* Diabetic Foot Exam: Completed 12/09/20. Pt has been advised about the importance in completing this exam.   Interpreter Needed?: No  Information entered by :: James Shaggy, LPN   Activities of Daily Living In your present state of health, do you have any difficulty performing the following activities: 01/15/2021  Hearing? N  Vision? N  Difficulty concentrating or making decisions? N  Walking or climbing stairs? Y  Dressing or bathing? Y  Doing errands, shopping? Y    Patient Care Team: Olin Hauser, DO as PCP - General (Family Medicine)  Indicate any recent Medical Services you may have received from other than [facility] providers in the past year (date may be approximate).     Assessment:   This is a routine wellness examination for James [name].  Hearing/Vision screen No results found.  Dietary issues and exercise activities discussed:     Goals  Addressed   None    Depression Screen PHQ 2/9 Scores 03/12/2021 02/15/2021 01/15/2021  PHQ - 2 Score 0 0 0  PHQ- 9 Score - - 6    Fall Risk Fall Risk  03/12/2021 02/15/2021 01/15/2021  Falls in the past year? 1 1 1   Number falls in past yr: 1 0 1  Injury with Fall? 0 0 0  Risk for fall due to : Impaired balance/gait;Impaired mobility - No Fall Risks  Follow up - - Falls evaluation completed    FALL RISK PREVENTION PERTAINING TO THE HOME:  Any stairs in or around the home? Yes  If so, are there any without handrails? No  Home free of loose throw rugs in walkways, pet beds, electrical cords, etc? Yes  Adequate lighting in your  home to reduce risk of falls? Yes   ASSISTIVE DEVICES UTILIZED TO PREVENT FALLS:  Life alert? No  Use of a cane, walker or w/c? Yes  Grab bars in the bathroom? No  Shower chair or bench in shower? No  Elevated toilet seat or a handicapped toilet? No    Cognitive Function:Normal cognitive status assessed by direct observation by this Nurse Health Advisor. No abnormalities found.          Immunizations Immunization History  Administered Date(s) Administered   Influenza,inj,Quad PF,6+ Mos 11/09/2020   Moderna Covid-19 Vaccine Bivalent Booster 37yrs & up 03/27/2020   PFIZER Comirnaty(Gray Top)Covid-19 Tri-Sucrose Vaccine 06/06/2019   PFIZER(Purple Top)SARS-COV-2 Vaccination 05/16/2019    TDAP status: Due, Education has been provided regarding the importance of this vaccine. Advised may receive this vaccine at local pharmacy or Health Dept. Aware to provide a copy of the vaccination record if obtained from local pharmacy or Health Dept. Verbalized acceptance and understanding.  Flu Vaccine status: Up to date  Pneumococcal vaccine status: Declined,  Education has been provided regarding the importance of this vaccine but patient still declined. Advised may receive this vaccine at local pharmacy or Health Dept. Aware to provide a copy of the vaccination  record if obtained from local pharmacy or Health Dept. Verbalized acceptance and understanding.   Covid-19 vaccine status: Completed vaccines  Qualifies for Shingles Vaccine? No   Zostavax completed No   Shingrix Completed?: No.    Education has been provided regarding the importance of this vaccine. Patient has been advised to call insurance company to determine out of pocket expense if they have not yet received this vaccine. Advised may also receive vaccine at local pharmacy or Health Dept. Verbalized acceptance and understanding.  Screening Tests Health Maintenance  Topic Date Due   OPHTHALMOLOGY EXAM  Never done   Hepatitis C Screening  Never done   TETANUS/TDAP  Never done   Zoster Vaccines- Shingrix (1 of 2) Never done   COLONOSCOPY (Pts 45-11yrs Insurance coverage will need to be confirmed)  Never done   COVID-19 Vaccine (3 - Pfizer risk series) 03/27/2020   HIV Screening  01/15/2022 (Originally 03/15/1975)   HEMOGLOBIN A1C  06/08/2021   FOOT EXAM  12/09/2021   INFLUENZA VACCINE  Completed   HPV VACCINES  Aged Out    Health Maintenance  Health Maintenance Due  Topic Date Due   OPHTHALMOLOGY EXAM  Never done   Hepatitis C Screening  Never done   TETANUS/TDAP  Never done   Zoster Vaccines- Shingrix (1 of 2) Never done   COLONOSCOPY (Pts 45-61yrs Insurance coverage will need to be confirmed)  Never done   COVID-19 Vaccine (3 - Pfizer risk series) 03/27/2020    Colorectal cancer screening: Referral to GI placed today. Pt aware the office will call re: appt.  Lung Cancer Screening: (Low Dose CT Chest recommended if Age 39-80 years, 30 pack-year currently smoking OR have quit w/in 15years.) does not qualify.    Additional Screening:  Hepatitis C Screening: does qualify; Completed no  Vision Screening: Recommended annual ophthalmology exams for early detection of glaucoma and other disorders of the eye. Is the patient up to date with their annual eye exam?  No  Who is  the provider or what is the name? none If pt is not established with a provider, would they like to be referred to a provider to establish care? No .   Dental Screening: Recommended annual dental exams for proper oral hygiene  Community Resource Referral / Chronic Care Management: CRR required this visit?  Yes   CCM required this visit?  No      Plan:     I have personally reviewed and noted the following in the patients chart:   Medical and social history Use of alcohol, tobacco or illicit drugs  Current medications and supplements including opioid prescriptions. Patient is currently taking opioid prescriptions. Information provided to patient regarding non-opioid alternatives. Patient advised to discuss non-opioid treatment plan with their provider. Functional ability and status Nutritional status Physical activity Advanced directives List of other physicians Hospitalizations, surgeries, and ER visits in previous 12 months Vitals Screenings to include cognitive, depression, and falls Referrals and appointments  In addition, I have reviewed and discussed with patient certain preventive protocols, quality metrics, and best practice recommendations. A written personalized care plan for preventive services as well as general preventive health recommendations were provided to patient.     James David, LPN   02/25/5745   Nurse Notes: Marlynn Perking

## 2021-03-19 NOTE — Patient Instructions (Addendum)
[name] , Thank you for taking time to come for your Medicare Wellness Visit. I appreciate your ongoing commitment to your health goals. Please review the following plan we discussed and let me know if I can assist you in the future.   Screening recommendations/referrals: Colonoscopy: referral sent Recommended yearly ophthalmology/optometry visit for glaucoma screening and checkup Recommended yearly dental visit for hygiene and checkup  Vaccinations: Influenza vaccine: 11/09/20 Pneumococcal vaccine: n/d Tdap vaccine: n/d Shingles vaccine: n/d   Covid-19: 05/16/19, 06/06/19, 03/27/20  Advanced directives: no  Conditions/risks identified: needs counseling  Next appointment: Follow up in one year for your annual wellness visit - 03/25/22 @ 10:20am by phone  Preventive Care 40-64 Years, Male Preventive care refers to lifestyle choices and visits with your health care provider that can promote health and wellness. What does preventive care include? A yearly physical exam. This is also called an annual well check. Dental exams once or twice a year. Routine eye exams. Ask your health care provider how often you should have your eyes checked. Personal lifestyle choices, including: Daily care of your teeth and gums. Regular physical activity. Eating a healthy diet. Avoiding tobacco and drug use. Limiting alcohol use. Practicing safe sex. Taking low-dose aspirin every day starting at age 72. What happens during an annual well check? The services and screenings done by your health care provider during your annual well check will depend on your age, overall health, lifestyle risk factors, and family history of disease. Counseling  Your health care provider may ask you questions about your: Alcohol use. Tobacco use. Drug use. Emotional well-being. Home and relationship well-being. Sexual activity. Eating habits. Work and work Statistician. Screening  You may have the following  tests or measurements: Height, weight, and BMI. Blood pressure. Lipid and cholesterol levels. These may be checked every 5 years, or more frequently if you are over 71 years old. Skin check. Lung cancer screening. You may have this screening every year starting at age 55 if you have a 30-pack-year history of smoking and currently smoke or have quit within the past 15 years. Fecal occult blood test (FOBT) of the stool. You may have this test every year starting at age 12. Flexible sigmoidoscopy or colonoscopy. You may have a sigmoidoscopy every 5 years or a colonoscopy every 10 years starting at age 1. Prostate cancer screening. Recommendations will vary depending on your family history and other risks. Hepatitis C blood test. Hepatitis B blood test. Sexually transmitted disease (STD) testing. Diabetes screening. This is done by checking your blood sugar (glucose) after you have not eaten for a while (fasting). You may have this done every 1-3 years. Discuss your test results, treatment options, and if necessary, the need for more tests with your health care provider. Vaccines  Your health care provider may recommend certain vaccines, such as: Influenza vaccine. This is recommended every year. Tetanus, diphtheria, and acellular pertussis (Tdap, Td) vaccine. You may need a Td booster every 10 years. Zoster vaccine. You may need this after age 53. Pneumococcal 13-valent conjugate (PCV13) vaccine. You may need this if you have certain conditions and have not been vaccinated. Pneumococcal polysaccharide (PPSV23) vaccine. You may need one or two doses if you smoke cigarettes or if you have certain conditions. Talk to your health care provider about which screenings and vaccines you need and how often you need them. This information is not intended to replace advice given to you by your health care provider. Make sure you discuss any  questions you have with your health care provider. Document  Released: 01/30/2015 Document Revised: 09/23/2015 Document Reviewed: 11/04/2014 Elsevier Interactive Patient Education  2017 [employer] Prevention in the Home Falls can cause injuries. They can happen to people of all ages. There are many things you can do to make your home safe and to help prevent falls. What can I do on the outside of my home? Regularly fix the edges of walkways and driveways and fix any cracks. Remove anything that might make you trip as you walk through a door, such as a raised step or threshold. Trim any bushes or trees on the path to your home. Use bright outdoor lighting. Clear any walking paths of anything that might make someone trip, such as rocks or tools. Regularly check to see if handrails are loose or broken. Make sure that both sides of any steps have handrails. Any raised decks and porches should have guardrails on the edges. Have any leaves, snow, or ice cleared regularly. Use sand or salt on walking paths during winter. Clean up any spills in your garage right away. This includes oil or grease spills. What can I do in the bathroom? Use night lights. Install grab bars by the toilet and in the tub and shower. Do not use towel bars as grab bars. Use non-skid mats or decals in the tub or shower. If you need to sit down in the shower, use a plastic, non-slip stool. Keep the floor dry. Clean up any water that spills on the floor as soon as it happens. Remove soap buildup in the tub or shower regularly. Attach bath mats securely with double-sided non-slip rug tape. Do not have throw rugs and other things on the floor that can make you trip. What can I do in the bedroom? Use night lights. Make sure that you have a light by your bed that is easy to reach. Do not use any sheets or blankets that are too big for your bed. They should not hang down onto the floor. Have a firm chair that has side arms. You can use this for support while you get dressed. Do  not have throw rugs and other things on the floor that can make you trip. What can I do in the kitchen? Clean up any spills right away. Avoid walking on wet floors. Keep items that you use a lot in easy-to-reach places. If you need to reach something above you, use a strong step stool that has a grab bar. Keep electrical cords out of the way. Do not use floor polish or wax that makes floors slippery. If you must use wax, use non-skid floor wax. Do not have throw rugs and other things on the floor that can make you trip. What can I do with my stairs? Do not leave any items on the stairs. Make sure that there are handrails on both sides of the stairs and use them. Fix handrails that are broken or loose. Make sure that handrails are as long as the stairways. Check any carpeting to make sure that it is firmly attached to the stairs. Fix any carpet that is loose or worn. Avoid having throw rugs at the top or bottom of the stairs. If you do have throw rugs, attach them to the floor with carpet tape. Make sure that you have a light switch at the top of the stairs and the bottom of the stairs. If you do not have them, ask someone to add them  for you. What else can I do to help prevent falls? Wear shoes that: Do not have high heels. Have rubber bottoms. Are comfortable and fit you well. Are closed at the toe. Do not wear sandals. If you use a stepladder: Make sure that it is fully opened. Do not climb a closed stepladder. Make sure that both sides of the stepladder are locked into place. Ask someone to hold it for you, if possible. Clearly mark and make sure that you can see: Any grab bars or handrails. First and last steps. Where the edge of each step is. Use tools that help you move around (mobility aids) if they are needed. These include: Canes. Walkers. Scooters. Crutches. Turn on the lights when you go into a dark area. Replace any light bulbs as soon as they burn out. Set up your  furniture so you have a clear path. Avoid moving your furniture around. If any of your floors are uneven, fix them. If there are any pets around you, be aware of where they are. Review your medicines with your doctor. Some medicines can make you feel dizzy. This can increase your chance of falling. Ask your doctor what other things that you can do to help prevent falls. This information is not intended to replace advice given to you by your health care provider. Make sure you discuss any questions you have with your health care provider. Document Released: 10/30/2008 Document Revised: 06/11/2015 Document Reviewed: 02/07/2014 Elsevier Interactive Patient Education  2017 [employer].

## 2021-03-22 ENCOUNTER — Other Ambulatory Visit: Payer: Self-pay

## 2021-03-22 DIAGNOSIS — Z1211 Encounter for screening for malignant neoplasm of colon: Secondary | ICD-10-CM

## 2021-03-22 MED ORDER — PEG 3350-KCL-NA BICARB-NACL 420 G PO SOLR: 4000.0000 mL | Freq: Once | ORAL | 0 refills | Status: AC

## 2021-03-22 NOTE — Progress Notes (Signed)
Gastroenterology Pre-Procedure Review ? ?Request Date: 04/19/2021 ?Requesting Physician: Dr. Vicente Males ? ?PATIENT REVIEW QUESTIONS: The patient responded to the following health history questions as indicated:   ? ?1. Are you having any GI issues? no ?2. Do you have a personal history of Polyps? no ?3. Do you have a family history of Colon Cancer or Polyps? no ?4. Diabetes Mellitus? yes (type 2) ?5. Joint replacements in the past 12 months?no ?6. Major health problems in the past 3 months?no ?7. Any artificial heart valves, MVP, or defibrillator?no ?   ?MEDICATIONS & ALLERGIES:    ?Patient reports the following regarding taking any anticoagulation/antiplatelet therapy:   ?Plavix, Coumadin, Eliquis, Xarelto, Lovenox, Pradaxa, Brilinta, or Effient? no ?Aspirin? no ? ?Patient confirms/reports the following medications:  ?Current Outpatient Medications  ?Medication Sig Dispense Refill  ? atorvastatin (LIPITOR) 40 MG tablet Take 1 tablet (40 mg total) by mouth daily. 30 tablet 11  ? hydrochlorothiazide (HYDRODIURIL) 25 MG tablet Take 1 tablet (25 mg total) by mouth daily. 90 tablet 1  ? insulin degludec ([name]) 100 UNIT/ML FlexTouch Pen Inject 25 Units into the skin daily. 15 mL 1  ? losartan (COZAAR) 25 MG tablet Take 1 tablet (25 mg total) by mouth daily. 30 tablet 11  ? metFORMIN (GLUCOPHAGE) 1000 MG tablet Take 1 tablet (1,000 mg total) by mouth daily with breakfast. 90 tablet 1  ? oxyCODONE-acetaminophen (PERCOCET) 10-325 MG tablet Take 1 tablet by mouth 3 (three) times daily as needed for pain. 90 tablet 0  ? OZEMPIC, 0.25 OR 0.5 MG/DOSE, 2 MG/1.5ML SOPN Inject 0.5 mg into the skin once a week. 4.5 mL 1  ? pregabalin (LYRICA) 150 MG capsule Take 2 caps for '300mg'$  in AM, and take 1 cap for '150mg'$  at night 270 capsule 1  ? varenicline (CHANTIX CONTINUING MONTH PAK) 1 MG tablet Take 1 tablet (1 mg total) by mouth 2 (two) times daily. 60 tablet 1  ? ?No current facility-administered medications for this visit.  ? ? ?Patient  confirms/reports the following allergies:  ?No Known Allergies ? ?No orders of the defined types were placed in this encounter. ? ? ?AUTHORIZATION INFORMATION ?Primary Insurance: ?1D#: ?Group #: ? ?Secondary Insurance: ?1D#: ?Group #: ? ?SCHEDULE INFORMATION: ?Date: 04/19/2021 ?Time: ?Location: ?[facility] ?

## 2021-03-23 ENCOUNTER — Other Ambulatory Visit: Payer: Self-pay | Admitting: Family Medicine

## 2021-03-23 ENCOUNTER — Telehealth: Payer: Self-pay

## 2021-03-23 DIAGNOSIS — G894 Chronic pain syndrome: Secondary | ICD-10-CM

## 2021-03-23 DIAGNOSIS — M5136 Other intervertebral disc degeneration, lumbar region: Secondary | ICD-10-CM

## 2021-03-23 DIAGNOSIS — M159 Polyosteoarthritis, unspecified: Secondary | ICD-10-CM

## 2021-03-23 DIAGNOSIS — G8929 Other chronic pain: Secondary | ICD-10-CM

## 2021-03-23 NOTE — Telephone Encounter (Signed)
Requested medication (s) are due for refill today: yes ? ?Requested medication (s) are on the active medication list: yes ? ?Last refill:  02/19/21 #90 with 0 RF ? ?Future visit scheduled: 06/25/21 ? ?Notes to clinic:  This medication can not be delegated, please assess.  ? ? ? ?  ? ?Requested Prescriptions  ?Pending Prescriptions Disp Refills  ? oxyCODONE-acetaminophen (PERCOCET) 10-325 MG tablet 90 tablet 0  ?  Sig: Take 1 tablet by mouth 3 (three) times daily as needed for pain.  ?  ? Not Delegated - Analgesics:  Opioid Agonist Combinations Failed - 03/23/2021  1:03 PM  ?  ?  Failed - This refill cannot be delegated  ?  ?  Failed - Urine Drug Screen completed in last 360 days  ?  ?  Passed - Valid encounter within last 3 months  ?  Recent Outpatient Visits   ? ?      ? 1 month ago Pain and swelling of toe of left foot  ? [place], DO  ? 2 months ago DDD (degenerative disc disease), lumbar  ? Pink, DO  ? 2 months ago Chronic pain syndrome  ? [place], DO  ? 3 months ago Type 2 diabetes mellitus with other specified complication, with long-term current use of insulin ([place])  ? [place], DO  ? 4 months ago Chronic pain syndrome  ? [place], DO  ? ?  ?  ?Future Appointments   ? ?        ? In 3 months [place], Devonne Doughty, DO [facility], [place]  ? ?  ? ?  ?  ?  ? ? ?

## 2021-03-23 NOTE — Telephone Encounter (Signed)
Medication Refill - Medication: oxyCODONE-acetaminophen (PERCOCET) 10-325 MG tablet ? ?Has the patient contacted their pharmacy? Yes.   ?Pharmacy told him they cannot request ? ?Preferred Pharmacy (with phone number or street name): [id number] [place] [address] ? ?Has the patient been seen for an appointment in the last year OR does the patient have an upcoming appointment? Yes.   ? ?Agent: Please be advised that RX refills may take up to 3 business days. We ask that you follow-up with your pharmacy. ? ?

## 2021-03-23 NOTE — Chronic Care Management (AMB) (Signed)
?  Chronic Care Management  ? ?Outreach Note ? ?03/23/2021 ?Name: [name] MRN: [id number] DOB: 1960/02/20 ? ?James [name] is a 61 y.o. year old male who is a primary care patient of Olin Hauser, DO. I reached out to [name] by phone today in response to a referral sent by [name] primary care provider. ? ?An unsuccessful telephone outreach was attempted today. The patient was referred to the case management team for assistance with care management and care coordination.  ? ?Follow Up Plan: A HIPAA compliant phone message was left for the patient providing contact information and requesting a return call.  ?The care management team will reach out to the patient again over the next 5 days.  ?If patient returns call to provider office, please advise to call [facility] * at [phone]* ? ?Noreene Larsson, RMA ?Care Guide, Embedded Care Coordination ?  Care Management  ?[place],  [id number] ?Direct Dial: [phone] ?Museum/[email or web address] ?Website: [email or web address]  ? ?

## 2021-03-24 MED ORDER — OXYCODONE-ACETAMINOPHEN 10-325 MG PO TABS: 1.0000 | ORAL_TABLET | Freq: Three times a day (TID) | ORAL | 0 refills | Status: DC | PRN

## 2021-03-24 NOTE — Telephone Encounter (Signed)
Copied from [place] [id number]. Topic: General - Other ?>> Mar 24, 2021 10:15 AM Tessa Lerner A wrote: ?Reason for CRM: The patient would like to speak with a member of staff when possible  ? ?The patient has concerns related to the refill of their oxyCODONE-acetaminophen (PERCOCET) 10-325 MG tablet [[id number]]  ? ?The patient has been in contact with their pharmacy and was told that no prescription was submitted for them  ? ?Please contact further when possible ?

## 2021-03-28 NOTE — Progress Notes (Signed)
PROVIDER NOTE: Information contained herein reflects review and annotations entered in association with encounter. Interpretation of such information and data should be left to medically-trained personnel. Information provided to patient can be located elsewhere in the medical record under "Patient Instructions". Document created using STT-dictation technology, any transcriptional errors that may result from process are unintentional.    Patient: James [name]  Service Category: E/M  Provider: Gaspar Cola, MD  DOB: [birth year]  DOS: 03/29/2021  Specialty: Interventional Pain Management  MRN: [id number]  Setting: Ambulatory outpatient  PCP: James Hauser, DO  Type: Established Patient    Referring Provider: Nobie Putnam *  Location: Office  Delivery: Face-to-face     Primary Reason(s) for Visit: Encounter for evaluation before starting new chronic pain management plan of care (Level of risk: moderate) CC: Leg Pain (Bilateral )  HPI  James [name] is a 61 y.o. year old, male patient, who comes today for a follow-up evaluation to review the test results and decide on a treatment plan. He has Chronic pain syndrome; DDD (degenerative disc disease), lumbar; Primary osteoarthritis involving multiple joints; Diabetes mellitus ([place]); Weakness of lower extremities (Bilateral); Morbid obesity with BMI of 50.0-59.9, adult ([place]); Recurrent falls; Pharmacologic therapy; Disorder of skeletal system; Problems influencing health status; Chronic use of opiate for therapeutic purpose; Type 2 diabetes mellitus with morbid obesity ([place]); Chronic lower extremity pain (1ry area of Pain) (Bilateral); Chronic low back pain (2ry area of Pain) (Bilateral) w/ sciatica (Bilateral); Chronic shoulder pain (3ry area of Pain) (Left); Chronic hip pain (Bilateral); Chronic knee pain (Bilateral); Primary osteoarthritis of hips (Bilateral); Osteoarthritis of hip (Left); Osteoarthritis of hip (Right);  Osteoarthritis of knee (Left); Elevated sed rate; Vitamin D deficiency; Elevated C-reactive protein (CRP); Lumbosacral radiculopathy at L5 (Left); and Fall (03/22/2021) on their problem list. His primarily concern today is the Leg Pain (Bilateral )  Pain Assessment: Location: Left, Right Leg Radiating: leg  pain into lower back and top of left foot Onset: More than a month ago Duration: Chronic pain Quality: Discomfort, Constant, Aching Severity: 10-Worst pain ever/10 (subjective, self-reported pain score)  Effect on ADL: patient has difficult time with standing Timing: Intermittent Modifying factors: nothing currently BP: 126/75   HR: 92  James [name] comes in today for a follow-up visit after his initial evaluation on 02/15/2021. Today we went over the results of his tests. These were explained in "Layman's terms". During today's appointment we went over my diagnostic impression, as well as the proposed treatment plan.  Review of initial evaluation (02/15/2021): "According to the patient the primary area of pain is that of the lower extremities (Bilateral) (R>L).  He denies any surgeries, he does indicate having had some physical therapy in [place], but not only did not help, but made the pain worse.  The patient denies any recent x-rays.  He indicates having had nerve blocks done in [place], but no notes are available at this time.  He indicates having been to a pain clinic in [place]: Dr. Darlen Round T. Hagos.  [facility].  [address], [place] [address].  Telephone number [phone].  Fax number: [phone].  The distribution of the pain in the right lower extremity goes down through the lateral anterior aspect of the leg to the area of the ankle.  He refers that the pain is all around the leg.  In the case of the left lower extremity the pain goes all the way down to the top  of the foot and what appears to be an L5 dermatomal distribution.   He still refers that pain to also be going down all around the leg.   The patient's second area of pain is the hips (Bilateral) (R>L).  The patient denies having had any kind of surgeries but again indicates having had physical therapy which did not help.  He does indicate having been told in [place] that he would be needing bilateral hip replacement.  There are no available x-rays or imaging studies of the patient.  He used to live in [place], but apparently moved to [place] approximately 4 to 5 months ago.  The patient's third area pain is that of the knees (Bilateral) (R>L).  Again the patient denies any prior surgeries but indicates having had physical therapy and he also indicates that he was also told that he would be needing knee replacements.  There are no imaging studies available at this time.  The patient has fourth area pain that of the lower back (Bilateral) (Midline) (R>L).  The patient indicates never having had back surgery, but does admit to having had physical therapy in [place] which not only did not help but made things worse.  The patient also indicates having had some x-rays done in [place], but none are available at this time.  The patient also indicates having had nerve blocks including having had "nerves burned".  He also indicates the pain to be referred towards his buttocks with the right buttock hurting more than the left.  The patient's fifth area pain is that of the left shoulder.  He points at his suprascapular region.  No prior surgeries or recent x-rays.  He denies any physical therapy or joint injections.  Physical exam shows a morbidly obese male with a weight of 375 pounds and using a walker to ambulate.  He is unable to stand up straight and has a lot of difficulty moving from the sitting position to standing with a walker.  He refers having quite a bit of lower extremity weakness on both sides.  He is also unable to tolerate standing straight.  He refers  worsening of the hip pain and leg pain when he stands up straight suggesting spinal stenosis.  Today he was unable to do any significant walking such as toe walking or heel walking.  He does seem to have weakness that could be associated with deconditioning."  Today we went over the results of his lab work and x-rays and I have explained those results to him in layman's terms.  I have also stressed the fact that his osteoarthritis is very likely secondary to his weight.  He currently weighs 333 pounds (BMI of 45.16 kg/m).  I have explained to him that according to pain management studies, his goal is to bring his BMI to 30.0 kg/m.  Since he is 6 feet tall this means approximately 220 pounds.  For some unknown reason his x-rays of the lumbar spine and shoulder were not done on his initial evaluation and therefore today I have entered orders for those again.  The x-rays of his hips demonstrate severe osteoarthritis of both hips with nearly complete effacement of the intra-articular space suggesting "bone-on-bone".  For this reason I will be ordering MRIs of the hip and I will also be referring him to orthopedic surgery for evaluation.  Today I will be putting the patient back on oxycodone IR 10 mg p.o. twice daily, but I have explained to him that as part  of our agreement, he needs to demonstrate to me that he is losing weight in order for me to continue writing the prescription.  We talked about what this would look like and to do it in an organized manner, we should expect a 10% drop in his weight (33 pounds) within the next year.  If we then divide this 33 pounds by 12 months, then we have that he should be losing a minimum of 2.75 pounds per month, in order for me to continue prescribing the pain medication.  He understood and accepted.  The purpose of this is essentially to incentivize him to work on losing the weight.  He has been made aware of the fact that it is highly unlikely that he will be able to  do this through exercise and therefore he needs to work on dieting.  Since he comes in today with quite a bit of pain, I had the nursing staff give him a Toradol/Norflex 60/60 mg IM injection.  We kept him under observation and he seems to have helped with some of his pain.  Doing this allowed [place] to talk to him in a more efficient manner since he was not fixated on the issue of his pain flareup.  Today he has confirmed his worst pain to be that of the lower extremities.  In considering the treatment plan options, Mr. Fullilove was reminded that I no longer take patients for medication management only. I asked him to let me know if he had no intention of taking advantage of the interventional therapies, so that we could make arrangements to provide this space to someone interested. I also made it clear that undergoing interventional therapies for the purpose of getting pain medications is very inappropriate on the part of a patient, and it will not be tolerated in this practice. This type of behavior would suggest true addiction and therefore it requires referral to an addiction specialist.   Further details on both, my assessment(s), as well as the proposed treatment plan, please see below.  Today we had the patient's sign a medication agreement and we have provided him with a prescription for oxycodone IR 10 mg to be taken 1 tablet p.o. twice daily.  Today we have also provided him with written information about our rules and regulations and we have reminded him that he is responsible for carefully reading and studying those.  Controlled Substance Pharmacotherapy Assessment REMS (Risk Evaluation and Mitigation Strategy)  Opioid Analgesic: Oxycodone/APAP 10/325 (# 60/month), 1 tab p.o. twice daily (last filled on 02/03/2021) Needs to loose an average of 2.75 lbs/mo to be considered for opioid pharmacotherapy.  MME/day: 30 mg/day  Pill Count: None expected due to no prior prescriptions written by our  practice. Janett Billow, RN  03/29/2021  9:49 AM  Sign when Signing Visit Patient lives with his brother.  Is trying to get his own place and then he will live alone.  States some days he is unable to make it to the kitchen to cook and has to do without eating.  When asked about Meals on Wheels and those type services, he states he was told he is not eligible for the services.    Patient had a fall last week, did not go for evaluation. States he hurt his legs but does not feel as if there is a fx.   Janett Billow, RN  03/29/2021  9:42 AM  Sign when Signing Visit Safety precautions to be maintained throughout the  outpatient stay will include: orient to surroundings, keep bed in low position, maintain call bell within reach at all times, provide assistance with transfer out of bed and ambulation.    Pharmacokinetics: Liberation and absorption (onset of action): WNL Distribution (time to peak effect): WNL Metabolism and excretion (duration of action): WNL         Pharmacodynamics: Desired effects: Analgesia: Mr. Alcocer reports >50% benefit. Functional ability: Patient reports that medication allows him to accomplish basic ADLs Clinically meaningful improvement in function (CMIF): Sustained CMIF goals met Perceived effectiveness: Described as relatively effective, allowing for increase in activities of daily living (ADL) Undesirable effects: Side-effects or Adverse reactions: None reported Monitoring: [place] PMP: PDMP reviewed during this encounter. Online review of the past 42-monthperiod previously conducted. Not applicable at this point since we have not taken over the patient's medication management yet. List of other Serum/Urine Drug Screening Test(s):  No results found for: AMPHSCRSER, BARBSCRSER, BENZOSCRSER, COCAINSCRSER, COCAINSCRNUR, PCPSCRSER, THCSCRSER, THCU, CANNABQUANT, OPIATESCRSER, O[place] PNash EColumbiaList of all UDS test(s) done:  Lab Results  Component  Value Date   SUMMARY Note 02/15/2021   Last UDS on record: Summary  Date Value Ref Range Status  02/15/2021 Note  Final    Comment:    ==================================================================== Compliance Drug Analysis, Ur ==================================================================== Test                             Result       Flag       Units  Drug Present and Declared for Prescription Verification   Oxycodone                      3020         EXPECTED   ng/mg creat   Oxymorphone                    1094         EXPECTED   ng/mg creat   Noroxycodone                   3969         EXPECTED   ng/mg creat   Noroxymorphone                 265          EXPECTED   ng/mg creat    Sources of oxycodone are scheduled prescription medications.    Oxymorphone, noroxycodone, and noroxymorphone are expected    metabolites of oxycodone. Oxymorphone is also available as a    scheduled prescription medication.    Pregabalin                     PRESENT      EXPECTED   Acetaminophen                  PRESENT      EXPECTED ==================================================================== Test                      Result    Flag   Units      Ref Range   Creatinine              172              mg/dL      >=20 ==================================================================== Declared Medications:  The flagging and interpretation on this report are  based on the  following declared medications.  Unexpected results may arise from  inaccuracies in the declared medications.   **Note: The testing scope of this panel includes these medications:   Oxycodone (Percocet)  Pregabalin (Lyrica)   **Note: The testing scope of this panel does not include small to  moderate amounts of these reported medications:   Acetaminophen (Percocet)   **Note: The testing scope of this panel does not include the  following reported medications:   Atorvastatin (Lipitor)  Hydrochlorothiazide  (Hydrodiuril)  Insulin Tyler Aas)  Losartan (Cozaar)  Metformin (Glucophage)  Semaglutide (Ozempic) ==================================================================== For clinical consultation, please call [phone]. ====================================================================    UDS interpretation: No unexpected findings.          Medication Assessment Form: Patient introduced to form today Treatment compliance: Treatment may start today if patient agrees with proposed plan. Evaluation of compliance is not applicable at this point Risk Assessment Profile: Aberrant behavior: See initial evaluations. None observed or detected today Comorbid factors increasing risk of overdose: See initial evaluation. No additional risks detected today Opioid risk tool (ORT):  Opioid Risk  02/15/2021  Alcohol 0  Illegal Drugs 0  Rx Drugs 0  Alcohol 0  Illegal Drugs 0  Rx Drugs 0  Age between 16-45 years  0  History of Preadolescent Sexual Abuse 0  Psychological Disease 0  Depression 0  Opioid Risk Tool Scoring 0  Opioid Risk Interpretation Low Risk    ORT Scoring interpretation table:  Score <3 = Low Risk for SUD  Score between 4-7 = Moderate Risk for SUD  Score >8 = High Risk for Opioid Abuse   Risk of substance use disorder (SUD): Low  Risk Mitigation Strategies:  Patient opioid safety counseling: Completed today. Counseling provided to patient as per "Patient Counseling Document". Document signed by patient, attesting to counseling and understanding Patient-Prescriber Agreement (PPA): Obtained today.  Controlled substance notification to other providers: Written and sent today.  Pharmacologic Plan: Non-opioid analgesic therapy offered. Interventional alternatives discussed.             Laboratory Chemistry Profile   Renal Lab Results  Component Value Date   BUN 8 02/15/2021   CREATININE 0.70 (L) 02/15/2021   BCR 11 02/15/2021   GFRNONAA >60 10/30/2020      Electrolytes Lab Results  Component Value Date   NA 137 02/15/2021   K 4.7 02/15/2021   CL 97 02/15/2021   CALCIUM 9.9 02/15/2021   MG 1.8 02/15/2021     Hepatic Lab Results  Component Value Date   AST 6 02/15/2021   ALT 6 10/30/2020   ALBUMIN 4.1 02/15/2021   ALKPHOS 116 02/15/2021     ID No results found for: LYMEIGGIGMAB, HIV, SARSCOV2NAA, STAPHAUREUS, MRSAPCR, HCVAB, PREGTESTUR, RMSFIGG, QFVRPH1IGG, QFVRPH2IGG, LYMEIGGIGMAB   Bone Lab Results  Component Value Date   25OHVITD1 13 (L) 02/15/2021   25OHVITD2 <1.0 02/15/2021   25OHVITD3 12 02/15/2021     Endocrine Lab Results  Component Value Date   GLUCOSE 95 02/15/2021   HGBA1C 5.7 (A) 12/09/2020     Neuropathy Lab Results  Component Value Date   VITAMINB12 217 (L) 02/15/2021   HGBA1C 5.7 (A) 12/09/2020     CNS No results found for: COLORCSF, APPEARCSF, RBCCOUNTCSF, WBCCSF, POLYSCSF, LYMPHSCSF, EOSCSF, PROTEINCSF, GLUCCSF, JCVIRUS, CSFOLI, IGGCSF, LABACHR, ACETBL, LABACHR, ACETBL   Inflammation (CRP: Acute   ESR: Chronic) Lab Results  Component Value Date   CRP 29 (H) 02/15/2021   ESRSEDRATE 39 (H) 02/15/2021  Rheumatology Lab Results  Component Value Date   LABURIC 6.3 02/19/2021     Coagulation Lab Results  Component Value Date   PLT 453 (H) 10/30/2020     Cardiovascular Lab Results  Component Value Date   HGB 14.4 10/30/2020   HCT 43.4 10/30/2020     Screening No results found for: SARSCOV2NAA, COVIDSOURCE, STAPHAUREUS, MRSAPCR, HCVAB, HIV, PREGTESTUR   Cancer No results found for: CEA, CA125, LABCA2   Allergens No results found for: ALMOND, APPLE, ASPARAGUS, AVOCADO, BANANA, BARLEY, BASIL, BAYLEAF, GREENBEAN, LIMABEAN, WHITEBEAN, BEEFIGE, REDBEET, BLUEBERRY, BROCCOLI, CABBAGE, MELON, CARROT, CASEIN, CASHEWNUT, CAULIFLOWER, CELERY     Note: Lab results reviewed.  Recent Diagnostic Imaging Review  Hip Imaging: Hip-R DG 2-3 views: Results for orders placed during the hospital  encounter of 02/23/21 DG HIP UNILAT W OR W/O PELVIS 2-3 VIEWS RIGHT  Narrative CLINICAL DATA:  Chronic right hip pain  EXAM: DG HIP (WITH OR WITHOUT PELVIS) 2-3V RIGHT  COMPARISON:  None.  FINDINGS: There is severe bilateral degenerative hip arthritis with near complete loss of the joint space and subchondral cyst formation, right greater than left. No acute fracture or dislocation. Sacroiliac joint spaces are preserved. Soft tissues are unremarkable.  IMPRESSION: Severe bilateral degenerative hip arthritis, right greater than left.   Electronically Signed By: Fidela Salisbury M.D. On: 02/24/2021 02:15  Hip-L DG 2-3 views: Results for orders placed during the hospital encounter of 02/23/21 DG HIP UNILAT W OR W/O PELVIS 2-3 VIEWS LEFT  Narrative CLINICAL DATA:  Chronic left hip pain  EXAM: DG HIP (WITH OR WITHOUT PELVIS) 2-3V LEFT  COMPARISON:  None.  FINDINGS: Severe bilateral degenerative hip arthritis with near complete loss of the joint space. Subchondral cyst formation noted within the left femoral head. Sacroiliac joint spaces are preserved. Soft tissues are unremarkable.  IMPRESSION: Severe left hip degenerative arthritis.   Electronically Signed By: Fidela Salisbury M.D. On: 02/24/2021 02:17  Knee Imaging: Knee-R DG 4 views: Results for orders placed during the hospital encounter of 02/23/21 DG Knee Complete 4 Views Right  Narrative CLINICAL DATA:  Right knee pain/arthralgia  EXAM: RIGHT KNEE - COMPLETE 4+ VIEW  COMPARISON:  None.  FINDINGS: No evidence of fracture, dislocation, or joint effusion. No evidence of arthropathy or other focal bone abnormality. Soft tissues are unremarkable.  IMPRESSION: Negative.   Electronically Signed By: Fidela Salisbury M.D. On: 02/24/2021 02:17  Knee-L DG 4 views: Results for orders placed during the hospital encounter of 02/23/21 DG Knee Complete 4 Views Left  Narrative CLINICAL DATA:  Left knee  pain/arthralgia  EXAM: LEFT KNEE - COMPLETE 4+ VIEW  COMPARISON:  None.  FINDINGS: Normal alignment. No acute fracture or dislocation. Mild bicompartmental degenerative arthritis involving the mediolateral compartments with tiny osteophyte formation. Small osteochondroma noted arising from the medial femoral condyle. No effusion. Soft tissues are unremarkable.  IMPRESSION: Mild bicompartmental degenerative arthritis.  Small osteochondroma arising from the medial femoral condyle.   Electronically Signed By: Fidela Salisbury M.D. On: 02/24/2021 02:19  Complexity Note: Imaging results reviewed. Results shared with Mr. Mcminn, using Layman's terms.                        Meds   Current Outpatient Medications:    atorvastatin (LIPITOR) 40 MG tablet, Take 1 tablet (40 mg total) by mouth daily., Disp: 30 tablet, Rfl: 11   Cholecalciferol (VITAMIN D3) 125 MCG (5000 UT) CAPS, Take 1 capsule (5,000 Units total) by mouth  daily with breakfast. Take along with calcium and magnesium., Disp: 30 capsule, Rfl: 2   ergocalciferol (VITAMIN D2) 1.25 MG (50000 UT) capsule, Take 1 capsule (50,000 Units total) by mouth 2 (two) times a week. X 6 weeks., Disp: 12 capsule, Rfl: 0   hydrochlorothiazide (HYDRODIURIL) 25 MG tablet, Take 1 tablet (25 mg total) by mouth daily., Disp: 90 tablet, Rfl: 1   insulin degludec ([name]) 100 UNIT/ML FlexTouch Pen, Inject 25 Units into the skin daily., Disp: 15 mL, Rfl: 1   losartan (COZAAR) 25 MG tablet, Take 1 tablet (25 mg total) by mouth daily., Disp: 30 tablet, Rfl: 11   metFORMIN (GLUCOPHAGE) 1000 MG tablet, Take 1 tablet (1,000 mg total) by mouth daily with breakfast., Disp: 90 tablet, Rfl: 1   Oxycodone HCl 10 MG TABS, Take 1 tablet (10 mg total) by mouth in the morning and at bedtime. Must last 30 days., Disp: 60 tablet, Rfl: 0   OZEMPIC, 0.25 OR 0.5 MG/DOSE, 2 MG/1.5ML SOPN, Inject 0.5 mg into the skin once a week., Disp: 4.5 mL, Rfl: 1   pregabalin  (LYRICA) 150 MG capsule, Take 2 caps for 381m in AM, and take 1 cap for 1517mat night, Disp: 270 capsule, Rfl: 1   varenicline (CHANTIX CONTINUING MONTH PAK) 1 MG tablet, Take 1 tablet (1 mg total) by mouth 2 (two) times daily., Disp: 60 tablet, Rfl: 1  ROS  Constitutional: Denies any fever or chills Gastrointestinal: No reported hemesis, hematochezia, vomiting, or acute GI distress Musculoskeletal: Denies any acute onset joint swelling, redness, loss of ROM, or weakness Neurological: No reported episodes of acute onset apraxia, aphasia, dysarthria, agnosia, amnesia, paralysis, loss of coordination, or loss of consciousness  Allergies  Mr. StFustonas No Known Allergies.  PFBowling GreenDrug: Mr. StKandelhas no history on file for drug use. Alcohol:  reports no history of alcohol use. Tobacco:  reports that he has been smoking cigarettes. He has a 2.50 pack-year smoking history. He has never used smokeless tobacco. Medical:  has a past medical history of Arthritis, Diabetes mellitus without complication ([place] Hyperlipidemia, and Hypertension. Surgical: Mr. StPrabhuhas no past surgical history on file. Family: family history includes Arthritis in his father and mother; Heart disease in his mother.  Constitutional Exam  General appearance: Well nourished, well developed, and well hydrated. In no apparent acute distress Vitals:   03/29/21 0942  BP: 126/75  Pulse: 92  Resp: 16  Temp: 98 F (36.7 C)  TempSrc: Temporal  SpO2: 97%  Weight: (!) 371 lb (168.3 kg)  Height: 6' (1.829 m)   BMI Assessment: Estimated body mass index is 50.32 kg/m as calculated from the following:   Height as of this encounter: 6' (1.829 m).   Weight as of this encounter: 371 lb (168.3 kg).  BMI interpretation table: BMI level Category Range association with higher incidence of chronic pain  <18 kg/m2 Underweight   18.5-24.9 kg/m2 Ideal body weight   25-29.9 kg/m2 Overweight Increased incidence by  20%  30-34.9 kg/m2 Obese (Class I) Increased incidence by 68%  35-39.9 kg/m2 Severe obesity (Class II) Increased incidence by 136%  >40 kg/m2 Extreme obesity (Class III) Increased incidence by 254%   Patient's current BMI Ideal Body weight  Body mass index is 50.32 kg/m. Ideal body weight: 77.6 kg (171 lb 1.2 oz) Adjusted ideal body weight: 113.9 kg (251 lb 0.7 oz)   BMI Readings from Last 4 Encounters:  03/29/21 50.32 kg/m  03/19/21 45.16 kg/m  03/12/21 45.33 kg/m  02/19/21 52.15 kg/m   Wt Readings from Last 4 Encounters:  03/29/21 (!) 371 lb (168.3 kg)  03/19/21 (!) 333 lb (151 kg)  03/12/21 (!) 334 lb 3.2 oz (151.6 kg)  02/19/21 (!) 384 lb 8 oz (174.4 kg)    Psych/Mental status: Alert, oriented x 3 (person, place, & time)       Eyes: PERLA Respiratory: No evidence of acute respiratory distress  Assessment & Plan  Primary Diagnosis & Pertinent Problem List: The primary encounter diagnosis was Chronic pain syndrome. Diagnoses of Chronic lower extremity pain (1ry area of Pain) (Bilateral), Chronic low back pain (2ry area of Pain) (Bilateral) w/ sciatica (Bilateral), Chronic shoulder pain (3ry area of Pain) (Left), Primary osteoarthritis of hips (Bilateral), Osteoarthritis of hip (Left), Osteoarthritis of hip (Right), Osteoarthritis of knee (Left), Elevated sed rate, Vitamin D deficiency, Elevated C-reactive protein (CRP), Lumbosacral radiculopathy at L5 (Left), Fall, initial encounter, Pharmacologic therapy, Chronic use of opiate for therapeutic purpose, Encounter for chronic pain management, and Morbid obesity with BMI of 50.0-59.9, adult ([place]) were also pertinent to this visit.  Visit Diagnosis: 1. Chronic pain syndrome   2. Chronic lower extremity pain (1ry area of Pain) (Bilateral)   3. 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 Chronic shoulder pain (3ry area of Pain) (Left)   5. Primary osteoarthritis of hips (Bilateral)   6.  Osteoarthritis of hip (Left)   7. Osteoarthritis of hip (Right)   8. Osteoarthritis of knee (Left)   9. Elevated sed rate   10. Vitamin D deficiency   11. Elevated C-reactive protein (CRP)   12. Lumbosacral radiculopathy at L5 (Left)   13. Fall, initial encounter   14. Pharmacologic therapy   15. Chronic use of opiate for therapeutic purpose   16. Encounter for chronic pain management   17. Morbid obesity with BMI of 50.0-59.9, adult ([place])    Problems updated and reviewed during this visit: Problem  Primary osteoarthritis of hips (Bilateral)  Osteoarthritis of hip (Left)  Osteoarthritis of hip (Right)  Osteoarthritis of knee (Left)  Lumbosacral radiculopathy at L5 (Left)  Elevated Sed Rate  Vitamin D Deficiency  Elevated C-Reactive Protein (Crp)  Fall (03/22/2021)    Plan of Care  Pharmacotherapy (Medications Ordered): Meds ordered this encounter  Medications   ergocalciferol (VITAMIN D2) 1.25 MG (50000 UT) capsule    Sig: Take 1 capsule (50,000 Units total) by mouth 2 (two) times a week. X 6 weeks.    Dispense:  12 capsule    Refill:  0    Fill one day early if pharmacy is closed on scheduled refill date. May substitute for generic, or similar, if available.   Cholecalciferol (VITAMIN D3) 125 MCG (5000 UT) CAPS    Sig: Take 1 capsule (5,000 Units total) by mouth daily with breakfast. Take along with calcium and magnesium.    Dispense:  30 capsule    Refill:  2    Fill 1 day early if pharmacy is closed on scheduled refill date. Generic permitted. Do not send renewal requests.   ketorolac (TORADOL) injection 60 mg   orphenadrine (NORFLEX) injection 60 mg   Oxycodone HCl 10 MG TABS    Sig: Take 1 tablet (10 mg total) by mouth in the morning and at bedtime. Must last 30 days.    Dispense:  60 tablet    Refill:  0    DO NOT: delete (not duplicate); no partial-fill (will  deny script to complete), no refill request (F/U required). DISPENSE: 1 day early if closed on fill date.  WARN: No CNS-depressants within 8 hrs of med.   Procedure Orders    No procedure(s) ordered today   Lab Orders  No laboratory test(s) ordered today   Imaging Orders         DG Lumbar Spine Complete W/Bend         DG Shoulder Left         MR HIP RIGHT WO CONTRAST         MR HIP LEFT WO CONTRAST     Referral Orders         Ambulatory referral to Orthopedic Surgery      Pharmacological management options:  Opioid Analgesics: I will not be prescribing any opioids at this time Membrane stabilizer: I will not be prescribing any at this time Muscle relaxant: I will not be prescribing any at this time NSAID: I will not be prescribing any at this time Other analgesic(s): I will not be prescribing any at this time      Interventional Therapies  Risk   Complexity Considerations:   Estimated body mass index is 50.97 kg/m as calculated from the following:   Height as of this encounter: 6' (1.829 m).   Weight as of this encounter: 375 lb 12.8 oz (170.5 kg). WNL   Planned   Pending:   Toradol/Norflex IM 60/60 mg (03/29/2021)    Under consideration:   Diagnostic bilateral lower extremity nerve conduction testing. Lumbar spine MRI. X-rays of the lumbar spine on flexion and extension. X-rays of the left shoulder. Referral to weight management/bariatric surgery. Will eventually need referral to orthopedic surgery.   Completed:   None at this time   Therapeutic   Palliative (PRN) options:   None established    Provider-requested follow-up: Return in about 30 days (around 04/28/2021) for Eval-day (M,W), (F2F), (MM), for review of ordered tests (NCT,MRI,X-Rays,Consults). Recent Visits Date Type Provider Dept  02/15/21 Office Visit Milinda Pointer, MD [facility]  Showing recent visits within past 90 days and meeting all other requirements Today's Visits Date Type Provider Dept  03/29/21 Office Visit Milinda Pointer, MD [facility]  Showing today's visits  and meeting all other requirements Future Appointments No visits were found meeting these conditions. Showing future appointments within next 90 days and meeting all other requirements  Primary Care Physician: James Hauser, DO Note by: Gaspar Cola, MD Date: 03/29/2021; Time: 2:06 PM

## 2021-03-29 ENCOUNTER — Encounter: Payer: Self-pay | Admitting: Pain Medicine

## 2021-03-29 ENCOUNTER — Other Ambulatory Visit: Payer: Self-pay

## 2021-03-29 ENCOUNTER — Ambulatory Visit: Payer: Medicare Other | Attending: Pain Medicine | Admitting: Pain Medicine

## 2021-03-29 VITALS — BP 126/75 | HR 92 | Temp 98.0°F | Resp 16 | Ht 72.0 in | Wt 371.0 lb

## 2021-03-29 DIAGNOSIS — M79605 Pain in left leg: Secondary | ICD-10-CM | POA: Diagnosis not present

## 2021-03-29 DIAGNOSIS — G894 Chronic pain syndrome: Secondary | ICD-10-CM | POA: Diagnosis not present

## 2021-03-29 DIAGNOSIS — Z79899 Other long term (current) drug therapy: Secondary | ICD-10-CM | POA: Diagnosis not present

## 2021-03-29 DIAGNOSIS — M1712 Unilateral primary osteoarthritis, left knee: Secondary | ICD-10-CM | POA: Diagnosis not present

## 2021-03-29 DIAGNOSIS — M25512 Pain in left shoulder: Secondary | ICD-10-CM | POA: Diagnosis not present

## 2021-03-29 DIAGNOSIS — E559 Vitamin D deficiency, unspecified: Secondary | ICD-10-CM | POA: Diagnosis not present

## 2021-03-29 DIAGNOSIS — M1611 Unilateral primary osteoarthritis, right hip: Secondary | ICD-10-CM | POA: Insufficient documentation

## 2021-03-29 DIAGNOSIS — M5136 Other intervertebral disc degeneration, lumbar region: Secondary | ICD-10-CM | POA: Insufficient documentation

## 2021-03-29 DIAGNOSIS — M5442 Lumbago with sciatica, left side: Secondary | ICD-10-CM | POA: Insufficient documentation

## 2021-03-29 DIAGNOSIS — M5441 Lumbago with sciatica, right side: Secondary | ICD-10-CM | POA: Insufficient documentation

## 2021-03-29 DIAGNOSIS — M79604 Pain in right leg: Secondary | ICD-10-CM | POA: Diagnosis not present

## 2021-03-29 DIAGNOSIS — Z79891 Long term (current) use of opiate analgesic: Secondary | ICD-10-CM | POA: Diagnosis not present

## 2021-03-29 DIAGNOSIS — W19XXXA Unspecified fall, initial encounter: Secondary | ICD-10-CM | POA: Diagnosis not present

## 2021-03-29 DIAGNOSIS — M160 Bilateral primary osteoarthritis of hip: Secondary | ICD-10-CM | POA: Diagnosis not present

## 2021-03-29 DIAGNOSIS — E119 Type 2 diabetes mellitus without complications: Secondary | ICD-10-CM | POA: Diagnosis not present

## 2021-03-29 DIAGNOSIS — M5417 Radiculopathy, lumbosacral region: Secondary | ICD-10-CM | POA: Insufficient documentation

## 2021-03-29 DIAGNOSIS — R296 Repeated falls: Secondary | ICD-10-CM | POA: Diagnosis not present

## 2021-03-29 DIAGNOSIS — M1612 Unilateral primary osteoarthritis, left hip: Secondary | ICD-10-CM

## 2021-03-29 DIAGNOSIS — Z6841 Body Mass Index (BMI) 40.0 and over, adult: Secondary | ICD-10-CM | POA: Insufficient documentation

## 2021-03-29 DIAGNOSIS — R7982 Elevated C-reactive protein (CRP): Secondary | ICD-10-CM | POA: Diagnosis not present

## 2021-03-29 DIAGNOSIS — R700 Elevated erythrocyte sedimentation rate: Secondary | ICD-10-CM | POA: Insufficient documentation

## 2021-03-29 DIAGNOSIS — R531 Weakness: Secondary | ICD-10-CM | POA: Diagnosis not present

## 2021-03-29 DIAGNOSIS — G8929 Other chronic pain: Secondary | ICD-10-CM | POA: Insufficient documentation

## 2021-03-29 MED ORDER — ERGOCALCIFEROL 1.25 MG (50000 UT) PO CAPS: 50000.0000 [IU] | ORAL_CAPSULE | ORAL | 0 refills | Status: AC

## 2021-03-29 MED ORDER — VITAMIN D3 125 MCG (5000 UT) PO CAPS: 1.0000 | ORAL_CAPSULE | Freq: Every day | ORAL | 2 refills | Status: AC

## 2021-03-29 MED ORDER — KETOROLAC TROMETHAMINE 60 MG/2ML IM SOLN
INTRAMUSCULAR | Status: AC
  Filled 2021-03-29: qty 2

## 2021-03-29 MED ORDER — ORPHENADRINE CITRATE 30 MG/ML IJ SOLN
INTRAMUSCULAR | Status: AC
  Filled 2021-03-29: qty 2

## 2021-03-29 MED ORDER — OXYCODONE HCL 10 MG PO TABS: 10.0000 mg | ORAL_TABLET | Freq: Two times a day (BID) | ORAL | 0 refills | Status: DC

## 2021-03-29 MED ORDER — ORPHENADRINE CITRATE 30 MG/ML IJ SOLN
60.0000 mg | Freq: Once | INTRAMUSCULAR | Status: AC
  Administered 2021-03-29: 60 mg via INTRAMUSCULAR

## 2021-03-29 MED ORDER — KETOROLAC TROMETHAMINE 60 MG/2ML IM SOLN
60.0000 mg | Freq: Once | INTRAMUSCULAR | Status: AC
  Administered 2021-03-29: 60 mg via INTRAMUSCULAR

## 2021-03-29 NOTE — Progress Notes (Signed)
Safety precautions to be maintained throughout the outpatient stay will include: orient to surroundings, keep bed in low position, maintain call bell within reach at all times, provide assistance with transfer out of bed and ambulation.  

## 2021-03-29 NOTE — Patient Instructions (Signed)
____________________________________________________________________________________________ ° °Medication Rules ° °Purpose: To inform patients, and their family members, of our rules and regulations. ° °Applies to: All patients receiving prescriptions (written or electronic). ° °Pharmacy of record: Pharmacy where electronic prescriptions will be sent. If written prescriptions are taken to a different pharmacy, please inform the nursing staff. The pharmacy listed in the electronic medical record should be the one where you would like electronic prescriptions to be sent. ° °Electronic prescriptions: In compliance with the [place] [employer]reng[employer] Opioid Misuse Prevention (STOP) Act of 2017 (Session Law 2017-74/H243), effective January 17, 2018, all controlled substances must be electronically prescribed. Calling prescriptions to the pharmacy will cease to exist. ° °Prescription refills: Only during scheduled appointments. Applies to all prescriptions. ° °NOTE: The following applies primarily to controlled substances (Opioid* Pain Medications).  ° °Type of encounter (visit): For patients receiving controlled substances, face-to-face visits are required. (Not an option or up to the patient.) ° °Patient's responsibilities: °Pain Pills: Bring all pain pills to every appointment (except for procedure appointments). °Pill Bottles: Bring pills in original pharmacy bottle. Always bring the newest bottle. Bring bottle, even if empty. °Medication refills: You are responsible for knowing and keeping track of what medications you take and those you need refilled. °The day before your appointment: write a list of all prescriptions that need to be refilled. °The day of the appointment: give the list to the admitting nurse. Prescriptions will be written only during appointments. No prescriptions will be written on procedure days. °If you forget a medication: it will not be "Called in", "Faxed", or "electronically sent". You will  need to get another appointment to get these prescribed. °No early refills. Do not call asking to have your prescription filled early. °Prescription Accuracy: You are responsible for carefully inspecting your prescriptions before leaving our office. Have the discharge nurse carefully go over each prescription with you, before taking them home. Make sure that your name is accurately spelled, that your address is correct. Check the name and dose of your medication to make sure it is accurate. Check the number of pills, and the written instructions to make sure they are clear and accurate. Make sure that you are given enough medication to last until your next medication refill appointment. °Taking Medication: Take medication as prescribed. When it comes to controlled substances, taking less pills or less frequently than prescribed is permitted and encouraged. °Never take more pills than instructed. °Never take medication more frequently than prescribed.  °Inform other Doctors: Always inform, all of your healthcare providers, of all the medications you take. °Pain Medication from other Providers: You are not allowed to accept any additional pain medication from any other Doctor or Healthcare provider. There are two exceptions to this rule. (see below) In the event that you require additional pain medication, you are responsible for notifying us, as stated below. °Cough Medicine: Often these contain an opioid, such as codeine or hydrocodone. Never accept or take cough medicine containing these opioids if you are already taking an opioid* medication. The combination may cause respiratory failure and death. °Medication Agreement: You are responsible for carefully reading and following our Medication Agreement. This must be signed before receiving any prescriptions from our practice. Safely store a copy of your signed Agreement. Violations to the Agreement will result in no further prescriptions. (Additional copies of our  Medication Agreement are available upon request.) °Laws, Rules, & Regulations: All patients are expected to follow all Federal and State Laws, Statutes, Rules, & Regulations. Ignorance of   the Laws does not constitute a valid excuse.  °Illegal drugs and Controlled Substances: The use of illegal substances (including, but not limited to marijuana and its derivatives) and/or the illegal use of any controlled substances is strictly prohibited. Violation of this rule may result in the immediate and permanent discontinuation of any and all prescriptions being written by our practice. The use of any illegal substances is prohibited. °Adopted CDC guidelines & recommendations: Target dosing levels will be at or below 60 MME/day. Use of benzodiazepines** is not recommended. ° °Exceptions: There are only two exceptions to the rule of not receiving pain medications from other Healthcare Providers. °Exception #1 (Emergencies): In the event of an emergency (i.e.: accident requiring emergency care), you are allowed to receive additional pain medication. However, you are responsible for: As soon as you are able, call our office [phone], at any time of the day or night, and leave a message stating your name, the date and nature of the emergency, and the name and dose of the medication prescribed. In the event that your call is answered by a member of our staff, make sure to document and save the date, time, and the name of the person that took your information.  °Exception #2 (Planned Surgery): In the event that you are scheduled by another doctor or dentist to have any type of surgery or procedure, you are allowed (for a period no longer than 30 days), to receive additional pain medication, for the acute post-op pain. However, in this case, you are responsible for picking up a copy of our "Post-op Pain Management for Surgeons" handout, and giving it to your surgeon or dentist. This document is available at our office, and  does not require an appointment to obtain it. Simply go to our office during business hours (Monday-Thursday from 8:00 AM to 4:00 PM) (Friday 8:00 AM to 12:00 Noon) or if you have a scheduled appointment with us, prior to your surgery, and ask for it by name. In addition, you are responsible for: calling our office [phone], at any time of the day or night, and leaving a message stating your name, name of your surgeon, type of surgery, and date of procedure or surgery. Failure to comply with your responsibilities may result in termination of therapy involving the controlled substances. °Medication Agreement Violation. Following the above rules, including your responsibilities will help you in avoiding a Medication Agreement Violation (“Breaking your Pain Medication Contract”). ° °*Opioid medications include: morphine, codeine, oxycodone, oxymorphone, hydrocodone, hydromorphone, meperidine, tramadol, tapentadol, buprenorphine, fentanyl, methadone. °**Benzodiazepine medications include: diazepam (Valium), alprazolam (Xanax), clonazepam (Klonopine), lorazepam (Ativan), clorazepate (Tranxene), chlordiazepoxide (Librium), estazolam (Prosom), oxazepam (Serax), temazepam (Restoril), triazolam (Halcion) °(Last updated: 10/14/2020) °____________________________________________________________________________________________ ° ____________________________________________________________________________________________ ° °Medication Recommendations and Reminders ° °Applies to: All patients receiving prescriptions (written and/or electronic). ° °Medication Rules & Regulations: These rules and regulations exist for your safety and that of others. They are not flexible and neither are we. Dismissing or ignoring them will be considered "non-compliance" with medication therapy, resulting in complete and irreversible termination of such therapy. (See document titled "Medication Rules" for more details.) In all conscience,  because of safety reasons, we cannot continue providing a therapy where the patient does not follow instructions. ° °Pharmacy of record:  °Definition: This is the pharmacy where your electronic prescriptions will be sent.  °We do not endorse any particular pharmacy, however, we have experienced problems with [facility] not securing enough medication supply for the community. °We do not restrict you   in your choice of pharmacy. However, once we write for your prescriptions, we will NOT be re-sending more prescriptions to fix restricted supply problems created by your pharmacy, or your insurance.  °The pharmacy listed in the electronic medical record should be the one where you want electronic prescriptions to be sent. °If you choose to change pharmacy, simply notify our nursing staff. ° °Recommendations: °Keep all of your pain medications in a safe place, under lock and key, even if you live alone. We will NOT replace lost, stolen, or damaged medication. °After you fill your prescription, take 1 week's worth of pills and put them away in a safe place. You should keep a separate, properly labeled bottle for this purpose. The remainder should be kept in the original bottle. Use this as your primary supply, until it runs out. Once it's gone, then you know that you have 1 week's worth of medicine, and it is time to come in for a prescription refill. If you do this correctly, it is unlikely that you will ever run out of medicine. °To make sure that the above recommendation works, it is very important that you make sure your medication refill appointments are scheduled at least 1 week before you run out of medicine. To do this in an effective manner, make sure that you do not leave the office without scheduling your next medication management appointment. Always ask the nursing staff to show you in your prescription , when your medication will be running out. Then arrange for the receptionist to get you a return appointment,  at least 7 days before you run out of medicine. Do not wait until you have 1 or 2 pills left, to come in. This is very poor planning and does not take into consideration that we may need to cancel appointments due to bad weather, sickness, or emergencies affecting our staff. °DO NOT ACCEPT A "Partial Fill": If for any reason your pharmacy does not have enough pills/tablets to completely fill or refill your prescription, do not allow for a "partial fill". The law allows the pharmacy to complete that prescription within 72 hours, without requiring a new prescription. If they do not fill the rest of your prescription within those 72 hours, you will need a separate prescription to fill the remaining amount, which we will NOT provide. If the reason for the partial fill is your insurance, you will need to talk to the pharmacist about payment alternatives for the remaining tablets, but again, DO NOT ACCEPT A PARTIAL FILL, unless you can trust your pharmacist to obtain the remainder of the pills within 72 hours. ° °Prescription refills and/or changes in medication(s):  °Prescription refills, and/or changes in dose or medication, will be conducted only during scheduled medication management appointments. (Applies to both, written and electronic prescriptions.) °No refills on procedure days. No medication will be changed or started on procedure days. No changes, adjustments, and/or refills will be conducted on a procedure day. Doing so will interfere with the diagnostic portion of the procedure. °No phone refills. No medications will be "called into the pharmacy". °No Fax refills. °No weekend refills. °No Holliday refills. °No after hours refills. ° °Remember:  °Business hours are:  °Monday to Thursday 8:00 AM to 4:00 PM °Provider's Schedule: °Shelina Luo, MD - Appointments are:  °Medication management: Monday and Wednesday 8:00 AM to 4:00 PM °Procedure day: Tuesday and Thursday 7:30 AM to 4:00 PM °Bilal Lateef, MD -  Appointments are:  °Medication management: Tuesday and Thursday 8:00   AM to 4:00 PM °Procedure day: Monday and Wednesday 7:30 AM to 4:00 PM °(Last update: 08/07/2019) °____________________________________________________________________________________________ ° ____________________________________________________________________________________________ ° °CBD (cannabidiol) & Delta-8 (Delta-8 tetrahydrocannabinol) WARNING ° °Intro: Cannabidiol (CBD) and tetrahydrocannabinol (THC), are two natural compounds found in plants of the Cannabis genus. They can both be extracted from hemp or cannabis. Hemp and cannabis come from the Cannabis sativa plant. Both compounds interact with your body’s endocannabinoid system, but they have very different effects. CBD does not produce the high sensation associated with cannabis. Delta-8 tetrahydrocannabinol, also known as delta-8 THC, is a psychoactive substance found in the Cannabis sativa plant, of which marijuana and hemp are two varieties. THC is responsible for the high associated with the illicit use of marijuana. ° °Applicable to: All individuals currently taking or considering taking CBD (cannabidiol) and, more important, all patients taking opioid analgesic controlled substances (pain medication). (Example: oxycodone; oxymorphone; hydrocodone; hydromorphone; morphine; methadone; tramadol; tapentadol; fentanyl; buprenorphine; butorphanol; dextromethorphan; meperidine; codeine; etc.) ° °Legal status: CBD remains a Schedule I drug prohibited for any use. CBD is illegal with one exception. In the [place], CBD has a limited Food and Drug Administration (FDA) approval for the treatment of two specific types of epilepsy disorders. Only one CBD product has been approved by the FDA for this purpose: "Epidiolex". FDA is aware that some companies are marketing products containing cannabis and cannabis-derived compounds in ways that violate the Federal Food, Drug and Cosmetic Act  (FD&C Act) and that may put the health and safety of consumers at risk. The FDA, a Federal agency, has not enforced the CBD status since 2018. UPDATE: (03/05/2021) The Drug Enforcement Agency (DEA) issued a letter stating that "delta" cannabinoids, including Delta-8-THCO and Delta-9-THCO, synthetically derived from hemp do not qualify as hemp and will be viewed as Schedule I drugs. (Schedule I drugs, substances, or chemicals are defined as drugs with no currently accepted medical use and a high potential for abuse. Some examples of Schedule I drugs are: heroin, lysergic acid diethylamide (LSD), marijuana (cannabis), 3,4-methylenedioxymethamphetamine (ecstasy), methaqualone, and peyote.) (https://www.dea.gov) ° °Legality: Some manufacturers ship CBD products nationally, which is illegal. Often such products are sold online and are therefore available throughout the country. CBD is openly sold in head shops and health food stores in some states where such sales have not been explicitly legalized. Selling unapproved products with unsubstantiated therapeutic claims is not only a violation of the law, but also can put patients at risk, as these products have not been proven to be safe or effective. Federal illegality makes it difficult to conduct research on CBD. ° °Reference: "FDA Regulation of Cannabis and Cannabis-Derived Products, Including Cannabidiol (CBD)" - https://www.fda.gov/news-events/public-health-focus/fda-regulation-cannabis-and-cannabis-derived-products-including-cannabidiol-cbd ° °Warning: CBD is not FDA approved and has not undergo the same manufacturing controls as prescription drugs.  This means that the purity and safety of available CBD may be questionable. Most of the time, despite manufacturer's claims, it is contaminated with THC (delta-9-tetrahydrocannabinol - the chemical in marijuana responsible for the "HIGH").  When this is the case, the THC contaminant will trigger a positive urine drug  screen (UDS) test for Marijuana (carboxy-THC). Because a positive UDS for any illicit substance is a violation of our medication agreement, your opioid analgesics (pain medicine) may be permanently discontinued. °The FDA recently put out a warning about 5 things that everyone should be aware of regarding Delta-8 THC: °Delta-8 THC products have not been evaluated or approved by the FDA for safe use and may be marketed in ways that put the   public health at risk. °The FDA has received adverse event reports involving delta-8 THC-containing products. °Delta-8 THC has psychoactive and intoxicating effects. °Delta-8 THC manufacturing often involve use of potentially harmful chemicals to create the concentrations of delta-8 THC claimed in the marketplace. The final delta-8 THC product may have potentially harmful by-products (contaminants) due to the chemicals used in the process. Manufacturing of delta-8 THC products may occur in uncontrolled or unsanitary settings, which may lead to the presence of unsafe contaminants or other potentially harmful substances. °Delta-8 THC products should be kept out of the reach of children and pets. ° °MORE ABOUT CBD ° °General Information: CBD was discovered in 1940 and it is a derivative of the cannabis sativa genus plants (Marijuana and Hemp). It is one of the 113 identified substances found in Marijuana. It accounts for up to 40% of the plant's extract. As of 2018, preliminary clinical studies on CBD included research for the treatment of anxiety, movement disorders, and pain. CBD is available and consumed in multiple forms, including inhalation of smoke or vapor, as an aerosol spray, and by mouth. It may be supplied as an oil containing CBD, capsules, dried cannabis, or as a liquid solution. CBD is thought not to be as psychoactive as THC (delta-9-tetrahydrocannabinol - the chemical in marijuana responsible for the "HIGH"). Studies suggest that CBD may interact with different  biological target receptors in the body, including cannabinoid and other neurotransmitter receptors. As of 2018 the mechanism of action for its biological effects has not been determined. ° °Side-effects   Adverse reactions: Dry mouth, diarrhea, decreased appetite, fatigue, drowsiness, malaise, weakness, sleep disturbances, and others. ° °Drug interactions: CBC may interact with other medications such as blood-thinners. Because CBD causes drowsiness on its own, it also increases the drowsiness caused by other medications, including antihistamines (such as Benadryl), benzodiazepines (Xanax, Ativan, Valium), antipsychotics, antidepressants and opioids, as well as alcohol and supplements such as kava, melatonin and St. John's Wort. Be cautious with the following combinations:  ° °Brivaracetam (Briviact) °Brivaracetam is changed and broken down by the body. CBD might decrease how quickly the body breaks down brivaracetam. This might increase levels of brivaracetam in the body. ° °Caffeine °Caffeine is changed and broken down by the body. CBD might decrease how quickly the body breaks down caffeine. This might increase levels of caffeine in the body. ° °Carbamazepine (Tegretol) °Carbamazepine is changed and broken down by the body. CBD might decrease how quickly the body breaks down carbamazepine. This might increase levels of carbamazepine in the body and increase its side effects. ° °Citalopram (Celexa) °Citalopram is changed and broken down by the body. CBD might decrease how quickly the body breaks down citalopram. This might increase levels of citalopram in the body and increase its side effects. ° °Clobazam (Onfi) °Clobazam is changed and broken down by the liver. CBD might decrease how quickly the liver breaks down clobazam. This might increase the effects and side effects of clobazam. ° °Eslicarbazepine (Aptiom) °Eslicarbazepine is changed and broken down by the body. CBD might decrease how quickly the body  breaks down eslicarbazepine. This might increase levels of eslicarbazepine in the body by a small amount. ° °Everolimus (Zostress) °Everolimus is changed and broken down by the body. CBD might decrease how quickly the body breaks down everolimus. This might increase levels of everolimus in the body. ° °Lithium °Taking higher doses of CBD might increase levels of lithium. This can increase the risk of lithium toxicity. ° °Medications changed by the   liver (Cytochrome P450 1A1 (CYP1A1) substrates) °Some medications are changed and broken down by the liver. CBD might change how quickly the liver breaks down these medications. This could change the effects and side effects of these medications. ° °Medications changed by the liver (Cytochrome P450 1A2 (CYP1A2) substrates) °Some medications are changed and broken down by the liver. CBD might change how quickly the liver breaks down these medications. This could change the effects and side effects of these medications. ° °Medications changed by the liver (Cytochrome P450 1B1 (CYP1B1) substrates) °Some medications are changed and broken down by the liver. CBD might change how quickly the liver breaks down these medications. This could change the effects and side effects of these medications. ° °Medications changed by the liver (Cytochrome P450 2A6 (CYP2A6) substrates) °Some medications are changed and broken down by the liver. CBD might change how quickly the liver breaks down these medications. This could change the effects and side effects of these medications. ° °Medications changed by the liver (Cytochrome P450 2B6 (CYP2B6) substrates) °Some medications are changed and broken down by the liver. CBD might change how quickly the liver breaks down these medications. This could change the effects and side effects of these medications. ° °Medications changed by the liver (Cytochrome P450 2C19 (CYP2C19) substrates) °Some medications are changed and broken down by the liver.  CBD might change how quickly the liver breaks down these medications. This could change the effects and side effects of these medications. ° °Medications changed by the liver (Cytochrome P450 2C8 (CYP2C8) substrates) °Some medications are changed and broken down by the liver. CBD might change how quickly the liver breaks down these medications. This could change the effects and side effects of these medications. ° °Medications changed by the liver (Cytochrome P450 2C9 (CYP2C9) substrates) °Some medications are changed and broken down by the liver. CBD might change how quickly the liver breaks down these medications. This could change the effects and side effects of these medications. ° °Medications changed by the liver (Cytochrome P450 2D6 (CYP2D6) substrates) °Some medications are changed and broken down by the liver. CBD might change how quickly the liver breaks down these medications. This could change the effects and side effects of these medications. ° °Medications changed by the liver (Cytochrome P450 2E1 (CYP2E1) substrates) °Some medications are changed and broken down by the liver. CBD might change how quickly the liver breaks down these medications. This could change the effects and side effects of these medications. ° °Medications changed by the liver (Cytochrome P450 3A4 (CYP3A4) substrates) °Some medications are changed and broken down by the liver. CBD might change how quickly the liver breaks down these medications. This could change the effects and side effects of these medications. ° °Medications changed by the liver (Glucuronidated drugs) °Some medications are changed and broken down by the liver. CBD might change how quickly the liver breaks down these medications. This could change the effects and side effects of these medications. ° °Medications that decrease the breakdown of other medications by the liver (Cytochrome P450 2C19 (CYP2C19) inhibitors) °CBD is changed and broken down by the liver.  Some drugs decrease how quickly the liver changes and breaks down CBD. This could change the effects and side effects of CBD. ° °Medications that decrease the breakdown of other medications in the liver (Cytochrome P450 3A4 (CYP3A4) inhibitors) °CBD is changed and broken down by the liver. Some drugs decrease how quickly the liver changes and breaks down CBD. This could change the   effects and side effects of CBD.  Medications that increase breakdown of other medications by the liver (Cytochrome P450 3A4 (CYP3A4) inducers) CBD is changed and broken down by the liver. Some drugs increase how quickly the liver changes and breaks down CBD. This could change the effects and side effects of CBD.  Medications that increase the breakdown of other medications by the liver (Cytochrome P450 2C19 (CYP2C19) inducers) CBD is changed and broken down by the liver. Some drugs increase how quickly the liver changes and breaks down CBD. This could change the effects and side effects of CBD.  Methadone (Dolophine) Methadone is broken down by the liver. CBD might decrease how quickly the liver breaks down methadone. Taking cannabidiol along with methadone might increase the effects and side effects of methadone.  Rufinamide (Banzel) Rufinamide is changed and broken down by the body. CBD might decrease how quickly the body breaks down rufinamide. This might increase levels of rufinamide in the body by a small amount.  Sedative medications (CNS depressants) CBD might cause sleepiness and slowed breathing. Some medications, called sedatives, can also cause sleepiness and slowed breathing. Taking CBD with sedative medications might cause breathing problems and/or too much sleepiness.  Sirolimus (Rapamune) Sirolimus is changed and broken down by the body. CBD might decrease how quickly the body breaks down sirolimus. This might increase levels of sirolimus in the body.  Stiripentol (Diacomit) Stiripentol is changed and  broken down by the body. CBD might decrease how quickly the body breaks down stiripentol. This might increase levels of stiripentol in the body and increase its side effects.  Tacrolimus (Prograf) Tacrolimus is changed and broken down by the body. CBD might decrease how quickly the body breaks down tacrolimus. This might increase levels of tacrolimus in the body.  Tamoxifen (Soltamox) Tamoxifen is changed and broken down by the body. CBD might affect how quickly the body breaks down tamoxifen. This might affect levels of tamoxifen in the body.  Topiramate (Topamax) Topiramate is changed and broken down by the body. CBD might decrease how quickly the body breaks down topiramate. This might increase levels of topiramate in the body by a small amount.  Valproate Valproic acid can cause liver injury. Taking cannabidiol with valproic acid might increase the chance of liver injury. CBD and/or valproic acid might need to be stopped, or the dose might need to be reduced.  Warfarin (Coumadin) CBD might increase levels of warfarin, which can increase the risk for bleeding. CBD and/or warfarin might need to be stopped, or the dose might need to be reduced.  Zonisamide Zonisamide is changed and broken down by the body. CBD might decrease how quickly the body breaks down zonisamide. This might increase levels of zonisamide in the body by a small amount. (Last update: 03/17/2021) ____________________________________________________________________________________________

## 2021-03-29 NOTE — Progress Notes (Signed)
Patient lives with his brother.  Is trying to get his own place and then he will live alone.  States some days he is unable to make it to the kitchen to cook and has to do without eating.  When asked about Meals on Wheels and those type services, he states he was told he is not eligible for the services.   ? ?Patient had a fall last week, did not go for evaluation. States he hurt his legs but does not feel as if there is a fx.  ?

## 2021-03-31 NOTE — Chronic Care Management (AMB) (Signed)
?  Chronic Care Management  ? ?Outreach Note ? ?03/31/2021 ?Name: James [name] MRN: [id number] DOB: [birth year] ? ?James [name] is a 61 y.o. year old male who is a primary care patient of Olin Hauser, DO. I reached out to [name] by phone today in response to a referral sent by [name]'s primary care provider. ? ?A second unsuccessful telephone outreach was attempted today. The patient was referred to the case management team for assistance with care management and care coordination.  ? ?Follow Up Plan: A HIPAA compliant phone message was left for the patient providing contact information and requesting a return call.  ?The care management team will reach out to the patient again over the next 5 days.  ?If patient returns call to provider office, please advise to call [facility] * at [phone]* ? ?Noreene Larsson, RMA ?Care Guide, Embedded Care Coordination ?[facility]  ?[place], [place] [address] ?Direct Dial: [phone] ?Museum/gallery [email or web address] ?Website: [email or web address]  ? ?

## 2021-04-07 NOTE — Chronic Care Management (AMB) (Signed)
?  Chronic Care Management  ? ?Note ? ?04/07/2021 ?Name: James [name] MRN: [id number] DOB: Oct 30, 1960 ? ?James [name] is a 61 y.o. year old male who is a primary care patient of Olin Hauser, DO. I reached out to James [name] by phone today in response to a referral sent by James [name]. ? ?James [name] was given information about Chronic Care Management services today including:  ?CCM service includes personalized support from designated clinical staff supervised by his physician, including individualized plan of care and coordination with other care providers ?24/7 contact phone numbers for assistance for urgent and routine care needs. ?Service will only be billed when office clinical staff spend 20 minutes or more in a month to coordinate care. ?Only one practitioner may furnish and bill the service in a calendar month. ?The patient may stop CCM services at any time (effective at the end of the month) by phone call to the office staff. ?The patient is responsible for co-pay (up to 20% after annual deductible is met) if co-pay is required by the individual health plan.  ? ?Patient agreed to services and verbal consent obtained.  ? ?Follow up plan: ?Telephone appointment with care management team member scheduled for:04/13/2021 ? ?James [name], James [name] ?Care Guide, Embedded Care Coordination ?[facility]  [facility]  ?[place], [place] [address] ?Direct Dial: [phone] ?Museum/[name] ?Website: [email or web address]  ? ?

## 2021-04-09 ENCOUNTER — Ambulatory Visit: Payer: Medicare Other

## 2021-04-12 ENCOUNTER — Ambulatory Visit: Payer: Medicare Other | Admitting: Podiatry

## 2021-04-13 ENCOUNTER — Telehealth: Payer: Self-pay | Admitting: Licensed Clinical Social Worker

## 2021-04-13 ENCOUNTER — Telehealth: Payer: Medicare Other

## 2021-04-13 NOTE — Chronic Care Management (AMB) (Signed)
? ?   Clinical Social Work  ?Care Management  ? Phone Outreach  ? ? ?04/13/2021 ?Name: [name] MRN: [id number] DOB: 06-19-1960 ? ?[name] is a 61 y.o. year old male who is a primary care patient of Olin Hauser, DO .  ? ?Reason for referral: [employer] for housing Artist for [place]   ? ?Two attempts made, will make one more attempt today. ?CCM LCSW reached out to patient today by phone to introduce self, assess needs and offer Care Management services and interventions.    Telephone outreach was unsuccessful. A HIPPA compliant phone message was left for the patient providing contact information and requesting a return call.  ? ?Plan:CCM LCSW will wait for return call. If no return call is received, Will route chart to Care Guide to see if patient would like to reschedule phone appointment  ? ?Review of patient status, including review of consultants reports, relevant laboratory and other test results, and collaboration with appropriate care team members and the patient's provider was performed as part of comprehensive patient evaluation and provision of care management services.   ? ? ?Casimer Lanius, LCSW ?Licensed Clinical Social Worker [facility]  ?CCM LCSW Coverage for Christa See, LCSW ?[phone]  ? ?  ? ? ?  ?

## 2021-04-14 ENCOUNTER — Ambulatory Visit (INDEPENDENT_AMBULATORY_CARE_PROVIDER_SITE_OTHER): Payer: Medicare Other | Admitting: Licensed Clinical Social Worker

## 2021-04-14 DIAGNOSIS — F3289 Other specified depressive episodes: Secondary | ICD-10-CM

## 2021-04-14 DIAGNOSIS — M5136 Other intervertebral disc degeneration, lumbar region: Secondary | ICD-10-CM | POA: Diagnosis not present

## 2021-04-14 DIAGNOSIS — R296 Repeated falls: Secondary | ICD-10-CM | POA: Diagnosis not present

## 2021-04-14 DIAGNOSIS — M150 Primary generalized (osteo)arthritis: Secondary | ICD-10-CM | POA: Diagnosis not present

## 2021-04-14 DIAGNOSIS — G894 Chronic pain syndrome: Secondary | ICD-10-CM | POA: Diagnosis not present

## 2021-04-14 DIAGNOSIS — Z794 Long term (current) use of insulin: Secondary | ICD-10-CM

## 2021-04-14 NOTE — Patient Instructions (Signed)
? ?  It was a pleasure speaking with you today. ?I am sorry you were unable to keep your phone appointment yesterday.   ?per your request your appointment is scheduled April 11th  ?Please call the office if needed ? ?Casimer Lanius, LCSW ?Licensed Clinical Social Worker [facility]  ?CCM LCSW Coverage for Christa See, LCSW ?[phone]  ?

## 2021-04-14 NOTE — Chronic Care Management (AMB) (Signed)
? ?   Clinical Social Work  ?Care Management  ? Phone Outreach  ? ? ?04/14/2021 ?Name: Prince Couey MRN: [id number] DOB: 03/02/1960 ? ?Jassiel Flye is a 61 y.o. year old male who is a primary care patient of Olin Hauser, DO .  ? ?Incoming call from patient.  His missed phone appointment yesterday due to having problems with his phone. Requested call this number [phone] if unable to reach at other number  ? ?Plan:Appointment was rescheduled with CCM LCSW on April 11th at 9:00 ? ?Review of patient status, including review of consultants reports, relevant laboratory and other test results, and collaboration with appropriate care team members and the patient's provider was performed as part of comprehensive patient evaluation and provision of care management services.   ? ?Casimer Lanius, LCSW ?Licensed Clinical Social Worker [facility]  ?CCM LCSW Coverage for Christa See, LCSW ?[phone]  ? ? ?  ? ? ?  ?

## 2021-04-15 ENCOUNTER — Telehealth: Payer: Self-pay

## 2021-04-15 NOTE — Telephone Encounter (Signed)
Patient left vm wanting to cancel and reschedule colonoscopy. Requesting call back. ? ?I tried to call back to confirm that he wanted to cancel and reschedule. No answer.  ?

## 2021-04-15 NOTE — Telephone Encounter (Signed)
Called patient back his medicine was sent to [facility] [address] like he asked  let patient know to just go pick it up ?

## 2021-04-15 NOTE — Telephone Encounter (Signed)
Called patient back to reschedule no answer left vm for a call back ?

## 2021-04-16 ENCOUNTER — Other Ambulatory Visit: Payer: Self-pay

## 2021-04-16 ENCOUNTER — Telehealth: Payer: Self-pay

## 2021-04-16 ENCOUNTER — Ambulatory Visit: Payer: Medicare Other | Admitting: *Deleted

## 2021-04-16 DIAGNOSIS — Z794 Long term (current) use of insulin: Secondary | ICD-10-CM

## 2021-04-16 DIAGNOSIS — F3289 Other specified depressive episodes: Secondary | ICD-10-CM

## 2021-04-16 DIAGNOSIS — Z1211 Encounter for screening for malignant neoplasm of colon: Secondary | ICD-10-CM

## 2021-04-16 DIAGNOSIS — E1169 Type 2 diabetes mellitus with other specified complication: Secondary | ICD-10-CM

## 2021-04-16 NOTE — Progress Notes (Signed)
Patient called back I sent new refferal new letters and put in new orders ?

## 2021-04-16 NOTE — Telephone Encounter (Signed)
CALLED PATIENT NO ANSWER LEFT VOICEMAIL FOR A CALL BACK ?Called and canceled his procedure waiting for a call back to reschedule ?

## 2021-04-19 ENCOUNTER — Ambulatory Visit: Admission: RE | Admit: 2021-04-19 | Payer: Medicare Other | Source: Home / Self Care | Admitting: Gastroenterology

## 2021-04-19 ENCOUNTER — Other Ambulatory Visit: Payer: Self-pay

## 2021-04-19 ENCOUNTER — Encounter: Admission: RE | Payer: Self-pay | Source: Home / Self Care

## 2021-04-19 ENCOUNTER — Telehealth: Payer: Self-pay

## 2021-04-19 SURGERY — COLONOSCOPY WITH PROPOFOL
Anesthesia: General

## 2021-04-19 MED ORDER — NA SULFATE-K SULFATE-MG SULF 17.5-3.13-1.6 GM/177ML PO SOLN: 1.0000 | Freq: Once | ORAL | 0 refills | Status: AC

## 2021-04-19 NOTE — Telephone Encounter (Signed)
CALLED PATIENT NO ANSWER LEFT VOICEMAIL FOR A CALL BACK ? ?

## 2021-04-20 ENCOUNTER — Ambulatory Visit
Admission: RE | Admit: 2021-04-20 | Discharge: 2021-04-20 | Disposition: A | Discharge status: Home / Self Care | Payer: Medicare Other | Source: Ambulatory Visit | Attending: Pain Medicine | Admitting: Pain Medicine

## 2021-04-20 DIAGNOSIS — M160 Bilateral primary osteoarthritis of hip: Secondary | ICD-10-CM | POA: Insufficient documentation

## 2021-04-20 DIAGNOSIS — M1612 Unilateral primary osteoarthritis, left hip: Secondary | ICD-10-CM | POA: Insufficient documentation

## 2021-04-20 DIAGNOSIS — M1611 Unilateral primary osteoarthritis, right hip: Secondary | ICD-10-CM | POA: Insufficient documentation

## 2021-04-20 DIAGNOSIS — M5136 Other intervertebral disc degeneration, lumbar region: Secondary | ICD-10-CM | POA: Diagnosis not present

## 2021-04-20 DIAGNOSIS — M19012 Primary osteoarthritis, left shoulder: Secondary | ICD-10-CM | POA: Diagnosis not present

## 2021-04-20 DIAGNOSIS — M25512 Pain in left shoulder: Secondary | ICD-10-CM | POA: Insufficient documentation

## 2021-04-20 DIAGNOSIS — M4606 Spinal enthesopathy, lumbar region: Secondary | ICD-10-CM | POA: Diagnosis not present

## 2021-04-20 DIAGNOSIS — M79605 Pain in left leg: Secondary | ICD-10-CM | POA: Diagnosis not present

## 2021-04-20 DIAGNOSIS — M25551 Pain in right hip: Secondary | ICD-10-CM | POA: Diagnosis not present

## 2021-04-20 DIAGNOSIS — M85412 Solitary bone cyst, left shoulder: Secondary | ICD-10-CM | POA: Diagnosis not present

## 2021-04-20 DIAGNOSIS — M5442 Lumbago with sciatica, left side: Secondary | ICD-10-CM | POA: Insufficient documentation

## 2021-04-20 DIAGNOSIS — M25712 Osteophyte, left shoulder: Secondary | ICD-10-CM | POA: Diagnosis not present

## 2021-04-20 DIAGNOSIS — G8929 Other chronic pain: Secondary | ICD-10-CM | POA: Insufficient documentation

## 2021-04-20 DIAGNOSIS — M79604 Pain in right leg: Secondary | ICD-10-CM | POA: Insufficient documentation

## 2021-04-20 DIAGNOSIS — M5441 Lumbago with sciatica, right side: Secondary | ICD-10-CM | POA: Diagnosis not present

## 2021-04-20 DIAGNOSIS — M25552 Pain in left hip: Secondary | ICD-10-CM | POA: Diagnosis not present

## 2021-04-20 IMAGING — CR DG LUMBAR SPINE COMPLETE W/ BEND
7 series · 7 of 7 positions shown · non-contrast
Comparison: None.

CLINICAL DATA: Chronic bilateral low back pain with bilateral
sciatica.

EXAM:
LUMBAR SPINE - COMPLETE WITH BENDING VIEWS

[l-spine ap]
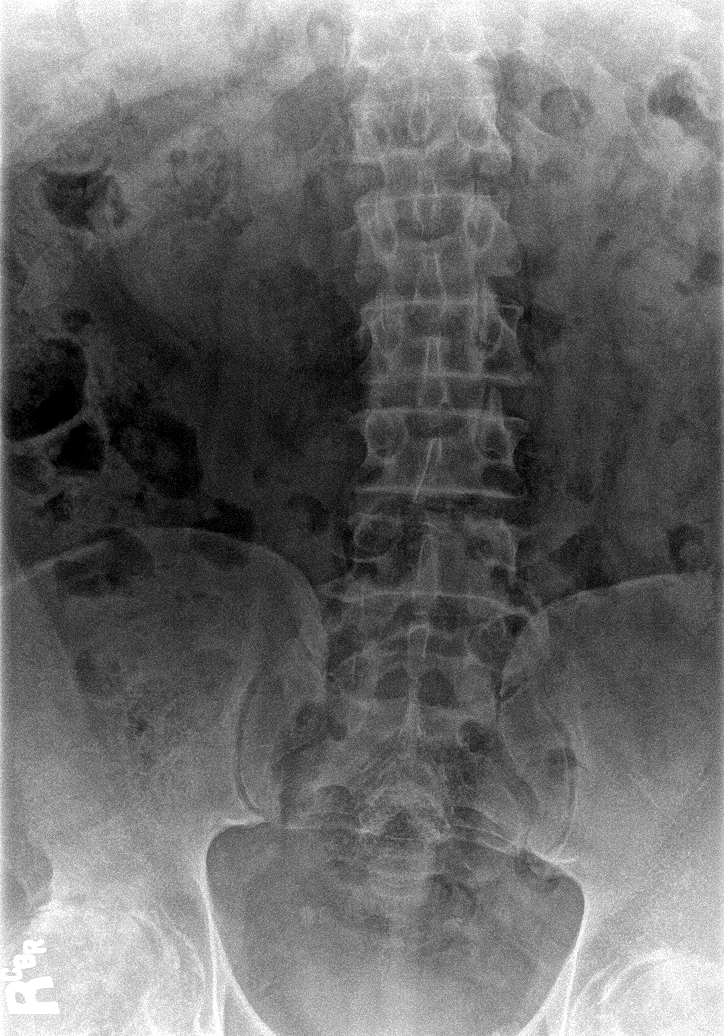

[l-spine obl (1 of 2)]
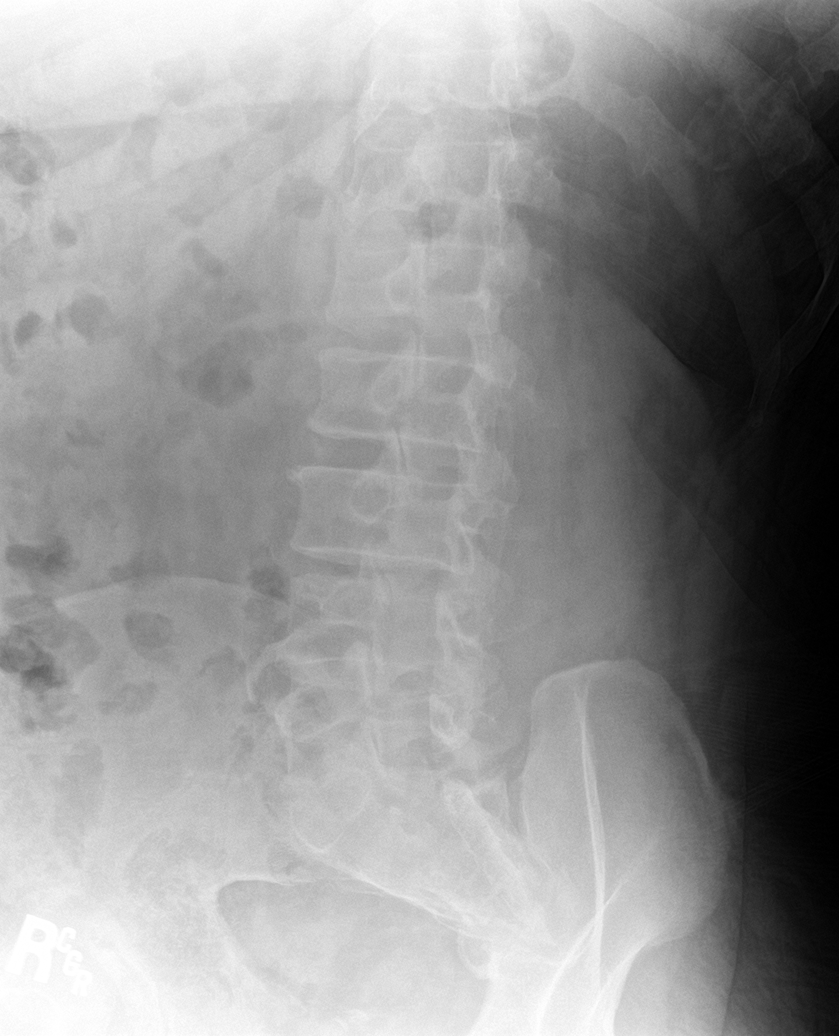

[l-spine obl (2 of 2)]
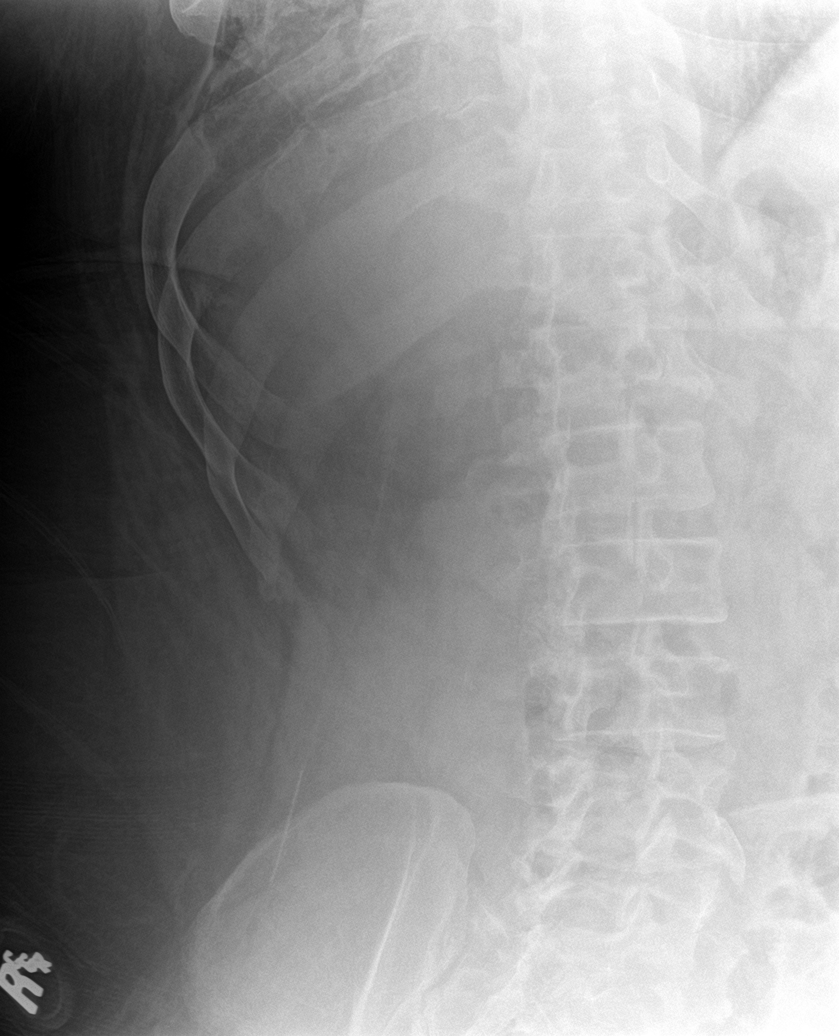

[l-spine lat]
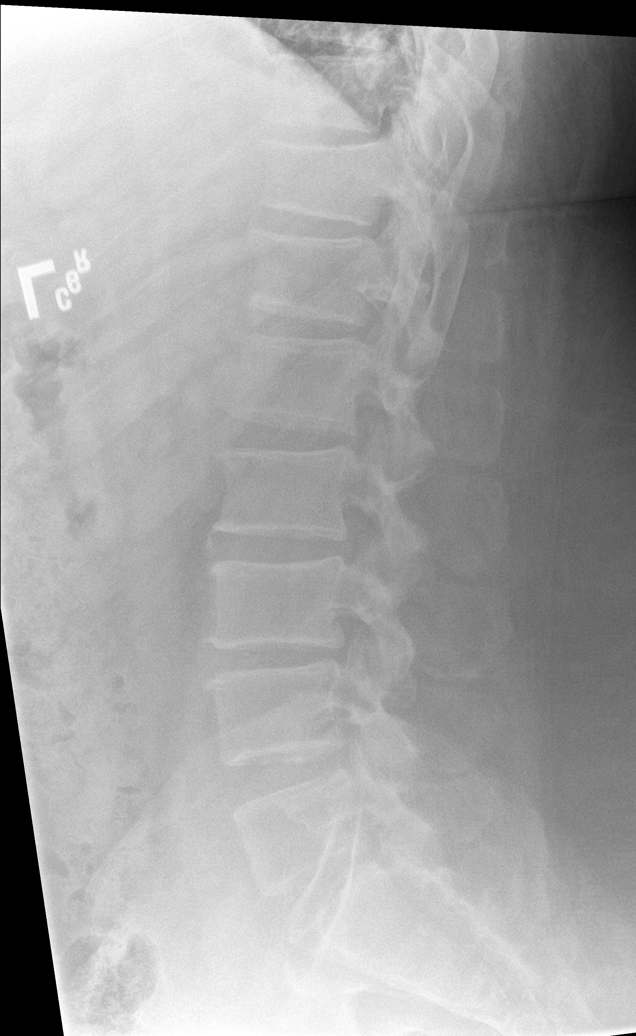

[l-spine flex]
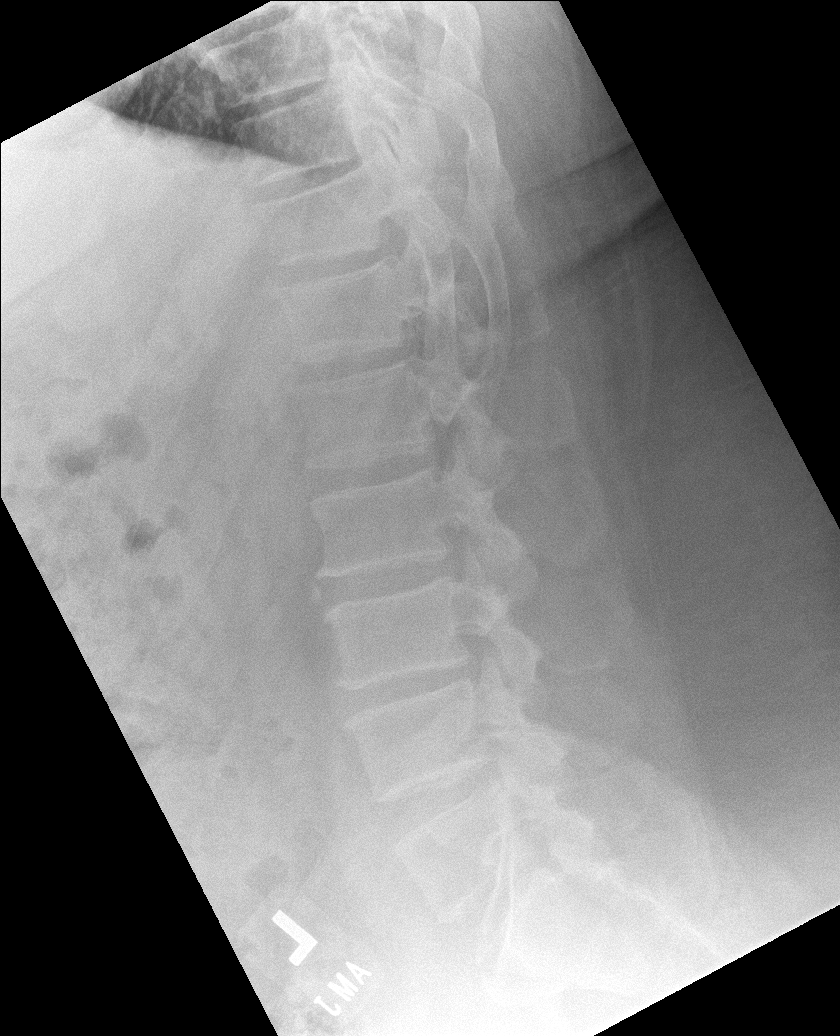

[l-spine ext]
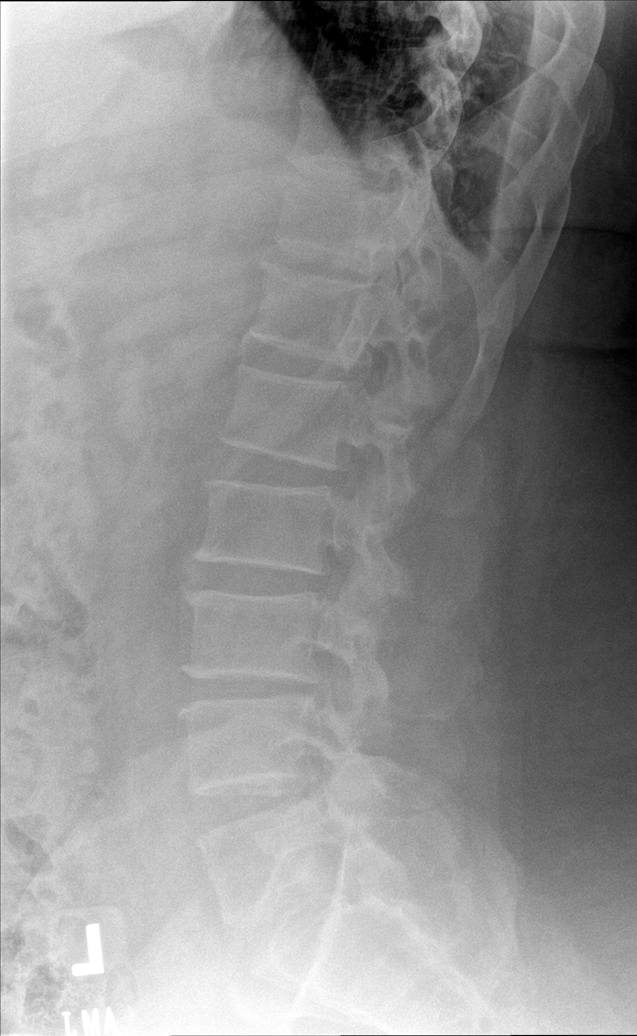

[l-spine spot]
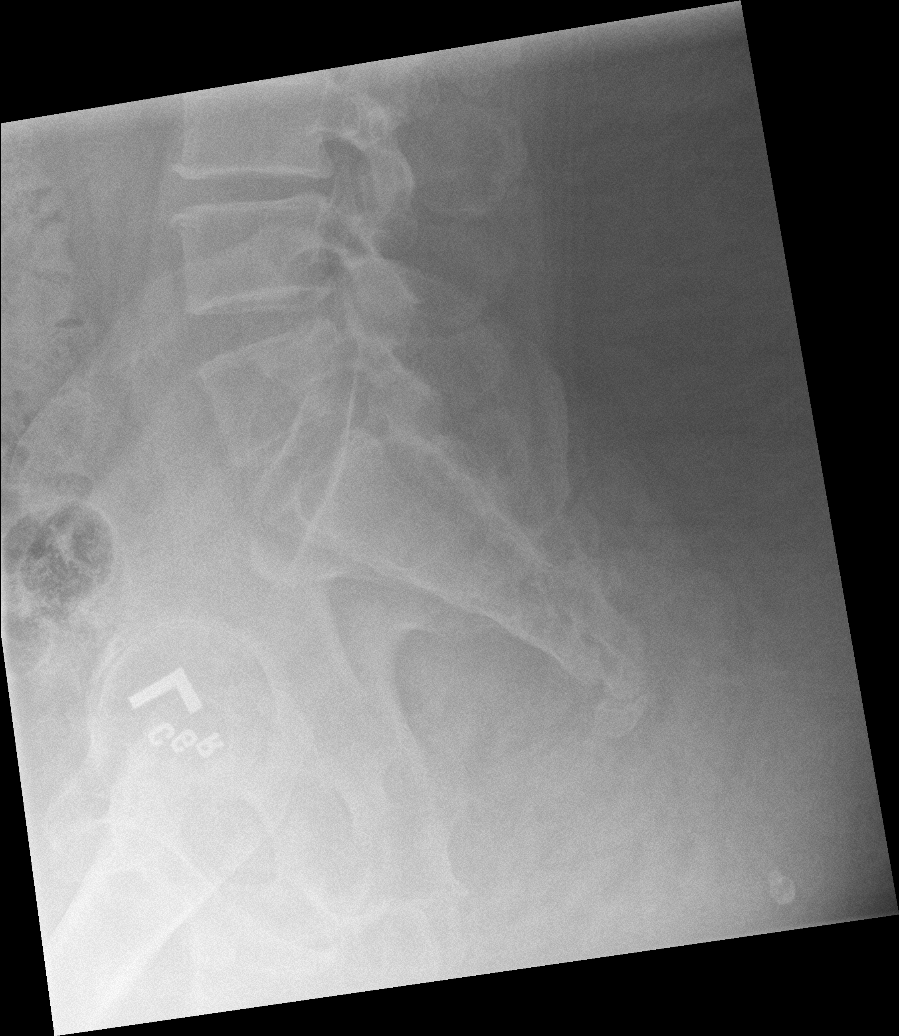

[7 of 7 positions shown; findings below may reference images not displayed]

FINDINGS: Technically limited due to habitus. Suspected small ribs at T12 and
5 non-rib-bearing lumbar vertebra. Normal alignment. No listhesis.
Limited range of motion but no instability on flexion or extension.
Mild disc space narrowing and endplate spurring at L3-L4. Remaining
disc spaces are preserved. Mild L4-L5 and L5-S1 facet hypertrophy.
No evidence of fracture, focal bone lesion, pars defects or bone
destruction. Sacroiliac joints are congruent.
IMPRESSION: 1. Mild degenerative disc disease at L3-L4
2. L4-L5 and L5-S1 facet hypertrophy.
3. Limited range of motion but no instability on flexion or
extension.

## 2021-04-20 IMAGING — MR MR HIP*R* W/O CM
6 series · 40 of 40 positions shown · non-contrast
Comparison: None.

CLINICAL DATA: Bilateral hip pain.

EXAM:
MR OF THE LEFT AND RIGHT HIP WITHOUT CONTRAST
TECHNIQUE: Multiplanar, multisequence MR imaging was performed. No intravenous
contrast was administered.

[Series 4: T1 · coronal · B · 4.0mm · 1.19mm/px · 8 of 40 slices shown]
[im 1/40]
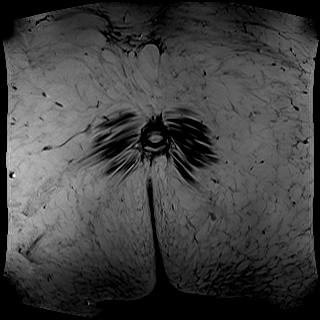
[im 6/40]
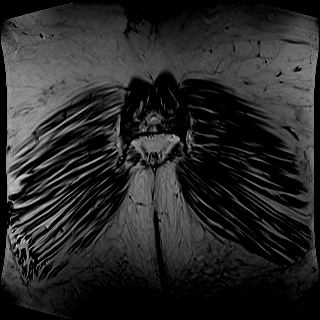
[im 12/40]
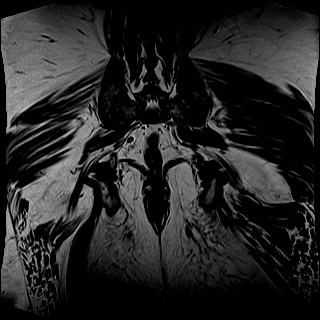
[im 17/40]
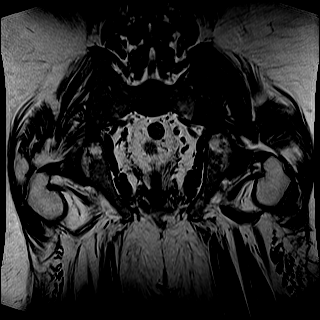
[im 23/40]
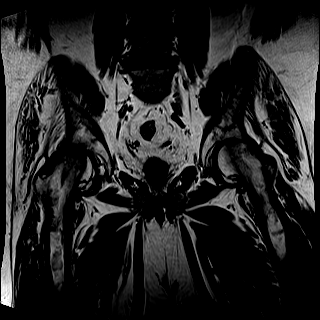
[im 28/40]
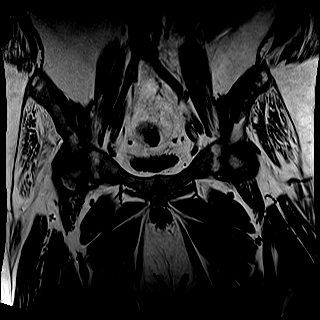
[im 34/40]
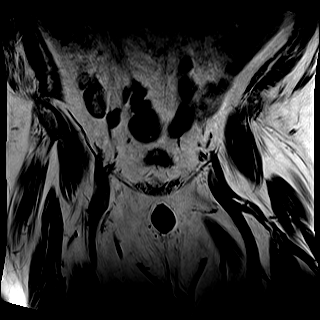
[im 40/40]
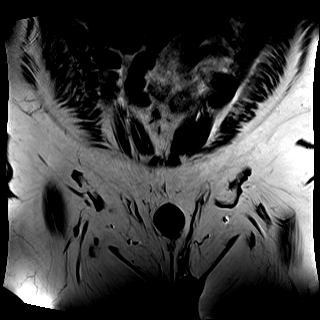

[Series 5: STIR · coronal · B · 4.0mm · 1.19mm/px · 8 of 40 slices shown]
[im 1/40]
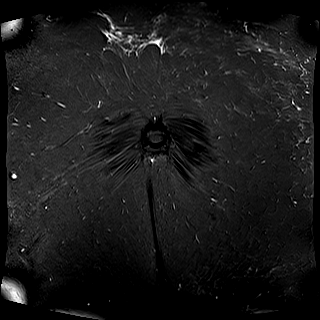
[im 6/40]
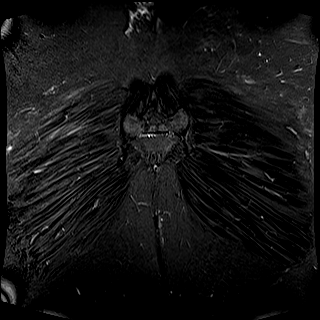
[im 12/40]
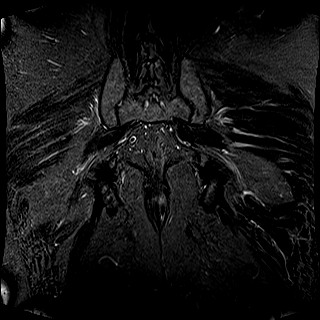
[im 17/40]
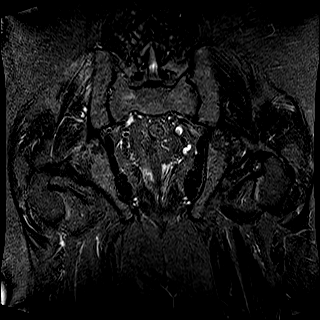
[im 23/40]
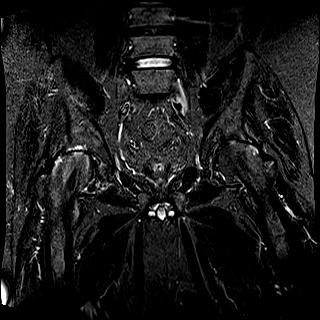
[im 28/40]
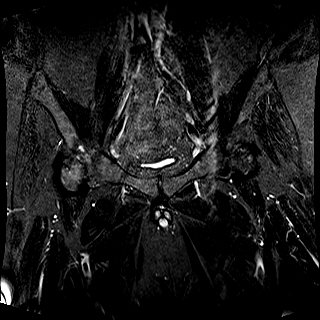
[im 34/40]
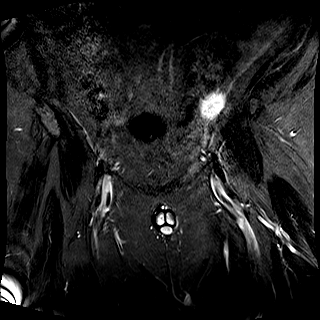
[im 40/40]
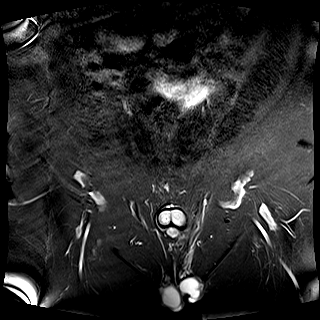

[Series 6: T2 fat-sat · axial · B · 4.0mm · 0.70mm/px · z∈[-74,+71]mm · 6 of 30 slices shown]
[im 1/30]
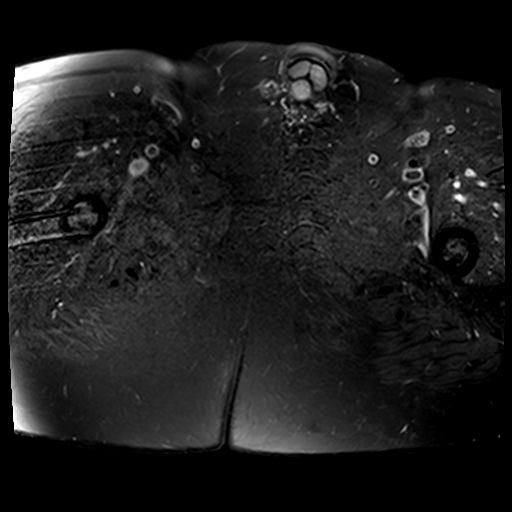
[im 6/30]
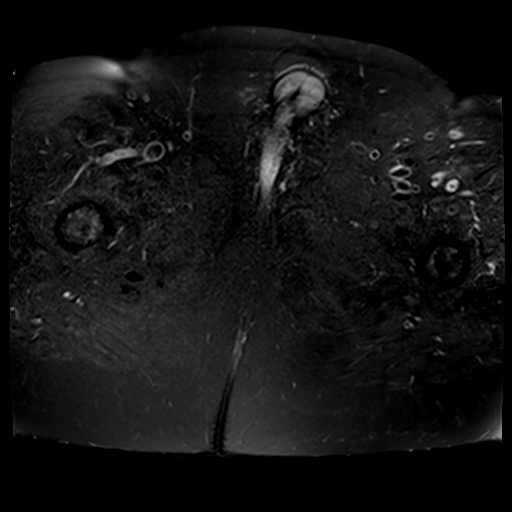
[im 12/30]
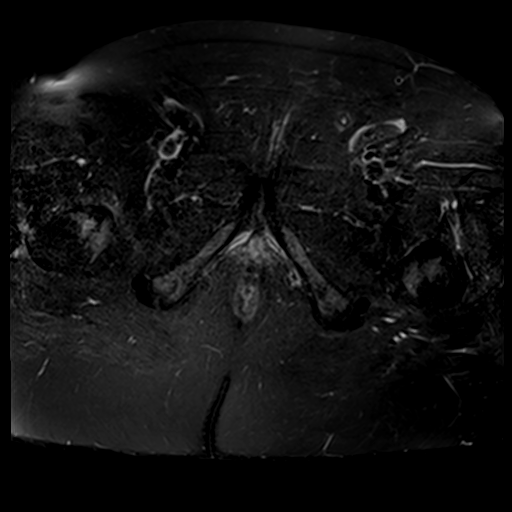
[im 18/30]
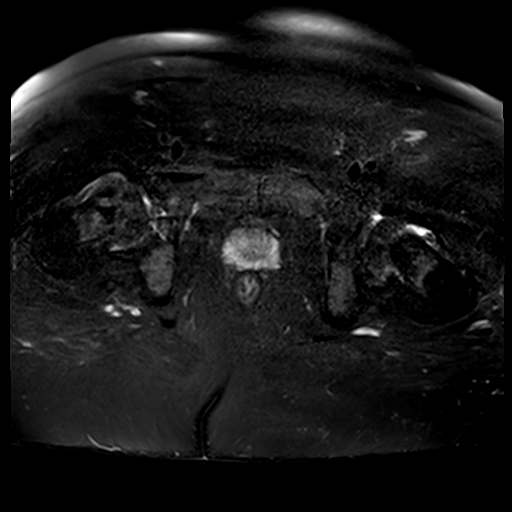
[im 24/30]
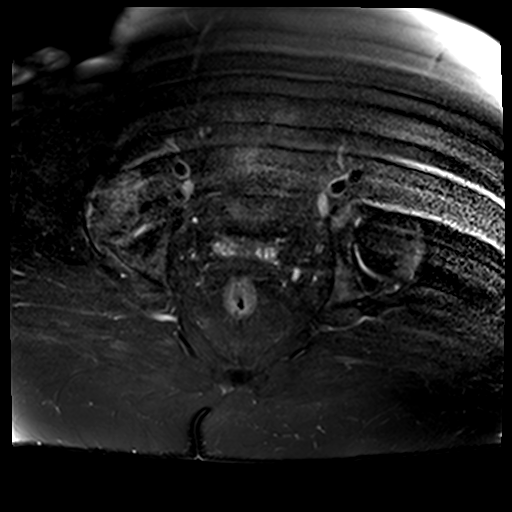
[im 30/30]
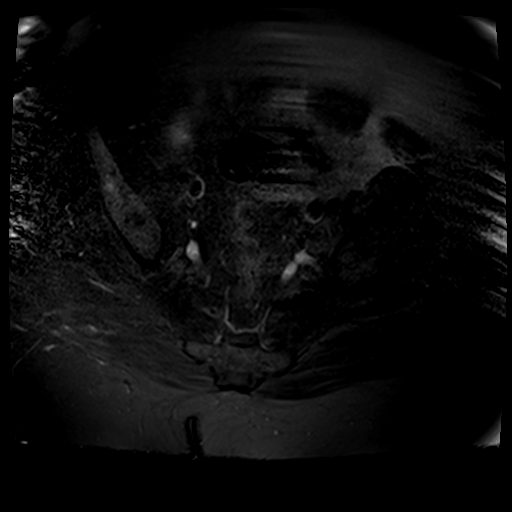

[Series 13: PD fat-sat · sagittal · B · 4.0mm · 0.70mm/px · 6 of 29 slices shown (1 of 3)]
[im 1/29]
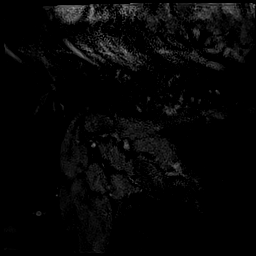
[im 6/29]
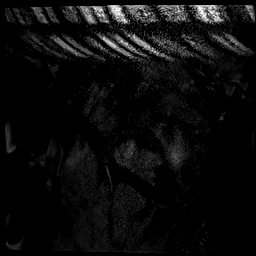
[im 12/29]
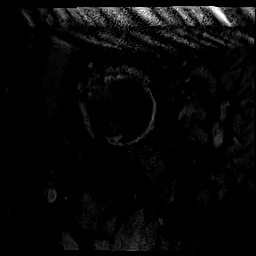
[im 17/29]
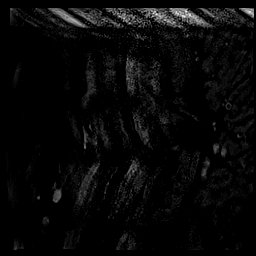
[im 23/29]
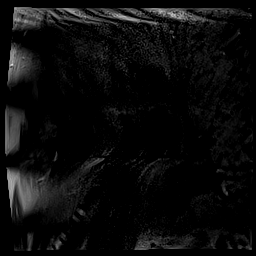
[im 29/29]
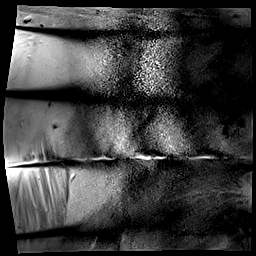

[Series 14: PD fat-sat · coronal · B · 4.0mm · 0.70mm/px · 6 of 29 slices shown (2 of 3)]
[im 1/29]
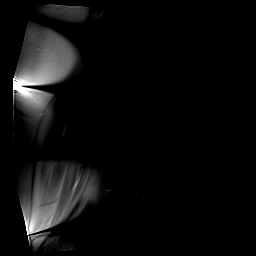
[im 6/29]
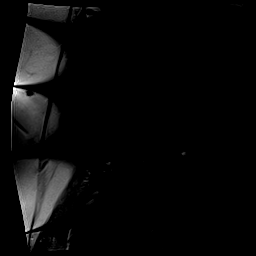
[im 12/29]
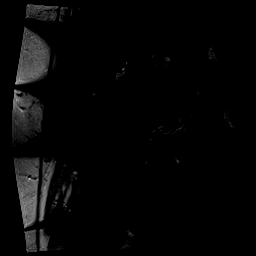
[im 17/29]
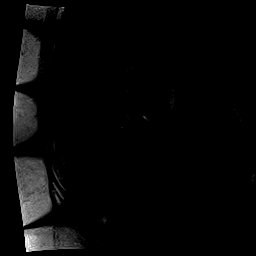
[im 23/29]
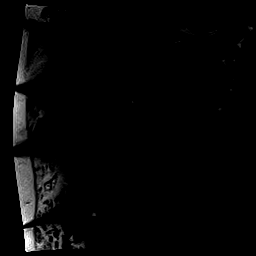
[im 29/29]
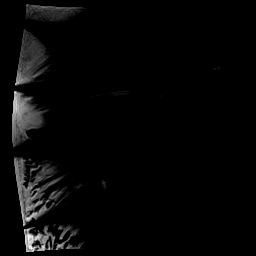

[Series 16: PD fat-sat · sagittal · B · 4.0mm · 0.70mm/px · 6 of 29 slices shown (3 of 3)]
[im 1/29]
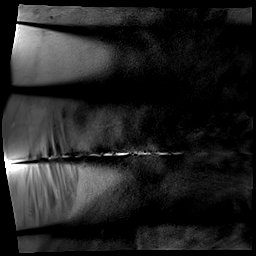
[im 6/29]
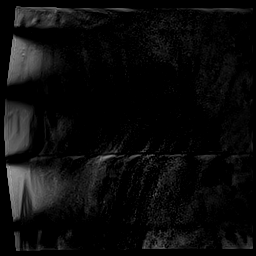
[im 12/29]
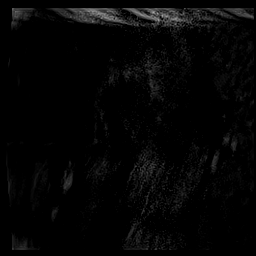
[im 17/29]
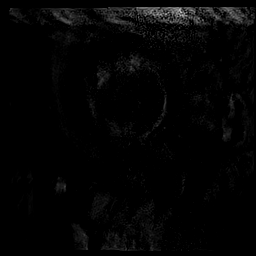
[im 23/29]
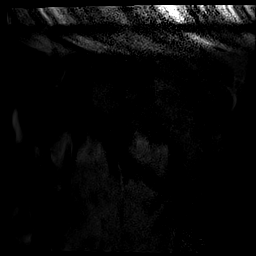
[im 29/29]
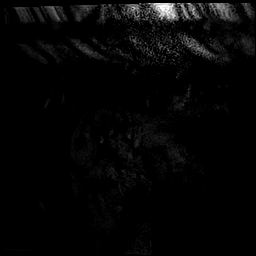

[40 of 40 positions shown; findings below may reference images not displayed]

FINDINGS: Patient motion degrades some series on this examination.

Bones: No fracture, stress change or worrisome lesion. There is some
osteophytosis about both hips and tiny subchondral cyst formation
bilaterally, more extensive on the right. No avascular necrosis of
the femoral heads.

Articular cartilage and labrum

Articular cartilage: Severely thinned bilaterally with bone-on-bone
joint space narrowing.

Labrum: Evaluation is limited due to patient motion. Both the right
and left anterior and superior labrum appear markedly degenerated
with associated tearing.

Joint or bursal effusion

Joint effusion:  None.

Bursae: Negative.

Muscles and tendons

Muscles and tendons:  Intact.

Other findings

Miscellaneous:   None.
IMPRESSION: Advanced bilateral hip osteoarthritis with associated degeneration
and tearing of the acetabular labrum appears worse on the right. The
exam is otherwise negative.

## 2021-04-20 IMAGING — MR MR HIP*L* W/O CM
8 series · 40 of 40 positions shown · non-contrast
Comparison: None.

CLINICAL DATA: Bilateral hip pain.

EXAM:
MR OF THE LEFT AND RIGHT HIP WITHOUT CONTRAST
TECHNIQUE: Multiplanar, multisequence MR imaging was performed. No intravenous
contrast was administered.

[Series 4: T1 · coronal · B · 4.0mm · 1.19mm/px · 5 of 40 slices shown]
[im 1/40]
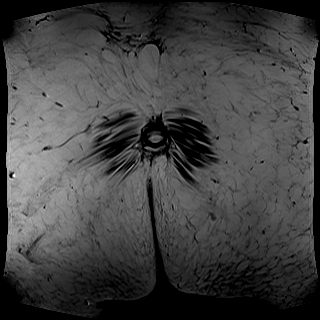
[im 10/40]
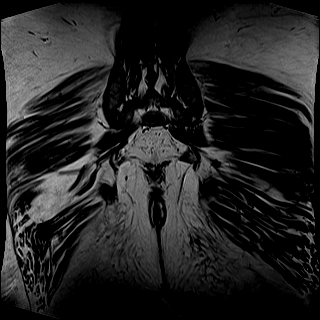
[im 20/40]
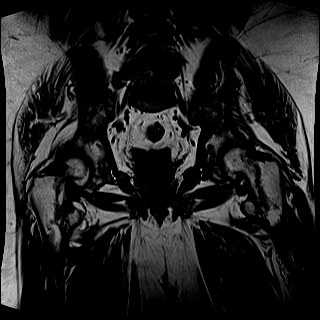
[im 30/40]
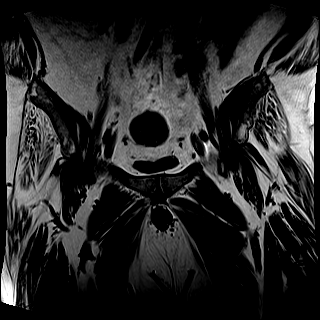
[im 40/40]
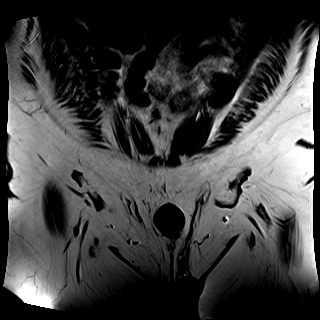

[Series 5: STIR · coronal · B · 4.0mm · 1.19mm/px · 5 of 40 slices shown]
[im 1/40]
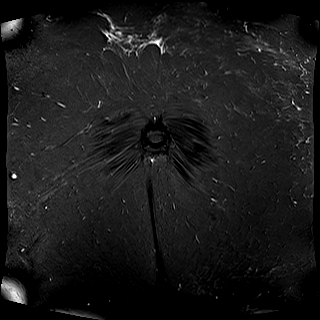
[im 10/40]
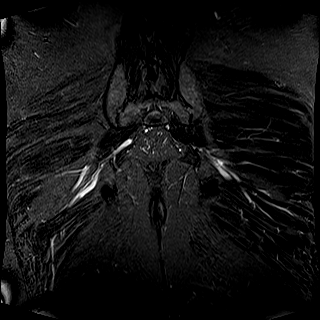
[im 20/40]
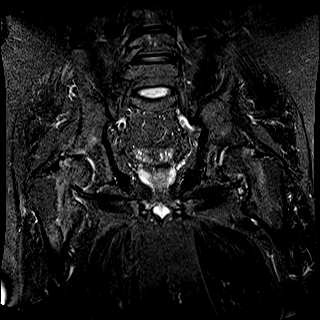
[im 30/40]
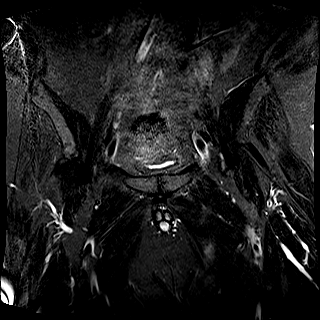
[im 40/40]
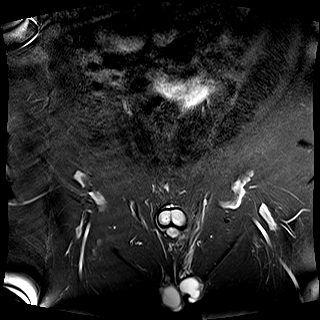

[Series 6: T2 fat-sat · axial · B · 4.0mm · 0.70mm/px · z∈[-74,+71]mm · 5 of 30 slices shown]
[im 1/30]
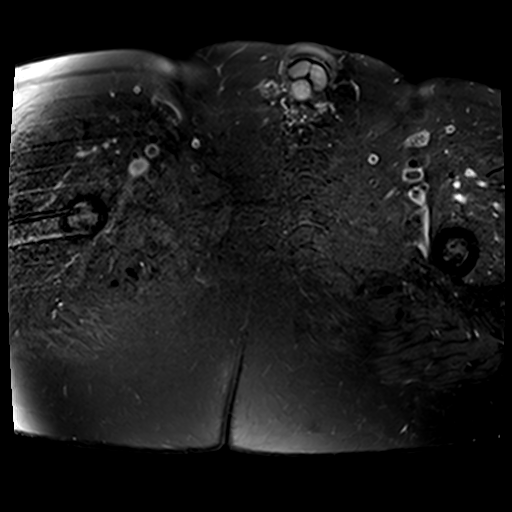
[im 8/30]
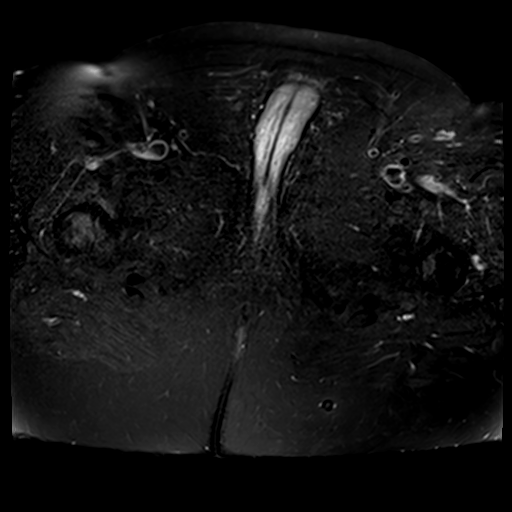
[im 15/30]
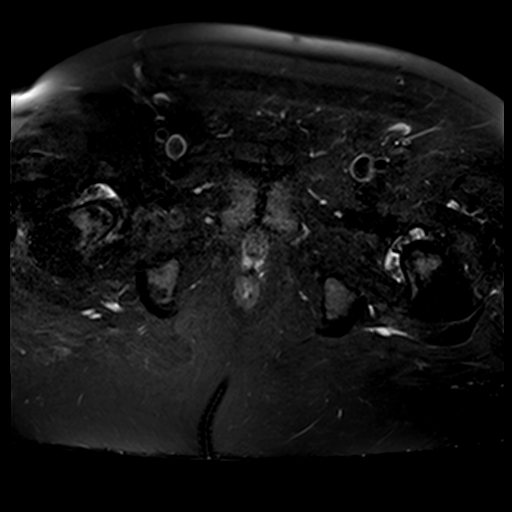
[im 22/30]
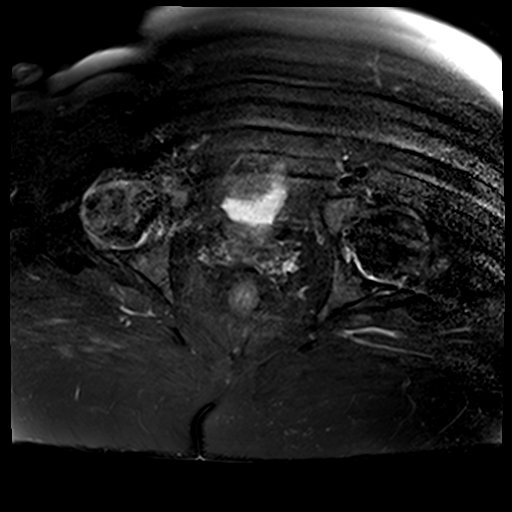
[im 30/30]
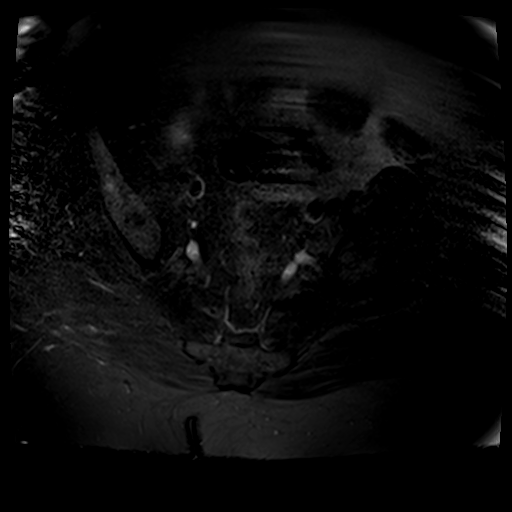

[Series 13: PD fat-sat · sagittal · B · 4.0mm · 0.70mm/px · 5 of 29 slices shown (1 of 5)]
[im 1/29]
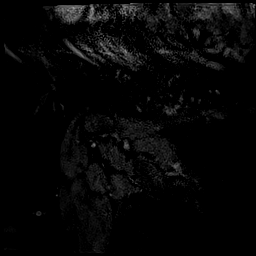
[im 8/29]
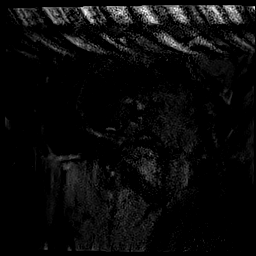
[im 15/29]
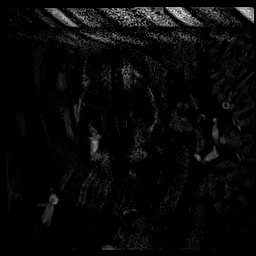
[im 22/29]
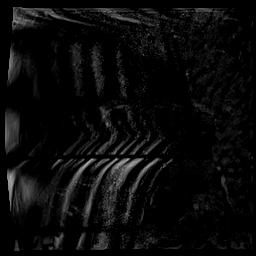
[im 29/29]
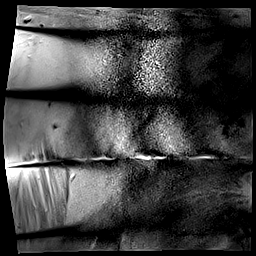

[Series 14: PD fat-sat · coronal · B · 4.0mm · 0.70mm/px · 5 of 29 slices shown (2 of 5)]
[im 1/29]
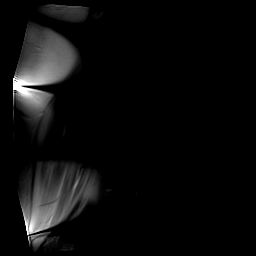
[im 8/29]
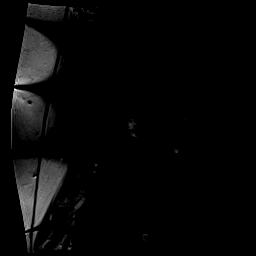
[im 15/29]
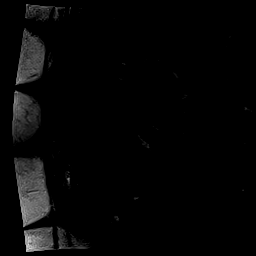
[im 22/29]
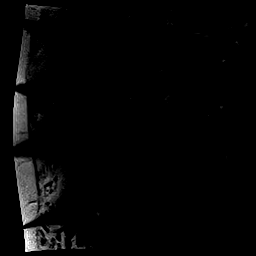
[im 29/29]
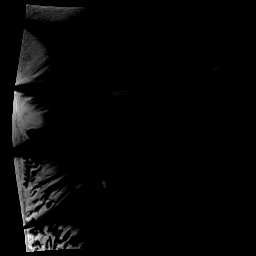

[Series 15: PD fat-sat · coronal · B · 4.0mm · 0.70mm/px · 5 of 29 slices shown (3 of 5)]
[im 1/29]
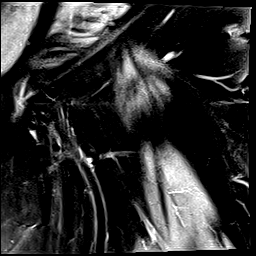
[im 8/29]
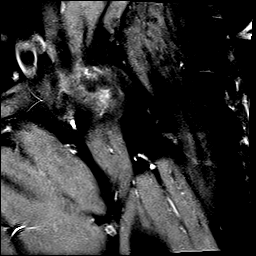
[im 15/29]
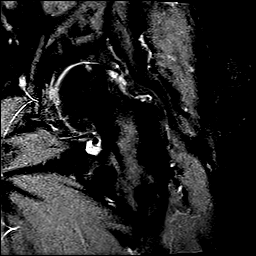
[im 22/29]
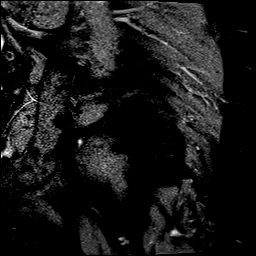
[im 29/29]
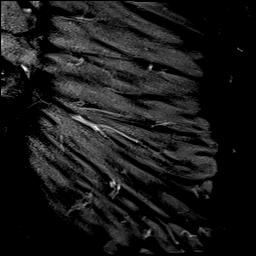

[Series 16: PD fat-sat · sagittal · B · 4.0mm · 0.70mm/px · 5 of 29 slices shown (4 of 5)]
[im 1/29]
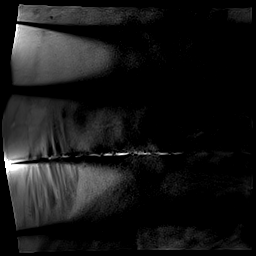
[im 8/29]
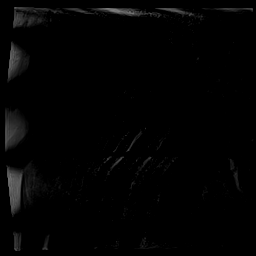
[im 15/29]
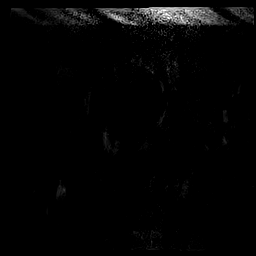
[im 22/29]
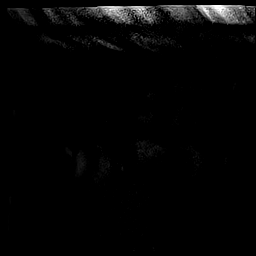
[im 29/29]
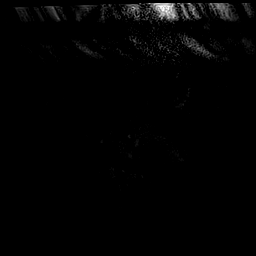

[Series 17: PD fat-sat · sagittal · B · 4.0mm · 0.70mm/px · 5 of 29 slices shown (5 of 5)]
[im 1/29]
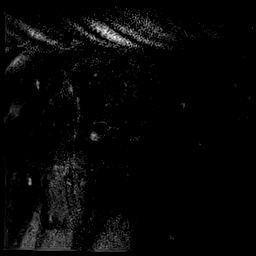
[im 8/29]
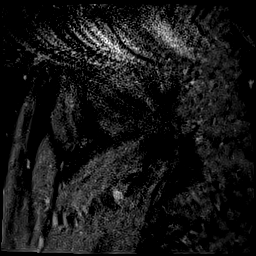
[im 15/29]
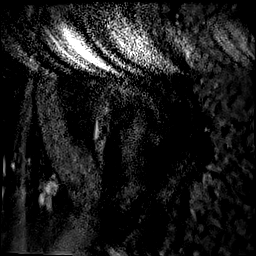
[im 22/29]
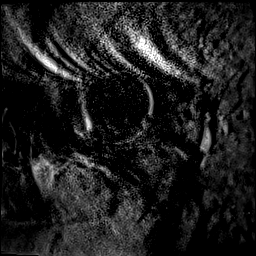
[im 29/29]
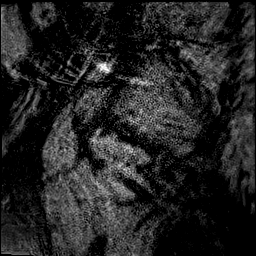

[40 of 40 positions shown; findings below may reference images not displayed]

FINDINGS: Patient motion degrades some series on this examination.

Bones: No fracture, stress change or worrisome lesion. There is some
osteophytosis about both hips and tiny subchondral cyst formation
bilaterally, more extensive on the right. No avascular necrosis of
the femoral heads.

Articular cartilage and labrum

Articular cartilage: Severely thinned bilaterally with bone-on-bone
joint space narrowing.

Labrum: Evaluation is limited due to patient motion. Both the right
and left anterior and superior labrum appear markedly degenerated
with associated tearing.

Joint or bursal effusion

Joint effusion:  None.

Bursae: Negative.

Muscles and tendons

Muscles and tendons:  Intact.

Other findings

Miscellaneous:   None.
IMPRESSION: Advanced bilateral hip osteoarthritis with associated degeneration
and tearing of the acetabular labrum appears worse on the right. The
exam is otherwise negative.

## 2021-04-20 IMAGING — CR DG SHOULDER 2+V*L*
3 series · 3 of 3 positions shown · non-contrast
Comparison: None.

CLINICAL DATA: Left shoulder pain.

EXAM:
LEFT SHOULDER - 2+ VIEW

[shoulder grashey]
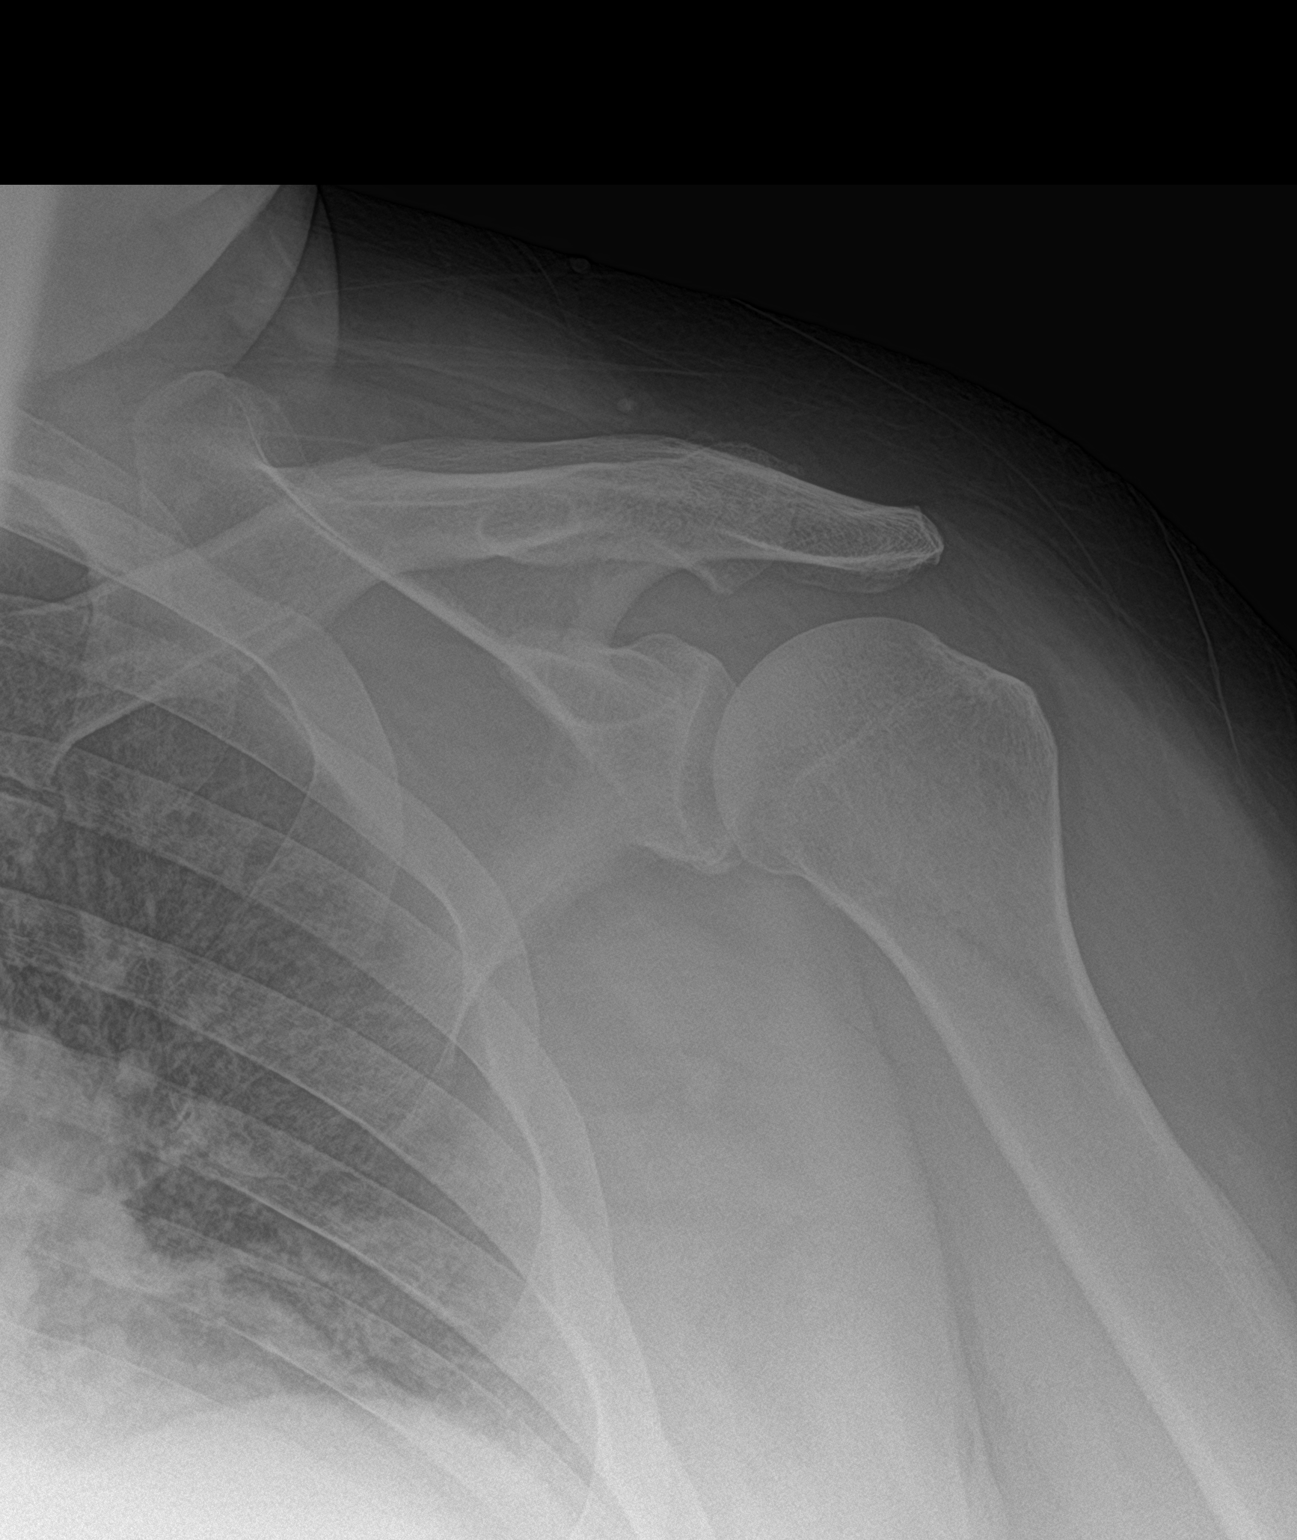

[shoulder y view]
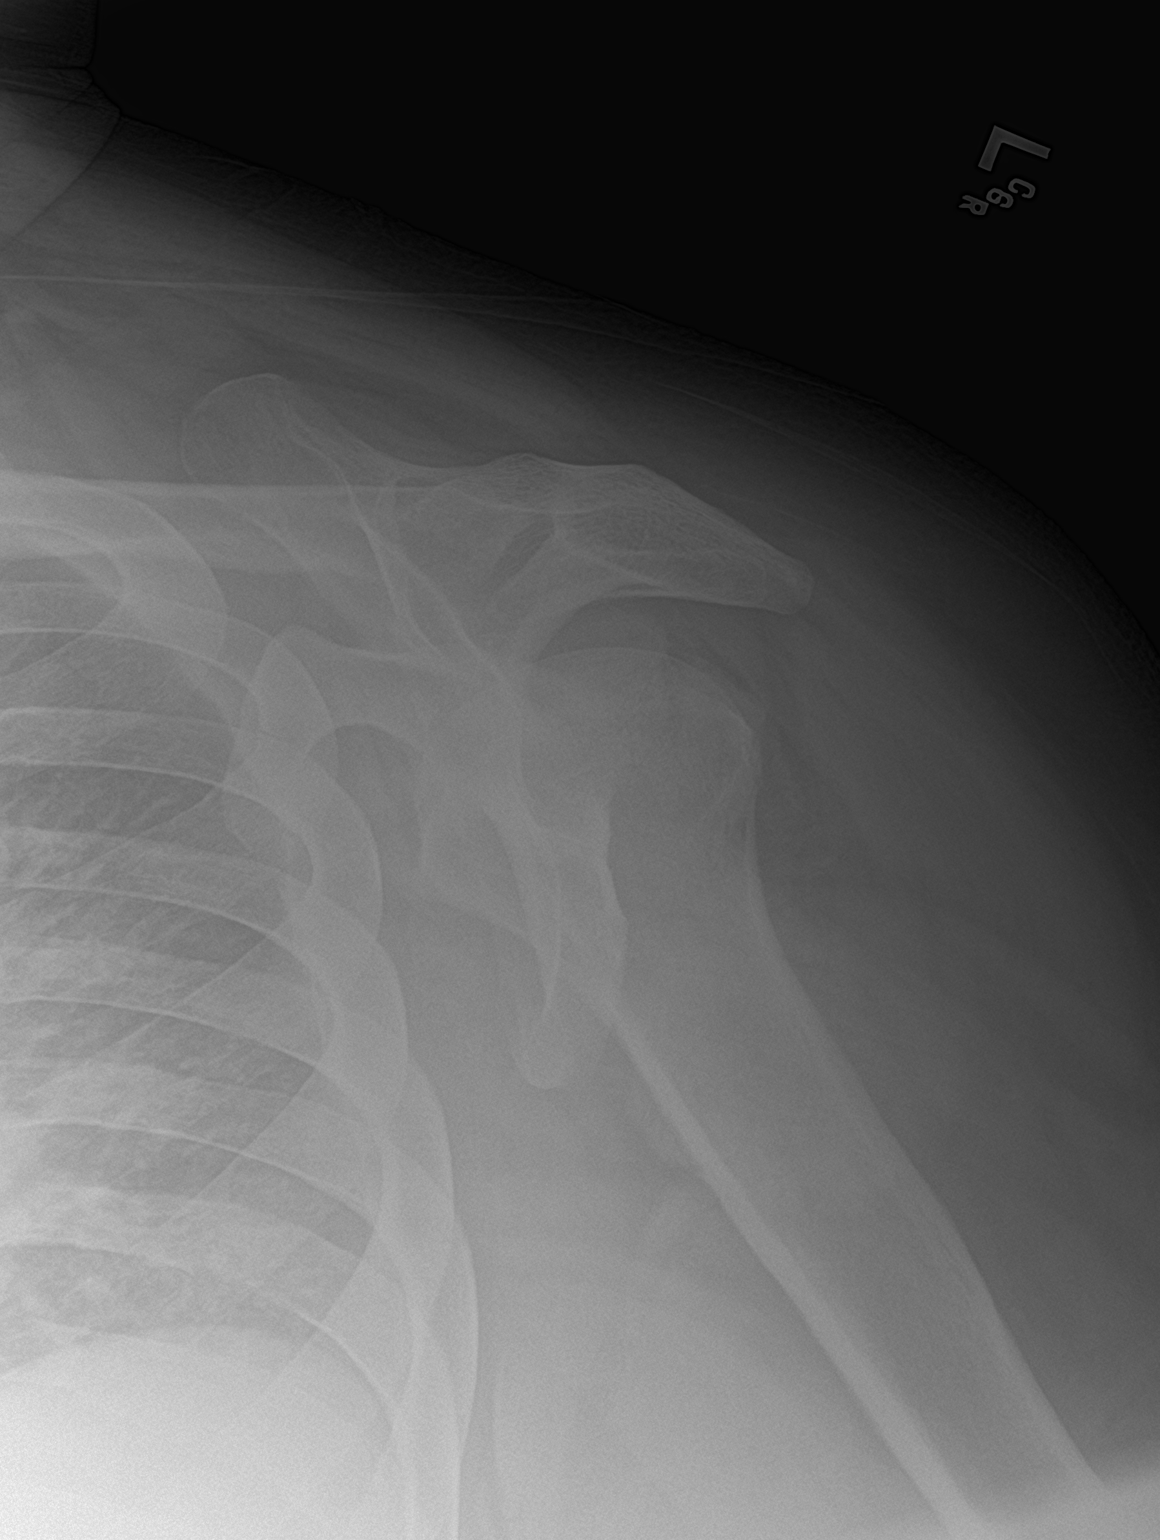

[shoulder ap neutral]
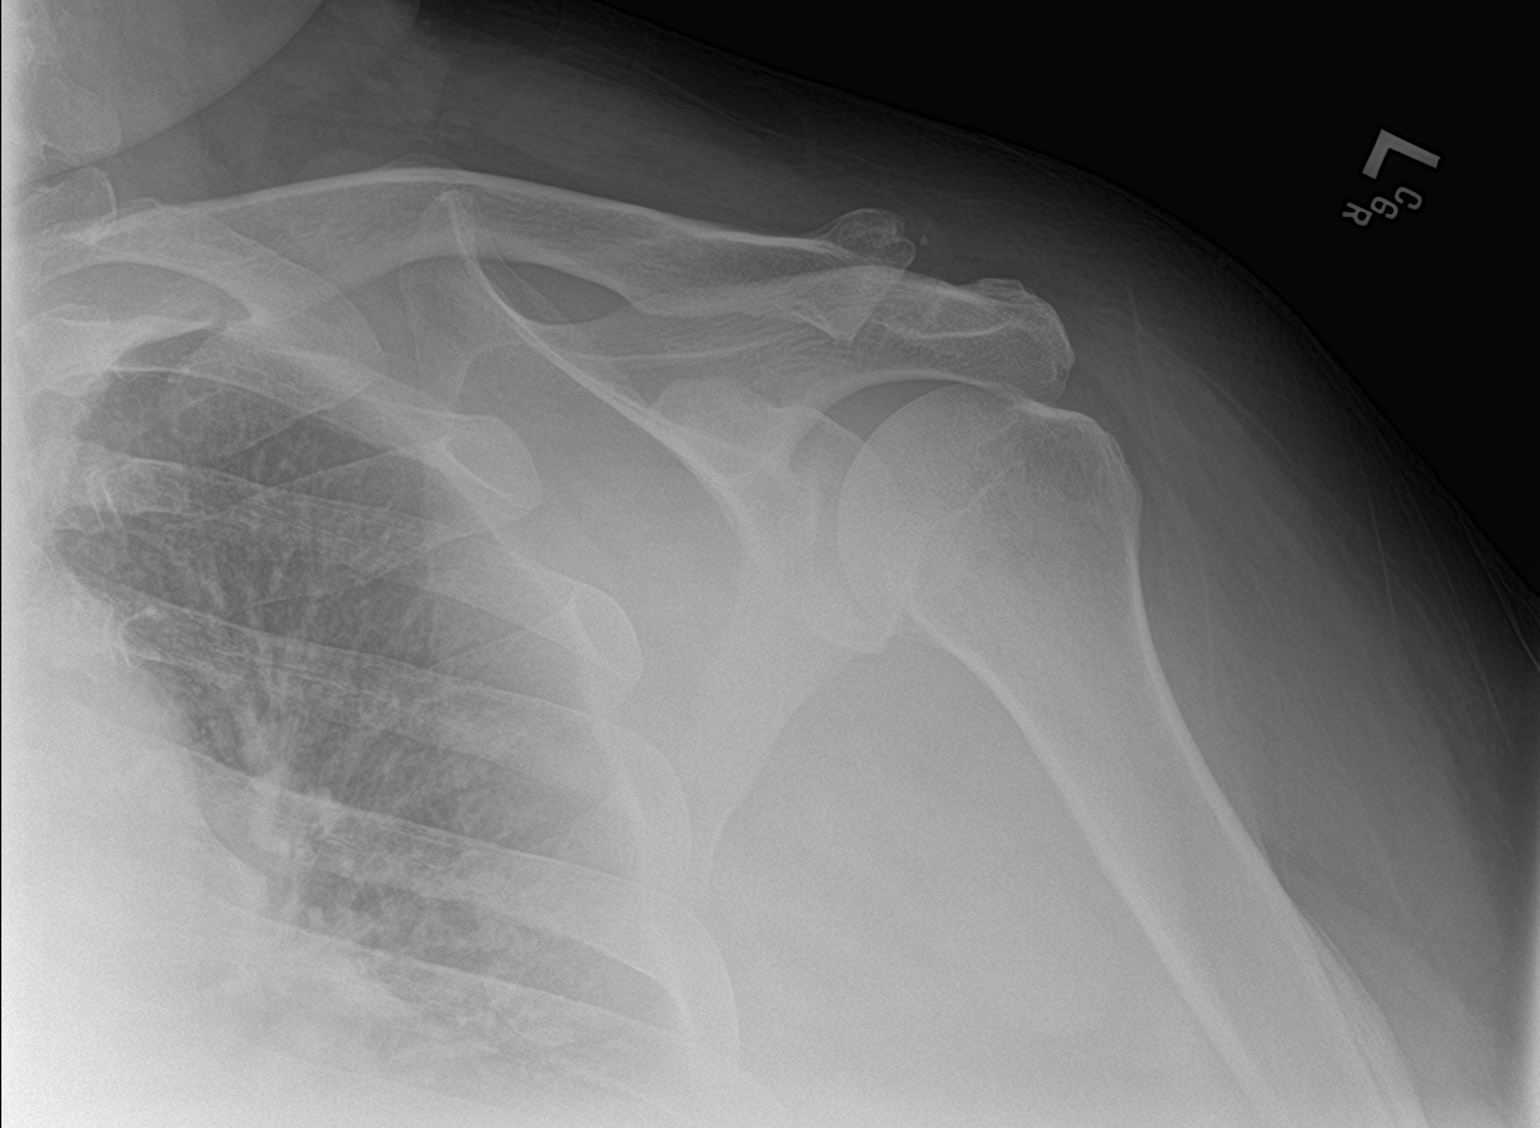

[3 of 3 positions shown; findings below may reference images not displayed]

FINDINGS: Normal alignment. Mild acromioclavicular spurring. Tiny subacromial
spur. Glenohumeral joint space is congruent. No fracture, erosion,
or avascular necrosis. Mild subcortical cystic change in the lateral
humeral head. No bony destruction or evidence of focal bone lesion.
The soft tissue calcifications are seen.
IMPRESSION: 1. Mild acromioclavicular osteoarthritis and subacromial spur.
2. Mild subcortical cystic change in the lateral humeral head, can
be seen with rotator cuff arthropathy.

## 2021-04-22 ENCOUNTER — Encounter: Payer: Self-pay | Admitting: Gastroenterology

## 2021-04-22 ENCOUNTER — Ambulatory Visit: Payer: Medicare Other | Admitting: *Deleted

## 2021-04-23 ENCOUNTER — Encounter: Payer: Self-pay | Admitting: Gastroenterology

## 2021-04-23 ENCOUNTER — Telehealth: Payer: Self-pay

## 2021-04-23 ENCOUNTER — Encounter
Admission: RE | Disposition: A | Discharge status: Home / Self Care | Payer: Self-pay | Source: Home / Self Care | Attending: Gastroenterology

## 2021-04-23 ENCOUNTER — Ambulatory Visit: Payer: Medicare Other | Admitting: Anesthesiology

## 2021-04-23 ENCOUNTER — Ambulatory Visit
Admission: RE | Admit: 2021-04-23 | Discharge: 2021-04-23 | Disposition: A | Discharge status: Home / Self Care | Payer: Medicare Other | Attending: Gastroenterology | Admitting: Gastroenterology

## 2021-04-23 DIAGNOSIS — I10 Essential (primary) hypertension: Secondary | ICD-10-CM | POA: Diagnosis not present

## 2021-04-23 DIAGNOSIS — D125 Benign neoplasm of sigmoid colon: Secondary | ICD-10-CM | POA: Insufficient documentation

## 2021-04-23 DIAGNOSIS — Z86010 Personal history of colonic polyps: Secondary | ICD-10-CM | POA: Diagnosis not present

## 2021-04-23 DIAGNOSIS — Z1211 Encounter for screening for malignant neoplasm of colon: Secondary | ICD-10-CM | POA: Insufficient documentation

## 2021-04-23 DIAGNOSIS — Z6841 Body Mass Index (BMI) 40.0 and over, adult: Secondary | ICD-10-CM | POA: Insufficient documentation

## 2021-04-23 DIAGNOSIS — D120 Benign neoplasm of cecum: Secondary | ICD-10-CM | POA: Diagnosis not present

## 2021-04-23 DIAGNOSIS — E785 Hyperlipidemia, unspecified: Secondary | ICD-10-CM | POA: Diagnosis not present

## 2021-04-23 DIAGNOSIS — D124 Benign neoplasm of descending colon: Secondary | ICD-10-CM | POA: Diagnosis not present

## 2021-04-23 DIAGNOSIS — K635 Polyp of colon: Secondary | ICD-10-CM

## 2021-04-23 DIAGNOSIS — E119 Type 2 diabetes mellitus without complications: Secondary | ICD-10-CM | POA: Diagnosis not present

## 2021-04-23 DIAGNOSIS — F17210 Nicotine dependence, cigarettes, uncomplicated: Secondary | ICD-10-CM | POA: Insufficient documentation

## 2021-04-23 HISTORY — PX: COLONOSCOPY WITH PROPOFOL: SHX5780

## 2021-04-23 LAB — GLUCOSE, CAPILLARY: Glucose-Capillary: 124 mg/dL — ABNORMAL HIGH (ref 70–99)

## 2021-04-23 SURGERY — COLONOSCOPY WITH PROPOFOL
Anesthesia: General

## 2021-04-23 MED ORDER — SODIUM CHLORIDE 0.9 % IV SOLN: INTRAVENOUS | Status: DC | PRN

## 2021-04-23 MED ORDER — PROPOFOL 10 MG/ML IV BOLUS
INTRAVENOUS | Status: DC | PRN
Start: 2021-04-23 — End: 2021-04-23
  Administered 2021-04-23: 60 mg via INTRAVENOUS

## 2021-04-23 MED ORDER — SODIUM CHLORIDE 0.9 % IV SOLN: INTRAVENOUS | Status: DC

## 2021-04-23 MED ORDER — PROPOFOL 500 MG/50ML IV EMUL
INTRAVENOUS | Status: DC | PRN
  Administered 2021-04-23: 150 ug/kg/min via INTRAVENOUS

## 2021-04-23 NOTE — Op Note (Signed)
[facility] ?Gastroenterology ?Patient Name: James [name] ?Procedure Date: 04/23/2021 1:39 PM ?MRN: [id number] ?Account #: [id number] ?Date of Birth: 06-Apr-1960 ?Admit Type: Outpatient ?Age: 61 ?Room: [facility] ENDO ROOM 3 ?Gender: Male ?Note Status: Finalized ?Instrument Name: Colonoscope [id number] ?Procedure:             Colonoscopy ?Indications:           Screening for colorectal malignant neoplasm ?Providers:             Jonathon Bellows MD, MD ?Referring MD:          Olin Hauser (Referring MD) ?Medicines:             Monitored Anesthesia Care ?Complications:         No immediate complications. ?Procedure:             Pre-Anesthesia Assessment: ?                       - Prior to the procedure, a History and Physical was  ?                       performed, and patient medications, allergies and  ?                       sensitivities were reviewed. The patient's tolerance  ?                       of previous anesthesia was reviewed. ?                       - The risks and benefits of the procedure and the  ?                       sedation options and risks were discussed with the  ?                       patient. All questions were answered and informed  ?                       consent was obtained. ?                       - ASA Grade Assessment: II - A patient with mild  ?                       systemic disease. ?                       After obtaining informed consent, the colonoscope was  ?                       passed under direct vision. Throughout the procedure,  ?                       the patient's blood pressure, pulse, and oxygen  ?                       saturations were monitored continuously. The  ?                       Colonoscope was introduced  through the anus and  ?                       advanced to the the cecum, identified by the  ?                       appendiceal orifice. The colonoscopy was performed  ?                       with ease. The patient tolerated the procedure  well.  ?                       The quality of the bowel preparation was good. ?Findings: ?     The perianal and digital rectal examinations were normal. ?     Two sessile polyps were found in the sigmoid colon. The polyps were 4 to  ?     5 mm in size. These polyps were removed with a cold snare. Resection and  ?     retrieval were complete. ?     A 5 mm polyp was found in the descending colon. The polyp was sessile.  ?     The polyp was removed with a cold snare. Resection and retrieval were  ?     complete. ?     A 10 mm polyp was found in the cecum. The polyp was sessile. The polyp  ?     was removed with a cold snare. Resection and retrieval were complete. To  ?     prevent bleeding after the polypectomy, one hemostatic clip was  ?     successfully placed. There was no bleeding during, or at the end, of the  ?     procedure. ?     The exam was otherwise without abnormality on direct and retroflexion  ?     views. ?Impression:            - Two 4 to 5 mm polyps in the sigmoid colon, removed  ?                       with a cold snare. Resected and retrieved. ?                       - One 5 mm polyp in the descending colon, removed with  ?                       a cold snare. Resected and retrieved. ?                       - One 10 mm polyp in the cecum, removed with a cold  ?                       snare. Resected and retrieved. Clip was placed. ?                       - The examination was otherwise normal on direct and  ?                       retroflexion views. ?Recommendation:        - Discharge patient to home (with escort). ?                       -  Resume previous diet. ?                       - Continue present medications. ?                       - Await pathology results. ?                       - Repeat colonoscopy in 3 years for surveillance based  ?                       on pathology results. ?Procedure Code(s):     --- Professional --- ?                       [phone], Colonoscopy, flexible; with removal  of  ?                       tumor(s), polyp(s), or other lesion(s) by snare  ?                       technique ?Diagnosis Code(s):     --- Professional --- ?                       K63.5, Polyp of colon ?                       Z12.11, Encounter for screening for malignant neoplasm  ?                       of colon ?CPT copyright 2019 [employer]. All rights reserved. ?The codes documented in this report are preliminary and upon coder review may  ?be revised to meet current compliance requirements. ?Jonathon Bellows, MD ?Jonathon Bellows MD, MD ?04/23/2021 2:17:27 PM ?This report has been signed electronically. ?Number of Addenda: 0 ?Note Initiated On: 04/23/2021 1:39 PM ?Scope Withdrawal Time: 0 hours 23 minutes 32 seconds  ?Total Procedure Duration: 0 hours 27 minutes 10 seconds  ?Estimated Blood Loss:  Estimated blood loss: none. ?     [facility] ?

## 2021-04-23 NOTE — H&P (Signed)
?Jonathon Bellows, MD ?[address] [place], [place], [place], [address] ?[address], [place], [place], [place], [address] ?Phone: [phone]  ?Fax: [phone] ? ?Primary Care Physician:  Olin Hauser, DO ? ? ?Pre-Procedure History & Physical: ?HPI:  Victoria Euceda is a 61 y.o. male is here for an colonoscopy. ?  ?Past Medical History:  ?Diagnosis Date  ? Arthritis   ? Diabetes mellitus without complication ([place])   ? Hyperlipidemia   ? Hypertension   ? ? ?Past Surgical History:  ?Procedure Laterality Date  ? APPENDECTOMY    ? COLONOSCOPY WITH PROPOFOL    ? ? ?Prior to Admission medications   ?Medication Sig Start Date End Date Taking? Authorizing Provider  ?atorvastatin (LIPITOR) 40 MG tablet Take 1 tablet (40 mg total) by mouth daily. 10/30/20 10/30/21 Yes Merlyn Lot, MD  ?hydrochlorothiazide (HYDRODIURIL) 25 MG tablet Take 1 tablet (25 mg total) by mouth daily. 02/06/21  Yes Karamalegos, Devonne Doughty, DO  ?insulin degludec ([name]) 100 UNIT/ML FlexTouch Pen Inject 25 Units into the skin daily. 03/16/21  Yes Karamalegos, Devonne Doughty, DO  ?losartan (COZAAR) 25 MG tablet Take 1 tablet (25 mg total) by mouth daily. 10/30/20 10/30/21 Yes Merlyn Lot, MD  ?metFORMIN (GLUCOPHAGE) 1000 MG tablet Take 1 tablet (1,000 mg total) by mouth daily with breakfast. 03/17/21  Yes Parks Ranger Devonne Doughty, DO  ?Oxycodone HCl 10 MG TABS Take 1 tablet (10 mg total) by mouth in the morning and at bedtime. Must last 30 days. 03/29/21 04/28/21 Yes Milinda Pointer, MD  ?pregabalin (LYRICA) 150 MG capsule Take 2 caps for '300mg'$  in AM, and take 1 cap for '150mg'$  at night 03/16/21  Yes Karamalegos, Devonne Doughty, DO  ?Cholecalciferol (VITAMIN D3) 125 MCG (5000 UT) CAPS Take 1 capsule (5,000 Units total) by mouth daily with breakfast. Take along with calcium and magnesium. 03/29/21 06/27/21  Milinda Pointer, MD  ?ergocalciferol (VITAMIN D2) 1.25 MG (50000 UT) capsule Take 1 capsule (50,000 Units total) by mouth 2  (two) times a week. X 6 weeks. 03/29/21 05/10/21  Milinda Pointer, MD  ?OZEMPIC, 0.25 OR 0.5 MG/DOSE, 2 MG/1.5ML SOPN Inject 0.5 mg into the skin once a week. 03/17/21   Karamalegos, Devonne Doughty, DO  ?varenicline (CHANTIX CONTINUING MONTH PAK) 1 MG tablet Take 1 tablet (1 mg total) by mouth 2 (two) times daily. 02/19/21   Olin Hauser, DO  ? ? ?Allergies as of 04/16/2021  ? (No Known Allergies)  ? ? ?Family History  ?Problem Relation Age of Onset  ? Heart disease Mother   ? Arthritis Mother   ? Arthritis Father   ? ? ?Social History  ? ?Socioeconomic History  ? Marital status: Single  ?  Spouse name: Not on file  ? Number of children: Not on file  ? Years of education: Not on file  ? Highest education level: Not on file  ?Occupational History  ? Not on file  ?Tobacco Use  ? Smoking status: Some Days  ?  Packs/day: 0.10  ?  Years: 25.00  ?  Pack years: 2.50  ?  Types: Cigarettes  ? Smokeless tobacco: Never  ?Vaping Use  ? Vaping Use: Never used  ?Substance and Sexual Activity  ? Alcohol use: Never  ? Drug use: Never  ? Sexual activity: Not on file  ?Other Topics Concern  ? Not on file  ?Social History Narrative  ? Not on file  ? ?Social Determinants of Health  ? ?Financial Resource Strain: Low Risk   ?  Difficulty of Paying Living Expenses: Not hard at all  ?Food Insecurity: No Food Insecurity  ? Worried About Charity fundraiser in the Last Year: Never true  ? Ran Out of Food in the Last Year: Never true  ?Transportation Needs: No Transportation Needs  ? Lack of Transportation (Medical): No  ? Lack of Transportation (Non-Medical): No  ?Physical Activity: Inactive  ? Days of Exercise per Week: 0 days  ? Minutes of Exercise per Session: 0 min  ?Stress: No Stress Concern Present  ? Feeling of Stress : Only a little  ?Social Connections: Socially Isolated  ? Frequency of Communication with Friends and Family: Three times a week  ? Frequency of Social Gatherings with Friends and Family: Once a week  ? Attends  Religious Services: Never  ? Active Member of Clubs or Organizations: No  ? Attends Archivist Meetings: Never  ? Marital Status: Divorced  ?Intimate Partner Violence: Not At Risk  ? Fear of Current or Ex-Partner: No  ? Emotionally Abused: No  ? Physically Abused: No  ? Sexually Abused: No  ? ? ?Review of Systems: ?See HPI, otherwise negative ROS ? ?Physical Exam: ?BP 104/79   Pulse 82   Temp (!) 96.3 ?F (35.7 ?C) (Temporal)   Resp 18   Ht 6' (1.829 m)   Wt (!) 164.2 kg   SpO2 98%   BMI 49.10 kg/m?  ?General:   Alert,  pleasant and cooperative in NAD ?Head:  Normocephalic and atraumatic. ?Neck:  Supple; no masses or thyromegaly. ?Lungs:  Clear throughout to auscultation, normal respiratory effort.    ?Heart:  +S1, +S2, Regular rate and rhythm, No edema. ?Abdomen:  Soft, nontender and nondistended. Normal bowel sounds, without guarding, and without rebound.   ?Neurologic:  Alert and  oriented x4;  grossly normal neurologically. ? ?Impression/Plan: ?Blayne Frankie is here for an colonoscopy to be performed for surveillance due to prior history of colon polyps  ? ?Risks, benefits, limitations, and alternatives regarding  colonoscopy have been reviewed with the patient.  Questions have been answered.  All parties agreeable. ? ? ?Jonathon Bellows, MD  04/23/2021, 1:33 PM ? ?

## 2021-04-23 NOTE — Anesthesia Preprocedure Evaluation (Signed)
Anesthesia Evaluation  ?Patient identified by MRN, date of birth, ID band ?Patient awake ? ? ? ?Reviewed: ?Allergy & Precautions, NPO status , Patient's Chart, lab work & pertinent test results ? ?Airway ?Mallampati: III ? ?TM Distance: >3 FB ?Neck ROM: Full ? ? ? Dental ? ?(+) Upper Dentures, Lower Dentures ?  ?Pulmonary ?neg pulmonary ROS, Current Smoker,  ?  ?Pulmonary exam normal ? ?+ decreased breath sounds ? ? ? ? ? Cardiovascular ?Exercise Tolerance: Poor ?hypertension, Pt. on medications ?negative cardio ROS ?Normal cardiovascular exam ?Rhythm:Regular Rate:Normal ? ? ?  ?Neuro/Psych ?negative neurological ROS ? negative psych ROS  ? GI/Hepatic ?negative GI ROS, Neg liver ROS,   ?Endo/Other  ?negative endocrine ROSdiabetes, Well Controlled, Type 2Morbid obesity ? Renal/GU ?negative Renal ROS  ?negative genitourinary ?  ?Musculoskeletal ? ?(+) Arthritis ,  ? Abdominal ?(+) + obese,   ?Peds ?negative pediatric ROS ?(+)  Hematology ?negative hematology ROS ?(+)   ?Anesthesia Other Findings ?Past Medical History: ?No date: Arthritis ?No date: Diabetes mellitus without complication ([place]) ?No date: Hyperlipidemia ?No date: Hypertension ? ?Past Surgical History: ?No date: APPENDECTOMY ?No date: COLONOSCOPY WITH PROPOFOL ? ?BMI   ? Body Mass Index: 49.10 kg/m?  ?  ? ? Reproductive/Obstetrics ?negative OB ROS ? ?  ? ? ? ? ? ? ? ? ? ? ? ? ? ?  ?  ? ? ? ? ? ? ? ? ?Anesthesia Physical ?Anesthesia Plan ? ?ASA: 3 ? ?Anesthesia Plan: General  ? ?Post-op Pain Management:   ? ?Induction: Intravenous ? ?PONV Risk Score and Plan: Propofol infusion and TIVA ? ?Airway Management Planned: Natural Airway and Nasal Cannula ? ?Additional Equipment:  ? ?Intra-op Plan:  ? ?Post-operative Plan:  ? ?Informed Consent: I have reviewed the patients History and Physical, chart, labs and discussed the procedure including the risks, benefits and alternatives for the proposed anesthesia with the patient or  authorized representative who has indicated his/her understanding and acceptance.  ? ? ? ?Dental Advisory Given ? ?Plan Discussed with: CRNA and Surgeon ? ?Anesthesia Plan Comments:   ? ? ? ? ? ? ?Anesthesia Quick Evaluation ? ?

## 2021-04-23 NOTE — Transfer of Care (Signed)
Immediate Anesthesia Transfer of Care Note ? ?Patient: James [name] ? ?Procedure(s) Performed: COLONOSCOPY WITH PROPOFOL ? ?Patient Location: PACU ? ?Anesthesia Type:General ? ?Level of Consciousness: awake, alert  and oriented ? ?Airway & Oxygen Therapy: Patient Spontanous Breathing ? ?Post-op Assessment: Report given to RN and Post -op Vital signs reviewed and stable ? ?Post vital signs: Reviewed and stable ? ?Last Vitals:  ?Vitals Value Taken Time  ?BP 163/137 04/23/21 1418  ?Temp 35.6 ?C 04/23/21 1418  ?Pulse 87 04/23/21 1418  ?Resp 15 04/23/21 1418  ?SpO2 99 % 04/23/21 1418  ? ? ?Last Pain:  ?Vitals:  ? 04/23/21 1418  ?TempSrc: Temporal  ?   ? ?  ? ?Complications: No notable events documented. ?

## 2021-04-23 NOTE — Telephone Encounter (Signed)
Patient  called stating he needs his medicaitons.  When I looked at his chart, he has an appointment for 05-19-2021 but his meds run out 04-29-2021.  The last note from Dr Dossie Arbour says to return around 04-29-2021 for med refill.  His appt on 5-3 2023 says it is a 2nd visit but he has already had a 2nd visit on 03-29-2021.  Please schedule him an appointment asap for a med refill and look at the next appointment and see why it is put in as a 2nd visit.  Thank you  ?

## 2021-04-23 NOTE — Anesthesia Procedure Notes (Signed)
Date/Time: 04/23/2021 1:41 PM ?Performed by: Johnna Acosta, CRNA ?Pre-anesthesia Checklist: Patient identified, Emergency Drugs available, Suction available, Patient being monitored and Timeout performed ?Patient Re-evaluated:Patient Re-evaluated prior to induction ?Oxygen Delivery Method: Nasal cannula ?Preoxygenation: Pre-oxygenation with 100% oxygen ?Induction Type: IV induction ? ? ? ? ?

## 2021-04-25 NOTE — Progress Notes (Signed)
Non-identified Voicemail.  No Message Left. 

## 2021-04-26 ENCOUNTER — Ambulatory Visit: Payer: Medicare Other | Admitting: Podiatry

## 2021-04-26 ENCOUNTER — Encounter: Payer: Self-pay | Admitting: Gastroenterology

## 2021-04-26 DIAGNOSIS — B351 Tinea unguium: Secondary | ICD-10-CM | POA: Diagnosis not present

## 2021-04-26 DIAGNOSIS — E1142 Type 2 diabetes mellitus with diabetic polyneuropathy: Secondary | ICD-10-CM

## 2021-04-26 DIAGNOSIS — M79675 Pain in left toe(s): Secondary | ICD-10-CM | POA: Diagnosis not present

## 2021-04-26 DIAGNOSIS — M79674 Pain in right toe(s): Secondary | ICD-10-CM | POA: Diagnosis not present

## 2021-04-26 DIAGNOSIS — E1140 Type 2 diabetes mellitus with diabetic neuropathy, unspecified: Secondary | ICD-10-CM | POA: Insufficient documentation

## 2021-04-26 DIAGNOSIS — M129 Arthropathy, unspecified: Secondary | ICD-10-CM

## 2021-04-26 DIAGNOSIS — M2142 Flat foot [pes planus] (acquired), left foot: Secondary | ICD-10-CM

## 2021-04-26 DIAGNOSIS — M2141 Flat foot [pes planus] (acquired), right foot: Secondary | ICD-10-CM

## 2021-04-26 NOTE — Progress Notes (Signed)
This patient returns to my office for at risk foot care.  This patient requires this care by a professional since this patient will be at risk due to having diabetic neuropathy.  This patient is unable to cut nails himself since the patient cannot reach his nails.These nails are painful walking and wearing shoes.  This patient presents for at risk foot care today.  General Appearance  Alert, conversant and in no acute stress.  Vascular  Dorsalis pedis and posterior tibial  pulses are palpable  bilaterally.  Capillary return is within normal limits  bilaterally. Temperature is within normal limits  bilaterally.  Neurologic  Senn-Weinstein monofilament wire test diminished  bilaterally. Muscle power within normal limits bilaterally.  Nails Thick disfigured discolored nails with subungual debris  from hallux to fifth toes bilaterally. No evidence of bacterial infection or drainage bilaterally.  Orthopedic  No limitations of motion  feet .  No crepitus or effusions noted.  No bony pathology or digital deformities noted.  Pes planus  .  DJD 1st MPJ  B/L.  Skin  normotropic skin with no porokeratosis noted bilaterally.  No signs of infections or ulcers noted.     Onychomycosis  Pain in right toes  Pain in left toes  Consent was obtained for treatment procedures.   Mechanical debridement of nails 1-5  bilaterally performed with a nail nipper.  Filed with dremel without incident. 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  Patient to make an appointment with Brian.   Return office visit   3 months                  Told patient to return for periodic foot care and evaluation due to potential at risk complications.   Quynn Vilchis DPM   

## 2021-04-27 ENCOUNTER — Encounter: Payer: Self-pay | Admitting: *Deleted

## 2021-04-27 ENCOUNTER — Encounter: Payer: Medicare Other | Attending: Pain Medicine | Admitting: *Deleted

## 2021-04-27 ENCOUNTER — Ambulatory Visit (INDEPENDENT_AMBULATORY_CARE_PROVIDER_SITE_OTHER): Payer: Medicare Other | Admitting: Licensed Clinical Social Worker

## 2021-04-27 VITALS — BP 128/80 | Wt 348.2 lb

## 2021-04-27 DIAGNOSIS — M5136 Other intervertebral disc degeneration, lumbar region: Secondary | ICD-10-CM

## 2021-04-27 DIAGNOSIS — Z6841 Body Mass Index (BMI) 40.0 and over, adult: Secondary | ICD-10-CM | POA: Insufficient documentation

## 2021-04-27 DIAGNOSIS — Z713 Dietary counseling and surveillance: Secondary | ICD-10-CM | POA: Insufficient documentation

## 2021-04-27 DIAGNOSIS — E119 Type 2 diabetes mellitus without complications: Secondary | ICD-10-CM | POA: Diagnosis not present

## 2021-04-27 DIAGNOSIS — G894 Chronic pain syndrome: Secondary | ICD-10-CM

## 2021-04-27 DIAGNOSIS — M159 Polyosteoarthritis, unspecified: Secondary | ICD-10-CM | POA: Diagnosis not present

## 2021-04-27 DIAGNOSIS — Z794 Long term (current) use of insulin: Secondary | ICD-10-CM | POA: Insufficient documentation

## 2021-04-27 LAB — SURGICAL PATHOLOGY

## 2021-04-27 NOTE — Patient Instructions (Signed)
Visit Information ? ?Thank you for taking time to visit with me today. Please don't hesitate to contact me if I can be of assistance to you before our next scheduled telephone appointment. ? ?Following are the goals we discussed today:  ?Patient Goals/Self-Care Activities: Over the next 120 days ?Attend all scheduled medical appointments ?Utilize healthy coping skills and/or supportive resources discussed ?Contact PCP office with any questions or concerns ? ?Our next appointment is by telephone on 06/08/21 at 10:45 AM ? ?Please call the care guide team at [phone] if you need to cancel or reschedule your appointment.  ? ?If you are experiencing a Mental Health or [place] or need someone to talk to, please call the Suicide and Crisis Lifeline: 988  ? ?Following is a copy of your full plan of care:  ?Care Plan : [place]  ?Updates made by Rebekah [place]field, LCSW since 04/27/2021 12:00 AM  ?  ? ?Problem: Barriers to Treatment   ?  ? ?Goal: Barriers to Treatment Identified and Managed   ?Start Date: 04/27/2021  ?This Visit's Progress: On track  ?Priority: High  ?Note:   ?Current barriers:   ? Limited access to food and Housing barriers ?Clinical Goals: Patient will work with CCM LCSW to address needs related to psychosocial stressors to improve management of health conditions ?Clinical Interventions:  ?Assessment of needs, barriers , agencies contacted, as well as how impacting care ?Patient reports difficulty managing health conditions triggered by chronic pain that limits ability to cook ?Patient resides with brother. He is interested in wheelchair accessible income-based housing. Pt has completed application for [facility] and agreed to continue follow up with leasing office ?Pt denies depression and anxiety symptoms. Was successful in identifying healthy coping skills (reading Bible) ?Depression screen reviewed  ?Solution-Focused Strategies employed:  ?Active listening / Reflection  utilized  ?Emotional Support Provided ?Verbalization of feelings encouraged  ?Made referral to Care Guide to assist with resources for food insecurity-Pt interested in Meals on Wheels ?Review various resources, discussed options and provided patient information about  ?Community food options  ?Housing resources (Pt has applied for [employer]) ?1:1 collaboration with primary care provider regarding development and update of comprehensive plan of care as evidenced by provider attestation and co-signature ?Inter-disciplinary care team collaboration (see longitudinal plan of care) ?Patient Goals/Self-Care Activities: Over the next 120 days ?Attend all scheduled medical appointments ?Utilize healthy coping skills and/or supportive resources discussed ?Contact PCP office with any questions or concerns ?  ?  ? ? ?Mr. Dalby was given information about Care Management services by the embedded care coordination team including:  ?Care Management services include personalized support from designated clinical staff supervised by his physician, including individualized plan of care and coordination with other care providers ?24/7 contact phone numbers for assistance for urgent and routine care needs. ?The patient may stop CCM services at any time (effective at the end of the month) by phone call to the office staff. ? ?Patient agreed to services and verbal consent obtained.  ? ?The patient verbalized understanding of instructions, educational materials, and care plan provided today and declined offer to receive copy of patient instructions, educational materials, and care plan.  ? ? ?Christa See, MSW, LCSW ?[facility] Care Management ?[place] [facility] ?[email or web address] ?Phone [phone] ?9:50 AM ? ? ?  ?

## 2021-04-27 NOTE — Patient Instructions (Signed)
Check blood sugars before breakfast or 2 hrs after one meal every day ? ?Exercise:  Chair exercise or walk as tolerated ? ?Eat 3 meals day,   1-2  snacks a day ?Space meals 4-6 hours apart ?Don't skip meals - eat at least 1 protein and 1 carbohydrate serving ?Continue to limit desserts/sweets ? ?Quit smoking ? ?Carry fast acting glucose and a snack at all times ?Rotate injection sites  ?

## 2021-04-27 NOTE — Chronic Care Management (AMB) (Signed)
?Chronic Care Management  ? ? Clinical Social Work Note ? ?04/27/2021 ?Name: James [name] MRN: [id number] DOB: [birth year] ? ?James [name] is a 61 y.o. year old male who is a primary care patient of Olin Hauser, DO. The CCM team was consulted to assist the patient with chronic disease management and/or care coordination needs related to: [employer]  and [employer].  ? ?Engaged with patient by telephone for initial visit in response to provider referral for social work chronic care management and care coordination services.  ? ?Consent to Services:  ?The patient was given the following information about Chronic Care Management services today, agreed to services, and gave verbal consent: 1. CCM service includes personalized support from designated clinical staff supervised by the primary care provider, including individualized plan of care and coordination with other care providers 2. 24/7 contact phone numbers for assistance for urgent and routine care needs. 3. Service will only be billed when office clinical staff spend 20 minutes or more in a month to coordinate care. 4. Only one practitioner may furnish and bill the service in a calendar month. 5.The patient may stop CCM services at any time (effective at the end of the month) by phone call to the office staff. 6. The patient will be responsible for cost sharing (co-pay) of up to 20% of the service fee (after annual deductible is met). Patient agreed to services and consent obtained. ? ?Patient agreed to services and consent obtained.  ? ?Assessment: Review of patient past medical history, allergies, medications, and health status, including review of relevant consultants reports was performed today as part of a comprehensive evaluation and provision of chronic care management and care coordination services.    ? ?SDOH (Social Determinants of Health) assessments and interventions performed:   ? ?Advanced Directives Status: Not  addressed in this encounter. ? ?CCM Care Plan ? ?No Known Allergies ? ?Outpatient Encounter Medications as of 04/27/2021  ?Medication Sig Note  ? atorvastatin (LIPITOR) 40 MG tablet Take 1 tablet (40 mg total) by mouth daily.   ? Cholecalciferol (VITAMIN D3) 125 MCG (5000 UT) CAPS Take 1 capsule (5,000 Units total) by mouth daily with breakfast. Take along with calcium and magnesium.   ? ergocalciferol (VITAMIN D2) 1.25 MG (50000 UT) capsule Take 1 capsule (50,000 Units total) by mouth 2 (two) times a week. X 6 weeks.   ? hydrochlorothiazide (HYDRODIURIL) 25 MG tablet Take 1 tablet (25 mg total) by mouth daily.   ? insulin degludec ([name]) 100 UNIT/ML FlexTouch Pen Inject 25 Units into the skin daily.   ? losartan (COZAAR) 25 MG tablet Take 1 tablet (25 mg total) by mouth daily.   ? metFORMIN (GLUCOPHAGE) 1000 MG tablet Take 1 tablet (1,000 mg total) by mouth daily with breakfast.   ? Oxycodone HCl 10 MG TABS Take 1 tablet (10 mg total) by mouth in the morning and at bedtime. Must last 30 days. 03/29/2021: WARNING: Not a Duplicate. Future prescription. DO NOT DELETE during hospital medication reconciliation or at discharge. ?[facility] Chronic Pain Management Patient   ? OZEMPIC, 0.25 OR 0.5 MG/DOSE, 2 MG/1.5ML SOPN Inject 0.5 mg into the skin once a week.   ? pregabalin (LYRICA) 150 MG capsule Take 2 caps for 371m in AM, and take 1 cap for 1531mat night   ? varenicline (CHANTIX CONTINUING MONTH PAK) 1 MG tablet Take 1 tablet (1 mg total) by mouth 2 (two) times daily.   ? ?No facility-administered encounter medications on file as  of 04/27/2021.  ? ? ?Patient Active Problem List  ? Diagnosis Date Noted  ? Diabetic neuropathy ([place]) 04/26/2021  ? Primary osteoarthritis of hips (Bilateral) 03/29/2021  ? Osteoarthritis of hip (Left) 03/29/2021  ? Osteoarthritis of hip (Right) 03/29/2021  ? Osteoarthritis of knee (Left) 03/29/2021  ? Elevated sed rate 03/29/2021  ? Vitamin D deficiency 03/29/2021  ? Elevated C-reactive protein  (CRP) 03/29/2021  ? Lumbosacral radiculopathy at L5 (Left) 03/29/2021  ? Fall (03/22/2021) 03/29/2021  ? Pharmacologic therapy 02/15/2021  ? Disorder of skeletal system 02/15/2021  ? Problems influencing health status 02/15/2021  ? Chronic use of opiate for therapeutic purpose 02/15/2021  ? Type 2 diabetes mellitus with morbid obesity ([place]) 02/15/2021  ? Chronic lower extremity pain (1ry area of Pain) (Bilateral) 02/15/2021  ? Chronic low back pain (2ry area of Pain) (Bilateral) w/ sciatica (Bilateral) 02/15/2021  ? Chronic shoulder pain (3ry area of Pain) (Left) 02/15/2021  ? Chronic hip pain (Bilateral) 02/15/2021  ? Chronic knee pain (Bilateral) 02/15/2021  ? Weakness of lower extremities (Bilateral) 01/15/2021  ? Morbid obesity with BMI of 50.0-59.9, adult ([place]) 01/15/2021  ? Recurrent falls 01/15/2021  ? Chronic pain syndrome 11/09/2020  ? DDD (degenerative disc disease), lumbar 11/09/2020  ? Primary osteoarthritis involving multiple joints 11/09/2020  ? Diabetes mellitus ([place]) 11/09/2020  ? ? ?Conditions to be addressed/monitored: DMII and Chronic Pain ; Limited access to food and Housing barriers ? ?Care Plan : LCSW Plan of Care  ?Updates made by Rebekah [place]field, LCSW since 04/27/2021 12:00 AM  ?  ? ?Problem: Barriers to Treatment   ?  ? ?Goal: Barriers to Treatment Identified and Managed   ?Start Date: 04/27/2021  ?This Visit's Progress: On track  ?Priority: High  ?Note:   ?Current barriers:   ? Limited access to food and Housing barriers ?Clinical Goals: Patient will work with CCM LCSW to address needs related to psychosocial stressors to improve management of health conditions ?Clinical Interventions:  ?Assessment of needs, barriers , agencies contacted, as well as how impacting care ?Patient reports difficulty managing health conditions triggered by chronic pain that limits ability to cook ?Patient resides with brother. He is interested in wheelchair accessible income-based housing. Pt has completed  application for [facility] and agreed to continue follow up with leasing office ?Pt denies depression and anxiety symptoms. Was successful in identifying healthy coping skills (reading Bible) ?Depression screen reviewed  ?Solution-Focused Strategies employed:  ?Active listening / Reflection utilized  ?Emotional Support Provided ?Verbalization of feelings encouraged  ?Made referral to Care Guide to assist with resources for food insecurity-Pt interested in Meals on Wheels ?Review various resources, discussed options and provided patient information about  ?Community food options  ?Housing resources (Pt has applied for [employer]) ?1:1 collaboration with primary care provider regarding development and update of comprehensive plan of care as evidenced by provider attestation and co-signature ?Inter-disciplinary care team collaboration (see longitudinal plan of care) ?Patient Goals/Self-Care Activities: Over the next 120 days ?Attend all scheduled medical appointments ?Utilize healthy coping skills and/or supportive resources discussed ?Contact PCP office with any questions or concerns ?  ?  ?  ? ?Follow Up Plan: SW will follow up with patient by phone over the next 4-6 weeks ?     ?Christa See, MSW, LCSW ?[facility] Care Management ?[place] [facility] ?[email or web address] ?Phone [phone] ?9:48 AM ? ? ? ?

## 2021-04-28 ENCOUNTER — Encounter: Payer: Self-pay | Admitting: Gastroenterology

## 2021-04-28 NOTE — Progress Notes (Signed)
Patient: James [name]  Service Category: E/M  Provider: Gaspar Cola, MD  ?DOB: 1960/01/28  DOS: 04/29/2021  Location: Office  ?MRN: [id number]  Setting: Ambulatory outpatient  Referring Provider: Nobie [name] *  ?Type: Established Patient  Specialty: Interventional Pain Management  PCP: Olin Hauser, DO  ?Location: Remote location  Delivery: TeleHealth    ? ?Virtual Encounter - Pain Management ?PROVIDER NOTE: Information contained herein reflects review and annotations entered in association with encounter. Interpretation of such information and data should be left to medically-trained personnel. Information provided to patient can be located elsewhere in the medical record under "Patient Instructions". Document created using STT-dictation technology, any transcriptional errors that may result from process are unintentional.  ?  ?Contact & Pharmacy ?Preferred: [phone] ?Home: [phone] (home) ?Mobile: [phone] (mobile) ?E-mail: No e-mail address on record  ?[place] (N), [place] [facility] [place] ?[address] ([place]) [place] [address] ?Phone: [phone] Fax: [phone] ?  ?Pre-screening  ?James [name] offered "in-person" vs "virtual" encounter. He indicated preferring virtual for this encounter.  ? ?Reason ?COVID-19*  Social distancing based on CDC and AMA recommendations.  ? ?I contacted James [name] on 04/29/2021 via telephone.      I clearly identified myself as Gaspar Cola, MD. I verified that I was speaking with the correct person using two identifiers (Name: James [name], and date of birth: 1955/06/17). ? ?Consent ?I sought verbal advanced consent from James [name] for virtual visit interactions. I informed James [name] of possible security and privacy concerns, risks, and limitations associated with providing "not-in-person" medical evaluation and management services. I also informed Mr.  [name] of the availability of "in-person" appointments. Finally, I informed him that there would be a charge for the virtual visit and that he could be  personally, fully or partially, financially responsible for it. James [name] expressed understanding and agreed to proceed.  ? ?Historic Elements   ?James [name] is a 61 y.o. year old, male patient evaluated today after our last contact on 03/29/2021. James [name]  has a past medical history of Arthritis, Diabetes mellitus without complication ([place]), Hyperlipidemia, and Hypertension. He also  has a past surgical history that includes Colonoscopy with propofol; Appendectomy; and Colonoscopy with propofol (N/A, 04/23/2021). James [name] has a current medication list which includes the following prescription(s): atorvastatin, vitamin d3, ergocalciferol, hydrochlorothiazide, insulin degludec, losartan, metformin, oxycodone hcl, ozempic (0.25 or 0.5 mg/dose), pregabalin, and varenicline. He  reports that he has been smoking cigarettes. He has a 2.50 pack-year smoking history. He has never used smokeless tobacco. He reports that he does not drink alcohol and does not use drugs. James [name] has No Known Allergies.  ? ?HPI  ?Today, he is being contacted for follow-up evaluation and medication management.  On 03/29/2021 we had determined that we would be taking over the patient's medications and a prescription for oxycodone IR 10 mg (# 60) [facility] [place].  The start date was 03/29/2021 and the end date 04/28/2021.  On 03/29/2021 the patient signed a medication agreement with [place].  To my surprise, the patient had failed to disclose that he had filled and oxycodone/APAP 10/325 (# 11) prescription from James [name].  He has yet to fill the prescription that we gave him.  ? ?Today we had a long discussion about his medications and he insisted that he needed a refill.  I confronted the patient with the fact that we can see the report from  the PMP and it stated that the day before his last appointment he had received a prescription from James [name].  He kept insisting that the prescription that he had received was from me and that he had already taken the medicine.  He even went as far as telling me that he had the bottle with my name on it indicating that I had prescribed it.  I informed the patient that if that was the case, he would need to go to the pharmacy to have the pharmacist asked this the PMP and correct information that they had entered.  To clarify this, I asked him to go get that bottle and when he did, he realized that the medication that he had received was from James [name] and not mine.  I informed the patient that if that was the case, the information on the PMP was absolutely correct as we had suspected and that he should have [facility].  I also reminded him that the prescription should last for 30 days and assuming that he had it filled today, this would mean that it should last until 05/29/2021.  Because of this, he should get a return appointment to see me on or before that date. ? ?Today I also went over all of the results from the test that we had ordered which included his left shoulder x-rays, lumbar spine x-rays with flexion and extension views, and the MRIs of both hips.  The results of all these tests were explained to the patient in layman's terms.  I informed him that he needs to actively work on losing weight since most of the pathology observed is related to osteoarthritis which in turn is directly associated with his excess weight.  He indicated that he is currently working with a nutritionist.  Because the test that we have done did not clearly explained the L5 radiculopathy that he seems to be experiencing, I went ahead and ordered today an MRI of the lumbar spine.  He refers that his current situation with his lower extremities and the pain are such that he is  unable to walk without any assistance.  He uses either 2 canes, a wheelchair, or a walker to be able to ambulate. ? ?RTCB: 05/29/2021 ? ?Pharmacotherapy Assessment  ? ?Opioid Analgesic: Oxycodone/APAP 10/325 (# 60/month), 1 tab p.o. twice daily (last filled on 02/03/2021) Needs to loose an average of 2.75 lbs/[name] to be considered for opioid pharmacotherapy.  ?MME/day: 30 mg/day  ? ?Monitoring: ?[place] PMP: PDMP reviewed during this encounter.       ?Pharmacotherapy: No side-effects or adverse reactions reported. ?Compliance: No problems identified. ?Effectiveness: Clinically acceptable. ?Plan: Refer to "POC". UDS:  ?Summary  ?Date Value Ref Range Status  ?02/15/2021 Note  Final  ?  Comment:  ?  ==================================================================== ?Compliance Drug Analysis, Ur ?==================================================================== ?Test                             Result       Flag       Units ? ?Drug Present and Declared for Prescription Verification ?  Oxycodone                      3020         EXPECTED   ng/mg creat ?  Oxymorphone  1094         EXPECTED   ng/mg creat ?  Noroxycodone                   3969         EXPECTED   ng/mg creat ?  Noroxymorphone                 265          EXPECTED   ng/mg creat ?   Sources of oxycodone are scheduled prescription medications. ?   Oxymorphone, noroxycodone, and noroxymorphone are expected ?   metabolites of oxycodone. Oxymorphone is also available as a ?   scheduled prescription medication. ? ?  Pregabalin                     PRESENT      EXPECTED ?  Acetaminophen                  PRESENT      EXPECTED ?==================================================================== ?Test                      Result    Flag   Units      Ref Range ?  Creatinine              172              mg/dL      >=20 ?==================================================================== ?Declared Medications: ? The flagging and interpretation on this  report are based on the ? following declared medications.  Unexpected results may arise from ? inaccuracies in the declared medications. ? ? **Note: The testing scope of this panel includes these medications

## 2021-04-28 NOTE — Progress Notes (Signed)
Diabetes Self-Management Education ? ?Visit Type: Follow-up ? ?Appt. Start Time: 1255 Appt. End Time: 1350 ? ?04/27/2021 ? ?[name], identified by name and date of birth, is a 61 y.o. male with a diagnosis of Diabetes: Type 2.  ? ?ASSESSMENT ? ?Blood pressure 128/80, weight (!) 348 lb 3.2 oz (157.9 kg). ?Body mass index is 47.22 kg/m?. ? ? Diabetes Self-Management Education - 04/27/21 1316   ? ?  ? Visit Information  ? Visit Type Follow-up   ?  ? Initial Visit  ? Diabetes Type Type 2   ?  ? Complications  ? How often do you check your blood sugar? 1-2 times/day   ? Fasting Blood glucose range (mg/dL) 70-129   Pt reports FBG's less than 130's mg/dL.  ? Postprandial Blood glucose range (mg/dL) 70-129   Pt reports pp's 120's mg/dL  ? Number of hypoglycemic episodes per month 1   Pt reports his BG was 101 mg/dL and he had symptoms. He treated with 3 glucose tablets.  ? Can you tell when your blood sugar is low? Yes   ? What do you do if your blood sugar is low? glucose tablets   ? Have you had a dilated eye exam in the past 12 months? Yes   appt 4/28  ? Have you had a dental exam in the past 12 months? Yes   ? Are you checking your feet? Yes   he saw podiatrist yesterday and will be receiving diabetic shoes in May  ? How many days per week are you checking your feet? 6   ?  ? Dietary Intake  ? Breakfast only eats 1-2 meals/day   ? Snack (morning) reports 1 snacks/day   ?  ? Exercise  ? Exercise Type ADL's;Other (comment)   chair exercises  ? How many days per week to you exercise? 7   ? How many minutes per day do you exercise? 3.5   ? Total minutes per week of exercise 24.5   ?  ? Patient Education  ? Disease state  Explored patient's options for treatment of their diabetes   ? Nutrition management  Role of diet in the treatment of diabetes and the relationship between the three main macronutrients and blood glucose level;Food label reading, portion sizes and measuring food.;Carbohydrate counting;Reviewed  blood glucose goals for pre and post meals and how to evaluate the patients' food intake on their blood glucose level.;Meal timing in regards to the patients' current diabetes medication.   ? Physical activity and exercise  Role of exercise on diabetes management, blood pressure control and cardiac health.   ? Medications Taught/reviewed insulin injection, site rotation, insulin storage and needle disposal.;Reviewed patients medication for diabetes, action, purpose, timing of dose and side effects.   ? Monitoring Purpose and frequency of SMBG.;Taught/discussed recording of test results and interpretation of SMBG.;Identified appropriate SMBG and/or A1C goals.   ? Acute complications Taught treatment of hypoglycemia - the 15 rule.   ? Chronic complications Relationship between chronic complications and blood glucose control   ? Psychosocial adjustment Identified and addressed patients feelings and concerns about diabetes   ? Personal strategies to promote health Review risk of smoking and offered smoking cessation   ?  ? Post-Education Assessment  ? Patient understands the diabetes disease and treatment process. Demonstrates understanding / competency   ? Patient understands incorporating nutritional management into lifestyle. Needs Review   ? Patient undertands incorporating physical activity into lifestyle. Needs Review   ?  Patient understands using medications safely. Demonstrates understanding / competency   ? Patient understands monitoring blood glucose, interpreting and using results Demonstrates understanding / competency   ? Patient understands prevention, detection, and treatment of acute complications. Demonstrates understanding / competency   ? Patient understands prevention, detection, and treatment of chronic complications. Demonstrates understanding / competency   ? Patient understands how to develop strategies to address psychosocial issues. Demonstrates understanding / competency   ? Patient  understands how to develop strategies to promote health/change behavior. Demonstrates understanding / competency   ?  ? Outcomes  ? Program Status Completed   ?  ? Subsequent Visit  ? Since your last visit have you continued or begun to take your medications as prescribed? Yes   ? Since your last visit have you had your blood pressure checked? Yes   ? Is your most recent blood pressure lower, unchanged, or higher since your last visit? Higher   ? Since your last visit have you experienced any weight changes? Gain   ? Weight Gain (lbs) 14   ? Since your last visit, are you checking your blood glucose at least once a day? Yes   ? ?  ?  ?Individualized Plan for Diabetes Self-Management Training:  ? ?Learning Objective:  Patient will have a greater understanding of diabetes self-management. ?Patient education plan is to attend individual and/or group sessions per assessed needs and concerns. ?  ?Plan:  ? ?Patient Instructions  ?Check blood sugars before breakfast or 2 hrs after one meal every day ? ?Exercise:  Chair exercise or walk as tolerated ? ?Eat 3 meals day,   1-2  snacks a day ?Space meals 4-6 hours apart ?Don't skip meals - eat at least 1 protein and 1 carbohydrate serving ?Continue to limit desserts/sweets ? ?Quit smoking ? ?Carry fast acting glucose and a snack at all times ?Rotate injection sites  ? ?Expected Outcomes:   ?Demonstrated interest in learning. Expect positive outcomes ? ?Education material provided:  ?Planning a Balanced Meal ?Quick and Balanced Meals ?Healthy Snack Choices (ADA) ?Community Guide to Assistance [employer] on Wheels #) ? ?If problems or questions, patient to contact team via:   ?Johny Drilling, RN, [place], [place] [phone] ? ?Future DSME appointment:  ?PRN ?

## 2021-04-29 ENCOUNTER — Ambulatory Visit: Payer: Self-pay | Admitting: Licensed Clinical Social Worker

## 2021-04-29 ENCOUNTER — Encounter: Payer: Self-pay | Admitting: Pain Medicine

## 2021-04-29 ENCOUNTER — Ambulatory Visit: Payer: Medicare Other | Attending: Pain Medicine | Admitting: Pain Medicine

## 2021-04-29 DIAGNOSIS — M5417 Radiculopathy, lumbosacral region: Secondary | ICD-10-CM | POA: Diagnosis not present

## 2021-04-29 DIAGNOSIS — G894 Chronic pain syndrome: Secondary | ICD-10-CM

## 2021-04-29 DIAGNOSIS — Z79891 Long term (current) use of opiate analgesic: Secondary | ICD-10-CM

## 2021-04-29 DIAGNOSIS — M25562 Pain in left knee: Secondary | ICD-10-CM

## 2021-04-29 DIAGNOSIS — M79604 Pain in right leg: Secondary | ICD-10-CM

## 2021-04-29 DIAGNOSIS — M47816 Spondylosis without myelopathy or radiculopathy, lumbar region: Secondary | ICD-10-CM | POA: Diagnosis not present

## 2021-04-29 DIAGNOSIS — M160 Bilateral primary osteoarthritis of hip: Secondary | ICD-10-CM | POA: Diagnosis not present

## 2021-04-29 DIAGNOSIS — E1142 Type 2 diabetes mellitus with diabetic polyneuropathy: Secondary | ICD-10-CM

## 2021-04-29 DIAGNOSIS — M25561 Pain in right knee: Secondary | ICD-10-CM

## 2021-04-29 DIAGNOSIS — M5442 Lumbago with sciatica, left side: Secondary | ICD-10-CM | POA: Diagnosis not present

## 2021-04-29 DIAGNOSIS — M19012 Primary osteoarthritis, left shoulder: Secondary | ICD-10-CM | POA: Diagnosis not present

## 2021-04-29 DIAGNOSIS — M12812 Other specific arthropathies, not elsewhere classified, left shoulder: Secondary | ICD-10-CM

## 2021-04-29 DIAGNOSIS — M25512 Pain in left shoulder: Secondary | ICD-10-CM | POA: Diagnosis not present

## 2021-04-29 DIAGNOSIS — Z79899 Other long term (current) drug therapy: Secondary | ICD-10-CM

## 2021-04-29 DIAGNOSIS — M47817 Spondylosis without myelopathy or radiculopathy, lumbosacral region: Secondary | ICD-10-CM | POA: Diagnosis not present

## 2021-04-29 DIAGNOSIS — G608 Other hereditary and idiopathic neuropathies: Secondary | ICD-10-CM | POA: Insufficient documentation

## 2021-04-29 DIAGNOSIS — R935 Abnormal findings on diagnostic imaging of other abdominal regions, including retroperitoneum: Secondary | ICD-10-CM | POA: Insufficient documentation

## 2021-04-29 DIAGNOSIS — M25552 Pain in left hip: Secondary | ICD-10-CM

## 2021-04-29 DIAGNOSIS — M159 Polyosteoarthritis, unspecified: Secondary | ICD-10-CM

## 2021-04-29 DIAGNOSIS — Z6841 Body Mass Index (BMI) 40.0 and over, adult: Secondary | ICD-10-CM

## 2021-04-29 DIAGNOSIS — M170 Bilateral primary osteoarthritis of knee: Secondary | ICD-10-CM

## 2021-04-29 DIAGNOSIS — M79605 Pain in left leg: Secondary | ICD-10-CM

## 2021-04-29 DIAGNOSIS — M5441 Lumbago with sciatica, right side: Secondary | ICD-10-CM

## 2021-04-29 DIAGNOSIS — M25551 Pain in right hip: Secondary | ICD-10-CM

## 2021-04-29 DIAGNOSIS — G8929 Other chronic pain: Secondary | ICD-10-CM

## 2021-04-29 DIAGNOSIS — R94131 Abnormal electromyogram [EMG]: Secondary | ICD-10-CM | POA: Insufficient documentation

## 2021-04-30 ENCOUNTER — Telehealth: Payer: Self-pay | Admitting: *Deleted

## 2021-04-30 NOTE — Anesthesia Postprocedure Evaluation (Signed)
Anesthesia Post Note ? ?Patient: James [name] ? ?Procedure(s) Performed: COLONOSCOPY WITH PROPOFOL ? ?Patient location during evaluation: PACU ?Anesthesia Type: General ?Level of consciousness: awake and oriented ?Pain management: pain level controlled ?Vital Signs Assessment: post-procedure vital signs reviewed and stable ?Respiratory status: spontaneous breathing and respiratory function stable ?Cardiovascular status: stable ?Anesthetic complications: no ? ? ?No notable events documented. ? ? ?Last Vitals:  ?Vitals:  ? 04/23/21 1424 04/23/21 1433  ?BP: (!) 147/86 (!) 150/90  ?Pulse: 82 88  ?Resp: (!) 21 20  ?Temp:    ?SpO2: 99%   ?  ?Last Pain:  ?Vitals:  ? 04/23/21 1418  ?TempSrc: Temporal  ?PainSc: 8   ? ? ?  ?  ?  ?  ?  ?  ? ?VAN STAVEREN,Vallory Oetken ? ? ? ? ?

## 2021-05-03 NOTE — Patient Instructions (Signed)
Visit Information ? ?Thank you for taking time to visit with me today. Please don't hesitate to contact me if I can be of assistance to you before our next scheduled telephone appointment. ? ?Following are the goals we discussed today:  ?Patient Goals/Self-Care Activities: Over the next 120 days ?Attend all scheduled medical appointments ?Utilize healthy coping skills and/or supportive resources discussed ?Contact PCP office with any questions or concerns ? ?Our next appointment is by telephone on 06/08/21 at 10:45 AM ? ?Please call the care guide team at [phone] if you need to cancel or reschedule your appointment.  ? ?If you are experiencing a Mental Health or [place] or need someone to talk to, please call the Suicide and Crisis Lifeline: 988  ? ?The patient verbalized understanding of instructions, educational materials, and care plan provided today and declined offer to receive copy of patient instructions, educational materials, and care plan.  ? ?Christa See, MSW, LCSW ?[facility] Care Management ?[facility] ?[email or web address] ?Phone [phone] ?6:37 AM ? ?

## 2021-05-03 NOTE — Telephone Encounter (Signed)
Close encounter 

## 2021-05-03 NOTE — Chronic Care Management (AMB) (Signed)
?Chronic Care Management  ? ? Clinical Social Work Note ? ?05/03/2021 ?Name: James [name] MRN: [id number] DOB: May 17, 1960 ? ?Sender James [name] is a 61 y.o. year old male who is a primary care patient of James Hauser, DO. The CCM team was consulted to assist the patient with chronic disease management and/or care coordination needs related to: [employer] .  ? ?Engaged with patient by telephone for follow up visit in response to provider referral for social work chronic care management and care coordination services.  ? ?Consent to Services:  ?The patient was given information about Chronic Care Management services, agreed to services, and gave verbal consent prior to initiation of services.  Please see initial visit note for detailed documentation.  ? ?Patient agreed to services and consent obtained.  ? ?Assessment: Review of patient past medical history, allergies, medications, and health status, including review of relevant consultants reports was performed today as part of a comprehensive evaluation and provision of chronic care management and care coordination services.    ? ?SDOH (Social Determinants of Health) assessments and interventions performed:   ? ?Advanced Directives Status: Not addressed in this encounter. ? ?CCM Care Plan ? ?No Known Allergies ? ?Outpatient Encounter Medications as of 04/29/2021  ?Medication Sig Note  ? atorvastatin (LIPITOR) 40 MG tablet Take 1 tablet (40 mg total) by mouth daily.   ? Cholecalciferol (VITAMIN D3) 125 MCG (5000 UT) CAPS Take 1 capsule (5,000 Units total) by mouth daily with breakfast. Take along with calcium and magnesium.   ? ergocalciferol (VITAMIN D2) 1.25 MG (50000 UT) capsule Take 1 capsule (50,000 Units total) by mouth 2 (two) times a week. X 6 weeks.   ? hydrochlorothiazide (HYDRODIURIL) 25 MG tablet Take 1 tablet (25 mg total) by mouth daily.   ? insulin degludec ([name]) 100 UNIT/ML FlexTouch Pen Inject 25 Units into the skin daily.  (Patient taking differently: Inject 20 Units into the skin daily.)   ? losartan (COZAAR) 25 MG tablet Take 1 tablet (25 mg total) by mouth daily.   ? metFORMIN (GLUCOPHAGE) 1000 MG tablet Take 1 tablet (1,000 mg total) by mouth daily with breakfast.   ? Oxycodone HCl 10 MG TABS Take 1 tablet (10 mg total) by mouth in the morning and at bedtime. Must last 30 days. 03/29/2021: WARNING: Not a Duplicate. Future prescription. DO NOT DELETE during hospital medication reconciliation or at discharge. ?[facility] Chronic Pain Management Patient   ? OZEMPIC, 0.25 OR 0.5 MG/DOSE, 2 MG/1.5ML SOPN Inject 0.5 mg into the skin once a week.   ? pregabalin (LYRICA) 150 MG capsule Take 2 caps for '300mg'$  in AM, and take 1 cap for '150mg'$  at night   ? varenicline (CHANTIX CONTINUING MONTH PAK) 1 MG tablet Take 1 tablet (1 mg total) by mouth 2 (two) times daily.   ? ?No facility-administered encounter medications on file as of 04/29/2021.  ? ? ?Patient Active Problem List  ? Diagnosis Date Noted  ? Osteoarthritis of AC (acromioclavicular) joint (Left) 04/29/2021  ? Osteoarthritis of shoulder (Left) 04/29/2021  ? Rotator cuff arthropathy of shoulder (Left) 04/29/2021  ? Lumbosacral facet hypertrophy (L4-5, L5-S1) 04/29/2021  ? Lumbar facet syndrome 04/29/2021  ? Abnormal MRI, hip (04/21/2021) 04/29/2021  ? 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 Abnormal EMG (electromyogram) (03/16/2021) 04/29/2021  ? Polyneuropathy, 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  ? Diabetic sensorimotor polyneuropathy ([place]) 04/29/2021  ? Diabetic neuropathy ([place]) 04/26/2021  ? Osteoarthritis of hips (Bilateral) 03/29/2021  ? Osteoarthritis of hip (Left)  03/29/2021  ? Osteoarthritis of hip (Right) 03/29/2021  ? Osteoarthritis of knee (Left) 03/29/2021  ? Elevated sed rate 03/29/2021  ? Vitamin D deficiency 03/29/2021  ? Elevated C-reactive protein (CRP) 03/29/2021  ? Lumbosacral radiculopathy at L5 (Left) 03/29/2021  ? Fall (03/22/2021) 03/29/2021  ?  Pharmacologic therapy 02/15/2021  ? Disorder of skeletal system 02/15/2021  ? Problems influencing health status 02/15/2021  ? Chronic use of opiate for therapeutic purpose 02/15/2021  ? Type 2 diabetes mellitus with morbid obesity ([place]) 02/15/2021  ? Chronic lower extremity pain (1ry area of Pain) (Bilateral) 02/15/2021  ? Chronic low back pain (4th area of Pain) (Bilateral) w/ sciatica (Bilateral) 02/15/2021  ? Chronic shoulder pain (5th area of Pain) (Left) 02/15/2021  ? Chronic hip pain (2ry area of Pain) (Bilateral) 02/15/2021  ? Chronic knee pain (3ry area of Pain) (Bilateral) 02/15/2021  ? Weakness of lower extremities (Bilateral) 01/15/2021  ? Morbid obesity with BMI of 45.0-49.9, adult ([place]) 01/15/2021  ? Recurrent falls 01/15/2021  ? Chronic pain syndrome 11/09/2020  ? DDD (degenerative disc disease), lumbar 11/09/2020  ? Primary osteoarthritis involving multiple joints 11/09/2020  ? Diabetes mellitus ([place]) 11/09/2020  ? ? ?Conditions to be addressed/monitored: DMII, Osteoarthritis, and Chronic Pain ? ?Care Plan : LCSW Plan of Care  ?Updates made by James Chesterfield, LCSW since 05/03/2021 12:00 AM  ?  ? ?Problem: Barriers to Treatment   ?  ? ?Goal: Barriers to Treatment Identified and Managed   ?Start Date: 04/27/2021  ?Expected End Date: 07/16/2021  ?This Visit's Progress: On track  ?Recent Progress: On track  ?Priority: High  ?Note:   ?Current barriers:   ? Limited access to food and Housing barriers ?Clinical Goals: Patient will work with CCM LCSW to address needs related to psychosocial stressors to improve management of health conditions ?Clinical Interventions:  ?Assessment of needs, barriers , agencies contacted, as well as how impacting care ?Patient reports difficulty managing health conditions triggered by chronic pain that limits ability to cook. Pt was successfully referred to a food delivery service for approx 6 weeks ?Patient resides with brother. He is interested in wheelchair accessible  income-based housing. Pt has completed application for [facility] and agreed to continue follow up with leasing office Patient informed he is #7 on wait-list. Pt is expecting to have an unit by [phone]  ?Pt denies depression and anxiety symptoms. Was successful in identifying healthy coping skills (reading Bible) ?Depression screen reviewed  ?Solution-Focused Strategies employed:  ?Active listening / Reflection utilized  ?Emotional Support Provided ?Verbalization of feelings encouraged  ?Made referral to Care Guide to assist with resources for food insecurity-Pt interested in Meals on Wheels ?Review various resources, discussed options and provided patient information about  ?Community food options  ?Housing resources (Pt has applied for [employer]) ?1:1 collaboration with primary care provider regarding development and update of comprehensive plan of care as evidenced by provider attestation and co-signature ?Inter-disciplinary care team collaboration (see longitudinal plan of care) ?Patient Goals/Self-Care Activities: Over the next 120 days ?Attend all scheduled medical appointments ?Utilize healthy coping skills and/or supportive resources discussed ?Contact PCP office with any questions or concerns ?  ?  ?  ? ?Follow Up Plan: SW will follow up with patient by phone over the next 6-8 weeks ?     ?Christa See, MSW, LCSW ?[facility] Care Management ?[facility] [place] [facility] ?[email or web address] ?Phone [phone] ?6:36 AM ? ? ? ?

## 2021-05-11 ENCOUNTER — Other Ambulatory Visit: Payer: Medicare Other

## 2021-05-13 ENCOUNTER — Other Ambulatory Visit: Payer: Medicare Other

## 2021-05-15 DIAGNOSIS — G894 Chronic pain syndrome: Secondary | ICD-10-CM | POA: Diagnosis not present

## 2021-05-15 DIAGNOSIS — M5136 Other intervertebral disc degeneration, lumbar region: Secondary | ICD-10-CM | POA: Diagnosis not present

## 2021-05-15 DIAGNOSIS — R296 Repeated falls: Secondary | ICD-10-CM | POA: Diagnosis not present

## 2021-05-15 DIAGNOSIS — M150 Primary generalized (osteo)arthritis: Secondary | ICD-10-CM | POA: Diagnosis not present

## 2021-05-16 DIAGNOSIS — M159 Polyosteoarthritis, unspecified: Secondary | ICD-10-CM

## 2021-05-16 DIAGNOSIS — E1169 Type 2 diabetes mellitus with other specified complication: Secondary | ICD-10-CM

## 2021-05-18 ENCOUNTER — Other Ambulatory Visit: Payer: Self-pay | Admitting: Family Medicine

## 2021-05-18 DIAGNOSIS — I10 Essential (primary) hypertension: Secondary | ICD-10-CM

## 2021-05-19 ENCOUNTER — Ambulatory Visit: Payer: Medicare Other | Admitting: Pain Medicine

## 2021-05-19 NOTE — Telephone Encounter (Signed)
Change in pharmacy- remainder of Rx sent to new pharmacy ?Requested Prescriptions  ?Pending Prescriptions Disp Refills  ?? hydrochlorothiazide (HYDRODIURIL) 25 MG tablet [Pharmacy Med Name: hydroCHLOROthiazide 25 MG Oral Tablet] 90 tablet 3  ?  Sig: TAKE 1 TABLET BY MOUTH DAILY  ?  ? Cardiovascular: Diuretics - Thiazide Failed - 05/18/2021  1:02 PM  ?  ?  Failed - Cr in normal range and within 180 days  ?  Creatinine, Ser  ?Date Value Ref Range Status  ?02/15/2021 0.70 (L) 0.76 - 1.27 mg/dL Final  ?   ?  ?  Passed - K in normal range and within 180 days  ?  Potassium  ?Date Value Ref Range Status  ?02/15/2021 4.7 3.5 - 5.2 mmol/L Final  ?   ?  ?  Passed - Na in normal range and within 180 days  ?  Sodium  ?Date Value Ref Range Status  ?02/15/2021 137 134 - 144 mmol/L Final  ?   ?  ?  Passed - Last BP in normal range  ?  BP Readings from Last 1 Encounters:  ?04/27/21 128/80  ?   ?  ?  Passed - Valid encounter within last 6 months  ?  Recent Outpatient Visits   ?      ? 2 months ago Pain and swelling of toe of left foot  ? [place], DO  ? 4 months ago DDD (degenerative disc disease), lumbar  ? [place], DO  ? 4 months ago Chronic pain syndrome  ? [place], DO  ? 5 months ago Type 2 diabetes mellitus with other specified complication, with long-term current use of insulin ([place])  ? [place], DO  ? 6 months ago Chronic pain syndrome  ? [place], DO  ?  ?  ?Future Appointments   ?        ? In 1 month Karamalegos, Devonne Doughty, DO [facility], [place]  ?  ? ?  ?  ?  ? ?

## 2021-05-21 ENCOUNTER — Ambulatory Visit
Admission: RE | Admit: 2021-05-21 | Discharge: 2021-05-21 | Disposition: A | Discharge status: Home / Self Care | Payer: No Typology Code available for payment source | Source: Ambulatory Visit | Attending: Pain Medicine | Admitting: Pain Medicine

## 2021-05-21 ENCOUNTER — Ambulatory Visit: Payer: No Typology Code available for payment source

## 2021-05-21 DIAGNOSIS — G8929 Other chronic pain: Secondary | ICD-10-CM | POA: Diagnosis present

## 2021-05-21 DIAGNOSIS — M5442 Lumbago with sciatica, left side: Secondary | ICD-10-CM | POA: Insufficient documentation

## 2021-05-21 DIAGNOSIS — M5441 Lumbago with sciatica, right side: Secondary | ICD-10-CM | POA: Insufficient documentation

## 2021-05-21 DIAGNOSIS — M5136 Other intervertebral disc degeneration, lumbar region: Secondary | ICD-10-CM | POA: Diagnosis not present

## 2021-05-21 DIAGNOSIS — E1142 Type 2 diabetes mellitus with diabetic polyneuropathy: Secondary | ICD-10-CM

## 2021-05-21 DIAGNOSIS — M25552 Pain in left hip: Secondary | ICD-10-CM | POA: Insufficient documentation

## 2021-05-21 DIAGNOSIS — M25551 Pain in right hip: Secondary | ICD-10-CM | POA: Insufficient documentation

## 2021-05-21 DIAGNOSIS — M79605 Pain in left leg: Secondary | ICD-10-CM | POA: Diagnosis present

## 2021-05-21 DIAGNOSIS — M47817 Spondylosis without myelopathy or radiculopathy, lumbosacral region: Secondary | ICD-10-CM | POA: Diagnosis present

## 2021-05-21 DIAGNOSIS — M5417 Radiculopathy, lumbosacral region: Secondary | ICD-10-CM | POA: Insufficient documentation

## 2021-05-21 DIAGNOSIS — M79604 Pain in right leg: Secondary | ICD-10-CM | POA: Insufficient documentation

## 2021-05-21 DIAGNOSIS — M47816 Spondylosis without myelopathy or radiculopathy, lumbar region: Secondary | ICD-10-CM | POA: Diagnosis present

## 2021-05-21 DIAGNOSIS — M545 Low back pain, unspecified: Secondary | ICD-10-CM | POA: Diagnosis not present

## 2021-05-21 IMAGING — MR MR LUMBAR SPINE W/O CM
5 series · 33 of 48 positions shown · non-contrast
Comparison: X-ray lumbar [DATE].

CLINICAL DATA: Chronic low back with bilateral hip pain.

EXAM:
MRI LUMBAR SPINE WITHOUT CONTRAST
TECHNIQUE: Multiplanar, multisequence MR imaging of the lumbar spine was
performed. No intravenous contrast was administered.

[Series 5: T2 · sagittal · 4.0mm · 1.02mm/px · 6 of 15 slices shown (1 of 2)]
[im 1/15]
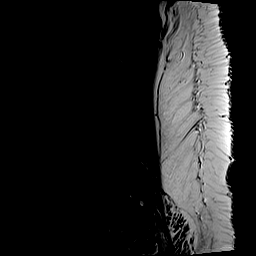
[im 3/15]
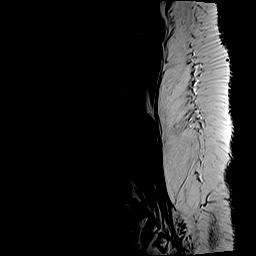
[im 6/15]
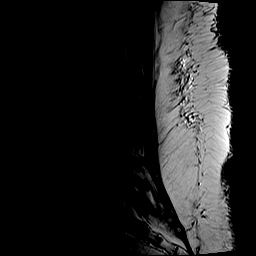
[im 9/15]
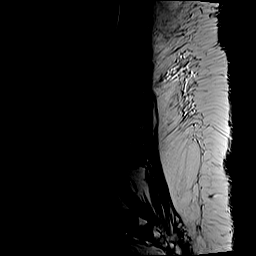
[im 12/15]
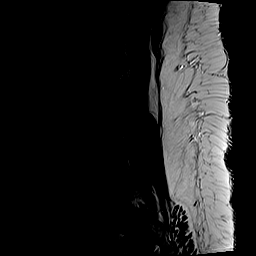
[im 15/15]
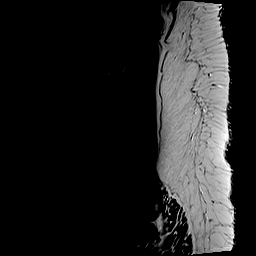

[Series 6: T1 · sagittal · 4.0mm · 1.02mm/px · 6 of 15 slices shown (1 of 2)]
[im 1/15]
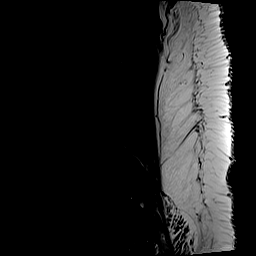
[im 3/15]
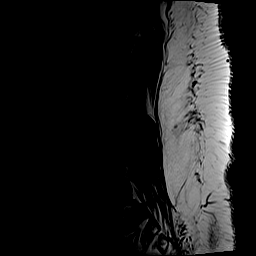
[im 6/15]
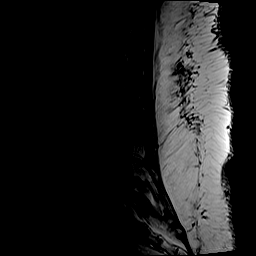
[im 9/15]
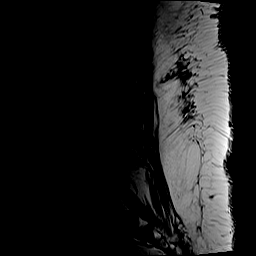
[im 12/15]
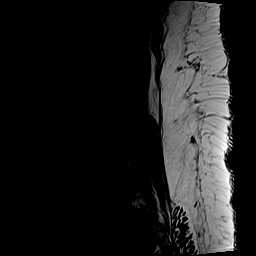
[im 15/15]
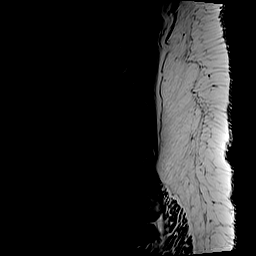

[Series 7: STIR · sagittal · 4.0mm · 0.51mm/px · 3 of 15 slices shown]
[im 1/15]
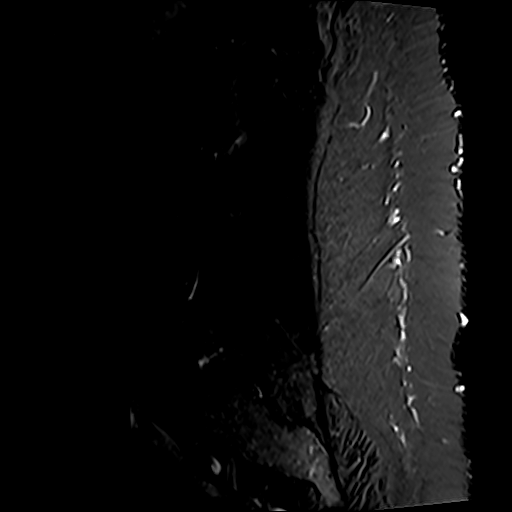
[im 3/15]
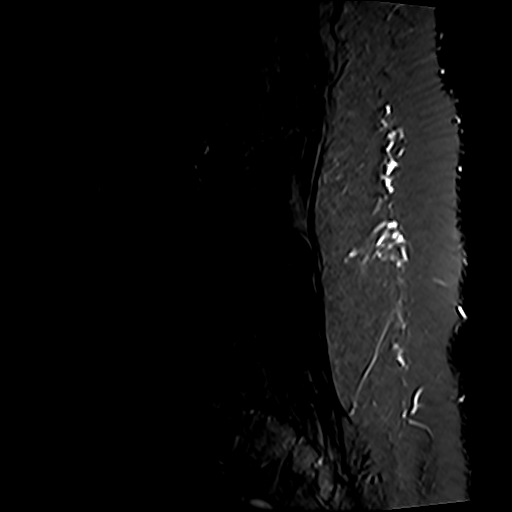
[im 6/15]
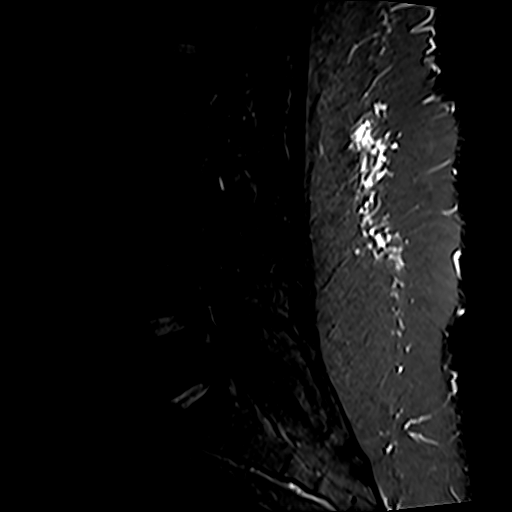

[Series 8: T2 · axial · 4.0mm · 0.78mm/px · z∈[-109,+119]mm · 9 of 40 slices shown (2 of 2)]
[im 1/40]
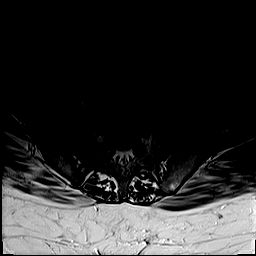
[im 6/40]
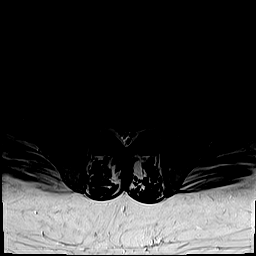
[im 12/40]
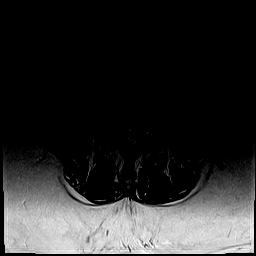
[im 17/40]
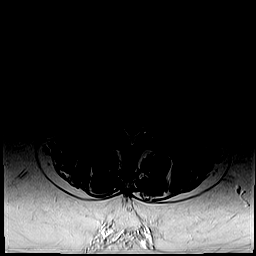
[im 20/40]
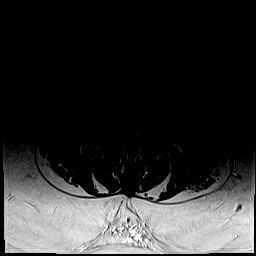
[im 23/40]
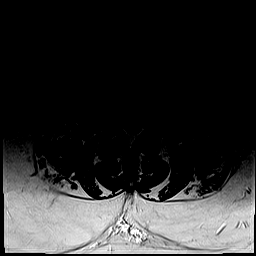
[im 28/40]
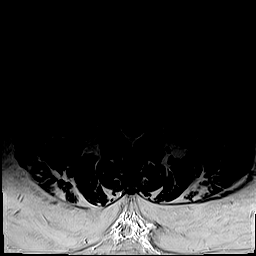
[im 34/40]
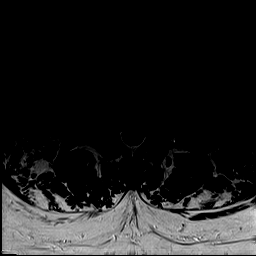
[im 40/40]
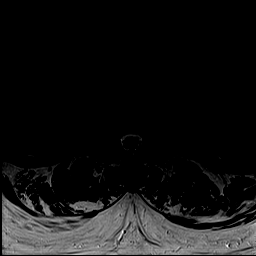

[Series 9: T1 · axial · 4.0mm · 0.39mm/px · z∈[-109,+119]mm · 9 of 40 slices shown (2 of 2)]
[im 1/40]
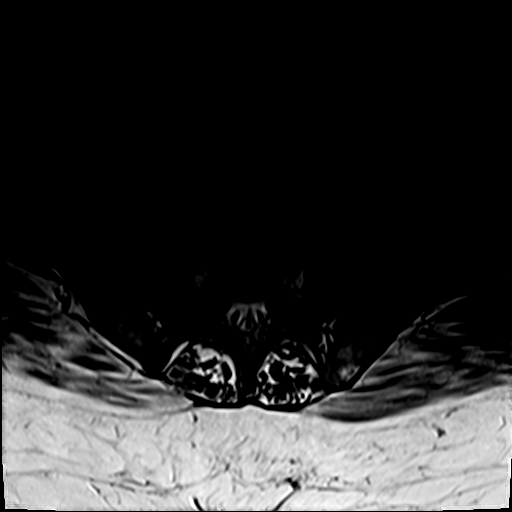
[im 6/40]
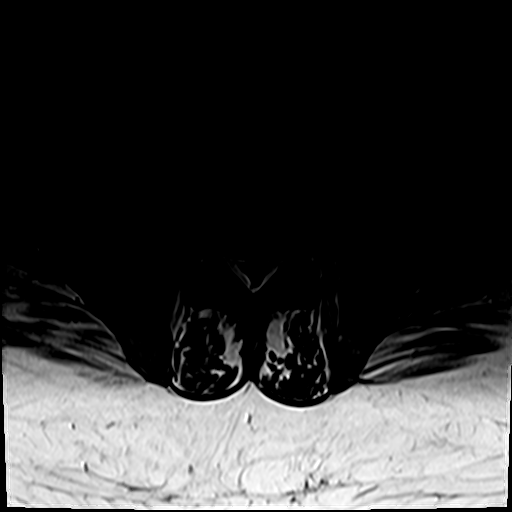
[im 12/40]
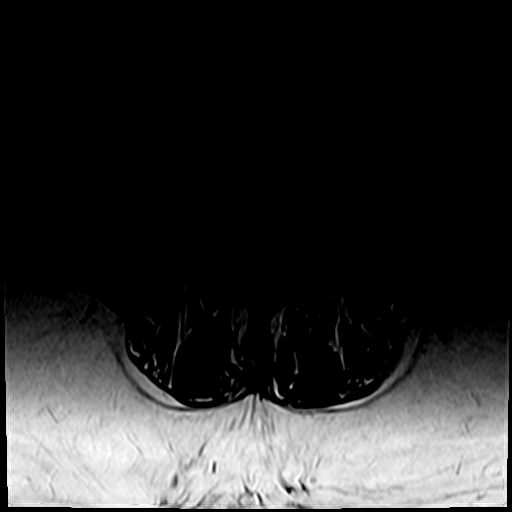
[im 17/40]
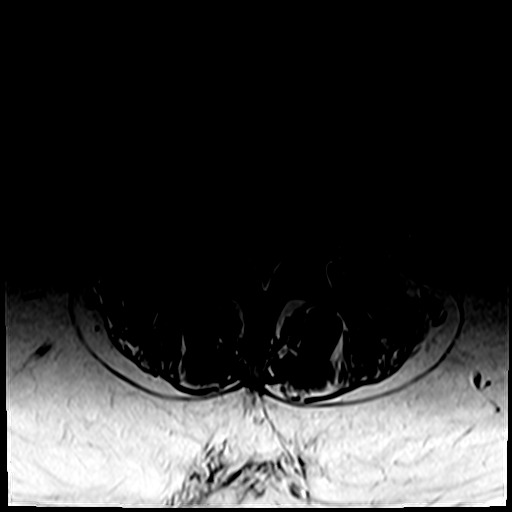
[im 20/40]
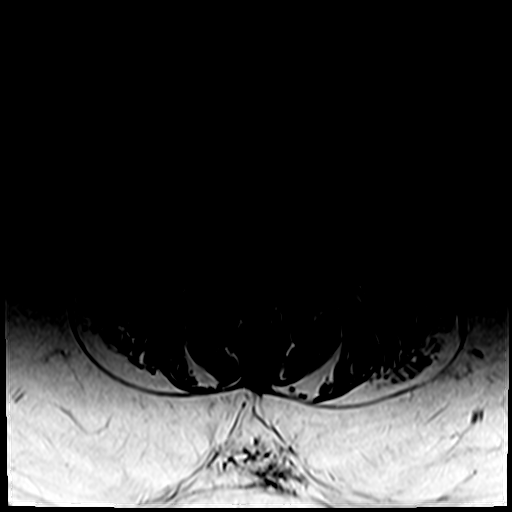
[im 23/40]
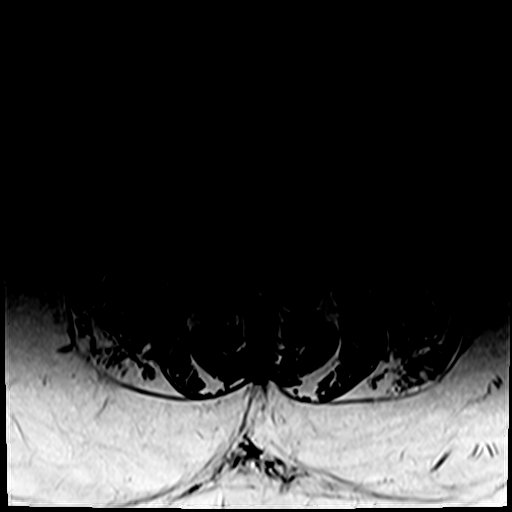
[im 28/40]
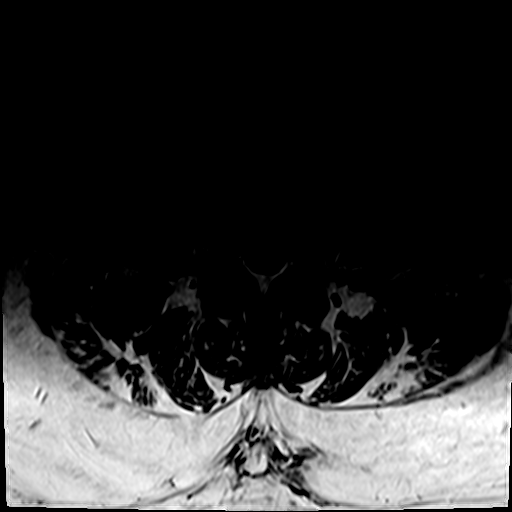
[im 34/40]
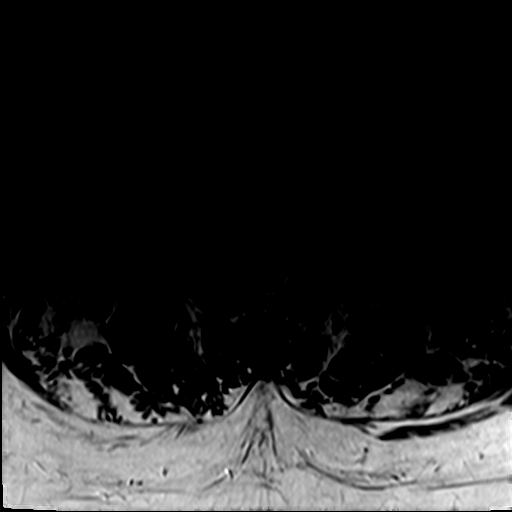
[im 40/40]
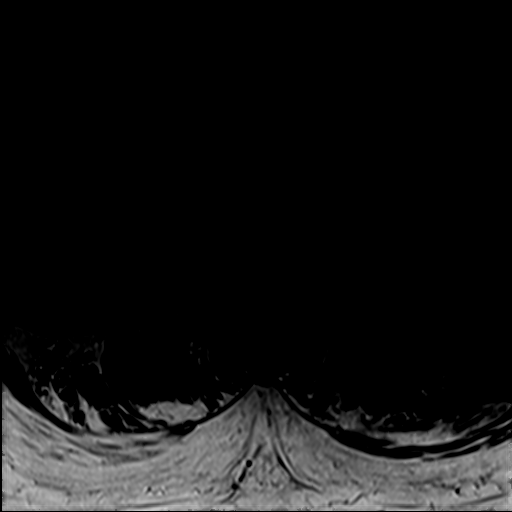

[33 of 48 positions shown; findings below may reference images not displayed]

FINDINGS: Segmentation: A transitional lumbosacral vertebra is assumed to
represent sacralized L5 level. Careful correlation with this
numbering strategy prior to any procedural intervention would be
recommended.

Alignment:  Physiologic.

Vertebrae: No fracture, evidence of discitis, or bone lesion.
Congenitally small spinal canal.

Conus medullaris and cauda equina: Conus extends to the L1-2 level.
Conus and cauda equina appear normal.

Paraspinal and other soft tissues: Negative.

Disc levels:

T12-L1: No spinal canal or neural foraminal stenosis.

L1-2: Epidural lipomatosis resulting in mild narrowing of the thecal
sac. Mild facet degenerative no significant neural foraminal
narrowing.

L2-3: Shallow disc bulge mildly asymmetric to the left, moderate
right and mild left facet degenerative changes and epidural
lipomatosis resulting in moderate narrowing of the thecal sac, and
moderate left neural foraminal narrowing.

L3-4: Disc bulge with superimposed inferiorly migrating right
subarticular disc extrusion, mild-to-moderate facet degenerative
changes and epidural lipomatosis. Findings result in moderate spinal
canal stenosis with narrowing of the right subarticular zone, likely
impinging on the traversing right L4 nerve root, and moderate
bilateral neural foraminal narrowing.

L4-5: Disc bulge, mild to moderate facet degenerative changes and
epidural lipomatosis resulting in mild-to-moderate narrowing of the
thecal sac with narrowing of the bilateral subarticular zones,
moderate right and mild left neural foraminal narrowing.

L5-S1: Mild facet degenerative changes. No spinal canal or neural
foraminal stenosis.
IMPRESSION: 1. Transitional lumbosacral anatomy.
2. Degenerative changes of the lumbar spine and epidural lipomatosis
superimposed on a congenitally small spinal canal resulting in
moderate spinal canal stenosis at L2-3 and L3-4 and mild-to-moderate
at L4-5.
3. At L3-4, a right subarticular, inferiorly migrating disc
extrusion contribute to the spinal canal stenosis and resulting in
narrowing of the right subarticular zone, likely impinging on the
traversing right L4 nerve root.
4. Moderate left neural foraminal narrowing at L2-3 and bilaterally
at L3-4.

## 2021-05-21 NOTE — Progress Notes (Signed)
SITUATION ?Reason for Consult: Evaluation for Prefabricated Diabetic Shoes and Custom Diabetic Inserts. ?Patient / Caregiver Report: Patient would like well fitting shoes ? ?OBJECTIVE DATA: ?Patient History / Diagnosis:  ?  ICD-10-CM   ?1. Diabetic polyneuropathy associated with type 2 diabetes mellitus ([place])  E11.42   ?  ? ? ?Physician Treating Diabetes:  Olin Hauser, DO ? ?Current or Previous Devices:   Historical user ? ?In-Person Foot Examination: ?Ulcers & Callousing:   None ?Deformities:    Pes planus ?Sensation:    Compromised  ?Shoe Size:     14XW ? ?ORTHOTIC RECOMMENDATION ?Recommended Devices: ?- 1x pair prefabricated PDAC approved diabetic shoes; Patient Selected Orthofeet Sprint Black Size 14XW ?- 3x pair custom-to-patient PDAC approved vacuum formed diabetic insoles. ? ?GOALS OF SHOES AND INSOLES ?- Reduce shear and pressure ?- Reduce / Prevent callus formation ?- Reduce / Prevent ulceration ?- Protect the fragile healing compromised diabetic foot. ? ?Patient would benefit from diabetic shoes and inserts as patient has diabetes mellitus and the patient has one or more of the following conditions: ?- History of partial or complete amputation of the foot ?- History of previous foot ulceration. ?- History of pre-ulcerative callus ?- Peripheral neuropathy with evidence of callus formation ?- Foot deformity ?- Poor circulation ? ?ACTIONS PERFORMED ?Potential out of pocket cost was communicated to patient. Patient understood and consented to measurement and casting. Patient was casted for insoles via crush box and measured for shoes via brannock device. Procedure was explained and patient tolerated procedure well. All questions were answered and concerns addressed. Casts were shipped to central fabrication for HOLD until Certificate of Medical Necessity or otherwise necessary authorization from insurance is obtained. ? ?PLAN ?Shoes are to be ordered and casts released from hold once all  appropriate paperwork is complete. Patient is to be contacted and scheduled for fitting once shoes and insoles have been fabricated and received. ? ?

## 2021-05-23 NOTE — Progress Notes (Signed)
PROVIDER NOTE: Information contained herein reflects review and annotations entered in association with encounter. Interpretation of such information and data should be left to medically-trained personnel. Information provided to patient can be located elsewhere in the medical record under "Patient Instructions". Document created using STT-dictation technology, any transcriptional errors that may result from process are unintentional.  ?  ?Patient: James [name]  Service Category: E/M  Provider: Gaspar Cola, MD  ?DOB: August 23, 1960  DOS: 05/24/2021  Specialty: Interventional Pain Management  ?MRN: [id number]  Setting: Ambulatory outpatient  PCP: James Hauser, DO  ?Type: Established Patient    Referring Provider: Nobie [name] *  ?Location: Office  Delivery: Face-to-face    ? ?HPI  ?James [name], a 61 y.o. year old male, is here today because of his Chronic pain syndrome [G89.4]. James [name] primary complain today is Back Pain (Lumbar bilateral), Leg Pain (Bilateral ), and Shoulder Pain (Left ) ?Last encounter: My last encounter with him was on 04/29/2021. ?Pertinent problems: James [name] has Chronic pain syndrome; DDD (degenerative disc disease), lumbar; Primary osteoarthritis involving multiple joints; Weakness of lower extremities (Bilateral); Chronic lower extremity pain (1ry area of Pain) (Bilateral); Chronic low back pain (4th area of Pain) (Bilateral) w/ sciatica (Bilateral); Chronic shoulder pain (5th area of Pain) (Left); Chronic hip pain (2ry area of Pain) (Bilateral); Chronic knee pain (3ry area of Pain) (Bilateral); Osteoarthritis of hips (Bilateral); Osteoarthritis of hip (Left); Osteoarthritis of hip (Right); Osteoarthritis of knee (Left); Lumbosacral radiculopathy at L5 (Left); Diabetic neuropathy ([place]); Osteoarthritis of AC (acromioclavicular) joint (Left); Osteoarthritis of shoulder (Left); Rotator cuff arthropathy of shoulder (Left); 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 ?Pain Assessment: Severity of Chronic pain is reported as a 10-Worst pain ever/10. Location: Back (left shoulder pain) Lower, Left, Right/? into legs.. Onset: More than a month ago. Quality: Discomfort, Constant. Timing: Constant. Modifying factor(s): denies. ?Vitals:  height is 6' (1.829 m) and weight is 348 lb (157.9 kg) (abnormal). His temporal temperature is 97.3 ?F (36.3 ?C) (abnormal). His blood pressure is 123/94 (abnormal) and his pulse is 90. His respiration is 16 and oxygen saturation is 97%.  ? ?Reason for encounter: medication management.  The patient indicates doing well with the current medication regimen. No adverse reactions or side effects reported to the medications.   Unfortunately, the patient has apparently used more medication than prescribed and he comes in today with only 4 out of 60 pills left.  The patient has been made aware of the fact that we will not be providing him with any early refills if he takes more medicine than prescribed.  Furthermore, we had initially communicated to this patient that continuing with pharmacotherapy was dependent on him losing an average of 2.75 pounds per month.  At the time that he was informed his weight was 375 pounds.  According to the electronic medical record his last weight was on 05/24/2021 and at that time his weight was 348 pounds. ? ?The patient indicates having more pain in the lower back and going down both lower extremities all the way down to the top of his feet and what appears to be an L5 dermatomal distribution.  A recent MRI done on 05/21/2021 shows a congenitally small spinal canal resulting in moderate spinal  canal stenosis at L2-3, L3-4, and L4-5.  At the L3-4 level he has right subarticular disc extrusion contributing to spinal canal stenosis and resulting in narrowing of the right subarticular zone likely impinging on the traversing right L4 nerve root.  In addition she also has moderate left neural foraminal narrowing at L2-3 and bilaterally at the L3-4 level.  In an attempt to treat this entire area, I will be scheduling him to come in for a right-sided L4-5 interlaminar LESI.  Today the patient comes in in a wheelchair still having some difficulty ambulating.  Physical exam shows difficulty with dorsiflexion of his right foot suggesting right L5 weakness. ? ?The patient indicates having quite a bit of pain today and he has requested to see if there is anything that we can offer him today.  His lab work seems to be relatively within normal limits and therefore I have ordered a Toradol/Norflex 60/60 mg IM injection to help break some of this pain cycle.  I will be scheduling him to come in for the [place].  He was reminded that we will not be providing him with early refills if he chooses to use more medication than prescribed.  He understood and accepted. ? ?RTCB: 07/28/2021 ? ?Pharmacotherapy Assessment  ?Analgesic: Oxycodone/APAP 10/325 (# 60/month), 1 tab p.o. twice daily (last filled on 02/03/2021) Needs to loose an average of 2.75 lbs/mo to be considered for opioid pharmacotherapy.  ?MME/day: 30 mg/day  ? ?Monitoring: ?[place] PMP: PDMP reviewed during this encounter.       ?Pharmacotherapy: No side-effects or adverse reactions reported. ?Compliance: No problems identified. ?Effectiveness: Clinically acceptable. ? ?James Billow, RN  05/24/2021  9:06 AM  Sign when Signing Visit ?Patient reports that he is taking additional pain medication because his pain is so great.  His pill count is short today only having enough (as prescribed) to last x 2 days.  Patient is forthcoming about taking the extra medication.   ? ?James Billow, RN  05/24/2021  9:05 AM  Sign when Signing Visit ?Nursing Pain Medication Assessment:  ?Safety precautions to be maintained throughout the outpatient stay will include: orient to surroundings, keep bed in low position, maintain call bell within reach at all times, provide assistance with transfer out of bed and ambulation.  ?Medication Inspection Compliance: Pill count conducted under aseptic conditions, in front of the patient. Neither the pills nor the bottle was removed from the patient's sight at any time. Once count was completed pills were immediately returned to the patient in their original bottle. ? ?Medication: Oxycodone IR ?Pill/Patch Count:  4 of 60 pills remain ?Pill/Patch Appearance: Markings consistent with prescribed medication ?Bottle Appearance: Standard pharmacy container. Clearly labeled. ?Filled Date: 04 / 13 / 2023 ?Last Medication intake:  Today ?   UDS:  ?Summary  ?Date Value Ref Range Status  ?02/15/2021 Note  Final  ?  Comment:  ?  ==================================================================== ?Compliance Drug Analysis, Ur ?==================================================================== ?Test                             Result       Flag       Units ? ?Drug Present and Declared for Prescription Verification ?  Oxycodone                      3020         EXPECTED   ng/mg creat ?  Oxymorphone  1094         EXPECTED   ng/mg creat ?  Noroxycodone                   3969         EXPECTED   ng/mg creat ?  Noroxymorphone                 265          EXPECTED   ng/mg creat ?   Sources of oxycodone are scheduled prescription medications. ?   Oxymorphone, noroxycodone, and noroxymorphone are expected ?   metabolites of oxycodone. Oxymorphone is also available as a ?   scheduled prescription medication. ? ?  Pregabalin                     PRESENT      EXPECTED ?  Acetaminophen                  PRESENT       EXPECTED ?==================================================================== ?Test                      Result    Flag   Units      Ref Range ?  Creatinine              172              mg/dL      >=20 ?====================================================

## 2021-05-24 ENCOUNTER — Encounter: Payer: Self-pay | Admitting: Pain Medicine

## 2021-05-24 ENCOUNTER — Other Ambulatory Visit: Payer: Self-pay

## 2021-05-24 ENCOUNTER — Ambulatory Visit: Payer: No Typology Code available for payment source | Attending: Pain Medicine | Admitting: Pain Medicine

## 2021-05-24 VITALS — BP 123/94 | HR 90 | Temp 97.3°F | Resp 16 | Ht 72.0 in | Wt 348.0 lb

## 2021-05-24 DIAGNOSIS — M25512 Pain in left shoulder: Secondary | ICD-10-CM | POA: Insufficient documentation

## 2021-05-24 DIAGNOSIS — Z79899 Other long term (current) drug therapy: Secondary | ICD-10-CM

## 2021-05-24 DIAGNOSIS — G894 Chronic pain syndrome: Secondary | ICD-10-CM | POA: Diagnosis not present

## 2021-05-24 DIAGNOSIS — M1612 Unilateral primary osteoarthritis, left hip: Secondary | ICD-10-CM

## 2021-05-24 DIAGNOSIS — M545 Low back pain, unspecified: Secondary | ICD-10-CM

## 2021-05-24 DIAGNOSIS — M1611 Unilateral primary osteoarthritis, right hip: Secondary | ICD-10-CM

## 2021-05-24 DIAGNOSIS — G8929 Other chronic pain: Secondary | ICD-10-CM | POA: Diagnosis not present

## 2021-05-24 DIAGNOSIS — M5441 Lumbago with sciatica, right side: Secondary | ICD-10-CM | POA: Diagnosis not present

## 2021-05-24 DIAGNOSIS — M1712 Unilateral primary osteoarthritis, left knee: Secondary | ICD-10-CM | POA: Diagnosis not present

## 2021-05-24 DIAGNOSIS — M79605 Pain in left leg: Secondary | ICD-10-CM | POA: Insufficient documentation

## 2021-05-24 DIAGNOSIS — R937 Abnormal findings on diagnostic imaging of other parts of musculoskeletal system: Secondary | ICD-10-CM | POA: Insufficient documentation

## 2021-05-24 DIAGNOSIS — M5442 Lumbago with sciatica, left side: Secondary | ICD-10-CM | POA: Insufficient documentation

## 2021-05-24 DIAGNOSIS — M129 Arthropathy, unspecified: Secondary | ICD-10-CM | POA: Insufficient documentation

## 2021-05-24 DIAGNOSIS — Z6841 Body Mass Index (BMI) 40.0 and over, adult: Secondary | ICD-10-CM | POA: Diagnosis not present

## 2021-05-24 DIAGNOSIS — Z79891 Long term (current) use of opiate analgesic: Secondary | ICD-10-CM | POA: Diagnosis not present

## 2021-05-24 DIAGNOSIS — M79604 Pain in right leg: Secondary | ICD-10-CM | POA: Diagnosis not present

## 2021-05-24 DIAGNOSIS — M2140 Flat foot [pes planus] (acquired), unspecified foot: Secondary | ICD-10-CM | POA: Insufficient documentation

## 2021-05-24 DIAGNOSIS — M160 Bilateral primary osteoarthritis of hip: Secondary | ICD-10-CM | POA: Diagnosis not present

## 2021-05-24 MED ORDER — OXYCODONE HCL 10 MG PO TABS: 10.0000 mg | ORAL_TABLET | Freq: Two times a day (BID) | ORAL | 0 refills | Status: DC

## 2021-05-24 MED ORDER — ORPHENADRINE CITRATE 30 MG/ML IJ SOLN
60.0000 mg | Freq: Once | INTRAMUSCULAR | Status: AC
  Administered 2021-05-24: 60 mg via INTRAMUSCULAR

## 2021-05-24 MED ORDER — ORPHENADRINE CITRATE 30 MG/ML IJ SOLN
INTRAMUSCULAR | Status: AC
  Filled 2021-05-24: qty 2

## 2021-05-24 MED ORDER — KETOROLAC TROMETHAMINE 60 MG/2ML IM SOLN
INTRAMUSCULAR | Status: AC
  Filled 2021-05-24: qty 2

## 2021-05-24 MED ORDER — KETOROLAC TROMETHAMINE 60 MG/2ML IM SOLN
60.0000 mg | Freq: Once | INTRAMUSCULAR | Status: AC
  Administered 2021-05-24: 60 mg via INTRAMUSCULAR

## 2021-05-24 NOTE — Patient Instructions (Signed)
____________________________________________________________________________________________ ? ?General Risks and Possible Complications ? ?Patient Responsibilities: It is important that you read this as it is part of your informed consent. It is our duty to inform you of the risks and possible complications associated with treatments offered to you. It is your responsibility as a patient to read this and to ask questions about anything that is not clear or that you believe was not covered in this document. ? ?Patient?s Rights: You have the right to refuse treatment. You also have the right to change your mind, even after initially having agreed to have the treatment done. However, under this last option, if you wait until the last second to change your mind, you may be charged for the materials used up to that point. ? ?Introduction: Medicine is not an Chief Strategy Officer. Everything in Medicine, including the lack of treatment(s), carries the potential for danger, harm, or loss (which is by definition: Risk). In Medicine, a complication is a secondary problem, condition, or disease that can aggravate an already existing one. All treatments carry the risk of possible complications. The fact that a side effects or complications occurs, does not imply that the treatment was conducted incorrectly. It must be clearly understood that these can happen even when everything is done following the highest safety standards. ? ?No treatment: You can choose not to proceed with the proposed treatment alternative. The ?PRO(s)? would include: avoiding the risk of complications associated with the therapy. The ?CON(s)? would include: not getting any of the treatment benefits. These benefits fall under one of three categories: diagnostic; therapeutic; and/or palliative. Diagnostic benefits include: getting information which can ultimately lead to improvement of the disease or symptom(s). Therapeutic benefits are those associated with the  successful treatment of the disease. Finally, palliative benefits are those related to the decrease of the primary symptoms, without necessarily curing the condition (example: decreasing the pain from a flare-up of a chronic condition, such as incurable terminal cancer). ? ?General Risks and Complications: These are associated to most interventional treatments. They can occur alone, or in combination. They fall under one of the following six (6) categories: no benefit or worsening of symptoms; bleeding; infection; nerve damage; allergic reactions; and/or death. ?No benefits or worsening of symptoms: In Medicine there are no guarantees, only probabilities. No healthcare provider can ever guarantee that a medical treatment will work, they can only state the probability that it may. Furthermore, there is always the possibility that the condition may worsen, either directly, or indirectly, as a consequence of the treatment. ?Bleeding: This is more common if the patient is taking a blood thinner, either prescription or over the counter (example: Goody Powders, Fish oil, Aspirin, Garlic, etc.), or if suffering a condition associated with impaired coagulation (example: Hemophilia, cirrhosis of the liver, low platelet counts, etc.). However, even if you do not have one on these, it can still happen. If you have any of these conditions, or take one of these drugs, make sure to notify your treating physician. ?Infection: This is more common in patients with a compromised immune system, either due to disease (example: diabetes, cancer, human immunodeficiency virus [HIV], etc.), or due to medications or treatments (example: therapies used to treat cancer and rheumatological diseases). However, even if you do not have one on these, it can still happen. If you have any of these conditions, or take one of these drugs, make sure to notify your treating physician. ?Nerve Damage: This is more common when the treatment is an invasive  one, but it can also happen with the use of medications, such as those used in the treatment of cancer. The damage can occur to small secondary nerves, or to large primary ones, such as those in the spinal cord and brain. This damage may be temporary or permanent and it may lead to impairments that can range from temporary numbness to permanent paralysis and/or brain death. ?Allergic Reactions: Any time a substance or material comes in contact with our body, there is the possibility of an allergic reaction. These can range from a mild skin rash (contact dermatitis) to a severe systemic reaction (anaphylactic reaction), which can result in death. ?Death: In general, any medical intervention can result in death, most of the time due to an unforeseen complication. ?____________________________________________________________________________________________ ______________________________________________________________________ ? ?Preparing for Procedure with Sedation ? ?NOTICE: Due to recent regulatory changes, starting on August 17, 2020, procedures requiring intravenous (IV) sedation will no longer be performed at the [facility].  These types of procedures are required to be performed at [facility] ambulatory surgery facility.  We are very sorry for the inconvenience. ? ?Procedure appointments are limited to planned procedures: ?No Prescription Refills. ?No disability issues will be discussed. ?No medication changes will be discussed. ? ?Instructions: ?Oral Intake: Do not eat or drink anything for at least 8 hours prior to your procedure. (Exception: Blood Pressure Medication. See below.) ?Transportation: A driver is required. You may not drive yourself after the procedure. ?Blood Pressure Medicine: Do not forget to take your blood pressure medicine with a sip of water the morning of the procedure. If your Diastolic (lower reading) is above 100 mmHg, elective cases will be cancelled/rescheduled. ?Blood thinners:  These will need to be stopped for procedures. Notify our staff if you are taking any blood thinners. Depending on which one you take, there will be specific instructions on how and when to stop it. ?Diabetics on insulin: Notify the staff so that you can be scheduled 1st case in the morning. If your diabetes requires high dose insulin, take only ? of your normal insulin dose the morning of the procedure and notify the staff that you have done so. ?Preventing infections: Shower with an antibacterial soap the morning of your procedure. ?Build-up your immune system: Take 1000 mg of Vitamin C with every meal (3 times a day) the day prior to your procedure. ?Antibiotics: Inform the staff if you have a condition or reason that requires you to take antibiotics before dental procedures. ?Pregnancy: If you are pregnant, call and cancel the procedure. ?Sickness: If you have a cold, fever, or any active infections, call and cancel the procedure. ?Arrival: You must be in the facility at least 30 minutes prior to your scheduled procedure. ?Children: Do not bring children with you. ?Dress appropriately: There is always the possibility that your clothing may get soiled. ?Valuables: Do not bring any jewelry or valuables. ? ?Reasons to call and reschedule or cancel your procedure: (Following these recommendations will minimize the risk of a serious complication.) ?Surgeries: Avoid having procedures within 2 weeks of any surgery. (Avoid for 2 weeks before or after any surgery). ?Flu Shots: Avoid having procedures within 2 weeks of a flu shots. (Avoid for 2 weeks before or after immunizations). ?Barium: Avoid having a procedure within 7-10 days after having had a radiological study involving the use of radiological contrast. (Myelograms, Barium swallow or enema study). ?Heart attacks: Avoid any elective procedures or surgeries for the initial 6 months after a "Myocardial Infarction" (Heart Attack). ?  Blood thinners: It is imperative  that you stop these medications before procedures. Let us know if you if you take any blood thinner.  ?Infection: Avoid procedures during or within two weeks of an infection (including chest colds or gast

## 2021-05-24 NOTE — Progress Notes (Signed)
Patient reports that he is taking additional pain medication because his pain is so great.  His pill count is short today only having enough (as prescribed) to last x 2 days.  Patient is forthcoming about taking the extra medication.  ?

## 2021-05-24 NOTE — Addendum Note (Signed)
Addended by: Gardiner Barefoot on: 05/24/2021 01:32 PM ? ? Modules accepted: Orders ? ?

## 2021-05-24 NOTE — Progress Notes (Signed)
Nursing Pain Medication Assessment:  ?Safety precautions to be maintained throughout the outpatient stay will include: orient to surroundings, keep bed in low position, maintain call bell within reach at all times, provide assistance with transfer out of bed and ambulation.  ?Medication Inspection Compliance: Pill count conducted under aseptic conditions, in front of the patient. Neither the pills nor the bottle was removed from the patient's sight at any time. Once count was completed pills were immediately returned to the patient in their original bottle. ? ?Medication: Oxycodone IR ?Pill/Patch Count:  4 of 60 pills remain ?Pill/Patch Appearance: Markings consistent with prescribed medication ?Bottle Appearance: Standard pharmacy container. Clearly labeled. ?Filled Date: 04 / 13 / 2023 ?Last Medication intake:  Today ?

## 2021-06-01 ENCOUNTER — Other Ambulatory Visit: Payer: Self-pay | Admitting: Family Medicine

## 2021-06-01 DIAGNOSIS — Z794 Long term (current) use of insulin: Secondary | ICD-10-CM

## 2021-06-01 DIAGNOSIS — E1169 Type 2 diabetes mellitus with other specified complication: Secondary | ICD-10-CM

## 2021-06-02 MED ORDER — OZEMPIC (0.25 OR 0.5 MG/DOSE) 2 MG/3ML ~~LOC~~ SOPN
0.5000 mg | PEN_INJECTOR | SUBCUTANEOUS | 1 refills | Status: DC
Start: 2021-06-02 — End: 2021-06-25

## 2021-06-02 NOTE — Telephone Encounter (Signed)
Requested medication (s) are due for refill today: Yes ? ?Requested medication (s) are on the active medication list: Yes ? ?Last refill:  03/17/21 ? ?Future visit scheduled: Yes ? ?Notes to clinic:  Pharmacy asking for alternative. ? ? ? ?Requested Prescriptions  ?Pending Prescriptions Disp Refills  ? OZEMPIC, 0.25 OR 0.5 MG/DOSE, 2 MG/3ML SOPN [Pharmacy Med Name: OZEMPIC 0.25-0.5 MG/DOSE PEN]  0  ?  ? There is no refill protocol information for this order  ?  ? ?

## 2021-06-08 ENCOUNTER — Ambulatory Visit (INDEPENDENT_AMBULATORY_CARE_PROVIDER_SITE_OTHER): Payer: No Typology Code available for payment source | Admitting: Licensed Clinical Social Worker

## 2021-06-08 DIAGNOSIS — G894 Chronic pain syndrome: Secondary | ICD-10-CM

## 2021-06-08 DIAGNOSIS — M159 Polyosteoarthritis, unspecified: Secondary | ICD-10-CM

## 2021-06-09 ENCOUNTER — Ambulatory Visit
Payer: No Typology Code available for payment source | Attending: Student in an Organized Health Care Education/Training Program | Admitting: Student in an Organized Health Care Education/Training Program

## 2021-06-15 NOTE — Patient Instructions (Signed)
Visit Information  Thank you for taking time to visit with me today. Please don't hesitate to contact me if I can be of assistance to you before our next scheduled telephone appointment.  Following are the goals we discussed today:  Patient Goals/Self-Care Activities: Over the next 120 days Attend all scheduled medical appointments Utilize healthy coping skills and/or supportive resources discussed Contact PCP office with any questions or concerns  Our next appointment is by telephone on 07/13/28 at 11:30 AM  Please call the care guide team at [phone] if you need to cancel or reschedule your appointment.   If you are experiencing a Mental Health or [place] or need someone to talk to, please call the Suicide and Crisis Lifeline: 988 call 911   The patient verbalized understanding of instructions, educational materials, and care plan provided today and DECLINED offer to receive copy of patient instructions, educational materials, and care plan.   Christa See, MSW, [facility] Care Management [email or web address] Phone [phone] 6:03 AM

## 2021-06-15 NOTE — Chronic Care Management (AMB) (Signed)
Chronic Care Management    Clinical Social Work Note  06/15/2021 Name: James [name] MRN: [id number] DOB: 09/16/1960  Arsal Tappan is a 61 y.o. year old male who is a primary care patient of Olin Hauser, DO. The CCM team was consulted to assist the patient with chronic disease management and/or care coordination needs related to: [employer] .   Engaged with patient by telephone for follow up visit in response to provider referral for social work chronic care management and care coordination services.   Consent to Services:  The patient was given information about Chronic Care Management services, agreed to services, and gave verbal consent prior to initiation of services.  Please see initial visit note for detailed documentation.   Patient agreed to services and consent obtained.   Assessment: Review of patient past medical history, allergies, medications, and health status, including review of relevant consultants reports was performed today as part of a comprehensive evaluation and provision of chronic care management and care coordination services.     SDOH (Social Determinants of Health) assessments and interventions performed:    Advanced Directives Status: Not addressed in this encounter.  CCM Care Plan  No Known Allergies  Outpatient Encounter Medications as of 06/08/2021  Medication Sig Note   atorvastatin (LIPITOR) 40 MG tablet Take 1 tablet (40 mg total) by mouth daily.    Cholecalciferol (VITAMIN D3) 125 MCG (5000 UT) CAPS Take 1 capsule (5,000 Units total) by mouth daily with breakfast. Take along with calcium and magnesium.    hydrochlorothiazide (HYDRODIURIL) 25 MG tablet TAKE 1 TABLET BY MOUTH DAILY    insulin degludec ([name]) 100 UNIT/ML FlexTouch Pen Inject 25 Units into the skin daily. (Patient taking differently: Inject 20 Units into the skin daily.)    losartan (COZAAR) 25 MG tablet Take 1 tablet (25 mg total) by mouth daily.     metFORMIN (GLUCOPHAGE) 1000 MG tablet Take 1 tablet (1,000 mg total) by mouth daily with breakfast.    Oxycodone HCl 10 MG TABS Take 1 tablet (10 mg total) by mouth in the morning and at bedtime. Must last 30 days.    [START ON 06/28/2021] Oxycodone HCl 10 MG TABS Take 1 tablet (10 mg total) by mouth in the morning and at bedtime. Must last 30 days. 05/24/2021: WARNING: Not a Duplicate. Future prescription. DO NOT DELETE during hospital medication reconciliation or at discharge. [facility] Chronic Pain Management Patient    OZEMPIC, 0.25 OR 0.5 MG/DOSE, 2 MG/3ML SOPN Inject 0.5 mg into the skin once a week.    pregabalin (LYRICA) 150 MG capsule Take 2 caps for '300mg'$  in AM, and take 1 cap for '150mg'$  at night    varenicline (CHANTIX CONTINUING MONTH PAK) 1 MG tablet Take 1 tablet (1 mg total) by mouth 2 (two) times daily.    No facility-administered encounter medications on file as of 06/08/2021.    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) 04/29/2021   Abnormal EMG (electromyogram) (03/16/2021) 04/29/2021   Polyneuropathy, 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   Diabetic sensorimotor polyneuropathy ([place]) 04/29/2021  Diabetic neuropathy ([place]) 04/26/2021   Osteoarthritis of hips (Bilateral) 03/29/2021   Osteoarthritis of hip (Left) 03/29/2021   Osteoarthritis of hip (Right) 03/29/2021   Osteoarthritis of knee (Left) 03/29/2021   Elevated sed rate 03/29/2021   Vitamin D deficiency 03/29/2021   Elevated C-reactive protein (CRP)  03/29/2021   Lumbosacral radiculopathy at L5 (Left) 03/29/2021   Fall (03/22/2021) 03/29/2021   Pharmacologic therapy 02/15/2021   Disorder of skeletal system 02/15/2021   Problems influencing health status 02/15/2021   Chronic use of opiate for therapeutic purpose 02/15/2021   Type 2 diabetes mellitus with morbid obesity ([place]) 02/15/2021   Chronic lower extremity pain (1ry area of Pain) (Bilateral) 02/15/2021   Chronic low back pain (4th area of Pain) (Bilateral) w/ sciatica (Bilateral) 02/15/2021   Chronic shoulder pain (5th area of Pain) (Left) 02/15/2021   Chronic hip pain (2ry area of Pain) (Bilateral) 02/15/2021   Chronic knee pain (3ry area of Pain) (Bilateral) 02/15/2021   Weakness of lower extremities (Bilateral) 01/15/2021   Morbid obesity with BMI of 45.0-49.9, adult ([place]) 01/15/2021   Recurrent falls 01/15/2021   Chronic pain syndrome 11/09/2020   DDD (degenerative disc disease), lumbar 11/09/2020   Primary osteoarthritis involving multiple joints 11/09/2020   Diabetes mellitus ([place]) 11/09/2020    Conditions to be addressed/monitored: DMII, Osteoarthritis, and Chronic Pain Syndrome  Care Plan : LCSW Plan of Care  Updates made by Rebekah Chesterfield, LCSW since 06/15/2021 12:00 AM     Problem: Barriers to Treatment      Goal: Barriers to Treatment Identified and Managed   Start Date: 04/27/2021  Expected End Date: 07/16/2021  This Visit's Progress: On track  Recent Progress: On track  Priority: High  Note:   Current barriers:    Limited access to food and Housing barriers Clinical Goals: Patient will work with CCM LCSW to address needs related to psychosocial stressors to improve management of health conditions Clinical Interventions:  Assessment of needs, barriers , agencies contacted, as well as how impacting care Patient is following up with leasing office about spot on wait-list. He continues to reside with brother  Patient shared he has cleared all his debt, in  preparation for obtaining independent housing Patient commended for losing 27 pounds with diet modification. Strategies to maintain motivation discussed. Patient continues to stay active Reviewed upcoming appts Pt denies depression and anxiety symptoms. Was successful in identifying healthy coping skills (reading Bible) Solution-Focused Strategies employed:  Active listening / Reflection utilized  Emotional Support Provided Verbalization of feelings encouraged  Inter-disciplinary care team collaboration (see longitudinal plan of care) Patient Goals/Self-Care Activities: Over the next 120 days Attend all scheduled medical appointments Utilize healthy coping skills and/or supportive resources discussed Contact PCP office with any questions or concerns        Follow Up Plan: Appointment scheduled for SW follow up with client by phone on: 07/13/21  Christa See, MSW, [email or web address] Phone [phone] 6:05 AM

## 2021-06-16 DIAGNOSIS — E1169 Type 2 diabetes mellitus with other specified complication: Secondary | ICD-10-CM

## 2021-06-16 DIAGNOSIS — M159 Polyosteoarthritis, unspecified: Secondary | ICD-10-CM

## 2021-06-21 ENCOUNTER — Encounter: Payer: Self-pay | Admitting: Student in an Organized Health Care Education/Training Program

## 2021-06-21 ENCOUNTER — Ambulatory Visit
Payer: Medicare HMO | Attending: Student in an Organized Health Care Education/Training Program | Admitting: Student in an Organized Health Care Education/Training Program

## 2021-06-21 ENCOUNTER — Ambulatory Visit
Admission: RE | Admit: 2021-06-21 | Discharge: 2021-06-21 | Disposition: A | Discharge status: Home / Self Care | Payer: Medicare HMO | Source: Ambulatory Visit | Attending: Student in an Organized Health Care Education/Training Program | Admitting: Student in an Organized Health Care Education/Training Program

## 2021-06-21 VITALS — BP 116/87 | HR 81 | Temp 97.7°F | Resp 21 | Ht 72.0 in | Wt 348.0 lb

## 2021-06-21 DIAGNOSIS — R6889 Other general symptoms and signs: Secondary | ICD-10-CM | POA: Diagnosis not present

## 2021-06-21 DIAGNOSIS — M5441 Lumbago with sciatica, right side: Secondary | ICD-10-CM | POA: Insufficient documentation

## 2021-06-21 DIAGNOSIS — M5417 Radiculopathy, lumbosacral region: Secondary | ICD-10-CM | POA: Diagnosis not present

## 2021-06-21 DIAGNOSIS — G894 Chronic pain syndrome: Secondary | ICD-10-CM | POA: Diagnosis not present

## 2021-06-21 DIAGNOSIS — G8929 Other chronic pain: Secondary | ICD-10-CM | POA: Insufficient documentation

## 2021-06-21 DIAGNOSIS — M5442 Lumbago with sciatica, left side: Secondary | ICD-10-CM | POA: Insufficient documentation

## 2021-06-21 MED ORDER — LIDOCAINE HCL 2 % IJ SOLN
20.0000 mL | Freq: Once | INTRAMUSCULAR | Status: AC
Start: 2021-06-21 — End: 2021-06-21
  Administered 2021-06-21: 400 mg

## 2021-06-21 MED ORDER — SODIUM CHLORIDE 0.9% FLUSH
2.0000 mL | Freq: Once | INTRAVENOUS | Status: AC
  Administered 2021-06-21: 2 mL

## 2021-06-21 MED ORDER — DEXAMETHASONE SODIUM PHOSPHATE 10 MG/ML IJ SOLN
10.0000 mg | Freq: Once | INTRAMUSCULAR | Status: AC
  Administered 2021-06-21: 10 mg

## 2021-06-21 MED ORDER — IOHEXOL 180 MG/ML  SOLN
10.0000 mL | Freq: Once | INTRAMUSCULAR | Status: AC
  Administered 2021-06-21: 5 mL via EPIDURAL

## 2021-06-21 MED ORDER — DIAZEPAM 5 MG PO TABS
ORAL_TABLET | ORAL | Status: AC
  Filled 2021-06-21: qty 1

## 2021-06-21 MED ORDER — ROPIVACAINE HCL 2 MG/ML IJ SOLN
2.0000 mL | Freq: Once | INTRAMUSCULAR | Status: AC
  Administered 2021-06-21: 2 mL via EPIDURAL

## 2021-06-21 NOTE — Progress Notes (Signed)
PROVIDER NOTE: Interpretation of information contained herein should be left to medically-trained personnel. Specific patient instructions are provided elsewhere under "Patient Instructions" section of medical record. This document was created in part using STT-dictation technology, any transcriptional errors that may result from this process are unintentional.  Patient: James [name] Type: Established DOB: Mar 30, 1960 MRN: [id number] PCP: Olin Hauser, DO  Service: Procedure DOS: 06/21/2021 Setting: Ambulatory Location: Ambulatory outpatient facility Delivery: Face-to-face Provider: Gillis Santa, MD Specialty: Interventional Pain Management Specialty designation: 09 Location: Outpatient facility Ref. Prov.: Nobie Putnam *    Primary Reason for Visit: Interventional Pain Management Treatment. CC: Back Pain (Right side)   Procedure:           Type: Lumbar epidural steroid injection (LESI) (interlaminar) #1    Laterality: Right   Level:  L4-5 Level.  Imaging: Fluoroscopic guidance Anesthesia: Local anesthesia (1-2% Lidocaine) Anxiolysis: minimal  Valium 5 mg PO . DOS: 06/21/2021  Performed by: Gillis Santa, MD  Purpose: Diagnostic/Therapeutic Indications: Lumbar radicular pain of intraspinal etiology of more than 4 weeks that has failed to respond to conservative therapy and is severe enough to impact quality of life or function. 1. Lumbosacral radiculopathy at L5 (R>L)   2. 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 Chronic pain syndrome    NAS-11 Pain score:   Pre-procedure: 10-Worst pain ever/10   Post-procedure: 7 /10      Position / Prep / Materials:  Position: Prone w/ head of the table raised (slight reverse trendelenburg) to facilitate breathing.  Prep solution: DuraPrep (Iodine Povacrylex [0.7% available iodine] and Isopropyl Alcohol, 74% w/w) Prep Area: Entire Posterior Lumbar Region from lower scapular tip  down to mid buttocks area and from flank to flank. Materials:  Tray: Epidural tray Needle(s):  Type: Epidural needle          Gauge (G):  17 Length: Long (20cm) Qty: 1  Pre-op H&P Assessment:  James [name] is a 61 y.o. (year old), male patient, seen today for interventional treatment. He  has a past surgical history that includes Colonoscopy with propofol; Appendectomy; and Colonoscopy with propofol (N/A, 04/23/2021). James [name] has a current medication list which includes the following prescription(s): atorvastatin, vitamin d3, hydrochlorothiazide, insulin degludec, losartan, metformin, oxycodone hcl, [START ON 06/28/2021] oxycodone hcl, ozempic (0.25 or 0.5 mg/dose), pregabalin, and varenicline. His primarily concern today is the Back Pain (Right side)  Initial Vital Signs:  Pulse/HCG Rate: 81ECG Heart Rate: 90 Temp: 97.7 F (36.5 C) Resp: 16 BP:  (!) 141/67 SpO2: 96 %  BMI: Estimated body mass index is 47.2 kg/m as calculated from the following:   Height as of this encounter: 6' (1.829 m).   Weight as of this encounter: 348 lb (157.9 kg).  Risk Assessment: Allergies: Reviewed. He has No Known Allergies.  Allergy Precautions: None required Coagulopathies: Reviewed. None identified.  Blood-thinner therapy: None at this time Active Infection(s): Reviewed. None identified. James [name] is afebrile  Site Confirmation: James [name] was asked to confirm the procedure and laterality before marking the site Procedure checklist: Completed Consent: Before the procedure and under the influence of no sedative(s), amnesic(s), or anxiolytics, the patient was informed of the treatment options, risks and possible complications. To fulfill our ethical and legal obligations, as recommended by the American Medical Association's Code of Ethics, I have informed the patient of my clinical impression; the nature and purpose of the treatment or procedure; the risks, benefits, and possible  complications of  the intervention; the alternatives, including doing nothing; the risk(s) and benefit(s) of the alternative treatment(s) or procedure(s); and the risk(s) and benefit(s) of doing nothing. The patient was provided information about the general risks and possible complications associated with the procedure. These may include, but are not limited to: failure to achieve desired goals, infection, bleeding, organ or nerve damage, allergic reactions, paralysis, and death. In addition, the patient was informed of those risks and complications associated to Spine-related procedures, such as failure to decrease pain; infection (i.e.: Meningitis, epidural or intraspinal abscess); bleeding (i.e.: epidural hematoma, subarachnoid hemorrhage, or any other type of intraspinal or peri-dural bleeding); organ or nerve damage (i.e.: Any type of peripheral nerve, nerve root, or spinal cord injury) with subsequent damage to sensory, motor, and/or autonomic systems, resulting in permanent pain, numbness, and/or weakness of one or several areas of the body; allergic reactions; (i.e.: anaphylactic reaction); and/or death. Furthermore, the patient was informed of those risks and complications associated with the medications. These include, but are not limited to: allergic reactions (i.e.: anaphylactic or anaphylactoid reaction(s)); adrenal axis suppression; blood sugar elevation that in diabetics may result in ketoacidosis or comma; water retention that in patients with history of congestive heart failure may result in shortness of breath, pulmonary edema, and decompensation with resultant heart failure; weight gain; swelling or edema; medication-induced neural toxicity; particulate matter embolism and blood vessel occlusion with resultant organ, and/or nervous system infarction; and/or aseptic necrosis of one or more joints. Finally, the patient was informed that Medicine is not an exact science; therefore, there is also  the possibility of unforeseen or unpredictable risks and/or possible complications that may result in a catastrophic outcome. The patient indicated having understood very clearly. We have given the patient no guarantees and we have made no promises. Enough time was given to the patient to ask questions, all of which were answered to the patient's satisfaction. James [name] has indicated that he wanted to continue with the procedure. Attestation: I, the ordering provider, attest that I have discussed with the patient the benefits, risks, side-effects, alternatives, likelihood of achieving goals, and potential problems during recovery for the procedure that I have provided informed consent. Date  Time: 06/21/2021 10:24 AM  Pre-Procedure Preparation:  Monitoring: As per clinic protocol. Respiration, ETCO2, SpO2, BP, heart rate and rhythm monitor placed and checked for adequate function Safety Precautions: Patient was assessed for positional comfort and pressure points before starting the procedure. Time-out: I initiated and conducted the "Time-out" before starting the procedure, as per protocol. The patient was asked to participate by confirming the accuracy of the "Time Out" information. Verification of the correct person, site, and procedure were performed and confirmed by me, the nursing staff, and the patient. "Time-out" conducted as per Joint Commission's Universal Protocol (UP.01.01.01). Time: 1123  Description/Narrative of Procedure:          Target: Epidural space via interlaminar opening, initially targeting the lower laminar border of the superior vertebral body. Region: Lumbar Approach: Percutaneous paravertebral  Rationale (medical necessity): procedure needed and proper for the diagnosis and/or treatment of the patient's medical symptoms and needs. Procedural Technique Safety Precautions: Aspiration looking for blood return was conducted prior to all injections. At no point did we inject  any substances, as a needle was being advanced. No attempts were made at seeking any paresthesias. Safe injection practices and needle disposal techniques used. Medications properly checked for expiration dates. SDV (single dose vial) medications used. Description of the Procedure: Protocol guidelines were followed.  The procedure needle was introduced through the skin, ipsilateral to the reported pain, and advanced to the target area. Bone was contacted and the needle walked caudad, until the lamina was cleared. The epidural space was identified using "loss-of-resistance technique" with 2-3 ml of PF-NaCl (0.9% NSS), in a 5cc LOR glass syringe.  6 cc solution made of 3cc of preservative-free saline, 2 cc of 0.2% ropivacaine, 1 cc of Decadron 10 mg/cc.   Vitals:   06/21/21 1127 06/21/21 1132 06/21/21 1134 06/21/21 1137  BP: (!) 154/116 (!) 157/117 (!) 139/118 116/87  Pulse:      Resp: (!) 21 (!) 21 (!) 21   Temp:      SpO2: 90% 91% 92%   Weight:      Height:        Start Time: 1123 hrs. End Time: 1133 hrs.  Imaging Guidance (Spinal):          Type of Imaging Technique: Fluoroscopy Guidance (Spinal) Indication(s): Assistance in needle guidance and placement for procedures requiring needle placement in or near specific anatomical locations not easily accessible without such assistance. Exposure Time: Please see nurses notes. Contrast: Before injecting any contrast, we confirmed that the patient did not have an allergy to iodine, shellfish, or radiological contrast. Once satisfactory needle placement was completed at the desired level, radiological contrast was injected. Contrast injected under live fluoroscopy. No contrast complications. See chart for type and volume of contrast used. Fluoroscopic Guidance: I was personally present during the use of fluoroscopy. "Tunnel Vision Technique" used to obtain the best possible view of the target area. Parallax error corrected before commencing the  procedure. "Direction-depth-direction" technique used to introduce the needle under continuous pulsed fluoroscopy. Once target was reached, antero-posterior, oblique, and lateral fluoroscopic projection used confirm needle placement in all planes. Images permanently stored in EMR. Interpretation: I personally interpreted the imaging intraoperatively. Adequate needle placement confirmed in multiple planes. Appropriate spread of contrast into desired area was observed. No evidence of afferent or efferent intravascular uptake. No intrathecal or subarachnoid spread observed. Permanent images saved into the patient's record.   Post-operative Assessment:  Post-procedure Vital Signs:  Pulse/HCG Rate: 8191 Temp: 97.7 F (36.5 C) Resp:  (!) 21 BP: 116/87 SpO2: 92 %  EBL: None  Complications: No immediate post-treatment complications observed by team, or reported by patient.  Note: The patient tolerated the entire procedure well. A repeat set of vitals were taken after the procedure and the patient was kept under observation following institutional policy, for this type of procedure. Post-procedural neurological assessment was performed, showing return to baseline, prior to discharge. The patient was provided with post-procedure discharge instructions, including a section on how to identify potential problems. Should any problems arise concerning this procedure, the patient was given instructions to immediately contact us, at any time, without hesitation. In any case, we plan to contact the patient by telephone for a follow-up status report regarding this interventional procedure.  Comments:  No additional relevant information.  5 out of 5 strength bilateral lower extremity: Plantar flexion, dorsiflexion, knee flexion, knee extension.   Plan of Care  Orders:  Orders Placed This Encounter  Procedures   DG PAIN CLINIC C-ARM 1-60 MIN NO REPORT    Intraoperative interpretation by procedural physician  at [place].    Standing Status:   Standing    Number of Occurrences:   1    Order Specific Question:   Reason for exam:    Answer:   Assistance in  needle guidance and placement for procedures requiring needle placement in or near specific anatomical locations not easily accessible without such assistance.    Medications ordered for procedure: Meds ordered this encounter  Medications   iohexol (OMNIPAQUE) 180 MG/ML injection 10 mL    Must be Myelogram-compatible. If not available, you may substitute with a water-soluble, non-ionic, hypoallergenic, myelogram-compatible radiological contrast medium.   lidocaine (XYLOCAINE) 2 % (with pres) injection 400 mg   sodium chloride flush (NS) 0.9 % injection 2 mL   ropivacaine (PF) 2 mg/mL (0.2%) (NAROPIN) injection 2 mL   dexamethasone (DECADRON) injection 10 mg   Medications administered: We administered iohexol, lidocaine, sodium chloride flush, ropivacaine (PF) 2 mg/mL (0.2%), and dexamethasone.  See the medical record for exact dosing, route, and time of administration.  Follow-up plan:   Return in about 4 weeks (around 07/19/2021), or f/up with Dr Dossie Arbour.      Recent Visits Date Type Provider Dept  05/24/21 Office Visit Milinda Pointer, MD [facility]  04/29/21 Office Visit Milinda Pointer, MD [facility]  03/29/21 Office Visit Milinda Pointer, MD [facility]  Showing recent visits within past 90 days and meeting all other requirements Today's Visits Date Type Provider Dept  06/21/21 Procedure visit Gillis Santa, MD [facility]  Showing today's visits and meeting all other requirements Future Appointments Date Type Provider Dept  07/19/21 Appointment Milinda Pointer, MD [facility]  07/21/21 Appointment Milinda Pointer, MD [facility]  Showing future appointments within next 90 days and meeting all other requirements  Disposition: Discharge  home  Discharge (Date  Time): 06/21/2021; 1139 hrs.   Primary Care Physician: Olin Hauser, DO Location: [facility] Note by: Gillis Santa, MD Date: 06/21/2021; Time: 11:55 AM  Disclaimer:  Medicine is not an exact science. The only guarantee in medicine is that nothing is guaranteed. It is important to note that the decision to proceed with this intervention was based on the information collected from the patient. The Data and conclusions were drawn from the patient's questionnaire, the interview, and the physical examination. Because the information was provided in large part by the patient, it cannot be guaranteed that it has not been purposely or unconsciously manipulated. Every effort has been made to obtain as much relevant data as possible for this evaluation. It is important to note that the conclusions that lead to this procedure are derived in large part from the available data. Always take into account that the treatment will also be dependent on availability of resources and existing treatment guidelines, considered by other Pain Management Practitioners as being common knowledge and practice, at the time of the intervention. For Medico-Legal purposes, it is also important to point out that variation in procedural techniques and pharmacological choices are the acceptable norm. The indications, contraindications, technique, and results of the above procedure should only be interpreted and judged by a Board-Certified Interventional Pain Specialist with extensive familiarity and expertise in the same exact procedure and technique.

## 2021-06-21 NOTE — Patient Instructions (Signed)

## 2021-06-21 NOTE — Progress Notes (Signed)
Safety precautions to be maintained throughout the outpatient stay will include: orient to surroundings, keep bed in low position, maintain call bell within reach at all times, provide assistance with transfer out of bed and ambulation.  

## 2021-06-22 ENCOUNTER — Telehealth: Payer: Self-pay | Admitting: *Deleted

## 2021-06-22 NOTE — Telephone Encounter (Signed)
No problems post procedure. 

## 2021-06-25 ENCOUNTER — Ambulatory Visit (INDEPENDENT_AMBULATORY_CARE_PROVIDER_SITE_OTHER): Payer: Medicare HMO | Admitting: Family Medicine

## 2021-06-25 VITALS — BP 136/68 | HR 78 | Ht 72.0 in | Wt 352.0 lb

## 2021-06-25 DIAGNOSIS — G894 Chronic pain syndrome: Secondary | ICD-10-CM

## 2021-06-25 DIAGNOSIS — Z6841 Body Mass Index (BMI) 40.0 and over, adult: Secondary | ICD-10-CM | POA: Diagnosis not present

## 2021-06-25 DIAGNOSIS — Z794 Long term (current) use of insulin: Secondary | ICD-10-CM

## 2021-06-25 DIAGNOSIS — G8929 Other chronic pain: Secondary | ICD-10-CM

## 2021-06-25 DIAGNOSIS — I10 Essential (primary) hypertension: Secondary | ICD-10-CM | POA: Diagnosis not present

## 2021-06-25 DIAGNOSIS — E785 Hyperlipidemia, unspecified: Secondary | ICD-10-CM | POA: Diagnosis not present

## 2021-06-25 DIAGNOSIS — R351 Nocturia: Secondary | ICD-10-CM | POA: Diagnosis not present

## 2021-06-25 DIAGNOSIS — M25551 Pain in right hip: Secondary | ICD-10-CM

## 2021-06-25 DIAGNOSIS — Z0000 Encounter for general adult medical examination without abnormal findings: Secondary | ICD-10-CM | POA: Diagnosis not present

## 2021-06-25 DIAGNOSIS — E1169 Type 2 diabetes mellitus with other specified complication: Secondary | ICD-10-CM | POA: Diagnosis not present

## 2021-06-25 DIAGNOSIS — M5136 Other intervertebral disc degeneration, lumbar region: Secondary | ICD-10-CM

## 2021-06-25 DIAGNOSIS — R6889 Other general symptoms and signs: Secondary | ICD-10-CM | POA: Diagnosis not present

## 2021-06-25 DIAGNOSIS — M159 Polyosteoarthritis, unspecified: Secondary | ICD-10-CM

## 2021-06-25 MED ORDER — HYDROCHLOROTHIAZIDE 25 MG PO TABS: 25.0000 mg | ORAL_TABLET | Freq: Every day | ORAL | 3 refills | Status: DC

## 2021-06-25 MED ORDER — ATORVASTATIN CALCIUM 40 MG PO TABS: 40.0000 mg | ORAL_TABLET | Freq: Every day | ORAL | 3 refills | Status: DC

## 2021-06-25 MED ORDER — LOSARTAN POTASSIUM 25 MG PO TABS: 25.0000 mg | ORAL_TABLET | Freq: Every day | ORAL | 3 refills | Status: DC

## 2021-06-25 MED ORDER — OZEMPIC (1 MG/DOSE) 4 MG/3ML ~~LOC~~ SOPN: 1.0000 mg | PEN_INJECTOR | SUBCUTANEOUS | 1 refills | Status: DC

## 2021-06-25 MED ORDER — METFORMIN HCL 1000 MG PO TABS: 1000.0000 mg | ORAL_TABLET | Freq: Every day | ORAL | 1 refills | Status: DC

## 2021-06-25 NOTE — Patient Instructions (Addendum)
Thank you for coming to the office today.  REDUCE [name] from 20 units daily down to 10 units daily. Eventually come off of it.  Increased Ozempic from 0.'5mg'$  up to '1mg'$  weekly, new order sent.  All other refills placed.  Referral to Physical therapy, they will call you. [facility] Address: [place], [address], [place], [place] [address] Phone: [phone]  --------------------------------------  Dennis Bast will call them:  [facility]   Address: [address], [place] [address] Phone: [phone]  Website: [email or web address] [address] [place], [place] [address] Phone: [phone] [email or web address]  [facility]  Address: [place], [place], [place] [address] Phone: [phone]   [facility] [address] [place], [place] [address] Phone: [phone]  [facility] Address: [place], [place],  [address]  Phone: [phone]   Please schedule a Follow-up Appointment to: Return in about 4 months (around 10/25/2021) for 4 month follow-up DM A1c.  If you have any other questions or concerns, please feel free to call the office or send a message through [place]. You may also schedule an earlier appointment if necessary.  Additionally, you may be receiving a survey about your experience at our office within a few days to 1 week by e-mail or mail. We value your feedback.  Nobie Putnam, DO [place]

## 2021-06-25 NOTE — Progress Notes (Signed)
Subjective:    Patient ID: James [name], male    DOB: 23-Jun-1960, 61 y.o.   MRN: [id number]  James [name] is a 61 y.o. male presenting on 06/25/2021 for Annual Exam   HPI  Here for Annual Physical  Chronic Pain Back Pain, Knee Joint pain Osteoarthritis See prior note for background medical history on this topic Followed by Dr Dossie Arbour at [facility] Pain Management Injection with improvement  Interested in PT for lower extremity pain  He is doing well on Hoveround PMD  Type 2 Diabetes He has reduced [name] insulin to 20u now, doing better, on Metformin 1000 BID, and on Ozempic 0.'5mg'$  weekly inj interested in dose increase He says CBG controlled  DM Shoes needed. Followed by Podiatry. They were ordered.  Due for Labs  Needs med Refills      03/19/2021   11:56 AM 03/19/2021   11:42 AM 03/12/2021    1:59 PM  Depression screen PHQ 2/9  Decreased Interest 1 0 0  Down, Depressed, Hopeless 1 0 0  PHQ - 2 Score 2 0 0  Altered sleeping 2    Tired, decreased energy 2    Change in appetite 2    Feeling bad or failure about yourself  0    Trouble concentrating 0    Moving slowly or fidgety/restless 0    Suicidal thoughts 0    PHQ-9 Score 8    Difficult doing work/chores Somewhat difficult      Past Medical History:  Diagnosis Date   Arthritis    Diabetes mellitus without complication ([place])    Hyperlipidemia    Hypertension    Past Surgical History:  Procedure Laterality Date   APPENDECTOMY     COLONOSCOPY WITH PROPOFOL     COLONOSCOPY WITH PROPOFOL N/A 04/23/2021   Procedure: COLONOSCOPY WITH PROPOFOL;  Surgeon: Jonathon Bellows, MD;  Location: [facility] ENDOSCOPY;  Service: Gastroenterology;  Laterality: N/A;   Social History   Socioeconomic History   Marital status: Single    Spouse name: Not on file   Number of children: Not on file   Years of education: Not on file   Highest education level: Not on file  Occupational History   Not on file  Tobacco Use   Smoking  status: Some Days    Packs/day: 0.10    Years: 25.00    Total pack years: 2.50    Types: Cigarettes   Smokeless tobacco: Never  Vaping Use   Vaping Use: Never used  Substance and Sexual Activity   Alcohol use: Never   Drug use: Never   Sexual activity: Not on file  Other Topics Concern   Not on file  Social History Narrative   Not on file   Social Determinants of Health   Financial Resource Strain: Low Risk  (03/19/2021)   Overall Financial Resource Strain (CARDIA)    Difficulty of Paying Living Expenses: Not hard at all  Food Insecurity: No Food Insecurity (03/19/2021)   Hunger Vital Sign    Worried About Running Out of Food in the Last Year: Never true    [place] in the Last Year: Never true  Transportation Needs: No Transportation Needs (03/19/2021)   PRAPARE - Hydrologist (Medical): No    Lack of Transportation (Non-Medical): No  Physical Activity: Inactive (03/19/2021)   Exercise Vital Sign    Days of Exercise per Week: 0 days    Minutes of Exercise per Session: 0 min  Stress: No Stress Concern Present (03/19/2021)   [place]    Feeling of Stress : Only a little  Social Connections: Socially Isolated (03/19/2021)   Social Connection and Isolation Panel [NHANES]    Frequency of Communication with Friends and Family: Three times a week    Frequency of Social Gatherings with Friends and Family: Once a week    Attends Religious Services: Never    Marine scientist or Organizations: No    Attends Archivist Meetings: Never    Marital Status: Divorced  Human resources officer Violence: Not At Risk (03/19/2021)   Humiliation, Afraid, Rape, and Kick questionnaire    Fear of Current or Ex-Partner: No    Emotionally Abused: No    Physically Abused: No    Sexually Abused: No   Family History  Problem Relation Age of Onset   Heart disease Mother    Arthritis Mother     Arthritis Father    Current Outpatient Medications on File Prior to Visit  Medication Sig   Cholecalciferol (VITAMIN D3) 125 MCG (5000 UT) CAPS Take 1 capsule (5,000 Units total) by mouth daily with breakfast. Take along with calcium and magnesium.   Oxycodone HCl 10 MG TABS Take 1 tablet (10 mg total) by mouth in the morning and at bedtime. Must last 30 days.   [START ON 06/28/2021] Oxycodone HCl 10 MG TABS Take 1 tablet (10 mg total) by mouth in the morning and at bedtime. Must last 30 days.   pregabalin (LYRICA) 150 MG capsule Take 2 caps for '300mg'$  in AM, and take 1 cap for '150mg'$  at night   varenicline (CHANTIX CONTINUING MONTH PAK) 1 MG tablet Take 1 tablet (1 mg total) by mouth 2 (two) times daily.   insulin degludec ([name]) 100 UNIT/ML FlexTouch Pen Inject 10 Units into the skin daily.   No current facility-administered medications on file prior to visit.    Review of Systems  Constitutional:  Negative for activity change, appetite change, chills, diaphoresis, fatigue and fever.  HENT:  Negative for congestion and hearing loss.   Eyes:  Negative for visual disturbance.  Respiratory:  Negative for cough, chest tightness, shortness of breath and wheezing.   Cardiovascular:  Negative for chest pain, palpitations and leg swelling.  Gastrointestinal:  Negative for abdominal pain, constipation, diarrhea, nausea and vomiting.  Genitourinary:  Negative for dysuria, frequency and hematuria.  Musculoskeletal:  Negative for arthralgias and neck pain.  Skin:  Negative for rash.  Neurological:  Negative for dizziness, weakness, light-headedness, numbness and headaches.  Hematological:  Negative for adenopathy.  Psychiatric/Behavioral:  Negative for behavioral problems, dysphoric mood and sleep disturbance.    Per HPI unless specifically indicated above      Objective:    BP 136/68   Pulse 78   Ht 6' (1.829 m)   Wt (!) 352 lb (159.7 kg)   SpO2 96%   BMI 47.74 kg/m   Wt Readings from  Last 3 Encounters:  06/25/21 (!) 352 lb (159.7 kg)  06/21/21 (!) 348 lb (157.9 kg)  05/24/21 (!) 348 lb (157.9 kg)    Physical Exam Vitals and nursing note reviewed.  Constitutional:      General: He is not in acute distress.    Appearance: He is well-developed. He is obese. He is not diaphoretic.     Comments: Well-appearing, comfortable, cooperative  HENT:     Head: Normocephalic and atraumatic.  Eyes:  General:        Right eye: No discharge.        Left eye: No discharge.     Conjunctiva/sclera: Conjunctivae normal.     Pupils: Pupils are equal, round, and reactive to light.  Neck:     Thyroid: No thyromegaly.  Cardiovascular:     Rate and Rhythm: Normal rate and regular rhythm.     Pulses: Normal pulses.     Heart sounds: Normal heart sounds. No murmur heard. Pulmonary:     Effort: Pulmonary effort is normal. No respiratory distress.     Breath sounds: Normal breath sounds. No wheezing or rales.  Abdominal:     General: Bowel sounds are normal. There is no distension.     Palpations: Abdomen is soft. There is no mass.     Tenderness: There is no abdominal tenderness.  Musculoskeletal:        General: No tenderness. Normal range of motion.     Cervical back: Normal range of motion and neck supple.     Comments: Upper / Lower Extremities: - Normal muscle tone, strength bilateral upper extremities 5/5, lower extremities 5/5  Lymphadenopathy:     Cervical: No cervical adenopathy.  Skin:    General: Skin is warm and dry.     Findings: No erythema or rash.  Neurological:     Mental Status: He is alert and oriented to person, place, and time.     Comments: Distal sensation intact to light touch all extremities  Psychiatric:        Mood and Affect: Mood normal.        Behavior: Behavior normal.        Thought Content: Thought content normal.     Comments: Well groomed, good eye contact, normal speech and thoughts     Diabetic Foot Exam - Simple   Simple Foot  Form Diabetic Foot exam was performed with the following findings: Yes 06/25/2021  9:44 AM  Visual Inspection See comments: Yes Sensation Testing See comments: Yes Pulse Check Posterior Tibialis and Dorsalis pulse intact bilaterally: Yes Comments Pes planus bilateral, neuropathy with callus reduced sensation monofilament.      Results for orders placed or performed during the hospital encounter of 04/23/21  Glucose, capillary  Result Value Ref Range   Glucose-Capillary 124 (H) 70 - 99 mg/dL  Surgical pathology  Result Value Ref Range   SURGICAL PATHOLOGY      SURGICAL PATHOLOGY CASE: [id number] PATIENT: [name] Surgical Pathology Report     Specimen Submitted: A. Colon polyp, cecum; cold snare B. Colon polyp, descending; cold snare C. Colon polyp x2, sigmoid; cold snare  Clinical History: Colon cancer screening. Finding: Colon polyps     DIAGNOSIS: A.  COLON POLYP, CECUM; COLD SNARE: - TUBULAR ADENOMA, MULTIPLE FRAGMENTS. - NEGATIVE FOR HIGH-GRADE DYSPLASIA AND MALIGNANCY.  B.  COLON POLYP, DESCENDING; COLD SNARE: - TUBULAR ADENOMA. - NEGATIVE FOR HIGH-GRADE DYSPLASIA AND MALIGNANCY.  C.  COLON POLYP X 2, SIGMOID; COLD SNARE: - HYPERPLASTIC POLYPS, 2 FRAGMENTS, NEGATIVE FOR DYSPLASIA AND MALIGNANCY. - 2 MM FRAGMENT OF TUBULAR ADENOMA, NEGATIVE FOR HIGH-GRADE DYSPLASIA AND MALIGNANCY, SEE COMMENT.  Comment: In part C, there are 2 hyperplastic polyps, 4 mm each. There is also a 2 x 0.5 mm mucosal fragment with superficial adenomatous change, closely resembling the fragments in part A. Misplacem ent of the adenomatous tissue fragment is favored, which could have occurred at the time of procedure or during tissue processing. Correlation with the  clinical appearances is recommended.  GROSS DESCRIPTION: A. Labeled: Cold snare polyp cecum Received: Formalin Collection time: 1:52 PM on 04/23/2021 Placed into formalin time: 1:52 PM on 04/23/2021 Tissue  fragment(s): Multiple Size: Aggregate, 1.5 x 0.7 x 0.1 cm Description: Tan soft tissue fragments Entirely submitted in 1 cassette.  B. Labeled: Cold snare polyp descending colon Received: Formalin Collection time: 2:06 PM on 04/23/2021 Placed into formalin time: 2:06 PM on 04/23/2021 Tissue fragment(s): 1 Size: 0.5 x 0.3 x 0.1 cm Description: Tan soft tissue fragment Entirely submitted in 1 cassette.  C. Labeled: Cold snare polyp x2 sigmoid colon Received: Formalin Collection time: 2:12 PM on 04/23/2021 Placed into formalin time: 2:12 PM on 04/23/2021 Tissue fragment(s): Multiple Size: Aggregate, 2.0 x 0.5 x 0.1 cm D escription: Received are tan soft tissue fragments, admixed with intestinal debris.  The ratio of soft tissue to intestinal debris is 60: 40. Entirely submitted in 1 cassette.  CM 04/26/2021  Final Diagnosis performed by Bryan Lemma, MD.   Electronically signed 04/27/2021 5:21:25PM The electronic signature indicates that the named Attending Pathologist has evaluated the specimen Technical component performed at [facility], [address], [place], [place] [address] Lab: [phone] Dir: Rush Farmer, MD, MMM  Professional component performed at [facility], [facility], [place], [place], [place] [address] Lab: [phone] Dir: Kathi Simpers, MD       Assessment & Plan:   Problem List Items Addressed This Visit     Primary osteoarthritis involving multiple joints (Chronic)   Relevant Orders   COMPLETE METABOLIC PANEL WITH GFR   CBC with Differential/Platelet   Ambulatory referral to Physical Therapy   Diabetes mellitus (HCC) (Chronic)   Relevant Medications   atorvastatin (LIPITOR) 40 MG tablet   losartan (COZAAR) 25 MG tablet   metFORMIN (GLUCOPHAGE) 1000 MG tablet   OZEMPIC, 1 MG/DOSE, 4 MG/3ML SOPN   insulin degludec ([name]) 100 UNIT/ML FlexTouch Pen   Other Relevant Orders   Hemoglobin A1c   DDD (degenerative disc disease), lumbar  (Chronic)   Relevant Orders   Ambulatory referral to Physical Therapy   Chronic pain syndrome (Chronic)   Relevant Orders   COMPLETE METABOLIC PANEL WITH GFR   CBC with Differential/Platelet   Ambulatory referral to Physical Therapy   Hyperlipidemia associated with type 2 diabetes mellitus (HCC)   Relevant Medications   atorvastatin (LIPITOR) 40 MG tablet   hydrochlorothiazide (HYDRODIURIL) 25 MG tablet   losartan (COZAAR) 25 MG tablet   metFORMIN (GLUCOPHAGE) 1000 MG tablet   OZEMPIC, 1 MG/DOSE, 4 MG/3ML SOPN   insulin degludec ([name]) 100 UNIT/ML FlexTouch Pen   Other Relevant Orders   Lipid panel   Other Visit Diagnoses     Annual physical exam    -  Primary   Relevant Orders   COMPLETE METABOLIC PANEL WITH GFR   CBC with Differential/Platelet   Lipid panel   Hemoglobin A1c   Morbid obesity with BMI of 50.0-59.9, adult (HCC)       Relevant Medications   metFORMIN (GLUCOPHAGE) 1000 MG tablet   OZEMPIC, 1 MG/DOSE, 4 MG/3ML SOPN   insulin degludec ([name]) 100 UNIT/ML FlexTouch Pen   Other Relevant Orders   COMPLETE METABOLIC PANEL WITH GFR   Lipid panel   Nocturia       Relevant Orders   PSA   Chronic right hip pain       Relevant Orders   Ambulatory referral to Physical Therapy   Essential hypertension  Relevant Medications   atorvastatin (LIPITOR) 40 MG tablet   hydrochlorothiazide (HYDRODIURIL) 25 MG tablet   losartan (COZAAR) 25 MG tablet       Updated Health Maintenance information Fasting lab orders today Encouraged improvement to lifestyle with diet and exercise Goal of weight loss  Proceed with referral to [facility] PT ambulatory for improved mobility and back / leg pain.  Proceed w/ Pain Management as scheduled.  HTN Continue therapy as above.  DM REDUCE [name] from 20 units daily down to 10 units daily. Eventually come off of it. Increased Ozempic from 0.'5mg'$  up to '1mg'$  weekly, new order sent.   ------  Chronic bilateral diabetic  neuropathy in both feet, as complication of diabetes Additionally with bilateral foot deformity pes planus and neuropathy with callus Evidence of callus formation both feet, heels and mid foot  Completed DM Foot Exam today in office, 06/25/21 also with Podiatry. See exam note.  Plan - Proceed with ordering Diabetic Shoes, completed form today, will fax back to [facility] - Patient would benefit from Diabetic Shoes due to neuropathy with callus formation and pes planus, diabetes control is improving on current regimen, and I am continuing to monitor and manage diabetes.   Meds ordered this encounter  Medications   atorvastatin (LIPITOR) 40 MG tablet    Sig: Take 1 tablet (40 mg total) by mouth daily.    Dispense:  90 tablet    Refill:  3   hydrochlorothiazide (HYDRODIURIL) 25 MG tablet    Sig: Take 1 tablet (25 mg total) by mouth daily.    Dispense:  90 tablet    Refill:  3    Requesting 1 year supply   losartan (COZAAR) 25 MG tablet    Sig: Take 1 tablet (25 mg total) by mouth daily.    Dispense:  90 tablet    Refill:  3   metFORMIN (GLUCOPHAGE) 1000 MG tablet    Sig: Take 1 tablet (1,000 mg total) by mouth daily with breakfast.    Dispense:  90 tablet    Refill:  1   OZEMPIC, 1 MG/DOSE, 4 MG/3ML SOPN    Sig: Inject 1 mg into the skin once a week.    Dispense:  9 mL    Refill:  1      Follow up plan: Return in about 4 months (around 10/25/2021) for 4 month follow-up DM A1c.  Nobie Putnam, [facility] 06/25/2021, 9:36 AM

## 2021-06-26 LAB — LIPID PANEL
Cholesterol: 124 mg/dL (ref <200)
HDL: 43 mg/dL (ref >=40)
LDL Cholesterol (Calc): 62 mg/dL (calc)
Non-HDL Cholesterol (Calc): 81 mg/dL (calc) (ref <130)
Total CHOL/HDL Ratio: 2.9 (calc) (ref <5.0)
Triglycerides: 106 mg/dL (ref <150)

## 2021-06-26 LAB — COMPLETE METABOLIC PANEL WITH GFR
AG Ratio: 1.7 (calc) (ref 1.0–2.5)
ALT: 8 U/L — ABNORMAL LOW (ref 9–46)
AST: 10 U/L (ref 10–35)
Albumin: 3.7 g/dL (ref 3.6–5.1)
Alkaline phosphatase (APISO): 104 U/L (ref 35–144)
BUN/Creatinine Ratio: 18 (calc) (ref 6–22)
BUN: 12 mg/dL (ref 7–25)
CO2: 34 mmol/L — ABNORMAL HIGH (ref 20–32)
Calcium: 9 mg/dL (ref 8.6–10.3)
Chloride: 100 mmol/L (ref 98–110)
Creat: 0.67 mg/dL — ABNORMAL LOW (ref 0.70–1.30)
Globulin: 2.2 g/dL (calc) (ref 1.9–3.7)
Glucose, Bld: 82 mg/dL (ref 65–99)
Potassium: 4.1 mmol/L (ref 3.5–5.3)
Sodium: 142 mmol/L (ref 135–146)
Total Bilirubin: 0.4 mg/dL (ref 0.2–1.2)
Total Protein: 5.9 g/dL — ABNORMAL LOW (ref 6.1–8.1)
eGFR: 108 mL/min/{1.73_m2} (ref >=60)

## 2021-06-26 LAB — CBC WITH DIFFERENTIAL/PLATELET
Absolute Monocytes: 748 cells/uL (ref 200–950)
Basophils Absolute: 46 cells/uL (ref 0–200)
Basophils Relative: 0.4 %
Eosinophils Absolute: 345 cells/uL (ref 15–500)
Eosinophils Relative: 3 %
HCT: 47 % (ref 38.5–50.0)
Hemoglobin: 15.4 g/dL (ref 13.2–17.1)
Lymphs Abs: 2565 cells/uL (ref 850–3900)
MCH: 28.7 pg (ref 27.0–33.0)
MCHC: 32.8 g/dL (ref 32.0–36.0)
MCV: 87.7 fL (ref 80.0–100.0)
MPV: 8.7 fL (ref 7.5–12.5)
Monocytes Relative: 6.5 %
Neutro Abs: 7797 cells/uL (ref 1500–7800)
Neutrophils Relative %: 67.8 %
Platelets: 404 10*3/uL — ABNORMAL HIGH (ref 140–400)
RBC: 5.36 10*6/uL (ref 4.20–5.80)
RDW: 13.8 % (ref 11.0–15.0)
Total Lymphocyte: 22.3 %
WBC: 11.5 10*3/uL — ABNORMAL HIGH (ref 3.8–10.8)

## 2021-06-26 LAB — HEMOGLOBIN A1C
Hgb A1c MFr Bld: 6 % of total Hgb — ABNORMAL HIGH (ref <5.7)
Mean Plasma Glucose: 126 mg/dL
eAG (mmol/L): 7 mmol/L

## 2021-06-26 LAB — PSA: PSA: 0.46 ng/mL (ref <=4.00)

## 2021-06-27 ENCOUNTER — Other Ambulatory Visit: Payer: Self-pay | Admitting: Pain Medicine

## 2021-06-27 DIAGNOSIS — E559 Vitamin D deficiency, unspecified: Secondary | ICD-10-CM

## 2021-06-28 ENCOUNTER — Other Ambulatory Visit: Payer: Self-pay | Admitting: Family Medicine

## 2021-06-28 DIAGNOSIS — M159 Polyosteoarthritis, unspecified: Secondary | ICD-10-CM

## 2021-06-28 DIAGNOSIS — G894 Chronic pain syndrome: Secondary | ICD-10-CM

## 2021-06-28 NOTE — Telephone Encounter (Signed)
Medication Refill - Medication: pregabalin (LYRICA) 150 MG capsule  Needs 90 day supply he says   Has the patient contacted their pharmacy? Yes.   (Agent: If no, request that the patient contact the pharmacy for the refill. If patient does not wish to contact the pharmacy document the reason why and proceed with request.) (Agent: If yes, when and what did the pharmacy advise?)  Preferred Pharmacy (with phone number or street name):  [facility] [place] [address]  [place] [address]  Phone: [phone] Fax: [phone]   Has the patient been seen for an appointment in the last year OR does the patient have an upcoming appointment? Yes.    Agent: Please be advised that RX refills may take up to 3 business days. We ask that you follow-up with your pharmacy.

## 2021-06-29 MED ORDER — PREGABALIN 150 MG PO CAPS: ORAL_CAPSULE | ORAL | 1 refills | Status: DC

## 2021-06-29 NOTE — Telephone Encounter (Signed)
Requested medication (s) are due for refill today: Yes  Requested medication (s) are on the active medication list: Yes  Last refill:  03/16/21  Future visit scheduled: Yes  Notes to clinic:  Unable to refill per protocol, cannot delegate. Resend to another pharmacy      Requested Prescriptions  Pending Prescriptions Disp Refills   pregabalin (LYRICA) 150 MG capsule 270 capsule 1    Sig: Take 2 caps for '300mg'$  in AM, and take 1 cap for '150mg'$  at night     Not Delegated - Neurology:  Anticonvulsants - Controlled - pregabalin Failed - 06/28/2021  4:19 PM      Failed - This refill cannot be delegated      Failed - Cr in normal range and within 360 days    Creat  Date Value Ref Range Status  06/25/2021 0.67 (L) 0.70 - 1.30 mg/dL Final         Passed - Completed PHQ-2 or PHQ-9 in the last 360 days      Passed - Valid encounter within last 12 months    Recent Outpatient Visits           4 days ago Annual physical exam   [place], DO   4 months ago Pain and swelling of toe of left foot   [place], DO   5 months ago DDD (degenerative disc disease), lumbar   [place], DO   5 months ago Chronic pain syndrome   [place], DO   6 months ago Type 2 diabetes mellitus with other specified complication, with long-term current use of insulin ([place])   [facility] Parks Ranger, Devonne Doughty, DO       Future Appointments             In 3 months Parks Ranger, Devonne Doughty, DO [facility], [facility]

## 2021-07-13 ENCOUNTER — Telehealth: Payer: No Typology Code available for payment source

## 2021-07-14 ENCOUNTER — Other Ambulatory Visit: Payer: Self-pay | Admitting: Family Medicine

## 2021-07-14 ENCOUNTER — Ambulatory Visit: Payer: Self-pay | Admitting: *Deleted

## 2021-07-14 DIAGNOSIS — J9801 Acute bronchospasm: Secondary | ICD-10-CM

## 2021-07-14 MED ORDER — ALBUTEROL SULFATE HFA 108 (90 BASE) MCG/ACT IN AERS: 1.0000 | INHALATION_SPRAY | Freq: Four times a day (QID) | RESPIRATORY_TRACT | 5 refills | Status: DC | PRN

## 2021-07-14 NOTE — Telephone Encounter (Signed)
  Chief Complaint: yesterday SOB Symptoms: today ok Frequency: at random Pertinent Negatives: Patient denies symptoms today Disposition: '[]'$ ED /'[]'$ Urgent Care (no appt availability in office) / '[]'$ Appointment(In office/virtual)/ '[]'$  [place] [facility]/ '[]'$ Home Care/ '[]'$ Refused Recommended Disposition /'[]'$ [place] [facility]/ '[x]'$  Follow-up with PCP Additional Notes: Pt just seen by Dr. Raliegh Ip 06/25/21 and did not realize that Albuterol was not being ordered. He realizes now it must have been his PCP in [place] that ordered. Pt asking for a rx for Albuterol to use prn.   Answer Assessment - Initial Assessment Questions 1. NAME of MEDICATION: "What medicine are you calling about?"     Albuterol 2. QUESTION: "What is your question?" (e.g., double dose of medicine, side effect)     Used to take and did not tell Dr. Raliegh Ip at recent visit 3. PRESCRIBING HCP: "Who prescribed it?" Reason: if prescribed by specialist, call should be referred to that group.     Doctor in [place] 4. SYMPTOMS: "Do you have any symptoms?"     Yesterday could have used it but today better, want rx for incase 5. SEVERITY: If symptoms are present, ask "Are they mild, moderate or severe?"     Today mild, yesterday moderate 6. PREGNANCY:  "Is there any chance that you are pregnant?" "When was your last menstrual period?"     Na  Protocols used: Medication Question Call-A-AH

## 2021-07-15 ENCOUNTER — Telehealth: Payer: Self-pay | Admitting: Licensed Clinical Social Worker

## 2021-07-15 ENCOUNTER — Telehealth: Payer: Self-pay

## 2021-07-15 NOTE — Telephone Encounter (Signed)
    Clinical Social Work  Care Management   Phone Outreach    07/15/2021 Name: James [name] MRN: [id number] DOB: 08-19-1960  James [name] is a 60 y.o. year old male who is a primary care patient of Olin Hauser, DO .   Reason for referral: [place] and Resources.    F/U phone call today to assess needs, progress and barriers with care plan goals.   Telephone outreach was unsuccessful. A HIPPA compliant phone message was left for the patient providing contact information and requesting a return call.   Plan:CCM LCSW will wait for return call. If no return call is received, Will route chart to Care Guide to see if patient would like to reschedule phone appointment   Review of patient status, including review of consultants reports, relevant laboratory and other test results, and collaboration with appropriate care team members and the patient's provider was performed as part of comprehensive patient evaluation and provision of care management services.    Christa See, MSW, [facility] Care Management [email or web address] Phone [phone] 10:54 AM

## 2021-07-15 NOTE — Chronic Care Management (AMB) (Signed)
  Chronic Care Management Note  07/15/2021 Name: James [name] MRN: [id number] DOB: March 12, 1960  James [name] is a 61 y.o. year old male who is a primary care patient of Olin Hauser, DO and is actively engaged with the care management team. I reached out to [name] by phone today to assist with re-scheduling a follow up visit with the Licensed Clinical Social Worker  Follow up plan: Unsuccessful telephone outreach attempt made. A HIPAA compliant phone message was left for the patient providing contact information and requesting a return call.  The care management team will reach out to the patient again over the next 7 days.  If patient returns call to provider office, please advise to call [facility]  at [place], [place], [place], [place] [address] Direct Dial: [phone] [email or web address]: [email or web address]

## 2021-07-19 ENCOUNTER — Ambulatory Visit: Payer: Medicare HMO | Admitting: Pain Medicine

## 2021-07-19 NOTE — Progress Notes (Unsigned)
PROVIDER NOTE: Information contained herein reflects review and annotations entered in association with encounter. Interpretation of such information and data should be left to medically-trained personnel. Information provided to patient can be located elsewhere in the medical record under "Patient Instructions". Document created using STT-dictation technology, any transcriptional errors that may result from process are unintentional.    Patient: James [name]  Service Category: E/M  Provider: Gaspar Cola, MD  DOB: Sep 23, 1960  DOS: 07/21/2021  Specialty: Interventional Pain Management  MRN: [id number]  Setting: Ambulatory outpatient  PCP: Olin Hauser, DO  Type: Established Patient    Referring Provider: Nobie Putnam *  Location: Office  Delivery: Face-to-face     HPI  [name], a 61 y.o. year old male, is here today because of his Chronic pain syndrome [G89.4]. [name] primary complain today is No chief complaint on file. Last encounter: My last encounter with him was on 06/27/2021. Pertinent problems: Mr. Strothers has Chronic pain syndrome; DDD (degenerative disc disease), lumbar; Primary osteoarthritis involving multiple joints; Weakness of lower extremities (Bilateral); Chronic lower extremity pain (1ry area of Pain) (Bilateral); Chronic low back pain (4th area of Pain) (Bilateral) w/ sciatica (Bilateral); Chronic shoulder pain (5th area of Pain) (Left); Chronic hip pain (2ry area of Pain) (Bilateral); Chronic knee pain (3ry area of Pain) (Bilateral); Osteoarthritis of hips (Bilateral); Osteoarthritis of hip (Left); Osteoarthritis of hip (Right); Osteoarthritis of knee (Left); Lumbosacral radiculopathy at L5 (Left); Diabetic neuropathy ([place]); Osteoarthritis of AC (acromioclavicular) joint (Left); Osteoarthritis of shoulder (Left); Rotator cuff arthropathy of shoulder (Left); Lumbosacral facet hypertrophy (L4-5, L5-S1); Lumbar facet syndrome;  Abnormal MRI, hip (04/21/2021); 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 Pain Assessment: Severity of   is reported as a  /10. Location:    / . Onset:  . Quality:  . Timing:  . Modifying factor(s):  Marland Kitchen Vitals:  vitals were not taken for this visit.   Reason for encounter: medication management. ***  RTCB: 10/26/2021  Pharmacotherapy Assessment  Analgesic: Oxycodone/APAP 10/325 (# 60/month), 1 tab p.o. twice daily (last filled on 02/03/2021) Needs to loose an average of 2.75 lbs/mo to be considered for opioid pharmacotherapy.  MME/day: 30 mg/day   Monitoring: [place] PMP: PDMP reviewed during this encounter.       Pharmacotherapy: No side-effects or adverse reactions reported. Compliance: No problems identified. Effectiveness: Clinically acceptable.  No notes on file  UDS:  Summary  Date Value Ref Range Status  02/15/2021 Note  Final    Comment:    ==================================================================== Compliance Drug Analysis, Ur ==================================================================== Test                             Result       Flag       Units  Drug Present and Declared for Prescription Verification   Oxycodone                      3020         EXPECTED   ng/mg creat   Oxymorphone                    1094         EXPECTED   ng/mg creat   Noroxycodone  3969         EXPECTED   ng/mg creat   Noroxymorphone                 265          EXPECTED   ng/mg creat    Sources of oxycodone are scheduled prescription medications.    Oxymorphone, noroxycodone, and noroxymorphone are expected    metabolites of oxycodone. Oxymorphone is also available as a    scheduled prescription medication.    Pregabalin                     PRESENT       EXPECTED   Acetaminophen                  PRESENT      EXPECTED ==================================================================== Test                      Result    Flag   Units      Ref Range   Creatinine              172              mg/dL      >=20 ==================================================================== Declared Medications:  The flagging and interpretation on this report are based on the  following declared medications.  Unexpected results may arise from  inaccuracies in the declared medications.   **Note: The testing scope of this panel includes these medications:   Oxycodone (Percocet)  Pregabalin (Lyrica)   **Note: The testing scope of this panel does not include small to  moderate amounts of these reported medications:   Acetaminophen (Percocet)   **Note: The testing scope of this panel does not include the  following reported medications:   Atorvastatin (Lipitor)  Hydrochlorothiazide (Hydrodiuril)  Insulin Tyler Aas)  Losartan (Cozaar)  Metformin (Glucophage)  Semaglutide (Ozempic) ==================================================================== For clinical consultation, please call [phone]. ====================================================================      ROS  Constitutional: Denies any fever or chills Gastrointestinal: No reported hemesis, hematochezia, vomiting, or acute GI distress Musculoskeletal: Denies any acute onset joint swelling, redness, loss of ROM, or weakness Neurological: No reported episodes of acute onset apraxia, aphasia, dysarthria, agnosia, amnesia, paralysis, loss of coordination, or loss of consciousness  Medication Review  Oxycodone HCl, Semaglutide (1 MG/DOSE), albuterol, atorvastatin, hydrochlorothiazide, insulin degludec, losartan, metFORMIN, pregabalin, and varenicline  History Review  Allergy: [name] has No Known Allergies. Drug: [name]  reports no history of drug use. Alcohol:   reports no history of alcohol use. Tobacco:  reports that he has been smoking cigarettes. He has a 2.50 pack-year smoking history. He has never used smokeless tobacco. Social: [name]  reports that he has been smoking cigarettes. He has a 2.50 pack-year smoking history. He has never used smokeless tobacco. He reports that he does not drink alcohol and does not use drugs. Medical:  has a past medical history of Arthritis, Diabetes mellitus without complication ([place]), Hyperlipidemia, and Hypertension. Surgical: [name]  has a past surgical history that includes Colonoscopy with propofol; Appendectomy; and Colonoscopy with propofol (N/A, 04/23/2021). Family: family history includes Arthritis in his father and mother; Heart disease in his mother.  Laboratory Chemistry Profile   Renal Lab Results  Component Value Date   BUN 12 06/25/2021   CREATININE 0.67 (L) 06/25/2021   BCR 18 06/25/2021   GFRNONAA >60 10/30/2020    Hepatic Lab Results  Component Value Date  AST 10 06/25/2021   ALT 8 (L) 06/25/2021   ALBUMIN 4.1 02/15/2021   ALKPHOS 116 02/15/2021    Electrolytes Lab Results  Component Value Date   NA 142 06/25/2021   K 4.1 06/25/2021   CL 100 06/25/2021   CALCIUM 9.0 06/25/2021   MG 1.8 02/15/2021    Bone Lab Results  Component Value Date   25OHVITD1 13 (L) 02/15/2021   25OHVITD2 <1.0 02/15/2021   25OHVITD3 12 02/15/2021    Inflammation (CRP: Acute Phase) (ESR: Chronic Phase) Lab Results  Component Value Date   CRP 29 (H) 02/15/2021   ESRSEDRATE 39 (H) 02/15/2021         Note: Above Lab results reviewed.  Recent Imaging Review  DG PAIN CLINIC C-ARM 1-60 MIN NO REPORT Fluoro was used, but no Radiologist interpretation will be provided.  Please refer to "NOTES" tab for provider progress note. Note: Reviewed        Physical Exam  General appearance: Well nourished, well developed, and well hydrated. In no apparent acute distress Mental status: Alert,  oriented x 3 (person, place, & time)       Respiratory: No evidence of acute respiratory distress Eyes: PERLA Vitals: There were no vitals taken for this visit. BMI: Estimated body mass index is 47.74 kg/m as calculated from the following:   Height as of 06/25/21: 6' (1.829 m).   Weight as of 06/25/21: 352 lb (159.7 kg). Ideal: Patient weight not recorded  Assessment   Diagnosis Status  1. Chronic pain syndrome   2. Chronic lower extremity pain (1ry area of Pain) (Bilateral)   3. 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 Chronic shoulder pain (3ry area of Pain) (Left)   5. Primary osteoarthritis of hips (Bilateral)   6. Osteoarthritis of hip (Left)   7. Osteoarthritis of hip (Right)   8. Osteoarthritis of knee (Left)   9. Pharmacologic therapy   10. Chronic use of opiate for therapeutic purpose   11. Encounter for medication management   12. Encounter for chronic pain management    Controlled Controlled Controlled   Updated Problems: No problems updated.  Plan of Care  Problem-specific:  No problem-specific Assessment & Plan notes found for this encounter.  [name] has a current medication list which includes the following long-term medication(s): albuterol, atorvastatin, hydrochlorothiazide, losartan, metformin, oxycodone hcl, and pregabalin.  Pharmacotherapy (Medications Ordered): No orders of the defined types were placed in this encounter.  Orders:  No orders of the defined types were placed in this encounter.  Follow-up plan:   No follow-ups on file.     Interventional Therapies  Risk  Complexity Considerations:   Estimated body mass index is 50.97 kg/m as calculated from the following:   Height as of this encounter: 6' (1.829 m).   Weight as of this encounter: 375 lb 12.8 oz (170.5 kg). WNL   Planned  Pending:   Diagnostic/therapeutic right L4-5 LESI #1  Notes will be requested from the [place] and  [place].   Under consideration:   Diagnostic/therapeutic right L4-5 LESI #1  Diagnostic/therapeutic left IA AC + glenohumeral joint inj. #1  Diagnostic/therapeutic left suprascapular NB #1  Diagnostic/therapeutic bilateral IA hip inj. #1  Diagnostic/therapeutic bilateral IA steroid knee inj. #1  Therapeutic bilateral IA viscosupplementation knee inj. #1  Diagnostic/therapeutic bilateral lumbar facet MBB #1    Completed:   Toradol/Norflex IM 60/60 mg (03/29/2021)  Referral to orthopedic surgery  for evaluation of knees and hip (03/29/2021)  Referral to medical weight management (02/15/2021)  Referral to bariatric surgery (02/15/2021)    Completed by other providers:   [facility]  EMG/PNCV (LE) by Gurney Maxin, MD [facility] neurology) (03/16/2021) (chronic, severe sensorimotor polyneuropathy)   Therapeutic  Palliative (PRN) options:   None established     Recent Visits Date Type Provider Dept  06/21/21 Procedure visit Gillis Santa, MD [facility]  05/24/21 Office Visit Milinda Pointer, MD [facility]  04/29/21 Office Visit Milinda Pointer, MD [facility]  Showing recent visits within past 90 days and meeting all other requirements Future Appointments Date Type Provider Dept  07/21/21 Appointment Milinda Pointer, MD [facility]  Showing future appointments within next 90 days and meeting all other requirements  I discussed the assessment and treatment plan with the patient. The patient was provided an opportunity to ask questions and all were answered. The patient agreed with the plan and demonstrated an understanding of the instructions.  Patient advised to call back or seek an in-person evaluation if the symptoms or condition worsens.  Duration of encounter: *** minutes.  Total time on encounter, as per AMA guidelines included both the face-to-face and non-face-to-face time  personally spent by the physician and/or other qualified health care professional(s) on the day of the encounter (includes time in activities that require the physician or other qualified health care professional and does not include time in activities normally performed by clinical staff). Physician's time may include the following activities when performed: preparing to see the patient (eg, review of tests, pre-charting review of records) obtaining and/or reviewing separately obtained history performing a medically appropriate examination and/or evaluation counseling and educating the patient/family/caregiver ordering medications, tests, or procedures referring and communicating with other health care professionals (when not separately reported) documenting clinical information in the electronic or other health record independently interpreting results (not separately reported) and communicating results to the patient/ family/caregiver care coordination (not separately reported)  Note by: Gaspar Cola, MD Date: 07/21/2021; Time: 7:03 AM

## 2021-07-19 NOTE — Chronic Care Management (AMB) (Signed)
  Chronic Care Management Note  07/19/2021 Name: James [name] MRN: [id number] DOB: 28-Dec-1960  James [name] is a 61 y.o. year old male who is a primary care patient of Olin Hauser, DO and is actively engaged with the care management team. I reached out to James [name] by phone today to assist with re-scheduling a follow up visit with the Licensed Clinical Social Worker  Follow up plan: Telephone appointment with care management team member scheduled for:07/27/2021  Noreene Larsson, [place], [place], [place] [address] Direct Dial: [phone] [email or web address]: [email or web address]

## 2021-07-21 ENCOUNTER — Ambulatory Visit: Payer: Medicare HMO | Attending: Pain Medicine | Admitting: Pain Medicine

## 2021-07-21 ENCOUNTER — Encounter: Payer: Self-pay | Admitting: Pain Medicine

## 2021-07-21 VITALS — BP 138/99 | HR 90 | Temp 97.4°F | Resp 18 | Ht 72.0 in | Wt 343.0 lb

## 2021-07-21 DIAGNOSIS — Z79891 Long term (current) use of opiate analgesic: Secondary | ICD-10-CM | POA: Diagnosis present

## 2021-07-21 DIAGNOSIS — M5442 Lumbago with sciatica, left side: Secondary | ICD-10-CM

## 2021-07-21 DIAGNOSIS — G894 Chronic pain syndrome: Secondary | ICD-10-CM

## 2021-07-21 DIAGNOSIS — M79604 Pain in right leg: Secondary | ICD-10-CM | POA: Diagnosis not present

## 2021-07-21 DIAGNOSIS — R6889 Other general symptoms and signs: Secondary | ICD-10-CM | POA: Diagnosis not present

## 2021-07-21 DIAGNOSIS — M5441 Lumbago with sciatica, right side: Secondary | ICD-10-CM

## 2021-07-21 DIAGNOSIS — M25512 Pain in left shoulder: Secondary | ICD-10-CM

## 2021-07-21 DIAGNOSIS — G8929 Other chronic pain: Secondary | ICD-10-CM | POA: Diagnosis not present

## 2021-07-21 DIAGNOSIS — M160 Bilateral primary osteoarthritis of hip: Secondary | ICD-10-CM | POA: Diagnosis not present

## 2021-07-21 DIAGNOSIS — M1612 Unilateral primary osteoarthritis, left hip: Secondary | ICD-10-CM

## 2021-07-21 DIAGNOSIS — Z79899 Other long term (current) drug therapy: Secondary | ICD-10-CM

## 2021-07-21 DIAGNOSIS — M79605 Pain in left leg: Secondary | ICD-10-CM | POA: Diagnosis not present

## 2021-07-21 DIAGNOSIS — M1712 Unilateral primary osteoarthritis, left knee: Secondary | ICD-10-CM | POA: Diagnosis not present

## 2021-07-21 DIAGNOSIS — M1611 Unilateral primary osteoarthritis, right hip: Secondary | ICD-10-CM

## 2021-07-21 MED ORDER — OXYCODONE HCL 10 MG PO TABS: 10.0000 mg | ORAL_TABLET | Freq: Two times a day (BID) | ORAL | 0 refills | Status: DC

## 2021-07-21 NOTE — Patient Instructions (Signed)
______________________________________________________________________________________________  Body mass index (BMI)  Body mass index (BMI) is a common tool for deciding whether a person has an appropriate body weight.  It measures a persons weight in relation to their height.   According to the [facility] of health (NIH): A BMI of less than 18.5 means that a person is underweight. A BMI of between 18.5 and 24.9 is ideal. A BMI of between 25 and 29.9 is overweight. A BMI over 30 indicates obesity.  Weight Management Required  URGENT: Your weight has been found to be adversely affecting your health.  Dear Mr. Housand:  Your current Estimated body mass index is 46.52 kg/m as calculated from the following:   Height as of this encounter: 6' (1.829 m).   Weight as of this encounter: 343 lb (155.6 kg).  Please use the table below to identify your weight category and associated incidence of chronic pain, secondary to your weight.  Body Mass Index (BMI) Classification BMI level (kg/m2) Category Associated incidence of chronic pain  <18  Underweight   18.5-24.9 Ideal body weight   25-29.9 Overweight  20%  30-34.9 Obese (Class I)  68%  35-39.9 Severe obesity (Class II)  136%  >40 Extreme obesity (Class III)  254%   In addition: You will be considered "Morbidly Obese", if your BMI is above 30 and you have one or more of the following conditions which are known to be caused and/or directly associated with obesity: 1.    Type 2 Diabetes (Which in turn can lead to cardiovascular diseases (CVD), stroke, peripheral vascular diseases (PVD), retinopathy, nephropathy, and neuropathy) 2.    Cardiovascular Disease (High Blood Pressure; Congestive Heart Failure; High Cholesterol; Coronary Artery Disease; Angina; or History of Heart Attacks) 3.    Breathing problems (Asthma; obesity-hypoventilation syndrome; obstructive sleep apnea; chronic inflammatory airway disease; reactive airway  disease; or shortness of breath) 4.    Chronic kidney disease 5.    Liver disease (nonalcoholic fatty liver disease) 6.    High blood pressure 7.    Acid reflux (gastroesophageal reflux disease; heartburn) 8.    Osteoarthritis (OA) (with any of the following: hip pain; knee pain; and/or low back pain) 9.    Low back pain (Lumbar Facet Syndrome; and/or Degenerative Disc Disease) 10.  Hip pain (Osteoarthritis of hip) (For every 1 lbs of added body weight, there is a 2 lbs increase in pressure inside of each hip articulation. 1:2 mechanical relationship) 11.  Knee pain (Osteoarthritis of knee) (For every 1 lbs of added body weight, there is a 4 lbs increase in pressure inside of each knee articulation. 1:4 mechanical relationship) (patients with a BMI>30 kg/m2 were 6.8 times more likely to develop knee OA than normal-weight individuals) 12.  Cancer: Epidemiological studies have shown that obesity is a risk factor for: post-menopausal breast cancer; cancers of the endometrium, colon and kidney cancer; malignant adenomas of the oesophagus. Obese subjects have an approximately 1.5-3.5-fold increased risk of developing these cancers compared with normal-weight subjects, and it has been estimated that between 15 and 45% of these cancers can be attributed to overweight. More recent studies suggest that obesity may also increase the risk of other types of cancer, including pancreatic, hepatic and gallbladder cancer. (Ref: Obesity and cancer. Pischon T, Nthlings U, [employer] H. Proc Nutr Soc. 2008 May;67(2):128-45. doi: 37.8588/F0277412878676720.) The International Agency for Research on Cancer (IARC) has identified 13 cancers associated with overweight and obesity: meningioma, multiple myeloma, adenocarcinoma of the esophagus, and cancers of the  thyroid, postmenopausal breast cancer, gallbladder, stomach, liver, pancreas, kidney, ovaries, uterus, colon and rectal (colorectal) cancers. 56 percent of all cancers  diagnosed in women and 24 percent of those diagnosed in men are associated with overweight and obesity.  Recommendation: At this point it is urgent that you take a step back and concentrate in loosing weight. Dedicate 100% of your efforts on this task. Nothing else will improve your health more than bringing your weight down and your BMI to less than 30. If you are here, you probably have chronic pain. We know that most chronic pain patients have difficulty exercising secondary to their pain. For this reason, you must rely on proper nutrition and diet in order to lose the weight. If your BMI is above 40, you should seriously consider bariatric surgery. A realistic goal is to lose 10% of your body weight over a period of 12 months.  Be honest to yourself, if over time you have unsuccessfully tried to lose weight, then it is time for you to seek professional help and to enter a medically supervised weight management program, and/or undergo bariatric surgery. Stop procrastinating.   Pain management considerations:  1.    Pharmacological Problems: Be advised that the use of opioid analgesics (oxycodone; hydrocodone; morphine; methadone; codeine; and all of their derivatives) have been associated with decreased metabolism and weight gain.  For this reason, should we see that you are unable to lose weight while taking these medications, it may become necessary for [place] to taper down and indefinitely discontinue them.  2.    Technical Problems: The incidence of successful interventional therapies decreases as the patient's BMI increases. It is much more difficult to accomplish a safe and effective interventional therapy on a patient with a BMI above 35. 3.    Radiation Exposure Problems: The x-rays machine, used to accomplish injection therapies, will automatically increase their x-ray output in order to capture an appropriate bone image. This means that radiation exposure increases exponentially with the patient's  BMI. (The higher the BMI, the higher the radiation exposure.) Although the level of radiation used at a given time is still safe to the patient, it is not for the physician and/or assisting staff. Unfortunately, radiation exposure is accumulative. Because physicians and the staff have to do procedures and be exposed on a daily basis, this can result in health problems such as cancer and radiation burns. Radiation exposure to the staff is monitored by the radiation batches that they wear. The exposure levels are reported back to the staff on a quarterly basis. Depending on levels of exposure, physicians and staff may be obligated by law to decrease this exposure. This means that they have the right and obligation to refuse providing therapies where they may be overexposed to radiation. For this reason, physicians may decline to offer therapies such as radiofrequency ablation or implants to patients with a BMI above 40. 4.    Current Trends: Be advised that the current trend is to no longer offer certain therapies to patients with a BMI equal to, or above 35, due to increase perioperative risks, increased technical procedural difficulties, and excessive radiation exposure to healthcare personnel.  ______________________________________________________________________________________________   ____________________________________________________________________________________________  Medication Rules  Purpose: To inform patients, and their family members, of our rules and regulations.  Applies to: All patients receiving prescriptions (written or electronic).  Pharmacy of record: Pharmacy where electronic prescriptions will be sent. If written prescriptions are taken to a different pharmacy, please inform the nursing staff.  The pharmacy listed in the electronic medical record should be the one where you would like electronic prescriptions to be sent.  Electronic prescriptions: In compliance with the  [place] (STOP) Act of 2017 (Session Lanny Cramp [phone]), effective January 17, 2018, all controlled substances must be electronically prescribed. Calling prescriptions to the pharmacy will cease to exist.  Prescription refills: Only during scheduled appointments. Applies to all prescriptions.  NOTE: The following applies primarily to controlled substances (Opioid* Pain Medications).   Type of encounter (visit): For patients receiving controlled substances, face-to-face visits are required. (Not an option or up to the patient.)  Patient's responsibilities: Pain Pills: Bring all pain pills to every appointment (except for procedure appointments). Pill Bottles: Bring pills in original pharmacy bottle. Always bring the newest bottle. Bring bottle, even if empty. Medication refills: You are responsible for knowing and keeping track of what medications you take and those you need refilled. The day before your appointment: write a list of all prescriptions that need to be refilled. The day of the appointment: give the list to the admitting nurse. Prescriptions will be written only during appointments. No prescriptions will be written on procedure days. If you forget a medication: it will not be "Called in", "Faxed", or "electronically sent". You will need to get another appointment to get these prescribed. No early refills. Do not call asking to have your prescription filled early. Prescription Accuracy: You are responsible for carefully inspecting your prescriptions before leaving our office. Have the discharge nurse carefully go over each prescription with you, before taking them home. Make sure that your name is accurately spelled, that your address is correct. Check the name and dose of your medication to make sure it is accurate. Check the number of pills, and the written instructions to make sure they are clear and accurate. Make sure that you are given enough  medication to last until your next medication refill appointment. Taking Medication: Take medication as prescribed. When it comes to controlled substances, taking less pills or less frequently than prescribed is permitted and encouraged. Never take more pills than instructed. Never take medication more frequently than prescribed.  Inform other Doctors: Always inform, all of your healthcare providers, of all the medications you take. Pain Medication from other Providers: You are not allowed to accept any additional pain medication from any other Doctor or Healthcare provider. There are two exceptions to this rule. (see below) In the event that you require additional pain medication, you are responsible for notifying us, as stated below. Cough Medicine: Often these contain an opioid, such as codeine or hydrocodone. Never accept or take cough medicine containing these opioids if you are already taking an opioid* medication. The combination may cause respiratory failure and death. Medication Agreement: You are responsible for carefully reading and following our Medication Agreement. This must be signed before receiving any prescriptions from our practice. Safely store a copy of your signed Agreement. Violations to the Agreement will result in no further prescriptions. (Additional copies of our Medication Agreement are available upon request.) Laws, Rules, & Regulations: All patients are expected to follow all Federal and [employer], [employer], Rules, [employer]. Ignorance of the Laws does not constitute a valid excuse.  Illegal drugs and Controlled Substances: The use of illegal substances (including, but not limited to marijuana and its derivatives) and/or the illegal use of any controlled substances is strictly prohibited. Violation of this rule may result in the immediate and permanent discontinuation of any  and all prescriptions being written by our practice. The use of any illegal substances is  prohibited. Adopted CDC guidelines & recommendations: Target dosing levels will be at or below 60 MME/day. Use of benzodiazepines** is not recommended.  Exceptions: There are only two exceptions to the rule of not receiving pain medications from other Healthcare Providers. Exception #1 (Emergencies): In the event of an emergency (i.e.: accident requiring emergency care), you are allowed to receive additional pain medication. However, you are responsible for: As soon as you are able, call our office [phone], at any time of the day or night, and leave a message stating your name, the date and nature of the emergency, and the name and dose of the medication prescribed. In the event that your call is answered by a member of our staff, make sure to document and save the date, time, and the name of the person that took your information.  Exception #2 (Planned Surgery): In the event that you are scheduled by another doctor or dentist to have any type of surgery or procedure, you are allowed (for a period no longer than 30 days), to receive additional pain medication, for the acute post-op pain. However, in this case, you are responsible for picking up a copy of our "Post-op Pain Management for Surgeons" handout, and giving it to your surgeon or dentist. This document is available at our office, and does not require an appointment to obtain it. Simply go to our office during business hours (Monday-Thursday from 8:00 AM to 4:00 PM) (Friday 8:00 AM to 12:00 Noon) or if you have a scheduled appointment with [place], prior to your surgery, and ask for it by name. In addition, you are responsible for: calling our office [phone], at any time of the day or night, and leaving a message stating your name, name of your surgeon, type of surgery, and date of procedure or surgery. Failure to comply with your responsibilities may result in termination of therapy involving the controlled substances. Medication Agreement  Violation. Following the above rules, including your responsibilities will help you in avoiding a Medication Agreement Violation ("Breaking your Pain Medication Contract").  *Opioid medications include: morphine, codeine, oxycodone, oxymorphone, hydrocodone, hydromorphone, meperidine, tramadol, tapentadol, buprenorphine, fentanyl, methadone. **Benzodiazepine medications include: diazepam (Valium), alprazolam (Xanax), clonazepam (Klonopine), lorazepam (Ativan), clorazepate (Tranxene), chlordiazepoxide (Librium), estazolam (Prosom), oxazepam (Serax), temazepam (Restoril), triazolam (Halcion) (Last updated: 10/14/2020) ____________________________________________________________________________________________  ____________________________________________________________________________________________  Medication Recommendations and Reminders  Applies to: All patients receiving prescriptions (written and/or electronic).  Medication Rules & Regulations: These rules and regulations exist for your safety and that of others. They are not flexible and neither are we. Dismissing or ignoring them will be considered "non-compliance" with medication therapy, resulting in complete and irreversible termination of such therapy. (See document titled "Medication Rules" for more details.) In all conscience, because of safety reasons, we cannot continue providing a therapy where the patient does not follow instructions.  Pharmacy of record:  Definition: This is the pharmacy where your electronic prescriptions will be sent.  We do not endorse any particular pharmacy, however, we have experienced problems with [facility] not securing enough medication supply for the community. We do not restrict you in your choice of pharmacy. However, once we write for your prescriptions, we will NOT be re-sending more prescriptions to fix restricted supply problems created by your pharmacy, or your insurance.  The pharmacy listed in  the electronic medical record should be the one where you want electronic prescriptions to be sent.  If you choose to change pharmacy, simply notify our nursing staff.  Recommendations: Keep all of your pain medications in a safe place, under lock and key, even if you live alone. We will NOT replace lost, stolen, or damaged medication. After you fill your prescription, take 1 week's worth of pills and put them away in a safe place. You should keep a separate, properly labeled bottle for this purpose. The remainder should be kept in the original bottle. Use this as your primary supply, until it runs out. Once it's gone, then you know that you have 1 week's worth of medicine, and it is time to come in for a prescription refill. If you do this correctly, it is unlikely that you will ever run out of medicine. To make sure that the above recommendation works, it is very important that you make sure your medication refill appointments are scheduled at least 1 week before you run out of medicine. To do this in an effective manner, make sure that you do not leave the office without scheduling your next medication management appointment. Always ask the nursing staff to show you in your prescription , when your medication will be running out. Then arrange for the receptionist to get you a return appointment, at least 7 days before you run out of medicine. Do not wait until you have 1 or 2 pills left, to come in. This is very poor planning and does not take into consideration that we may need to cancel appointments due to bad weather, sickness, or emergencies affecting our staff. DO NOT ACCEPT A "Partial Fill": If for any reason your pharmacy does not have enough pills/tablets to completely fill or refill your prescription, do not allow for a "partial fill". The law allows the pharmacy to complete that prescription within 72 hours, without requiring a new prescription. If they do not fill the rest of your prescription  within those 72 hours, you will need a separate prescription to fill the remaining amount, which we will NOT provide. If the reason for the partial fill is your insurance, you will need to talk to the pharmacist about payment alternatives for the remaining tablets, but again, DO NOT ACCEPT A PARTIAL FILL, unless you can trust your pharmacist to obtain the remainder of the pills within 72 hours.  Prescription refills and/or changes in medication(s):  Prescription refills, and/or changes in dose or medication, will be conducted only during scheduled medication management appointments. (Applies to both, written and electronic prescriptions.) No refills on procedure days. No medication will be changed or started on procedure days. No changes, adjustments, and/or refills will be conducted on a procedure day. Doing so will interfere with the diagnostic portion of the procedure. No phone refills. No medications will be "called into the pharmacy". No Fax refills. No weekend refills. No Holliday refills. No after hours refills.  Remember:  Business hours are:  Monday to Thursday 8:00 AM to 4:00 PM Provider's Schedule: Milinda Pointer, MD - Appointments are:  Medication management: Monday and Wednesday 8:00 AM to 4:00 PM Procedure day: Tuesday and Thursday 7:30 AM to 4:00 PM Gillis Santa, MD - Appointments are:  Medication management: Tuesday and Thursday 8:00 AM to 4:00 PM Procedure day: Monday and Wednesday 7:30 AM to 4:00 PM (Last update: 08/07/2019) ____________________________________________________________________________________________  ____________________________________________________________________________________________  CBD (cannabidiol) & Delta-8 (Delta-8 tetrahydrocannabinol) WARNING  Intro: Cannabidiol (CBD) and tetrahydrocannabinol (THC), are two natural compounds found in plants of the Cannabis genus. They can both be extracted from hemp  or cannabis. Hemp and cannabis come  from the Cannabis sativa plant. Both compounds interact with your body's endocannabinoid system, but they have very different effects. CBD does not produce the high sensation associated with cannabis. Delta-8 tetrahydrocannabinol, also known as delta-8 THC, is a psychoactive substance found in the Cannabis sativa plant, of which marijuana and hemp are two varieties. THC is responsible for the high associated with the illicit use of marijuana.  Applicable to: All individuals currently taking or considering taking CBD (cannabidiol) and, more important, all patients taking opioid analgesic controlled substances (pain medication). (Example: oxycodone; oxymorphone; hydrocodone; hydromorphone; morphine; methadone; tramadol; tapentadol; fentanyl; buprenorphine; butorphanol; dextromethorphan; meperidine; codeine; etc.)  Legal status: CBD remains a Schedule I drug prohibited for any use. CBD is illegal with one exception. In the [place], CBD has a limited Transport planner (FDA) approval for the treatment of two specific types of epilepsy disorders. Only one CBD product has been approved by the FDA for this purpose: "Epidiolex". FDA is aware that some companies are marketing products containing cannabis and cannabis-derived compounds in ways that violate the [employer], Drug and Cosmetic Act [facility] Act) and that may put the health and safety of consumers at risk. The FDA, a Federal agency, has not enforced the CBD status since 2018. UPDATE: (03/05/2021) The Drug Enforcement Agency ([place]) issued a letter stating that "delta" cannabinoids, including Delta-8-THCO and Delta-9-THCO, synthetically derived from hemp do not qualify as hemp and will be viewed as Schedule I drugs. (Schedule I drugs, substances, or chemicals are defined as drugs with no currently accepted medical use and a high potential for abuse. Some examples of Schedule I drugs are: heroin, lysergic acid diethylamide (LSD), marijuana  (cannabis), 3,4-methylenedioxymethamphetamine (ecstasy), methaqualone, and peyote.) (https://jennings.com/)  Legality: Some manufacturers ship CBD products nationally, which is illegal. Often such products are sold online and are therefore available throughout the country. CBD is openly sold in head shops and health food stores in some states where such sales have not been explicitly legalized. Selling unapproved products with unsubstantiated therapeutic claims is not only a violation of the law, but also can put patients at risk, as these products have not been proven to be safe or effective. Federal illegality makes it difficult to conduct research on CBD.  Reference: "FDA Regulation of Cannabis and Cannabis-Derived Products, Including Cannabidiol (CBD)" - [employer].pt  Warning: CBD is not FDA approved and has not undergo the same manufacturing controls as prescription drugs.  This means that the purity and safety of available CBD may be questionable. Most of the time, despite manufacturer's claims, it is contaminated with THC (delta-9-tetrahydrocannabinol - the chemical in marijuana responsible for the "HIGH").  When this is the case, the [facility] contaminant will trigger a positive urine drug screen (UDS) test for Marijuana (carboxy-THC). Because a positive UDS for any illicit substance is a violation of our medication agreement, your opioid analgesics (pain medicine) may be permanently discontinued. The FDA recently put out a warning about 5 things that everyone should be aware of regarding Delta-8 THC: Delta-8 THC products have not been evaluated or approved by the FDA for safe use and may be marketed in ways that put the public health at risk. The FDA has received adverse event reports involving delta-8 THC-containing products. Delta-8 THC has psychoactive and intoxicating  effects. Delta-8 THC manufacturing often involve use of potentially harmful chemicals to create the concentrations of delta-8 THC claimed in the marketplace. The final delta-8 THC product may have potentially harmful  by-products (contaminants) due to the chemicals used in the process. Manufacturing of delta-8 THC products may occur in uncontrolled or unsanitary settings, which may lead to the presence of unsafe contaminants or other potentially harmful substances. Delta-8 THC products should be kept out of the reach of children and pets.  MORE ABOUT CBD  General Information: CBD was discovered in [birth year] and it is a derivative of the cannabis sativa genus plants (Marijuana and Hemp). It is one of the 113 identified substances found in Marijuana. It accounts for up to 40% of the plant's extract. As of 2018, preliminary clinical studies on CBD included research for the treatment of anxiety, movement disorders, and pain. CBD is available and consumed in multiple forms, including inhalation of smoke or vapor, as an aerosol spray, and by mouth. It may be supplied as an oil containing CBD, capsules, dried cannabis, or as a liquid solution. CBD is thought not to be as psychoactive as THC (delta-9-tetrahydrocannabinol - the chemical in marijuana responsible for the "HIGH"). Studies suggest that CBD may interact with different biological target receptors in the body, including cannabinoid and other neurotransmitter receptors. As of 2018 the mechanism of action for its biological effects has not been determined.  Side-effects  Adverse reactions: Dry mouth, diarrhea, decreased appetite, fatigue, drowsiness, malaise, weakness, sleep disturbances, and others.  Drug interactions: CBC may interact with other medications such as blood-thinners. Because CBD causes drowsiness on its own, it also increases the drowsiness caused by other medications, including antihistamines (such as Benadryl), benzodiazepines (Xanax, Ativan,  Valium), antipsychotics, antidepressants and opioids, as well as alcohol and supplements such as kava, melatonin and St. John's Wort. Be cautious with the following combinations:   Brivaracetam (Briviact) Brivaracetam is changed and broken down by the body. CBD might decrease how quickly the body breaks down brivaracetam. This might increase levels of brivaracetam in the body.  Caffeine Caffeine is changed and broken down by the body. CBD might decrease how quickly the body breaks down caffeine. This might increase levels of caffeine in the body.  Carbamazepine (Tegretol) Carbamazepine is changed and broken down by the body. CBD might decrease how quickly the body breaks down carbamazepine. This might increase levels of carbamazepine in the body and increase its side effects.  Citalopram (Celexa) Citalopram is changed and broken down by the body. CBD might decrease how quickly the body breaks down citalopram. This might increase levels of citalopram in the body and increase its side effects.  Clobazam (Onfi) Clobazam is changed and broken down by the liver. CBD might decrease how quickly the liver breaks down clobazam. This might increase the effects and side effects of clobazam.  Eslicarbazepine (Aptiom) Eslicarbazepine is changed and broken down by the body. CBD might decrease how quickly the body breaks down eslicarbazepine. This might increase levels of eslicarbazepine in the body by a small amount.  Everolimus (Zostress) Everolimus is changed and broken down by the body. CBD might decrease how quickly the body breaks down everolimus. This might increase levels of everolimus in the body.  Lithium Taking higher doses of CBD might increase levels of lithium. This can increase the risk of lithium toxicity.  Medications changed by the liver (Cytochrome P450 1A1 (CYP1A1) substrates) Some medications are changed and broken down by the liver. CBD might change how quickly the liver breaks down  these medications. This could change the effects and side effects of these medications.  Medications changed by the liver (Cytochrome P450 1A2 (CYP1A2) substrates) Some medications are changed  and broken down by the liver. CBD might change how quickly the liver breaks down these medications. This could change the effects and side effects of these medications.  Medications changed by the liver (Cytochrome P450 1B1 (CYP1B1) substrates) Some medications are changed and broken down by the liver. CBD might change how quickly the liver breaks down these medications. This could change the effects and side effects of these medications.  Medications changed by the liver (Cytochrome P450 2A6 (CYP2A6) substrates) Some medications are changed and broken down by the liver. CBD might change how quickly the liver breaks down these medications. This could change the effects and side effects of these medications.  Medications changed by the liver (Cytochrome P450 2B6 (CYP2B6) substrates) Some medications are changed and broken down by the liver. CBD might change how quickly the liver breaks down these medications. This could change the effects and side effects of these medications.  Medications changed by the liver (Cytochrome P450 2C19 (CYP2C19) substrates) Some medications are changed and broken down by the liver. CBD might change how quickly the liver breaks down these medications. This could change the effects and side effects of these medications.  Medications changed by the liver (Cytochrome P450 2C8 (CYP2C8) substrates) Some medications are changed and broken down by the liver. CBD might change how quickly the liver breaks down these medications. This could change the effects and side effects of these medications.  Medications changed by the liver (Cytochrome P450 2C9 (CYP2C9) substrates) Some medications are changed and broken down by the liver. CBD might change how quickly the liver breaks down these  medications. This could change the effects and side effects of these medications.  Medications changed by the liver (Cytochrome P450 2D6 (CYP2D6) substrates) Some medications are changed and broken down by the liver. CBD might change how quickly the liver breaks down these medications. This could change the effects and side effects of these medications.  Medications changed by the liver (Cytochrome P450 2E1 (CYP2E1) substrates) Some medications are changed and broken down by the liver. CBD might change how quickly the liver breaks down these medications. This could change the effects and side effects of these medications.  Medications changed by the liver (Cytochrome P450 3A4 (CYP3A4) substrates) Some medications are changed and broken down by the liver. CBD might change how quickly the liver breaks down these medications. This could change the effects and side effects of these medications.  Medications changed by the liver (Glucuronidated drugs) Some medications are changed and broken down by the liver. CBD might change how quickly the liver breaks down these medications. This could change the effects and side effects of these medications.  Medications that decrease the breakdown of other medications by the liver (Cytochrome P450 2C19 (CYP2C19) inhibitors) CBD is changed and broken down by the liver. Some drugs decrease how quickly the liver changes and breaks down CBD. This could change the effects and side effects of CBD.  Medications that decrease the breakdown of other medications in the liver (Cytochrome P450 3A4 (CYP3A4) inhibitors) CBD is changed and broken down by the liver. Some drugs decrease how quickly the liver changes and breaks down CBD. This could change the effects and side effects of CBD.  Medications that increase breakdown of other medications by the liver (Cytochrome P450 3A4 (CYP3A4) inducers) CBD is changed and broken down by the liver. Some drugs increase how quickly the  liver changes and breaks down CBD. This could change the effects and side effects of CBD.  Medications that increase the breakdown of other medications by the liver (Cytochrome P450 2C19 (CYP2C19) inducers) CBD is changed and broken down by the liver. Some drugs increase how quickly the liver changes and breaks down CBD. This could change the effects and side effects of CBD.  Methadone (Dolophine) Methadone is broken down by the liver. CBD might decrease how quickly the liver breaks down methadone. Taking cannabidiol along with methadone might increase the effects and side effects of methadone.  Rufinamide (Banzel) Rufinamide is changed and broken down by the body. CBD might decrease how quickly the body breaks down rufinamide. This might increase levels of rufinamide in the body by a small amount.  Sedative medications (CNS depressants) CBD might cause sleepiness and slowed breathing. Some medications, called sedatives, can also cause sleepiness and slowed breathing. Taking CBD with sedative medications might cause breathing problems and/or too much sleepiness.  Sirolimus (Rapamune) Sirolimus is changed and broken down by the body. CBD might decrease how quickly the body breaks down sirolimus. This might increase levels of sirolimus in the body.  Stiripentol (Diacomit) Stiripentol is changed and broken down by the body. CBD might decrease how quickly the body breaks down stiripentol. This might increase levels of stiripentol in the body and increase its side effects.  Tacrolimus (Prograf) Tacrolimus is changed and broken down by the body. CBD might decrease how quickly the body breaks down tacrolimus. This might increase levels of tacrolimus in the body.  Tamoxifen (Soltamox) Tamoxifen is changed and broken down by the body. CBD might affect how quickly the body breaks down tamoxifen. This might affect levels of tamoxifen in the body.  Topiramate (Topamax) Topiramate is changed and broken  down by the body. CBD might decrease how quickly the body breaks down topiramate. This might increase levels of topiramate in the body by a small amount.  Valproate Valproic acid can cause liver injury. Taking cannabidiol with valproic acid might increase the chance of liver injury. CBD and/or valproic acid might need to be stopped, or the dose might need to be reduced.  Warfarin (Coumadin) CBD might increase levels of warfarin, which can increase the risk for bleeding. CBD and/or warfarin might need to be stopped, or the dose might need to be reduced.  Zonisamide Zonisamide is changed and broken down by the body. CBD might decrease how quickly the body breaks down zonisamide. This might increase levels of zonisamide in the body by a small amount. (Last update: 03/17/2021) ____________________________________________________________________________________________  ____________________________________________________________________________________________  Drug Holidays (Slow)  What is a "Drug Holiday"? Drug Holiday: is the name given to the period of time during which a patient stops taking a medication(s) for the purpose of eliminating tolerance to the drug.  Benefits Improved effectiveness of opioids. Decreased opioid dose needed to achieve benefits. Improved pain with lesser dose.  What is tolerance? Tolerance: is the progressive decreased in effectiveness of a drug due to its repetitive use. With repetitive use, the body gets use to the medication and as a consequence, it loses its effectiveness. This is a common problem seen with opioid pain medications. As a result, a larger dose of the drug is needed to achieve the same effect that used to be obtained with a smaller dose.  How long should a "Drug Holiday" last? You should stay off of the pain medicine for at least 14 consecutive days. (2 weeks)  Should I stop the medicine "cold [place]"? No. You should always coordinate with  your Pain Specialist so that he/she can  provide you with the correct medication dose to make the transition as smoothly as possible.  How do I stop the medicine? Slowly. You will be instructed to decrease the daily amount of pills that you take by one (1) pill every seven (7) days. This is called a "slow downward taper" of your dose. For example: if you normally take four (4) pills per day, you will be asked to drop this dose to three (3) pills per day for seven (7) days, then to two (2) pills per day for seven (7) days, then to one (1) per day for seven (7) days, and at the end of those last seven (7) days, this is when the "Drug Holiday" would start.   Will I have withdrawals? By doing a "slow downward taper" like this one, it is unlikely that you will experience any significant withdrawal symptoms. Typically, what triggers withdrawals is the sudden stop of a high dose opioid therapy. Withdrawals can usually be avoided by slowly decreasing the dose over a prolonged period of time. If you do not follow these instructions and decide to stop your medication abruptly, withdrawals may be possible.  What are withdrawals? Withdrawals: refers to the wide range of symptoms that occur after stopping or dramatically reducing opiate drugs after heavy and prolonged use. Withdrawal symptoms do not occur to patients that use low dose opioids, or those who take the medication sporadically. Contrary to benzodiazepine (example: Valium, Xanax, etc.) or alcohol withdrawals ("Delirium Tremens"), opioid withdrawals are not lethal. Withdrawals are the physical manifestation of the body getting rid of the excess receptors.  Expected Symptoms Early symptoms of withdrawal may include: Agitation Anxiety Muscle aches Increased tearing Insomnia Runny nose Sweating Yawning  Late symptoms of withdrawal may include: Abdominal cramping Diarrhea Dilated pupils Goose bumps Nausea Vomiting  Will I experience  withdrawals? Due to the slow nature of the taper, it is very unlikely that you will experience any.  What is a slow taper? Taper: refers to the gradual decrease in dose.  (Last update: 08/07/2019) ____________________________________________________________________________________________   ____________________________________________________________________________________________  Pharmacy Shortages of Pain Medication   Introduction Shockingly as it may seem, .  "No [place] Supreme Court decision has ever interpreted the Constitution as guaranteeing a right to health care for all Americans." - https://huff.com/  "With respect to human rights, the [place] has no formally codified right to health, nor does it participate in a human rights treaty that specifies a right to health." - Scott J. Schweikart, JD, MBE  Situation By now, most of our patients have had the experience of being told by their pharmacist that they do not have enough medication to cover their prescription. If you have not had this experience, just know that you soon will.  Problem There appears to be a shortage of these medications, either at the national level or locally. This is happening with all pharmacies. When there is not enough medication, patients are offered a partial fill and they are told that they will try to get the rest of the medicine for them at a later time. If they do not have enough for even a partial fill, the pharmacists are telling the patients to call us (the prescribing physicians) to request that we send another prescription to another pharmacy to get the medicine.   This reordering of a controlled substance creates documentation problems where additional paperwork needs to be created to explain why two prescriptions for the same period of time and the same medicine are being  prescribed to the same patient. It also creates  situations where the last appointment note does not accurately reflect when and what prescriptions were given to a patient. This leads to prescribing errors down the line, in subsequent follow-up visits.   [employer] of Pharmacy ([employer]) Research revealed that Surveyor, quantity .1806 (21 NCAC 46.1806) authorizes pharmacists to the transfer of prescriptions among pharmacies, and it sets forth procedural and recordkeeping requirements for doing so. However, this requires the pharmacist to complete the previously mentioned procedural paperwork to accomplish the transfer. As it turns out, it is much easier for them to have the prescribing physicians do the work.   Possible solutions 1. Have the [facility] Assembly add a provision to the "STOP ACT" (the law that mandates how controlled substances are prescribed) where there is an exception to the electronic prescribing rule that states that in the event there are shortages of medications the physicians are allowed to use written prescriptions as opposed to electronic ones. This would allow patients to take their prescriptions to a different pharmacy that may have enough medication available to fill the prescription. The problem is that currently there is a law that does not allow for written prescriptions, with the exception of instances where the electronic medical record is down due to technical issues.  2. Have [place] Congress ease the pressure on pharmaceutical companies, allowing them to produce enough quantities of the medication to adequately supply the population. 3. Have pharmacies keep enough stocks of these medications to cover their client base.  4. Have the [facility] Assembly add a provision to the "STOP ACT" where they ease the regulations surrounding the transfer of controlled substances between pharmacies, so as to simplify the transfer of supplies. As an alternative, develop a system to allow patients to obtain the  remainder of their prescription at another one of their pharmacies or at an associate pharmacy.   How this shortage will affect you.  The one thing that is abundantly clear is that this is a pharmacy supply problem  and not a prescriber problem. The job of the prescriber is to evaluate and monitor the patients for the appropriate indications to the use of these medicines, the monitoring of their use and the prescribing of the appropriate dose and regimen. It is not the job of the prescriber to provide or dispense the actual medication. By law, this is the job of the pharmacies and pharmacists. It is certainly not the job of the prescriber to solve the supply problems.   Due to the above problems we are no longer taking patients to write for their pain medication. We will continue to evaluate for appropriate indications and we may provide recommendations regarding medication, dose, and schedule, as well as monitoring recommendations, however, we will not be taking over the actual prescribing of these substances. On those patients where we are treating their chronic pain with interventional therapies, exceptions will be considered on a case by case basis. At this time, we will try to continue providing this supplemental service to those patients we have been managing in the past. However, as of August 1st, 2023, we no longer will be sending additional prescriptions to other pharmacies for the purpose of solving their supply problems. Once we send a prescription to a pharmacy, we will not be resending it again to another pharmacy to cover for their shortages.   What to do. Write as many letters as you can. Recruit the help  of family members in writing these letters. Below are some of the places where you can write to make your voice heard. Let them know what the problem is and push them to look for solutions.   Search internet for: "[employer] find your  legislators" NoseSwap.is  Search internet for: "[employer] commissioner complaints" Starlas.fi  Search internet for: "[employer] complaints" https://www.hernandez-brewer.com/.htm  Search internet for: "[facility] pharmacy complaints" Email [facility] Customer [facility] woondaal.com.jsp?callType=store  Search internet for: Programme researcher, broadcasting/film/video customer service complaints" [email or web address]://[email or web address]/topic/marketing/[email or web address]_[email or web address]  ____________________________________________________________________________________________

## 2021-07-21 NOTE — Progress Notes (Signed)
Nursing Pain Medication Assessment:  Safety precautions to be maintained throughout the outpatient stay will include: orient to surroundings, keep bed in low position, maintain call bell within reach at all times, provide assistance with transfer out of bed and ambulation.  Medication Inspection Compliance: Pill count conducted under aseptic conditions, in front of the patient. Neither the pills nor the bottle was removed from the patient's sight at any time. Once count was completed pills were immediately returned to the patient in their original bottle.  Medication: Oxycodone IR Pill/Patch Count:  24 of 60 pills remain Pill/Patch Appearance: Markings consistent with prescribed medication Bottle Appearance: Standard pharmacy container. Clearly labeled. Filled Date: 06 / 12 / 2023 Last Medication intake:  Today

## 2021-07-27 ENCOUNTER — Ambulatory Visit: Payer: No Typology Code available for payment source | Admitting: Licensed Clinical Social Worker

## 2021-07-27 DIAGNOSIS — G894 Chronic pain syndrome: Secondary | ICD-10-CM

## 2021-07-27 DIAGNOSIS — M159 Polyosteoarthritis, unspecified: Secondary | ICD-10-CM

## 2021-07-27 DIAGNOSIS — E1169 Type 2 diabetes mellitus with other specified complication: Secondary | ICD-10-CM

## 2021-07-29 ENCOUNTER — Ambulatory Visit: Payer: Medicare Other | Admitting: Podiatry

## 2021-07-29 DIAGNOSIS — Z961 Presence of intraocular lens: Secondary | ICD-10-CM | POA: Diagnosis not present

## 2021-07-29 DIAGNOSIS — H26493 Other secondary cataract, bilateral: Secondary | ICD-10-CM | POA: Diagnosis not present

## 2021-07-29 DIAGNOSIS — R6889 Other general symptoms and signs: Secondary | ICD-10-CM | POA: Diagnosis not present

## 2021-07-29 DIAGNOSIS — E119 Type 2 diabetes mellitus without complications: Secondary | ICD-10-CM | POA: Diagnosis not present

## 2021-07-29 LAB — HM DIABETES EYE EXAM

## 2021-07-30 DIAGNOSIS — R6889 Other general symptoms and signs: Secondary | ICD-10-CM | POA: Diagnosis not present

## 2021-07-30 NOTE — Chronic Care Management (AMB) (Signed)
Care Management Clinical Social Work Note  07/30/2021 Name: James [name] MRN: [id number] DOB: 1960/08/29  James [name] is a 61 y.o. year old male who is a primary care patient of James Hauser, DO.  The Care Management team was consulted for assistance with chronic disease management and coordination needs.  Engaged with patient by telephone for follow up visit in response to provider referral for social work chronic care management and care coordination services  Consent to Services:  James [name] was given information about Care Management services today including:  Care Management services includes personalized support from designated clinical staff supervised by his physician, including individualized plan of care and coordination with other care providers 24/7 contact phone numbers for assistance for urgent and routine care needs. The patient may stop case management services at any time by phone call to the office staff.  Patient agreed to services and consent obtained.   Assessment: Review of patient past medical history, allergies, medications, and health status, including review of relevant consultants reports was performed today as part of a comprehensive evaluation and provision of chronic care management and care coordination services.  SDOH (Social Determinants of Health) assessments and interventions performed:    Advanced Directives Status: Not addressed in this encounter.  Care Plan  No Known Allergies  Outpatient Encounter Medications as of 07/27/2021  Medication Sig Note   albuterol (PROAIR HFA) 108 (90 Base) MCG/ACT inhaler Inhale 1-2 puffs into the lungs every 6 (six) hours as needed for wheezing or shortness of breath.    atorvastatin (LIPITOR) 40 MG tablet Take 1 tablet (40 mg total) by mouth daily.    hydrochlorothiazide (HYDRODIURIL) 25 MG tablet Take 1 tablet (25 mg total) by mouth daily.    losartan (COZAAR) 25 MG tablet Take 1 tablet (25  mg total) by mouth daily.    metFORMIN (GLUCOPHAGE) 1000 MG tablet Take 1 tablet (1,000 mg total) by mouth daily with breakfast.    Oxycodone HCl 10 MG TABS Take 1 tablet (10 mg total) by mouth in the morning and at bedtime. Must last 30 days.    [START ON 08/27/2021] Oxycodone HCl 10 MG TABS Take 1 tablet (10 mg total) by mouth in the morning and at bedtime. Must last 30 days.    [START ON 09/26/2021] Oxycodone HCl 10 MG TABS Take 1 tablet (10 mg total) by mouth in the morning and at bedtime. Must last 30 days. 07/21/2021: WARNING: Not a Duplicate. Future prescription. DO NOT DELETE during hospital medication reconciliation or at discharge. [facility] Chronic Pain Management Patient    OZEMPIC, 1 MG/DOSE, 4 MG/3ML SOPN Inject 1 mg into the skin once a week.    pregabalin (LYRICA) 150 MG capsule Take 2 caps for '300mg'$  in AM, and take 1 cap for '150mg'$  at night    varenicline (CHANTIX CONTINUING MONTH PAK) 1 MG tablet Take 1 tablet (1 mg total) by mouth 2 (two) times daily.    No facility-administered encounter medications on file as of 07/27/2021.    Patient Active Problem List   Diagnosis Date Noted   Hyperlipidemia associated with type 2 diabetes mellitus ([place]) 06/25/2021   Abnormal MRI, lumbar spine (05/21/2021) 05/24/2021   Chronic arthropathy 05/24/2021   Pes planus 05/24/2021   Osteoarthritis of AC (acromioclavicular) joint (Left) 04/29/2021   Osteoarthritis of shoulder (Left) 04/29/2021   Rotator cuff arthropathy of shoulder (Left) 04/29/2021   Lumbosacral facet hypertrophy (L4-5, L5-S1) 04/29/2021   Lumbar facet syndrome 04/29/2021   Abnormal MRI, hip (  04/21/2021) 04/29/2021   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) 04/29/2021   Abnormal EMG (electromyogram) (03/16/2021) 04/29/2021   Polyneuropathy, 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   Diabetic sensorimotor polyneuropathy ([place]) 04/29/2021   Diabetic neuropathy ([place]) 04/26/2021   Osteoarthritis of hips (Bilateral) 03/29/2021    Osteoarthritis of hip (Left) 03/29/2021   Osteoarthritis of hip (Right) 03/29/2021   Osteoarthritis of knee (Left) 03/29/2021   Elevated sed rate 03/29/2021   Vitamin D deficiency 03/29/2021   Elevated C-reactive protein (CRP) 03/29/2021   Lumbosacral radiculopathy at L5 (Left) 03/29/2021   Fall (03/22/2021) 03/29/2021   Pharmacologic therapy 02/15/2021   Disorder of skeletal system 02/15/2021   Problems influencing health status 02/15/2021   Chronic use of opiate for therapeutic purpose 02/15/2021   Type 2 diabetes mellitus with morbid obesity ([place]) 02/15/2021   Chronic lower extremity pain (1ry area of Pain) (Bilateral) 02/15/2021   Chronic low back pain (4th area of Pain) (Bilateral) w/ sciatica (Bilateral) 02/15/2021   Chronic shoulder pain (5th area of Pain) (Left) 02/15/2021   Chronic hip pain (2ry area of Pain) (Bilateral) 02/15/2021   Chronic knee pain (3ry area of Pain) (Bilateral) 02/15/2021   Weakness of lower extremities (Bilateral) 01/15/2021   Morbid obesity with BMI of 45.0-49.9, adult ([place]) 01/15/2021   Recurrent falls 01/15/2021   Chronic pain syndrome 11/09/2020   DDD (degenerative disc disease), lumbar 11/09/2020   Primary osteoarthritis involving multiple joints 11/09/2020   Diabetes mellitus ([place]) 11/09/2020    Conditions to be addressed/monitored: DMII, Osteoarthritis, and Chronic Pain  Housing concerns  Care Plan : LCSW Plan of Care  Updates made by Rebekah Chesterfield, LCSW since 07/30/2021 12:00 AM     Problem: Barriers to Treatment      Goal: Barriers to Treatment Identified and Managed Completed 07/27/2021  Start Date: 04/27/2021  Expected End Date: 07/16/2021  This Visit's Progress: On track  Recent Progress: On track  Priority: High  Note:   Current barriers:    Limited access to food and Housing barriers Clinical Goals: Patient will work with CCM LCSW to address needs related to psychosocial stressors to improve management of health  conditions Clinical Interventions:  Assessment of needs, barriers , agencies contacted, as well as how impacting care Patient is doing well. He continues to receive meals from local church Patient is scheduled to obtain independent housing tentatively on 07/28 Reviewed upcoming appts Pt denies depression and anxiety symptoms. Was successful in identifying healthy coping skills (reading Bible) Solution-Focused Strategies employed:  Active listening / Reflection utilized  Emotional Support Provided Verbalization of feelings encouraged  Inter-disciplinary care team collaboration (see longitudinal plan of care) Patient Goals/Self-Care Activities: Over the next 120 days Attend all scheduled medical appointments Utilize healthy coping skills and/or supportive resources discussed Contact PCP office with any questions or concerns      Follow Up Plan: No follow up required  Christa See, MSW, [email or web address] Phone [phone] 4:32 PM

## 2021-07-30 NOTE — Patient Instructions (Signed)
Visit Information  Thank you for taking time to visit with me today. Please don't hesitate to contact me if I can be of assistance to you before our next scheduled telephone appointment.  Following are the goals we discussed today:  Patient Goals/Self-Care Activities: Over the next 120 days Attend all scheduled medical appointments Utilize healthy coping skills and/or supportive resources discussed Contact PCP office with any questions or concerns  No follow up required  If you are experiencing a Mental Health or [place] or need someone to talk to, please call the Suicide and Crisis Lifeline: 988 call 911   Patient verbalizes understanding of instructions and care plan provided today and agrees to view in [place]. Active MyChart status and patient understanding of how to access instructions and care plan via MyChart confirmed with patient.     Christa See, MSW, [facility] Care Management [email or web address] Phone [phone] 4:33 PM

## 2021-08-05 DIAGNOSIS — R6889 Other general symptoms and signs: Secondary | ICD-10-CM | POA: Diagnosis not present

## 2021-08-11 ENCOUNTER — Ambulatory Visit: Payer: Medicare HMO | Admitting: Physical Therapy

## 2021-08-16 ENCOUNTER — Telehealth: Payer: Self-pay

## 2021-08-16 NOTE — Telephone Encounter (Signed)
I don't believe I can do this, as I do not do disability assessments here.  I am willing to look at the paperwork, but I cannot guarantee that it is a form that I am able to fill out.  Is he already on medical disability?   Can you get more information on his current disability status first?  If he is already receiving social security disability that has been approved, then I may be able to do the form.  Nobie Putnam, [facility] 08/16/2021, 10:16 AM

## 2021-08-16 NOTE — Telephone Encounter (Signed)
Copied from [place] [id number]. Topic: General - Inquiry >> Aug 13, 2021  1:59 PM Teressa P wrote: Reason for CRM: Pt wants to know if Dr Raliegh Ip will fill out papers saying he id 100% disabled so he can get into a handycap apartment  CB@  [phone] >> Aug 13, 2021  2:04 PM Teressa P wrote: He said it could be a statement from the provider that he is 100% handicapped.

## 2021-08-16 NOTE — Telephone Encounter (Signed)
Called pt left VM to call back.  KP 

## 2021-08-17 ENCOUNTER — Encounter: Payer: Self-pay | Admitting: Physical Therapy

## 2021-08-17 ENCOUNTER — Ambulatory Visit: Payer: Medicare HMO | Attending: Family Medicine | Admitting: Physical Therapy

## 2021-08-17 ENCOUNTER — Ambulatory Visit: Payer: Medicare HMO | Admitting: Physical Therapy

## 2021-08-17 DIAGNOSIS — M5136 Other intervertebral disc degeneration, lumbar region: Secondary | ICD-10-CM | POA: Diagnosis not present

## 2021-08-17 DIAGNOSIS — G894 Chronic pain syndrome: Secondary | ICD-10-CM | POA: Diagnosis not present

## 2021-08-17 DIAGNOSIS — M159 Polyosteoarthritis, unspecified: Secondary | ICD-10-CM | POA: Diagnosis not present

## 2021-08-17 DIAGNOSIS — M25551 Pain in right hip: Secondary | ICD-10-CM | POA: Diagnosis not present

## 2021-08-17 DIAGNOSIS — R6889 Other general symptoms and signs: Secondary | ICD-10-CM | POA: Diagnosis not present

## 2021-08-17 DIAGNOSIS — G8929 Other chronic pain: Secondary | ICD-10-CM | POA: Diagnosis not present

## 2021-08-17 DIAGNOSIS — M5459 Other low back pain: Secondary | ICD-10-CM | POA: Diagnosis not present

## 2021-08-17 NOTE — Therapy (Signed)
OUTPATIENT PHYSICAL THERAPY THORACOLUMBAR EVALUATION   Patient Name: James [name] MRN: [id number] DOB:Apr 12, 1960, 61 y.o., male Today's Date: 08/17/2021    Past Medical History:  Diagnosis Date   Arthritis    Diabetes mellitus without complication ([place])    Hyperlipidemia    Hypertension    Past Surgical History:  Procedure Laterality Date   APPENDECTOMY     COLONOSCOPY WITH PROPOFOL     COLONOSCOPY WITH PROPOFOL N/A 04/23/2021   Procedure: COLONOSCOPY WITH PROPOFOL;  Surgeon: Jonathon Bellows, MD;  Location[facility] ENDOSCOPY;  Service: Gastroenterology;  Laterality: N/A;   Patient Active Problem List   Diagnosis Date Noted   Hyperlipidemia associated with type 2 diabetes mellitus ([place]) 06/25/2021   Abnormal MRI, lumbar spine (05/21/2021) 05/24/2021   Chronic arthropathy 05/24/2021   Pes planus 05/24/2021   Osteoarthritis of AC (acromioclavicular) joint (Left) 04/29/2021   Osteoarthritis of shoulder (Left) 04/29/2021   Rotator cuff arthropathy of shoulder (Left) 04/29/2021   Lumbosacral facet hypertrophy (L4-5, L5-S1) 04/29/2021   Lumbar facet syndrome 04/29/2021   Abnormal MRI, hip (04/21/2021) 04/29/2021   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) 04/29/2021   Abnormal EMG (electromyogram) (03/16/2021) 04/29/2021   Polyneuropathy, 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   Diabetic sensorimotor polyneuropathy ([place]) 04/29/2021   Diabetic neuropathy ([place]) 04/26/2021   Osteoarthritis of hips (Bilateral) 03/29/2021   Osteoarthritis of hip (Left) 03/29/2021   Osteoarthritis of hip (Right) 03/29/2021   Osteoarthritis of knee (Left) 03/29/2021   Elevated sed rate 03/29/2021   Vitamin D deficiency 03/29/2021   Elevated C-reactive protein (CRP) 03/29/2021   Lumbosacral radiculopathy at L5 (Left) 03/29/2021   Fall (03/22/2021) 03/29/2021   Pharmacologic therapy 02/15/2021   Disorder of skeletal system 02/15/2021   Problems influencing health status 02/15/2021   Chronic use of  opiate for therapeutic purpose 02/15/2021   Type 2 diabetes mellitus with morbid obesity ([place]) 02/15/2021   Chronic lower extremity pain (1ry area of Pain) (Bilateral) 02/15/2021   Chronic low back pain (4th area of Pain) (Bilateral) w/ sciatica (Bilateral) 02/15/2021   Chronic shoulder pain (5th area of Pain) (Left) 02/15/2021   Chronic hip pain (2ry area of Pain) (Bilateral) 02/15/2021   Chronic knee pain (3ry area of Pain) (Bilateral) 02/15/2021   Weakness of lower extremities (Bilateral) 01/15/2021   Morbid obesity with BMI of 45.0-49.9, adult ([place]) 01/15/2021   Recurrent falls 01/15/2021   Chronic pain syndrome 11/09/2020   DDD (degenerative disc disease), lumbar 11/09/2020   Primary osteoarthritis involving multiple joints 11/09/2020   Diabetes mellitus ([place]) 11/09/2020    PCP: Dossie Arbour, MD  REFERRING PROVIDER: Nobie Putnam DO   REFERRING DIAG: Chronic pain   Rationale for Evaluation and Treatment Rehabilitation  THERAPY DIAG:  No diagnosis found.  ONSET DATE: Pt reports 10+ years ago  SUBJECTIVE:  SUBJECTIVE STATEMENT: R LBP with RLE pain, decreased strength, fall risk  PERTINENT HISTORY:  Pt reports with R sided LBP with R sided hip and knee pain for >10years. Pt reports over the past 2-3 years he started not being able to balance in standing and this got much worse. Pt enters clinic in [place] scooter he has had for 2 months because he could no longer safely ambulate community distances, was having several falls. Reports in his home he ambulates with 2 canes (has tried RW reports he does not like it), and uses [place] scooter in the community. Pt uses public transportation for all community errands. Pt lives alone in a first floor appt. Since having his chair he has fallen 1x walking  from kitchen to bedroom with 2 canes, and was able to get himself up independently. Prior to that has fallen at least 5x in the house the same way, and walking down steps at his brothers house, and was able to get up each time he fell independently. Pt reports he is able to transfer chair <> chair/toilet/surface modI with cane; and bed mobility is very difficult. Pt reports he cannot stand still for more than a minute d/t BLE weakness and pain, due to this he does not cook and some days does not eat. Can walk about 67mns before leg weakness and pain causes him to have to sit. Sitting is okay, but has to reposition frequently. Tosses and turns in bed all night, transfers from bed to chair for sleep accordingly.  Reports pain is always 11/10, worse with any WB activity. Nothing makes his pain feel better. Pt denies N/V, B&B changes, unexplained weight fluctuation, saddle paresthesia, fever, night sweats, or unrelenting night pain at this time.   PAIN:  Are you having pain? Yes: NPRS scale: 11/10 Pain location: R low back and hip Pain description: sharp and stabbing Aggravating factors: walking >224ms, standing > 28m90m, sitting without repositioning, rolling in bed, steps, in and out of a car, cold weather Relieving factors: rest   PRECAUTIONS: Fall  WEIGHT BEARING RESTRICTIONS No  FALLS:  Has patient fallen in last 6 months? Yes. Number of falls 6  LIVING ENVIRONMENT: Lives with: lives with their family Lives in: House/apartment Stairs: No Has following equipment at home: Single point cane and [employer]ooter, has ordered shower seat  OCCUPATION: none  PLOF: Requires assistive device for independence  PATIENT GOALS be able to transfer and walk    OBJECTIVE:   DIAGNOSTIC FINDINGS:  IMPRESSION: 1. Transitional lumbosacral anatomy. 2. 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 3. At L3-4, a right subarticular, inferiorly migrating disc extrusion contribute to the spinal canal stenosis and resulting in narrowing of the right subarticular zone, likely impinging on the traversing right L4 nerve root. 4. Moderate left neural foraminal narrowing at L2-3 and bilaterally at L3-4.  PATIENT SURVEYS:  FOTO next session  SCREENING FOR RED FLAGS: Bowel or bladder incontinence: No Spinal tumors: No Cauda equina syndrome: No Compression fracture: No Abdominal aneurysm: No  COGNITION:  Overall cognitive status: Within functional limits for tasks assessed     SENSATION: WFL  MUSCLE LENGTH: Hamstrings: shortened bilat, unable to extend knee past 90d Thomas test: unable to attain test position, shortened evident by inability to lay supine with hip + knees ext  POSTURE:  lower crossed syndrome evident, unable to achieve  full hip + knee ext likely d/t weakness. Increased thoracic kyphosis and rounded shoulders    PALPATION: Not tolerated. Severe pain to entire R hip and bilat low back musculature  LUMBAR ROM:   Active  A/PROM  eval  Flexion 50% limited  Extension Nearly 100% limited  Right lateral flexion   Left lateral flexion   Right rotation 50% limited  Left rotation 50% limited   (Blank rows = not tested)  LOWER EXTREMITY ROM:     Active / PROM Right eval Left eval  Hip flexion Unable/90d 100d/100d  Hip extension Not formally tested, unable to attain neutral in standing bilat  Hip abduction Passively limited 25% bilat; actively unable at all in supine  Hip adduction WNL passively bilat; unable actively  Hip internal rotation Passively limited 50%; actively unable at all in sitting Passively limited 50%; actively 50% limited in sitting  Hip external rotation Passively limited 50%; actively unable at all in sitting Passively limited 50%; actively 25% limited in sitting  Knee flexion    Knee  extension 45/10 25/10  Ankle dorsiflexion WNL A/PROM WNL A/PROM  Ankle plantarflexion    Ankle inversion    Ankle eversion     (Blank rows = not tested)  LOWER EXTREMITY MMT:    MMT Right eval Left eval  Hip flexion    Hip extension    Hip abduction    Hip adduction    Hip internal rotation    Hip external rotation    Knee flexion    Knee extension    Ankle dorsiflexion    Ankle plantarflexion    Ankle inversion    Ankle eversion     (Blank rows = not tested)  LUMBAR SPECIAL TESTS:  {lumbar special test:25242}  FUNCTIONAL TESTS:  STS transfer with heavy bilat UE use  modI Supine to sit transfer modI using UE to manipulate LEs, increased time needed Rolling modI with increased time needed   GAIT: Distance walked: *** Assistive device utilized: {Assistive devices:23999} Level of assistance: {Levels of assistance:24026} Comments: ***    TODAY'S TREATMENT  ***   PATIENT EDUCATION:  Education details: *** Person educated: {Person educated:25204} Education method: {Education Method:25205} Education comprehension: {Education Comprehension:25206}   HOME EXERCISE PROGRAM: ***  ASSESSMENT:  CLINICAL IMPRESSION: Patient is a *** y.o. *** who was seen today for physical therapy evaluation and treatment for ***.    OBJECTIVE IMPAIRMENTS {opptimpairments:25111}.   ACTIVITY LIMITATIONS {activitylimitations:27494}  PARTICIPATION LIMITATIONS: {participationrestrictions:25113}  PERSONAL FACTORS {Personal factors:25162} are also affecting patient's functional outcome.   REHAB POTENTIAL: {rehabpotential:25112}  CLINICAL DECISION MAKING: {clinical decision making:25114}  EVALUATION COMPLEXITY: {Evaluation complexity:25115}   GOALS: Goals reviewed with patient? {yes/no:20286}  SHORT TERM GOALS: Target date: {follow up:25551}  *** Baseline: Goal status: {GOALSTATUS:25110}  2.  *** Baseline:  Goal status: {GOALSTATUS:25110}  3.  *** Baseline:  Goal  status: {GOALSTATUS:25110}  4.  *** Baseline:  Goal status: {GOALSTATUS:25110}  5.  *** Baseline:  Goal status: {GOALSTATUS:25110}  6.  *** Baseline:  Goal status: {GOALSTATUS:25110}  LONG TERM GOALS: Target date: {follow up:25551}  *** Baseline:  Goal status: {GOALSTATUS:25110}  2.  *** Baseline:  Goal status: {GOALSTATUS:25110}  3.  *** Baseline:  Goal status: {GOALSTATUS:25110}  4.  *** Baseline:  Goal status: {GOALSTATUS:25110}  5.  *** Baseline:  Goal status: {GOALSTATUS:25110}  6.  *** Baseline:  Goal status: {GOALSTATUS:25110}   PLAN: PT FREQUENCY: {rehab frequency:25116}  PT DURATION: {rehab duration:25117}  PLANNED INTERVENTIONS: {rehab planned interventions:25118::"Therapeutic exercises","Therapeutic activity","Neuromuscular  re-education","Balance training","Gait training","Patient/Family education","Self Care","Joint mobilization"}.  PLAN FOR NEXT SESSION: ***   Durwin Reges, PT 08/17/2021, 11:20 AM

## 2021-08-17 NOTE — Telephone Encounter (Signed)
Patient called and was advising of the message below from Dr. Parks Ranger. Patient says there are no forms to fill out. He says he's moving into an apartment and the only way he can get in a handicap apartment that will have bars and everything needed for a handicap person is for the doctor to just write out something saying that he has a hard time walking and will need to be in a handicap accessible apartment. He also says he went to physical therapy today and they told to ask for a prescription for a heavy duty wide width walker. I advised I will send this to Dr. Parks Ranger and someone will call with his recommendations.

## 2021-08-17 NOTE — Telephone Encounter (Signed)
Please leave a detail message regarding the forms, patient has a physical therapy appointment today and might miss the call.

## 2021-08-18 NOTE — Telephone Encounter (Signed)
I left a VM letting the pt know that he needs to come in for an appt.

## 2021-08-18 NOTE — Telephone Encounter (Signed)
For a new form/letter completion and DME order for walker, he will need an appointment in person or over the phone / virtual.  Please schedule and I can do these requests for him.  Nobie Putnam, DO [facility] 08/18/2021, 11:49 AM

## 2021-08-19 ENCOUNTER — Ambulatory Visit: Payer: Medicare HMO | Admitting: Physical Therapy

## 2021-08-23 ENCOUNTER — Ambulatory Visit: Payer: Medicare HMO | Admitting: Physical Therapy

## 2021-08-25 ENCOUNTER — Ambulatory Visit: Payer: Medicare HMO | Admitting: Physical Therapy

## 2021-08-30 ENCOUNTER — Ambulatory Visit: Payer: Medicare HMO | Admitting: Physical Therapy

## 2021-09-02 ENCOUNTER — Ambulatory Visit: Payer: Medicare HMO | Admitting: Physical Therapy

## 2021-09-06 ENCOUNTER — Ambulatory Visit: Payer: Medicare HMO | Admitting: Physical Therapy

## 2021-09-08 ENCOUNTER — Ambulatory Visit: Payer: Medicare HMO | Admitting: Physical Therapy

## 2021-09-13 ENCOUNTER — Ambulatory Visit: Payer: Medicare HMO | Admitting: Physical Therapy

## 2021-09-16 ENCOUNTER — Ambulatory Visit: Payer: Medicare HMO | Admitting: Physical Therapy

## 2021-09-30 ENCOUNTER — Ambulatory Visit: Payer: Medicare HMO | Admitting: Podiatry

## 2021-10-08 ENCOUNTER — Ambulatory Visit (INDEPENDENT_AMBULATORY_CARE_PROVIDER_SITE_OTHER): Payer: Medicare HMO | Admitting: Podiatry

## 2021-10-08 DIAGNOSIS — R6889 Other general symptoms and signs: Secondary | ICD-10-CM | POA: Diagnosis not present

## 2021-10-08 DIAGNOSIS — M79675 Pain in left toe(s): Secondary | ICD-10-CM

## 2021-10-08 DIAGNOSIS — B351 Tinea unguium: Secondary | ICD-10-CM

## 2021-10-08 DIAGNOSIS — M79674 Pain in right toe(s): Secondary | ICD-10-CM

## 2021-10-08 NOTE — Progress Notes (Signed)
   SUBJECTIVE Patient with a history of diabetes mellitus presents to office today complaining of elongated, thickened nails that cause pain while ambulating in shoes.  Patient is unable to trim their own nails. Patient is here for further evaluation and treatment.   Past Medical History:  Diagnosis Date   Arthritis    Diabetes mellitus without complication ([place])    Hyperlipidemia    Hypertension     OBJECTIVE General Patient is awake, alert, and oriented x 3 and in no acute distress. Derm Skin is dry and supple bilateral. Negative open lesions or macerations. Remaining integument unremarkable. Nails are tender, long, thickened and dystrophic with subungual debris, consistent with onychomycosis, 1-5 bilateral. No signs of infection noted. Vasc  DP and PT pedal pulses palpable bilaterally. Temperature gradient within normal limits.  Neuro Epicritic and protective threshold sensation diminished bilaterally.  Musculoskeletal Exam No symptomatic pedal deformities noted bilateral. Muscular strength within normal limits.  ASSESSMENT 1. Diabetes Mellitus w/ peripheral neuropathy 2.  Pain due to onychomycosis of toenails bilateral  PLAN OF CARE 1. Patient evaluated today. 2. Instructed to maintain good pedal hygiene and foot care. Stressed importance of controlling blood sugar.  3. Mechanical debridement of nails 1-5 bilaterally performed using a nail nipper. Filed with dremel without incident.  4. Return to clinic in 3 mos.     Edrick Kins, DPM [facility]  Dr. Edrick Kins, DPM    [address], [place] [address]                Office [phone]  Fax [phone]

## 2021-10-15 ENCOUNTER — Other Ambulatory Visit: Payer: Medicare HMO

## 2021-10-19 ENCOUNTER — Telehealth: Payer: Self-pay | Admitting: Family Medicine

## 2021-10-19 NOTE — Progress Notes (Unsigned)
PROVIDER NOTE: Information contained herein reflects review and annotations entered in association with encounter. Interpretation of such information and data should be left to medically-trained personnel. Information provided to patient can be located elsewhere in the medical record under "Patient Instructions". Document created using STT-dictation technology, any transcriptional errors that may result from process are unintentional.    Patient: James [name]  Service Category: E/M  Provider: Gaspar Cola, MD  DOB: 1960-09-20  DOS: 10/20/2021  Referring Provider: Nobie Putnam *  MRN: [id number]  Specialty: Interventional Pain Management  PCP: Olin Hauser, DO  Type: Established Patient  Setting: Ambulatory outpatient    Location: Office  Delivery: Face-to-face     HPI  Mr. Audiel Scheiber, a 61 y.o. year old male, is here today because of his Chronic pain syndrome [G89.4]. Mr. Kyne primary complain today is No chief complaint on file. Last encounter: My last encounter with him was on 07/21/2021. Pertinent problems: Mr. Terral has Chronic pain syndrome; DDD (degenerative disc disease), lumbar; Primary osteoarthritis involving multiple joints; Weakness of lower extremities (Bilateral); Chronic lower extremity pain (1ry area of Pain) (Bilateral); Chronic low back pain (4th area of Pain) (Bilateral) w/ sciatica (Bilateral); Chronic shoulder pain (5th area of Pain) (Left); Chronic hip pain (2ry area of Pain) (Bilateral); Chronic knee pain (3ry area of Pain) (Bilateral); Osteoarthritis of hips (Bilateral); Osteoarthritis of hip (Left); Osteoarthritis of hip (Right); Osteoarthritis of knee (Left); Lumbosacral radiculopathy at L5 (Left); Diabetic neuropathy ([place]); Osteoarthritis of AC (acromioclavicular) joint (Left); Osteoarthritis of shoulder (Left); Rotator cuff arthropathy of shoulder (Left); Lumbosacral facet hypertrophy (L4-5, L5-S1); Lumbar facet syndrome;  Abnormal MRI, hip (04/21/2021); 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 Pain Assessment: Severity of   is reported as a  /10. Location:    / . Onset:  . Quality:  . Timing:  . Modifying factor(s):  Marland Kitchen Vitals:  vitals were not taken for this visit.   Reason for encounter: medication management. ***  Pharmacotherapy Assessment  Analgesic: Oxycodone/APAP 10/325 (# 60/month), 1 tab p.o. twice daily (last filled on 02/03/2021) Needs to loose an average of 2.75 lbs/mo to be considered for opioid pharmacotherapy.  MME/day: 30 mg/day   Monitoring: [place] PMP: PDMP reviewed during this encounter.       Pharmacotherapy: No side-effects or adverse reactions reported. Compliance: No problems identified. Effectiveness: Clinically acceptable.  No notes on file  No results found for: "CBDTHCR" No results found for: "D8THCCBX" No results found for: "D9THCCBX"  UDS:  Summary  Date Value Ref Range Status  02/15/2021 Note  Final    Comment:    ==================================================================== Compliance Drug Analysis, Ur ==================================================================== Test                             Result       Flag       Units  Drug Present and Declared for Prescription Verification   Oxycodone                      3020         EXPECTED   ng/mg creat   Oxymorphone                    1094         EXPECTED   ng/mg creat  Noroxycodone                   3969         EXPECTED   ng/mg creat   Noroxymorphone                 265          EXPECTED   ng/mg creat    Sources of oxycodone are scheduled prescription medications.    Oxymorphone, noroxycodone, and noroxymorphone are expected    metabolites of oxycodone. Oxymorphone is also available as a     scheduled prescription medication.    Pregabalin                     PRESENT      EXPECTED   Acetaminophen                  PRESENT      EXPECTED ==================================================================== Test                      Result    Flag   Units      Ref Range   Creatinine              172              mg/dL      >=20 ==================================================================== Declared Medications:  The flagging and interpretation on this report are based on the  following declared medications.  Unexpected results may arise from  inaccuracies in the declared medications.   **Note: The testing scope of this panel includes these medications:   Oxycodone (Percocet)  Pregabalin (Lyrica)   **Note: The testing scope of this panel does not include small to  moderate amounts of these reported medications:   Acetaminophen (Percocet)   **Note: The testing scope of this panel does not include the  following reported medications:   Atorvastatin (Lipitor)  Hydrochlorothiazide (Hydrodiuril)  Insulin Tyler Aas)  Losartan (Cozaar)  Metformin (Glucophage)  Semaglutide (Ozempic) ==================================================================== For clinical consultation, please call [phone]. ====================================================================       ROS  Constitutional: Denies any fever or chills Gastrointestinal: No reported hemesis, hematochezia, vomiting, or acute GI distress Musculoskeletal: Denies any acute onset joint swelling, redness, loss of ROM, or weakness Neurological: No reported episodes of acute onset apraxia, aphasia, dysarthria, agnosia, amnesia, paralysis, loss of coordination, or loss of consciousness  Medication Review  Oxycodone HCl, Semaglutide (1 MG/DOSE), albuterol, atorvastatin, hydrochlorothiazide, losartan, metFORMIN, pregabalin, and varenicline  History Review  Allergy: Mr. Seidel has No Known  Allergies. Drug: Mr. Kovalenko  reports no history of drug use. Alcohol:  reports no history of alcohol use. Tobacco:  reports that he has been smoking cigarettes. He has a 2.50 pack-year smoking history. He has never used smokeless tobacco. Social: Mr. Gramling  reports that he has been smoking cigarettes. He has a 2.50 pack-year smoking history. He has never used smokeless tobacco. He reports that he does not drink alcohol and does not use drugs. Medical:  has a past medical history of Arthritis, Diabetes mellitus without complication ([place]), Hyperlipidemia, and Hypertension. Surgical: Mr. Menzer  has a past surgical history that includes Colonoscopy with propofol; Appendectomy; and Colonoscopy with propofol (N/A, 04/23/2021). Family: family history includes Arthritis in his father and mother; Heart disease in his mother.  Laboratory Chemistry Profile   Renal Lab Results  Component Value Date   BUN 12 06/25/2021   CREATININE 0.67 (L) 06/25/2021  BCR 18 06/25/2021   GFRNONAA >60 10/30/2020    Hepatic Lab Results  Component Value Date   AST 10 06/25/2021   ALT 8 (L) 06/25/2021   ALBUMIN 4.1 02/15/2021   ALKPHOS 116 02/15/2021    Electrolytes Lab Results  Component Value Date   NA 142 06/25/2021   K 4.1 06/25/2021   CL 100 06/25/2021   CALCIUM 9.0 06/25/2021   MG 1.8 02/15/2021    Bone Lab Results  Component Value Date   25OHVITD1 13 (L) 02/15/2021   25OHVITD2 <1.0 02/15/2021   25OHVITD3 12 02/15/2021    Inflammation (CRP: Acute Phase) (ESR: Chronic Phase) Lab Results  Component Value Date   CRP 29 (H) 02/15/2021   ESRSEDRATE 39 (H) 02/15/2021         Note: Above Lab results reviewed.  Recent Imaging Review  DG PAIN CLINIC C-ARM 1-60 MIN NO REPORT Fluoro was used, but no Radiologist interpretation will be provided.  Please refer to "NOTES" tab for provider progress note. Note: Reviewed        Physical Exam  General appearance: Well nourished, well  developed, and well hydrated. In no apparent acute distress Mental status: Alert, oriented x 3 (person, place, & time)       Respiratory: No evidence of acute respiratory distress Eyes: PERLA Vitals: There were no vitals taken for this visit. BMI: Estimated body mass index is 46.52 kg/m as calculated from the following:   Height as of 07/21/21: 6' (1.829 m).   Weight as of 07/21/21: 343 lb (155.6 kg). Ideal: Patient weight not recorded  Assessment   Diagnosis Status  1. Chronic pain syndrome   2. Chronic lower extremity pain (1ry area of Pain) (Bilateral)   3. 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 Chronic shoulder pain (3ry area of Pain) (Left)   5. Primary osteoarthritis of hips (Bilateral)   6. Osteoarthritis of hip (Left)   7. Osteoarthritis of hip (Right)   8. Osteoarthritis of knee (Left)   9. Pharmacologic therapy   10. Chronic use of opiate for therapeutic purpose   11. Encounter for medication management   12. Encounter for chronic pain management    Controlled Controlled Controlled   Updated Problems: No problems updated.   Plan of Care  Problem-specific:  No problem-specific Assessment & Plan notes found for this encounter.  Mr. Izick Gasbarro has a current medication list which includes the following long-term medication(s): albuterol, atorvastatin, hydrochlorothiazide, losartan, metformin, oxycodone hcl, and pregabalin.  Pharmacotherapy (Medications Ordered): No orders of the defined types were placed in this encounter.  Orders:  No orders of the defined types were placed in this encounter.  Follow-up plan:   No follow-ups on file.     Interventional Therapies  Risk  Complexity Considerations:   Estimated body mass index is 46.52 kg/m down from 50.97 kg/m as calculated from the following:   Height as of this encounter: 6' (1.829 m).   Weight as of this encounter: 343 lbs down from 375 lb 12.8 oz (170.5  kg). Our goal is to bring his BMI down to 30 kg/m (220 lbs for his height). He is supposed to lose 2.75 lbs/month. WNL   Planned  Pending:   Diagnostic/therapeutic right L4-5 LESI #1  Notes will be requested from the [facility].   Under consideration:   Diagnostic/therapeutic right L4-5 LESI #1  Diagnostic/therapeutic left IA AC + glenohumeral joint inj. #1  Diagnostic/therapeutic left suprascapular NB #1  Diagnostic/therapeutic bilateral IA hip inj. #1  Diagnostic/therapeutic bilateral IA steroid knee inj. #1  Therapeutic bilateral IA viscosupplementation knee inj. #1  Diagnostic/therapeutic bilateral lumbar facet MBB #1    Completed:   Toradol/Norflex IM 60/60 mg (03/29/2021)  Referral to orthopedic surgery for evaluation of knees and hip (03/29/2021)  Referral to medical weight management (02/15/2021)  Referral to bariatric surgery (02/15/2021)  Diagnostic right L4-5 LESI x1 (06/21/2021) by Dr. Gillis Santa (100/20/0/0)    Completed by other providers:   [facility]  EMG/PNCV (LE) by Gurney Maxin, MD [facility] neurology) (03/16/2021) (chronic, severe sensorimotor polyneuropathy)   Therapeutic  Palliative (PRN) options:   None established      Recent Visits Date Type Provider Dept  07/21/21 Office Visit Milinda Pointer, MD [facility]  Showing recent visits within past 90 days and meeting all other requirements Future Appointments Date Type Provider Dept  10/20/21 Appointment Milinda Pointer, MD [facility]  Showing future appointments within next 90 days and meeting all other requirements  I discussed the assessment and treatment plan with the patient. The patient was provided an opportunity to ask questions and all were answered. The patient agreed with the plan and demonstrated an understanding of the instructions.  Patient advised to call back or seek an in-person evaluation if the  symptoms or condition worsens.  Duration of encounter: *** minutes.  Total time on encounter, as per AMA guidelines included both the face-to-face and non-face-to-face time personally spent by the physician and/or other qualified health care professional(s) on the day of the encounter (includes time in activities that require the physician or other qualified health care professional and does not include time in activities normally performed by clinical staff). Physician's time may include the following activities when performed: preparing to see the patient (eg, review of tests, pre-charting review of records) obtaining and/or reviewing separately obtained history performing a medically appropriate examination and/or evaluation counseling and educating the patient/family/caregiver ordering medications, tests, or procedures referring and communicating with other health care professionals (when not separately reported) documenting clinical information in the electronic or other health record independently interpreting results (not separately reported) and communicating results to the patient/ family/caregiver care coordination (not separately reported)  Note by: Gaspar Cola, MD Date: 10/20/2021; Time: 7:22 AM

## 2021-10-19 NOTE — Telephone Encounter (Signed)
Okay to proceed with orders  Nobie Putnam, [facility] 10/19/2021, 4:53 PM

## 2021-10-19 NOTE — Telephone Encounter (Signed)
Home Health Verbal Orders - Caller/Agency: Anne Ng from [facility]/ she says she will fax over request also Callback Number: [phone] Requesting PCS sevices Frequency: ?

## 2021-10-20 ENCOUNTER — Ambulatory Visit: Payer: Medicare HMO | Attending: Pain Medicine | Admitting: Pain Medicine

## 2021-10-20 ENCOUNTER — Encounter: Payer: Self-pay | Admitting: Pain Medicine

## 2021-10-20 VITALS — BP 124/82 | HR 75 | Temp 97.7°F | Ht 72.0 in | Wt 339.0 lb

## 2021-10-20 DIAGNOSIS — M1712 Unilateral primary osteoarthritis, left knee: Secondary | ICD-10-CM | POA: Insufficient documentation

## 2021-10-20 DIAGNOSIS — M5441 Lumbago with sciatica, right side: Secondary | ICD-10-CM | POA: Insufficient documentation

## 2021-10-20 DIAGNOSIS — Z79891 Long term (current) use of opiate analgesic: Secondary | ICD-10-CM | POA: Diagnosis present

## 2021-10-20 DIAGNOSIS — G8929 Other chronic pain: Secondary | ICD-10-CM | POA: Diagnosis not present

## 2021-10-20 DIAGNOSIS — M79605 Pain in left leg: Secondary | ICD-10-CM | POA: Insufficient documentation

## 2021-10-20 DIAGNOSIS — R937 Abnormal findings on diagnostic imaging of other parts of musculoskeletal system: Secondary | ICD-10-CM | POA: Insufficient documentation

## 2021-10-20 DIAGNOSIS — M1612 Unilateral primary osteoarthritis, left hip: Secondary | ICD-10-CM | POA: Insufficient documentation

## 2021-10-20 DIAGNOSIS — M79604 Pain in right leg: Secondary | ICD-10-CM | POA: Insufficient documentation

## 2021-10-20 DIAGNOSIS — M25512 Pain in left shoulder: Secondary | ICD-10-CM | POA: Diagnosis not present

## 2021-10-20 DIAGNOSIS — G894 Chronic pain syndrome: Secondary | ICD-10-CM | POA: Diagnosis not present

## 2021-10-20 DIAGNOSIS — M1611 Unilateral primary osteoarthritis, right hip: Secondary | ICD-10-CM | POA: Diagnosis not present

## 2021-10-20 DIAGNOSIS — M5442 Lumbago with sciatica, left side: Secondary | ICD-10-CM | POA: Diagnosis not present

## 2021-10-20 DIAGNOSIS — M160 Bilateral primary osteoarthritis of hip: Secondary | ICD-10-CM | POA: Diagnosis not present

## 2021-10-20 DIAGNOSIS — Z79899 Other long term (current) drug therapy: Secondary | ICD-10-CM | POA: Diagnosis not present

## 2021-10-20 DIAGNOSIS — R6889 Other general symptoms and signs: Secondary | ICD-10-CM | POA: Diagnosis not present

## 2021-10-20 MED ORDER — OXYCODONE HCL 10 MG PO TABS
10.0000 mg | ORAL_TABLET | Freq: Two times a day (BID) | ORAL | 0 refills | Status: DC
Start: 2021-11-25 — End: 2021-10-20

## 2021-10-20 MED ORDER — OXYCODONE HCL 10 MG PO TABS: 10.0000 mg | ORAL_TABLET | Freq: Two times a day (BID) | ORAL | 0 refills | Status: DC

## 2021-10-20 MED ORDER — NALOXONE HCL 4 MG/0.1ML NA LIQD: 1.0000 | NASAL | 0 refills | Status: DC | PRN

## 2021-10-20 NOTE — Progress Notes (Signed)
Nursing Pain Medication Assessment:  Safety precautions to be maintained throughout the outpatient stay will include: orient to surroundings, keep bed in low position, maintain call bell within reach at all times, provide assistance with transfer out of bed and ambulation.   Medication Inspection Compliance: James [name] did not comply with our request to bring his pills to be counted. He was reminded that bringing the medication bottles, even when empty, is a requirement.  Medication: None brought in. Pill/Patch Count: None available to be counted. Bottle Appearance: No container available. Did not bring bottle(s) to appointment. Filled Date: N/A Last Medication intake:  Today

## 2021-10-20 NOTE — Patient Instructions (Addendum)
____________________________________________________________________________________________  Pharmacy Shortages of Pain Medication   Introduction Shockingly as it may seem, .  "No U.[place] Supreme Court decision has ever interpreted the Constitution as guaranteeing a right to health care for all Americans." - https://huff.com/  "With respect to human rights, the [place] has no formally codified right to health, nor does it participate in a human rights treaty that specifies a right to health." - Scott J. Schweikart, JD, MBE  Situation By now, most of our patients have had the experience of being told by their pharmacist that they do not have enough medication to cover their prescription. If you have not had this experience, just know that you soon will.  Problem There appears to be a shortage of these medications, either at the national level or locally. This is happening with all pharmacies. When there is not enough medication, patients are offered a partial fill and they are told that they will try to get the rest of the medicine for them at a later time. If they do not have enough for even a partial fill, the pharmacists are telling the patients to call us (the prescribing physicians) to request that we send another prescription to another pharmacy to get the medicine.   This reordering of a controlled substance creates documentation problems where additional paperwork needs to be created to explain why two prescriptions for the same period of time and the same medicine are being prescribed to the same patient. It also creates situations where the last appointment note does not accurately reflect when and what prescriptions were given to a patient. This leads to prescribing errors down the line, in subsequent follow-up visits.   [employer] of Pharmacy ([employer]) Research revealed that Surveyor, quantity .1806 (21  NCAC 46.1806) authorizes pharmacists to the transfer of prescriptions among pharmacies, and it sets forth procedural and recordkeeping requirements for doing so. However, this requires the pharmacist to complete the previously mentioned procedural paperwork to accomplish the transfer. As it turns out, it is much easier for them to have the prescribing physicians do the work.   Possible solutions 1. You can ask your physician to assist you in weaning yourself off these medications. 2. Ask your pharmacy if the medication is in stock, 3 days prior to your refill. 3. If you need a pharmacy change, let us know at your medication management visit. Prescriptions that have already been electronically sent to a pharmacy will not be re-sent to a different pharmacy if your pharmacy of record does not have it in stock. Proper stocking of medication is a pharmacy problem, not a prescriber problem. Work with your pharmacist to solve the problem. 4. Have the [facility] Assembly add a provision to the "STOP ACT" (the law that mandates how controlled substances are prescribed) where there is an exception to the electronic prescribing rule that states that in the event there are shortages of medications the physicians are allowed to use written prescriptions as opposed to electronic ones. This would allow patients to take their prescriptions to a different pharmacy that may have enough medication available to fill the prescription. The problem is that currently there is a law that does not allow for written prescriptions, with the exception of instances where the electronic medical record is down due to technical issues.  5. Have [place] Congress ease the pressure on pharmaceutical companies, allowing them to produce enough quantities of the medication to adequately supply the population. 6. Have pharmacies keep enough  stocks of these medications to cover their client base.  7. Have the [facility] Assembly add  a provision to the "STOP ACT" where they ease the regulations surrounding the transfer of controlled substances between pharmacies, so as to simplify the transfer of supplies. As an alternative, develop a system to allow patients to obtain the remainder of their prescription at another one of their pharmacies or at an associate pharmacy.   How this shortage will affect you.  Understand that this is a pharmacy supply problem, not a prescriber problem. Work with your pharmacy to solve it. The job of the prescriber is to evaluate and monitor the patient for the appropriate indications and use of these medicines. It is not the job of the prescriber to supply the medication or to solve problems with that supply. The responsibility and the choice to obtain the medication resides on the patient. By law, supplying the medication is the job of the pharmacy. It is certainly not the job of the prescriber to solve supply problems.   Due to the above problems we are no longer taking patients to write for their pain medication. Future discussions with your physician may include potentially weaning medications or transitioning to alternatives.  We will be focusing primarily on interventional based pain management. We will continue to evaluate for appropriate indications and we may provide recommendations regarding medication, dose, and schedule, as well as monitoring recommendations, however, we will not be taking over the actual prescribing of these substances. On those patients where we are treating their chronic pain with interventional therapies, exceptions will be considered on a case by case basis. At this time, we will try to continue providing this supplemental service to those patients we have been managing in the past. However, as of August 1st, 2023, we no longer will be sending additional prescriptions to other pharmacies for the purpose of solving their supply problems. Once we send a prescription to a pharmacy,  we will not be resending it again to another pharmacy to cover for their shortages.   What to do. Write as many letters as you can. Recruit the help of family members in writing these letters. Below are some of the places where you can write to make your voice heard. Let them know what the problem is and push them to look for solutions.   Search internet for: "[employer] find your legislators" NoseSwap.is  Search internet for: "[employer] commissioner complaints" Starlas.fi  Search internet for: "[place] complaints" https://www.hernandez-brewer.com/.htm  Search internet for: "[facility] pharmacy complaints" Email [facility] Customer Relations woondaal.com.jsp?callType=store  Search internet for: Programme researcher, broadcasting/film/video customer service complaints" https://www.[facility].com/topic/marketing/contactus/contactus_customerservice.jsp  ____________________________________________________________________________________________  ____________________________________________________________________________________________  Medication Rules  Purpose: To inform patients, and their family members, of our rules and regulations.  Applies to: All patients receiving prescriptions (written or electronic).  Pharmacy of record: Pharmacy where electronic prescriptions will be sent. If written prescriptions are taken to a different pharmacy, please inform the nursing staff. The pharmacy listed in the electronic medical record should be the one where you would like electronic prescriptions to be sent.  Electronic prescriptions: In compliance with the [place] (STOP) Act of 2017 (Session Lanny Cramp [phone]), effective January 17, 2018, all controlled substances must be electronically prescribed. Calling prescriptions  to the pharmacy will cease to exist.  Prescription refills: Only during scheduled appointments. Applies to all prescriptions.  NOTE: The following applies primarily to controlled substances (Opioid* Pain Medications).  Type of encounter (visit): For patients receiving controlled substances, face-to-face visits are required. (Not an option or up to the patient.)  Patient's responsibilities: Pain Pills: Bring all pain pills to every appointment (except for procedure appointments). Pill Bottles: Bring pills in original pharmacy bottle. Always bring the newest bottle. Bring bottle, even if empty. Medication refills: You are responsible for knowing and keeping track of what medications you take and those you need refilled. The day before your appointment: write a list of all prescriptions that need to be refilled. The day of the appointment: give the list to the admitting nurse. Prescriptions will be written only during appointments. No prescriptions will be written on procedure days. If you forget a medication: it will not be "Called in", "Faxed", or "electronically sent". You will need to get another appointment to get these prescribed. No early refills. Do not call asking to have your prescription filled early. Prescription Accuracy: You are responsible for carefully inspecting your prescriptions before leaving our office. Have the discharge nurse carefully go over each prescription with you, before taking them home. Make sure that your name is accurately spelled, that your address is correct. Check the name and dose of your medication to make sure it is accurate. Check the number of pills, and the written instructions to make sure they are clear and accurate. Make sure that you are given enough medication to last until your next medication refill appointment. Taking Medication: Take medication as prescribed. When it comes to controlled substances, taking less pills or less frequently than prescribed  is permitted and encouraged. Never take more pills than instructed. Never take medication more frequently than prescribed.  Inform other Doctors: Always inform, all of your healthcare providers, of all the medications you take. Pain Medication from other Providers: You are not allowed to accept any additional pain medication from any other Doctor or Healthcare provider. There are two exceptions to this rule. (see below) In the event that you require additional pain medication, you are responsible for notifying us, as stated below. Cough Medicine: Often these contain an opioid, such as codeine or hydrocodone. Never accept or take cough medicine containing these opioids if you are already taking an opioid* medication. The combination may cause respiratory failure and death. Medication Agreement: You are responsible for carefully reading and following our Medication Agreement. This must be signed before receiving any prescriptions from our practice. Safely store a copy of your signed Agreement. Violations to the Agreement will result in no further prescriptions. (Additional copies of our Medication Agreement are available upon request.) Laws, Rules, & Regulations: All patients are expected to follow all Federal and [employer], [employer], Rules, [employer]. Ignorance of the Laws does not constitute a valid excuse.  Illegal drugs and Controlled Substances: The use of illegal substances (including, but not limited to marijuana and its derivatives) and/or the illegal use of any controlled substances is strictly prohibited. Violation of this rule may result in the immediate and permanent discontinuation of any and all prescriptions being written by our practice. The use of any illegal substances is prohibited. Adopted CDC guidelines & recommendations: Target dosing levels will be at or below 60 MME/day. Use of benzodiazepines** is not recommended.  Exceptions: There are only two exceptions to the rule of not  receiving pain medications from other Healthcare Providers. Exception #1 (Emergencies): In the event of an emergency (i.e.: accident requiring emergency care), you are allowed to receive additional pain medication. However, you are responsible for: As soon as  you are able, call our office [phone], at any time of the day or night, and leave a message stating your name, the date and nature of the emergency, and the name and dose of the medication prescribed. In the event that your call is answered by a member of our staff, make sure to document and save the date, time, and the name of the person that took your information.  Exception #2 (Planned Surgery): In the event that you are scheduled by another doctor or dentist to have any type of surgery or procedure, you are allowed (for a period no longer than 30 days), to receive additional pain medication, for the acute post-op pain. However, in this case, you are responsible for picking up a copy of our "Post-op Pain Management for Surgeons" handout, and giving it to your surgeon or dentist. This document is available at our office, and does not require an appointment to obtain it. Simply go to our office during business hours (Monday-Thursday from 8:00 AM to 4:00 PM) (Friday 8:00 AM to 12:00 Noon) or if you have a scheduled appointment with [place], prior to your surgery, and ask for it by name. In addition, you are responsible for: calling our office [phone], at any time of the day or night, and leaving a message stating your name, name of your surgeon, type of surgery, and date of procedure or surgery. Failure to comply with your responsibilities may result in termination of therapy involving the controlled substances. Medication Agreement Violation. Following the above rules, including your responsibilities will help you in avoiding a Medication Agreement Violation ("Breaking your Pain Medication Contract").  *Opioid medications include: morphine,  codeine, oxycodone, oxymorphone, hydrocodone, hydromorphone, meperidine, tramadol, tapentadol, buprenorphine, fentanyl, methadone. **Benzodiazepine medications include: diazepam (Valium), alprazolam (Xanax), clonazepam (Klonopine), lorazepam (Ativan), clorazepate (Tranxene), chlordiazepoxide (Librium), estazolam (Prosom), oxazepam (Serax), temazepam (Restoril), triazolam (Halcion) (Last updated: 10/14/2020) ____________________________________________________________________________________________  ____________________________________________________________________________________________  Medication Recommendations and Reminders  Applies to: All patients receiving prescriptions (written and/or electronic).  Medication Rules & Regulations: These rules and regulations exist for your safety and that of others. They are not flexible and neither are we. Dismissing or ignoring them will be considered "non-compliance" with medication therapy, resulting in complete and irreversible termination of such therapy. (See document titled "Medication Rules" for more details.) In all conscience, because of safety reasons, we cannot continue providing a therapy where the patient does not follow instructions.  Pharmacy of record:  Definition: This is the pharmacy where your electronic prescriptions will be sent.  We do not endorse any particular pharmacy, however, we have experienced problems with [facility] not securing enough medication supply for the community. We do not restrict you in your choice of pharmacy. However, once we write for your prescriptions, we will NOT be re-sending more prescriptions to fix restricted supply problems created by your pharmacy, or your insurance.  The pharmacy listed in the electronic medical record should be the one where you want electronic prescriptions to be sent. If you choose to change pharmacy, simply notify our nursing staff.  Recommendations: Keep all of your pain  medications in a safe place, under lock and key, even if you live alone. We will NOT replace lost, stolen, or damaged medication. After you fill your prescription, take 1 week's worth of pills and put them away in a safe place. You should keep a separate, properly labeled bottle for this purpose. The remainder should be kept in the original bottle. Use this as your primary supply, until  it runs out. Once it's gone, then you know that you have 1 week's worth of medicine, and it is time to come in for a prescription refill. If you do this correctly, it is unlikely that you will ever run out of medicine. To make sure that the above recommendation works, it is very important that you make sure your medication refill appointments are scheduled at least 1 week before you run out of medicine. To do this in an effective manner, make sure that you do not leave the office without scheduling your next medication management appointment. Always ask the nursing staff to show you in your prescription , when your medication will be running out. Then arrange for the receptionist to get you a return appointment, at least 7 days before you run out of medicine. Do not wait until you have 1 or 2 pills left, to come in. This is very poor planning and does not take into consideration that we may need to cancel appointments due to bad weather, sickness, or emergencies affecting our staff. DO NOT ACCEPT A "Partial Fill": If for any reason your pharmacy does not have enough pills/tablets to completely fill or refill your prescription, do not allow for a "partial fill". The law allows the pharmacy to complete that prescription within 72 hours, without requiring a new prescription. If they do not fill the rest of your prescription within those 72 hours, you will need a separate prescription to fill the remaining amount, which we will NOT provide. If the reason for the partial fill is your insurance, you will need to talk to the pharmacist  about payment alternatives for the remaining tablets, but again, DO NOT ACCEPT A PARTIAL FILL, unless you can trust your pharmacist to obtain the remainder of the pills within 72 hours.  Prescription refills and/or changes in medication(s):  Prescription refills, and/or changes in dose or medication, will be conducted only during scheduled medication management appointments. (Applies to both, written and electronic prescriptions.) No refills on procedure days. No medication will be changed or started on procedure days. No changes, adjustments, and/or refills will be conducted on a procedure day. Doing so will interfere with the diagnostic portion of the procedure. No phone refills. No medications will be "called into the pharmacy". No Fax refills. No weekend refills. No Holliday refills. No after hours refills.  Remember:  Business hours are:  Monday to Thursday 8:00 AM to 4:00 PM Provider's Schedule: Milinda Pointer, MD - Appointments are:  Medication management: Monday and Wednesday 8:00 AM to 4:00 PM Procedure day: Tuesday and Thursday 7:30 AM to 4:00 PM Gillis Santa, MD - Appointments are:  Medication management: Tuesday and Thursday 8:00 AM to 4:00 PM Procedure day: Monday and Wednesday 7:30 AM to 4:00 PM (Last update: 08/07/2019) ____________________________________________________________________________________________  ____________________________________________________________________________________________  CBD (cannabidiol) & Delta-8 (Delta-8 tetrahydrocannabinol) WARNING  Intro: Cannabidiol (CBD) and tetrahydrocannabinol (THC), are two natural compounds found in plants of the Cannabis genus. They can both be extracted from hemp or cannabis. Hemp and cannabis come from the Cannabis sativa plant. Both compounds interact with your body's endocannabinoid system, but they have very different effects. CBD does not produce the high sensation associated with cannabis. Delta-8  tetrahydrocannabinol, also known as delta-8 THC, is a psychoactive substance found in the Cannabis sativa plant, of which marijuana and hemp are two varieties. THC is responsible for the high associated with the illicit use of marijuana.  Applicable to: All individuals currently taking or considering taking CBD (cannabidiol) and,  more important, all patients taking opioid analgesic controlled substances (pain medication). (Example: oxycodone; oxymorphone; hydrocodone; hydromorphone; morphine; methadone; tramadol; tapentadol; fentanyl; buprenorphine; butorphanol; dextromethorphan; meperidine; codeine; etc.)  Legal status: CBD remains a Schedule I drug prohibited for any use. CBD is illegal with one exception. In the [place], CBD has a limited Transport planner (FDA) approval for the treatment of two specific types of epilepsy disorders. Only one CBD product has been approved by the FDA for this purpose: "Epidiolex". FDA is aware that some companies are marketing products containing cannabis and cannabis-derived compounds in ways that violate the [employer], Drug and Cosmetic Act [facility] Act) and that may put the health and safety of consumers at risk. The FDA, a Federal agency, has not enforced the CBD status since 2018. UPDATE: (03/05/2021) The Drug Enforcement Agency ([place]) issued a letter stating that "delta" cannabinoids, including Delta-8-THCO and Delta-9-THCO, synthetically derived from hemp do not qualify as hemp and will be viewed as Schedule I drugs. (Schedule I drugs, substances, or chemicals are defined as drugs with no currently accepted medical use and a high potential for abuse. Some examples of Schedule I drugs are: heroin, lysergic acid diethylamide (LSD), marijuana (cannabis), 3,4-methylenedioxymethamphetamine (ecstasy), methaqualone, and peyote.) (https://jennings.com/)  Legality: Some manufacturers ship CBD products nationally, which is illegal. Often such products are sold  online and are therefore available throughout the country. CBD is openly sold in head shops and health food stores in some states where such sales have not been explicitly legalized. Selling unapproved products with unsubstantiated therapeutic claims is not only a violation of the law, but also can put patients at risk, as these products have not been proven to be safe or effective. Federal illegality makes it difficult to conduct research on CBD.  Reference: "FDA Regulation of Cannabis and Cannabis-Derived Products, Including Cannabidiol (CBD)" - SeekArtists.com.pt  Warning: CBD is not FDA approved and has not undergo the same manufacturing controls as prescription drugs.  This means that the purity and safety of available CBD may be questionable. Most of the time, despite manufacturer's claims, it is contaminated with THC (delta-9-tetrahydrocannabinol - the chemical in marijuana responsible for the "HIGH").  When this is the case, the [facility] contaminant will trigger a positive urine drug screen (UDS) test for Marijuana (carboxy-THC). Because a positive UDS for any illicit substance is a violation of our medication agreement, your opioid analgesics (pain medicine) may be permanently discontinued. The FDA recently put out a warning about 5 things that everyone should be aware of regarding Delta-8 THC: Delta-8 THC products have not been evaluated or approved by the FDA for safe use and may be marketed in ways that put the public health at risk. The FDA has received adverse event reports involving delta-8 THC-containing products. Delta-8 THC has psychoactive and intoxicating effects. Delta-8 THC manufacturing often involve use of potentially harmful chemicals to create the concentrations of delta-8 THC claimed in the marketplace. The final delta-8 THC product may have potentially harmful by-products  (contaminants) due to the chemicals used in the process. Manufacturing of delta-8 THC products may occur in uncontrolled or unsanitary settings, which may lead to the presence of unsafe contaminants or other potentially harmful substances. Delta-8 THC products should be kept out of the reach of children and pets.  MORE ABOUT CBD  General Information: CBD was discovered in [birth year] and it is a derivative of the cannabis sativa genus plants (Marijuana and Hemp). It is one of the 113 identified substances found in Marijuana.  It accounts for up to 40% of the plant's extract. As of 2018, preliminary clinical studies on CBD included research for the treatment of anxiety, movement disorders, and pain. CBD is available and consumed in multiple forms, including inhalation of smoke or vapor, as an aerosol spray, and by mouth. It may be supplied as an oil containing CBD, capsules, dried cannabis, or as a liquid solution. CBD is thought not to be as psychoactive as THC (delta-9-tetrahydrocannabinol - the chemical in marijuana responsible for the "HIGH"). Studies suggest that CBD may interact with different biological target receptors in the body, including cannabinoid and other neurotransmitter receptors. As of 2018 the mechanism of action for its biological effects has not been determined.  Side-effects  Adverse reactions: Dry mouth, diarrhea, decreased appetite, fatigue, drowsiness, malaise, weakness, sleep disturbances, and others.  Drug interactions: CBC may interact with other medications such as blood-thinners. Because CBD causes drowsiness on its own, it also increases the drowsiness caused by other medications, including antihistamines (such as Benadryl), benzodiazepines (Xanax, Ativan, Valium), antipsychotics, antidepressants and opioids, as well as alcohol and supplements such as kava, melatonin and St. John's Wort. Be cautious with the following combinations:   Brivaracetam (Briviact) Brivaracetam is changed  and broken down by the body. CBD might decrease how quickly the body breaks down brivaracetam. This might increase levels of brivaracetam in the body.  Caffeine Caffeine is changed and broken down by the body. CBD might decrease how quickly the body breaks down caffeine. This might increase levels of caffeine in the body.  Carbamazepine (Tegretol) Carbamazepine is changed and broken down by the body. CBD might decrease how quickly the body breaks down carbamazepine. This might increase levels of carbamazepine in the body and increase its side effects.  Citalopram (Celexa) Citalopram is changed and broken down by the body. CBD might decrease how quickly the body breaks down citalopram. This might increase levels of citalopram in the body and increase its side effects.  Clobazam (Onfi) Clobazam is changed and broken down by the liver. CBD might decrease how quickly the liver breaks down clobazam. This might increase the effects and side effects of clobazam.  Eslicarbazepine (Aptiom) Eslicarbazepine is changed and broken down by the body. CBD might decrease how quickly the body breaks down eslicarbazepine. This might increase levels of eslicarbazepine in the body by a small amount.  Everolimus (Zostress) Everolimus is changed and broken down by the body. CBD might decrease how quickly the body breaks down everolimus. This might increase levels of everolimus in the body.  Lithium Taking higher doses of CBD might increase levels of lithium. This can increase the risk of lithium toxicity.  Medications changed by the liver (Cytochrome P450 1A1 (CYP1A1) substrates) Some medications are changed and broken down by the liver. CBD might change how quickly the liver breaks down these medications. This could change the effects and side effects of these medications.  Medications changed by the liver (Cytochrome P450 1A2 (CYP1A2) substrates) Some medications are changed and broken down by the liver. CBD  might change how quickly the liver breaks down these medications. This could change the effects and side effects of these medications.  Medications changed by the liver (Cytochrome P450 1B1 (CYP1B1) substrates) Some medications are changed and broken down by the liver. CBD might change how quickly the liver breaks down these medications. This could change the effects and side effects of these medications.  Medications changed by the liver (Cytochrome P450 2A6 (CYP2A6) substrates) Some medications are changed and  broken down by the liver. CBD might change how quickly the liver breaks down these medications. This could change the effects and side effects of these medications.  Medications changed by the liver (Cytochrome P450 2B6 (CYP2B6) substrates) Some medications are changed and broken down by the liver. CBD might change how quickly the liver breaks down these medications. This could change the effects and side effects of these medications.  Medications changed by the liver (Cytochrome P450 2C19 (CYP2C19) substrates) Some medications are changed and broken down by the liver. CBD might change how quickly the liver breaks down these medications. This could change the effects and side effects of these medications.  Medications changed by the liver (Cytochrome P450 2C8 (CYP2C8) substrates) Some medications are changed and broken down by the liver. CBD might change how quickly the liver breaks down these medications. This could change the effects and side effects of these medications.  Medications changed by the liver (Cytochrome P450 2C9 (CYP2C9) substrates) Some medications are changed and broken down by the liver. CBD might change how quickly the liver breaks down these medications. This could change the effects and side effects of these medications.  Medications changed by the liver (Cytochrome P450 2D6 (CYP2D6) substrates) Some medications are changed and broken down by the liver. CBD might  change how quickly the liver breaks down these medications. This could change the effects and side effects of these medications.  Medications changed by the liver (Cytochrome P450 2E1 (CYP2E1) substrates) Some medications are changed and broken down by the liver. CBD might change how quickly the liver breaks down these medications. This could change the effects and side effects of these medications.  Medications changed by the liver (Cytochrome P450 3A4 (CYP3A4) substrates) Some medications are changed and broken down by the liver. CBD might change how quickly the liver breaks down these medications. This could change the effects and side effects of these medications.  Medications changed by the liver (Glucuronidated drugs) Some medications are changed and broken down by the liver. CBD might change how quickly the liver breaks down these medications. This could change the effects and side effects of these medications.  Medications that decrease the breakdown of other medications by the liver (Cytochrome P450 2C19 (CYP2C19) inhibitors) CBD is changed and broken down by the liver. Some drugs decrease how quickly the liver changes and breaks down CBD. This could change the effects and side effects of CBD.  Medications that decrease the breakdown of other medications in the liver (Cytochrome P450 3A4 (CYP3A4) inhibitors) CBD is changed and broken down by the liver. Some drugs decrease how quickly the liver changes and breaks down CBD. This could change the effects and side effects of CBD.  Medications that increase breakdown of other medications by the liver (Cytochrome P450 3A4 (CYP3A4) inducers) CBD is changed and broken down by the liver. Some drugs increase how quickly the liver changes and breaks down CBD. This could change the effects and side effects of CBD.  Medications that increase the breakdown of other medications by the liver (Cytochrome P450 2C19 (CYP2C19) inducers) CBD is changed and  broken down by the liver. Some drugs increase how quickly the liver changes and breaks down CBD. This could change the effects and side effects of CBD.  Methadone (Dolophine) Methadone is broken down by the liver. CBD might decrease how quickly the liver breaks down methadone. Taking cannabidiol along with methadone might increase the effects and side effects of methadone.  Rufinamide (Banzel) Rufinamide is  changed and broken down by the body. CBD might decrease how quickly the body breaks down rufinamide. This might increase levels of rufinamide in the body by a small amount.  Sedative medications (CNS depressants) CBD might cause sleepiness and slowed breathing. Some medications, called sedatives, can also cause sleepiness and slowed breathing. Taking CBD with sedative medications might cause breathing problems and/or too much sleepiness.  Sirolimus (Rapamune) Sirolimus is changed and broken down by the body. CBD might decrease how quickly the body breaks down sirolimus. This might increase levels of sirolimus in the body.  Stiripentol (Diacomit) Stiripentol is changed and broken down by the body. CBD might decrease how quickly the body breaks down stiripentol. This might increase levels of stiripentol in the body and increase its side effects.  Tacrolimus (Prograf) Tacrolimus is changed and broken down by the body. CBD might decrease how quickly the body breaks down tacrolimus. This might increase levels of tacrolimus in the body.  Tamoxifen (Soltamox) Tamoxifen is changed and broken down by the body. CBD might affect how quickly the body breaks down tamoxifen. This might affect levels of tamoxifen in the body.  Topiramate (Topamax) Topiramate is changed and broken down by the body. CBD might decrease how quickly the body breaks down topiramate. This might increase levels of topiramate in the body by a small amount.  Valproate Valproic acid can cause liver injury. Taking cannabidiol  with valproic acid might increase the chance of liver injury. CBD and/or valproic acid might need to be stopped, or the dose might need to be reduced.  Warfarin (Coumadin) CBD might increase levels of warfarin, which can increase the risk for bleeding. CBD and/or warfarin might need to be stopped, or the dose might need to be reduced.  Zonisamide Zonisamide is changed and broken down by the body. CBD might decrease how quickly the body breaks down zonisamide. This might increase levels of zonisamide in the body by a small amount. (Last update: 03/17/2021) ____________________________________________________________________________________________  ____________________________________________________________________________________________  Drug Holidays (Slow)  What is a "Drug Holiday"? Drug Holiday: is the name given to the period of time during which a patient stops taking a medication(s) for the purpose of eliminating tolerance to the drug.  Benefits Improved effectiveness of opioids. Decreased opioid dose needed to achieve benefits. Improved pain with lesser dose.  What is tolerance? Tolerance: is the progressive decreased in effectiveness of a drug due to its repetitive use. With repetitive use, the body gets use to the medication and as a consequence, it loses its effectiveness. This is a common problem seen with opioid pain medications. As a result, a larger dose of the drug is needed to achieve the same effect that used to be obtained with a smaller dose.  How long should a "Drug Holiday" last? You should stay off of the pain medicine for at least 14 consecutive days. (2 weeks)  Should I stop the medicine "cold [place]"? No. You should always coordinate with your Pain Specialist so that he/she can provide you with the correct medication dose to make the transition as smoothly as possible.  How do I stop the medicine? Slowly. You will be instructed to decrease the daily amount of  pills that you take by one (1) pill every seven (7) days. This is called a "slow downward taper" of your dose. For example: if you normally take four (4) pills per day, you will be asked to drop this dose to three (3) pills per day for seven (7) days, then to  two (2) pills per day for seven (7) days, then to one (1) per day for seven (7) days, and at the end of those last seven (7) days, this is when the "Drug Holiday" would start.   Will I have withdrawals? By doing a "slow downward taper" like this one, it is unlikely that you will experience any significant withdrawal symptoms. Typically, what triggers withdrawals is the sudden stop of a high dose opioid therapy. Withdrawals can usually be avoided by slowly decreasing the dose over a prolonged period of time. If you do not follow these instructions and decide to stop your medication abruptly, withdrawals may be possible.  What are withdrawals? Withdrawals: refers to the wide range of symptoms that occur after stopping or dramatically reducing opiate drugs after heavy and prolonged use. Withdrawal symptoms do not occur to patients that use low dose opioids, or those who take the medication sporadically. Contrary to benzodiazepine (example: Valium, Xanax, etc.) or alcohol withdrawals ("Delirium Tremens"), opioid withdrawals are not lethal. Withdrawals are the physical manifestation of the body getting rid of the excess receptors.  Expected Symptoms Early symptoms of withdrawal may include: Agitation Anxiety Muscle aches Increased tearing Insomnia Runny nose Sweating Yawning  Late symptoms of withdrawal may include: Abdominal cramping Diarrhea Dilated pupils Goose bumps Nausea Vomiting  Will I experience withdrawals? Due to the slow nature of the taper, it is very unlikely that you will experience any.  What is a slow taper? Taper: refers to the gradual decrease in dose.  (Last update:  08/07/2019) ____________________________________________________________________________________________   Naloxone Nasal Spray What is this medication? NALOXONE (nal OX one) treats opioid overdose, which causes slow or shallow breathing, severe drowsiness, or trouble staying awake. Call emergency services after using this medication. You may need additional treatment. Naloxone works by reversing the effects of opioids. It belongs to a group of medications called opioid blockers. This medicine may be used for other purposes; ask your health care provider or pharmacist if you have questions. COMMON BRAND NAME(S): Kloxxado, Narcan What should I tell my care team before I take this medication? They need to know if you have any of these conditions: Heart disease Substance use disorder An unusual or allergic reaction to naloxone, other medications, foods, dyes, or preservatives Pregnant or trying to get pregnant Breast-feeding How should I use this medication? This medication is for use in the nose. Lay the person on their back. Support their neck with your hand and allow the head to tilt back before giving the medication. The nasal spray should be given into 1 nostril. After giving the medication, move the person onto their side. Do not remove or test the nasal spray until ready to use. Get emergency medical help right away after giving the first dose of this medication, even if the person wakes up. You should be familiar with how to recognize the signs and symptoms of a narcotic overdose. If more doses are needed, give the additional dose in the other nostril. Talk to your care team about the use of this medication in children. While this medication may be prescribed for children as young as newborns for selected conditions, precautions do apply. Overdosage: If you think you have taken too much of this medicine contact a poison control center or emergency room at once. NOTE: This medicine is only  for you. Do not share this medicine with others. What if I miss a dose? This does not apply. What may interact with this medication? This is only used  during an emergency. No interactions are expected during emergency use. This list may not describe all possible interactions. Give your health care provider a list of all the medicines, herbs, non-prescription drugs, or dietary supplements you use. Also tell them if you smoke, drink alcohol, or use illegal drugs. Some items may interact with your medicine. What should I watch for while using this medication? Keep this medication ready for use in the case of an opioid overdose. Make sure that you have the phone number of your care team and local hospital ready. You may need to have additional doses of this medication. Each nasal spray contains a single dose. Some emergencies may require additional doses. After use, bring the treated person to the nearest hospital or call 911. Make sure the treating care team knows that the person has received a dose of this medication. You will receive additional instructions on what to do during and after use of this medication before an emergency occurs. What side effects may I notice from receiving this medication? Side effects that you should report to your care team as soon as possible: Allergic reactions--skin rash, itching, hives, swelling of the face, lips, tongue, or throat Side effects that usually do not require medical attention (report these to your care team if they continue or are bothersome): Constipation Dryness or irritation inside the nose Headache Increase in blood pressure Muscle spasms Stuffy nose Toothache This list may not describe all possible side effects. Call your doctor for medical advice about side effects. You may report side effects to FDA at 1-800-FDA-1088. Where should I keep my medication? Keep out of the reach of children and pets. Store between 20 and 25 degrees C (68 and 77  degrees F). Do not freeze. Throw away any unused medication after the expiration date. Keep in original box until ready to use. NOTE: This sheet is a summary. It may not cover all possible information. If you have questions about this medicine, talk to your doctor, pharmacist, or health care provider.  2023 Elsevier/Gold Standard (2020-11-30 00:00:00) ______________________________________________________________________  Preparing for Procedure with Sedation  NOTICE: Due to recent regulatory changes, starting on August 17, 2020, procedures requiring intravenous (IV) sedation will no longer be performed at the [facility].  These types of procedures are required to be performed at [facility] ambulatory surgery facility.  We are very sorry for the inconvenience.  Procedure appointments are limited to planned procedures: No Prescription Refills. No disability issues will be discussed. No medication changes will be discussed.  Instructions: Oral Intake: Do not eat or drink anything for at least 8 hours prior to your procedure. (Exception: Blood Pressure Medication. See below.) Transportation: A driver is required. You may not drive yourself after the procedure. Blood Pressure Medicine: Do not forget to take your blood pressure medicine with a sip of water the morning of the procedure. If your Diastolic (lower reading) is above 100 mmHg, elective cases will be cancelled/rescheduled. Blood thinners: These will need to be stopped for procedures. Notify our staff if you are taking any blood thinners. Depending on which one you take, there will be specific instructions on how and when to stop it. Diabetics on insulin: Notify the staff so that you can be scheduled 1st case in the morning. If your diabetes requires high dose insulin, take only  of your normal insulin dose the morning of the procedure and notify the staff that you have done so. Preventing infections: Shower with an antibacterial soap  the morning  of your procedure. Build-up your immune system: Take 1000 mg of Vitamin C with every meal (3 times a day) the day prior to your procedure. Antibiotics: Inform the staff if you have a condition or reason that requires you to take antibiotics before dental procedures. Pregnancy: If you are pregnant, call and cancel the procedure. Sickness: If you have a cold, fever, or any active infections, call and cancel the procedure. Arrival: You must be in the facility at least 30 minutes prior to your scheduled procedure. Children: Do not bring children with you. Dress appropriately: There is always the possibility that your clothing may get soiled. Valuables: Do not bring any jewelry or valuables.  Reasons to call and reschedule or cancel your procedure: (Following these recommendations will minimize the risk of a serious complication.) Surgeries: Avoid having procedures within 2 weeks of any surgery. (Avoid for 2 weeks before or after any surgery). Flu Shots: Avoid having procedures within 2 weeks of a flu shots. (Avoid for 2 weeks before or after immunizations). Barium: Avoid having a procedure within 7-10 days after having had a radiological study involving the use of radiological contrast. (Myelograms, Barium swallow or enema study). Heart attacks: Avoid any elective procedures or surgeries for the initial 6 months after a "Myocardial Infarction" (Heart Attack). Blood thinners: It is imperative that you stop these medications before procedures. Let us know if you if you take any blood thinner.  Infection: Avoid procedures during or within two weeks of an infection (including chest colds or gastrointestinal problems). Symptoms associated with infections include: Localized redness, fever, chills, night sweats or profuse sweating, burning sensation when voiding, cough, congestion, stuffiness, runny nose, sore throat, diarrhea, nausea, vomiting, cold or Flu symptoms, recent or current infections. It  is specially important if the infection is over the area that we intend to treat. Heart and lung problems: Symptoms that may suggest an active cardiopulmonary problem include: cough, chest pain, breathing difficulties or shortness of breath, dizziness, ankle swelling, uncontrolled high or unusually low blood pressure, and/or palpitations. If you are experiencing any of these symptoms, cancel your procedure and contact your primary care physician for an evaluation.  Remember:  Regular Business hours are:  Monday to Thursday 8:00 AM to 4:00 PM  Provider's Schedule: Milinda Pointer, MD:  Procedure days: Tuesday and Thursday 7:30 AM to 4:00 PM  Gillis Santa, MD:  Procedure days: Monday and Wednesday 7:30 AM to 4:00 PM ______________________________________________________________________ ______________________________________________________________________  Preparing for Procedure with Sedation  NOTICE: Due to recent regulatory changes, starting on August 17, 2020, procedures requiring intravenous (IV) sedation will no longer be performed at the [facility].  These types of procedures are required to be performed at [facility] ambulatory surgery facility.  We are very sorry for the inconvenience.  Procedure appointments are limited to planned procedures: No Prescription Refills. No disability issues will be discussed. No medication changes will be discussed.  Instructions: Oral Intake: Do not eat or drink anything for at least 8 hours prior to your procedure. (Exception: Blood Pressure Medication. See below.) Transportation: A driver is required. You may not drive yourself after the procedure. Blood Pressure Medicine: Do not forget to take your blood pressure medicine with a sip of water the morning of the procedure. If your Diastolic (lower reading) is above 100 mmHg, elective cases will be cancelled/rescheduled. Blood thinners: These will need to be stopped for procedures. Notify our  staff if you are taking any blood thinners. Depending on which one you take, there  will be specific instructions on how and when to stop it. Diabetics on insulin: Notify the staff so that you can be scheduled 1st case in the morning. If your diabetes requires high dose insulin, take only  of your normal insulin dose the morning of the procedure and notify the staff that you have done so. Preventing infections: Shower with an antibacterial soap the morning of your procedure. Build-up your immune system: Take 1000 mg of Vitamin C with every meal (3 times a day) the day prior to your procedure. Antibiotics: Inform the staff if you have a condition or reason that requires you to take antibiotics before dental procedures. Pregnancy: If you are pregnant, call and cancel the procedure. Sickness: If you have a cold, fever, or any active infections, call and cancel the procedure. Arrival: You must be in the facility at least 30 minutes prior to your scheduled procedure. Children: Do not bring children with you. Dress appropriately: There is always the possibility that your clothing may get soiled. Valuables: Do not bring any jewelry or valuables.  Reasons to call and reschedule or cancel your procedure: (Following these recommendations will minimize the risk of a serious complication.) Surgeries: Avoid having procedures within 2 weeks of any surgery. (Avoid for 2 weeks before or after any surgery). Flu Shots: Avoid having procedures within 2 weeks of a flu shots. (Avoid for 2 weeks before or after immunizations). Barium: Avoid having a procedure within 7-10 days after having had a radiological study involving the use of radiological contrast. (Myelograms, Barium swallow or enema study). Heart attacks: Avoid any elective procedures or surgeries for the initial 6 months after a "Myocardial Infarction" (Heart Attack). Blood thinners: It is imperative that you stop these medications before procedures. Let us  know if you if you take any blood thinner.  Infection: Avoid procedures during or within two weeks of an infection (including chest colds or gastrointestinal problems). Symptoms associated with infections include: Localized redness, fever, chills, night sweats or profuse sweating, burning sensation when voiding, cough, congestion, stuffiness, runny nose, sore throat, diarrhea, nausea, vomiting, cold or Flu symptoms, recent or current infections. It is specially important if the infection is over the area that we intend to treat. Heart and lung problems: Symptoms that may suggest an active cardiopulmonary problem include: cough, chest pain, breathing difficulties or shortness of breath, dizziness, ankle swelling, uncontrolled high or unusually low blood pressure, and/or palpitations. If you are experiencing any of these symptoms, cancel your procedure and contact your primary care physician for an evaluation.  Remember:  Regular Business hours are:  Monday to Thursday 8:00 AM to 4:00 PM  Provider's Schedule: Milinda Pointer, MD:  Procedure days: Tuesday and Thursday 7:30 AM to 4:00 PM  Gillis Santa, MD:  Procedure days: Monday and Wednesday 7:30 AM to 4:00 PM ______________________________________________________________________  ____________________________________________________________________________________________  General Risks and Possible Complications  Patient Responsibilities: It is important that you read this as it is part of your informed consent. It is our duty to inform you of the risks and possible complications associated with treatments offered to you. It is your responsibility as a patient to read this and to ask questions about anything that is not clear or that you believe was not covered in this document.  Patient's Rights: You have the right to refuse treatment. You also have the right to change your mind, even after initially having agreed to have the treatment done.  However, under this last option, if you wait until the last  second to change your mind, you may be charged for the materials used up to that point.  Introduction: Medicine is not an Chief Strategy Officer. Everything in Medicine, including the lack of treatment(s), carries the potential for danger, harm, or loss (which is by definition: Risk). In Medicine, a complication is a secondary problem, condition, or disease that can aggravate an already existing one. All treatments carry the risk of possible complications. The fact that a side effects or complications occurs, does not imply that the treatment was conducted incorrectly. It must be clearly understood that these can happen even when everything is done following the highest safety standards.  No treatment: You can choose not to proceed with the proposed treatment alternative. The "PRO(s)" would include: avoiding the risk of complications associated with the therapy. The "CON(s)" would include: not getting any of the treatment benefits. These benefits fall under one of three categories: diagnostic; therapeutic; and/or palliative. Diagnostic benefits include: getting information which can ultimately lead to improvement of the disease or symptom(s). Therapeutic benefits are those associated with the successful treatment of the disease. Finally, palliative benefits are those related to the decrease of the primary symptoms, without necessarily curing the condition (example: decreasing the pain from a flare-up of a chronic condition, such as incurable terminal cancer).  General Risks and Complications: These are associated to most interventional treatments. They can occur alone, or in combination. They fall under one of the following six (6) categories: no benefit or worsening of symptoms; bleeding; infection; nerve damage; allergic reactions; and/or death. No benefits or worsening of symptoms: In Medicine there are no guarantees, only probabilities. No healthcare  provider can ever guarantee that a medical treatment will work, they can only state the probability that it may. Furthermore, there is always the possibility that the condition may worsen, either directly, or indirectly, as a consequence of the treatment. Bleeding: This is more common if the patient is taking a blood thinner, either prescription or over the counter (example: Goody Powders, Fish oil, Aspirin, Garlic, etc.), or if suffering a condition associated with impaired coagulation (example: Hemophilia, cirrhosis of the liver, low platelet counts, etc.). However, even if you do not have one on these, it can still happen. If you have any of these conditions, or take one of these drugs, make sure to notify your treating physician. Infection: This is more common in patients with a compromised immune system, either due to disease (example: diabetes, cancer, human immunodeficiency virus [HIV], etc.), or due to medications or treatments (example: therapies used to treat cancer and rheumatological diseases). However, even if you do not have one on these, it can still happen. If you have any of these conditions, or take one of these drugs, make sure to notify your treating physician. Nerve Damage: This is more common when the treatment is an invasive one, but it can also happen with the use of medications, such as those used in the treatment of cancer. The damage can occur to small secondary nerves, or to large primary ones, such as those in the spinal cord and brain. This damage may be temporary or permanent and it may lead to impairments that can range from temporary numbness to permanent paralysis and/or brain death. Allergic Reactions: Any time a substance or material comes in contact with our body, there is the possibility of an allergic reaction. These can range from a mild skin rash (contact dermatitis) to a severe systemic reaction (anaphylactic reaction), which can result in death. Death: In general,  any  medical intervention can result in death, most of the time due to an unforeseen complication. ____________________________________________________________________________________________  ______________________________________________________________________________________________  Body mass index (BMI)  Body mass index (BMI) is a common tool for deciding whether a person has an appropriate body weight.  It measures a persons weight in relation to their height.   According to the [facility] of health (NIH): A BMI of less than 18.5 means that a person is underweight. A BMI of between 18.5 and 24.9 is ideal. A BMI of between 25 and 29.9 is overweight. A BMI over 30 indicates obesity.  Weight Management Required  URGENT: Your weight has been found to be adversely affecting your health.  Dear Mr. Coll:  Your current Estimated body mass index is 45.98 kg/m as calculated from the following:   Height as of this encounter: 6' (1.829 m).   Weight as of this encounter: 339 lb (153.8 kg).  Please use the table below to identify your weight category and associated incidence of chronic pain, secondary to your weight.  Body Mass Index (BMI) Classification BMI level (kg/m2) Category Associated incidence of chronic pain  <18  Underweight   18.5-24.9 Ideal body weight   25-29.9 Overweight  20%  30-34.9 Obese (Class I)  68%  35-39.9 Severe obesity (Class II)  136%  >40 Extreme obesity (Class III)  254%   In addition: You will be considered "Morbidly Obese", if your BMI is above 30 and you have one or more of the following conditions which are known to be caused and/or directly associated with obesity: 1.    Type 2 Diabetes (Which in turn can lead to cardiovascular diseases (CVD), stroke, peripheral vascular diseases (PVD), retinopathy, nephropathy, and neuropathy) 2.    Cardiovascular Disease (High Blood Pressure; Congestive Heart Failure; High Cholesterol; Coronary Artery Disease;  Angina; or History of Heart Attacks) 3.    Breathing problems (Asthma; obesity-hypoventilation syndrome; obstructive sleep apnea; chronic inflammatory airway disease; reactive airway disease; or shortness of breath) 4.    Chronic kidney disease 5.    Liver disease (nonalcoholic fatty liver disease) 6.    High blood pressure 7.    Acid reflux (gastroesophageal reflux disease; heartburn) 8.    Osteoarthritis (OA) (with any of the following: hip pain; knee pain; and/or low back pain) 9.    Low back pain (Lumbar Facet Syndrome; and/or Degenerative Disc Disease) 10.  Hip pain (Osteoarthritis of hip) (For every 1 lbs of added body weight, there is a 2 lbs increase in pressure inside of each hip articulation. 1:2 mechanical relationship) 11.  Knee pain (Osteoarthritis of knee) (For every 1 lbs of added body weight, there is a 4 lbs increase in pressure inside of each knee articulation. 1:4 mechanical relationship) (patients with a BMI>30 kg/m2 were 6.8 times more likely to develop knee OA than normal-weight individuals) 12.  Cancer: Epidemiological studies have shown that obesity is a risk factor for: post-menopausal breast cancer; cancers of the endometrium, colon and kidney cancer; malignant adenomas of the oesophagus. Obese subjects have an approximately 1.5-3.5-fold increased risk of developing these cancers compared with normal-weight subjects, and it has been estimated that between 15 and 45% of these cancers can be attributed to overweight. More recent studies suggest that obesity may also increase the risk of other types of cancer, including pancreatic, hepatic and gallbladder cancer. (Ref: Obesity and cancer. Pischon T, Nthlings U, [employer] H. Proc Nutr Soc. 2008 May;67(2):128-45. doi: 95.1884/Z6606301601093235.) The International Agency for Research on Cancer (IARC) has  identified 13 cancers associated with overweight and obesity: meningioma, multiple myeloma, adenocarcinoma of the esophagus, and cancers  of the thyroid, postmenopausal breast cancer, gallbladder, stomach, liver, pancreas, kidney, ovaries, uterus, colon and rectal (colorectal) cancers. 56 percent of all cancers diagnosed in women and 24 percent of those diagnosed in men are associated with overweight and obesity.  Recommendation: At this point it is urgent that you take a step back and concentrate in loosing weight. Dedicate 100% of your efforts on this task. Nothing else will improve your health more than bringing your weight down and your BMI to less than 30. If you are here, you probably have chronic pain. We know that most chronic pain patients have difficulty exercising secondary to their pain. For this reason, you must rely on proper nutrition and diet in order to lose the weight. If your BMI is above 40, you should seriously consider bariatric surgery. A realistic goal is to lose 10% of your body weight over a period of 12 months.  Be honest to yourself, if over time you have unsuccessfully tried to lose weight, then it is time for you to seek professional help and to enter a medically supervised weight management program, and/or undergo bariatric surgery. Stop procrastinating.   Pain management considerations:  1.    Pharmacological Problems: Be advised that the use of opioid analgesics (oxycodone; hydrocodone; morphine; methadone; codeine; and all of their derivatives) have been associated with decreased metabolism and weight gain.  For this reason, should we see that you are unable to lose weight while taking these medications, it may become necessary for [place] to taper down and indefinitely discontinue them.  2.    Technical Problems: The incidence of successful interventional therapies decreases as the patient's BMI increases. It is much more difficult to accomplish a safe and effective interventional therapy on a patient with a BMI above 35. 3.    Radiation Exposure Problems: The x-rays machine, used to accomplish injection therapies,  will automatically increase their x-ray output in order to capture an appropriate bone image. This means that radiation exposure increases exponentially with the patient's BMI. (The higher the BMI, the higher the radiation exposure.) Although the level of radiation used at a given time is still safe to the patient, it is not for the physician and/or assisting staff. Unfortunately, radiation exposure is accumulative. Because physicians and the staff have to do procedures and be exposed on a daily basis, this can result in health problems such as cancer and radiation burns. Radiation exposure to the staff is monitored by the radiation batches that they wear. The exposure levels are reported back to the staff on a quarterly basis. Depending on levels of exposure, physicians and staff may be obligated by law to decrease this exposure. This means that they have the right and obligation to refuse providing therapies where they may be overexposed to radiation. For this reason, physicians may decline to offer therapies such as radiofrequency ablation or implants to patients with a BMI above 40. 4.    Current Trends: Be advised that the current trend is to no longer offer certain therapies to patients with a BMI equal to, or above 35, due to increase perioperative risks, increased technical procedural difficulties, and excessive radiation exposure to healthcare personnel.  ______________________________________________________________________________________________

## 2021-10-20 NOTE — Telephone Encounter (Signed)
Annette advised.  She states she didn't need a verbal order.  She only wanted to let you know she was faxing a form for this pt.   Thanks,   -Mickel Baas

## 2021-10-22 ENCOUNTER — Telehealth: Payer: Self-pay | Admitting: Pain Medicine

## 2021-10-22 NOTE — Telephone Encounter (Signed)
Patient states [facility] on [address] does not have his scripts for Oxycodone and Narcan. I called the pharmacy, they have both. Attempted to call patient, no answer,  mailbox full.

## 2021-10-22 NOTE — Telephone Encounter (Signed)
Patient notified that both scripts are at [facility] on [address].

## 2021-10-22 NOTE — Telephone Encounter (Signed)
PT stated that he went [facility] to pick up prescription and was told that prescription hadn't been call in. PT stated that the prescription should be call in to [facility] on [address] in [place]. PT asked to give him a call. Thanks

## 2021-10-25 ENCOUNTER — Ambulatory Visit: Payer: No Typology Code available for payment source | Admitting: Family Medicine

## 2021-10-28 ENCOUNTER — Telehealth: Payer: Self-pay | Admitting: Podiatry

## 2021-10-28 NOTE — Telephone Encounter (Signed)
Pt is calling to check the status of his diabetic shoes. States he order these about 3 months ago. Sent secure chat with Christan to check status for pt.   I advise pt that someone will return his call with a status update  Please advise.

## 2021-10-29 DIAGNOSIS — R6889 Other general symptoms and signs: Secondary | ICD-10-CM | POA: Diagnosis not present

## 2021-10-30 NOTE — Progress Notes (Deleted)
PROVIDER NOTE: Interpretation of information contained herein should be left to medically-trained personnel. Specific patient instructions are provided elsewhere under "Patient Instructions" section of medical record. This document was created in part using STT-dictation technology, any transcriptional errors that may result from this process are unintentional.  Patient: James [name] Type: Established DOB: [birth year] MRN: [id number] PCP: Olin Hauser, DO  Service: Procedure DOS: 11/04/2021 Setting: Ambulatory Location: Ambulatory outpatient facility Delivery: Face-to-face Provider: Gaspar Cola, MD Specialty: Interventional Pain Management Specialty designation: 09 Location: Outpatient facility Ref. Prov.: Nobie Putnam *    Primary Reason for Visit: Interventional Pain Management Treatment. CC: No chief complaint on file.   Procedure:           Type: Lumbar epidural steroid injection (LESI) (interlaminar) #1    Laterality: Right   Level:  L3-4 Level.  Imaging: Fluoroscopic guidance         Anesthesia: Local anesthesia (1-2% Lidocaine) Anxiolysis: None                 Sedation:                         DOS: 11/04/2021  Performed by: Gaspar Cola, MD  Purpose: Diagnostic/Therapeutic Indications: Lumbar radicular pain of intraspinal etiology of more than 4 weeks that has failed to respond to conservative therapy and is severe enough to impact quality of life or function. 1. DDD (degenerative disc disease), lumbar   2. Herniated nucleus pulposus, L3-4 (Right)   3. Lumbar lateral recess stenosis (Right: L3-4) (Bilateral: L4-5)   4. 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 Lumbar nerve root impingement (Right: L4)   6. 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 Chronic lower extremity pain (1ry area of Pain) (Bilateral)   9. Weakness of lower  extremities (Bilateral)   10. Lumbar facet arthropathy (Multilevel) (L1-2 through L5-S1) (Bilateral)   11. Abnormal MRI, lumbar spine (05/21/2021)   12. Morbid obesity with BMI of 45.0-49.9, adult ([facility])    NAS-11 Pain score:   Pre-procedure:  /10   Post-procedure:  /10      Position / Prep / Materials:  Position: Prone w/ head of the table raised (slight reverse trendelenburg) to facilitate breathing.  Prep solution: DuraPrep (Iodine Povacrylex [0.7% available iodine] and Isopropyl Alcohol, 74% w/w) Prep Area: Entire Posterior Lumbar Region from lower scapular tip down to mid buttocks area and from flank to flank. Materials:  Tray: Epidural tray Needle(s):  Type: Epidural needle (Tuohy) Gauge (G):  17 Length: Regular (3.5-in) Qty: 1  Pre-op H&P Assessment:  James [name] is a 61 y.o. (year old), male patient, seen today for interventional treatment. He  has a past surgical history that includes Colonoscopy with propofol; Appendectomy; and Colonoscopy with propofol (N/A, 04/23/2021). James [name] has a current medication list which includes the following prescription(s): albuterol, atorvastatin, hydrochlorothiazide, losartan, metformin, naloxone, oxycodone hcl, ozempic (1 mg/dose), pregabalin, and varenicline. His primarily concern today is the No chief complaint on file.  Initial Vital Signs:  Pulse/HCG Rate:    Temp:   Resp:   BP:   SpO2:    BMI: Estimated body mass index is 45.98 kg/m as calculated from the following:   Height as of 10/20/21: 6' (1.829 m).   Weight as of 10/20/21: 339 lb (153.8 kg).  Risk Assessment: Allergies: Reviewed. He has No Known Allergies.  Allergy Precautions: None  required Coagulopathies: Reviewed. None identified.  Blood-thinner therapy: None at this time Active Infection(s): Reviewed. None identified. James [name] is afebrile  Site Confirmation: James [name] was asked to confirm the procedure and laterality before marking the  site Procedure checklist: Completed Consent: Before the procedure and under the influence of no sedative(s), amnesic(s), or anxiolytics, the patient was informed of the treatment options, risks and possible complications. To fulfill our ethical and legal obligations, as recommended by the American Medical Association's Code of Ethics, I have informed the patient of my clinical impression; the nature and purpose of the treatment or procedure; the risks, benefits, and possible complications of the intervention; the alternatives, including doing nothing; the risk(s) and benefit(s) of the alternative treatment(s) or procedure(s); and the risk(s) and benefit(s) of doing nothing. The patient was provided information about the general risks and possible complications associated with the procedure. These may include, but are not limited to: failure to achieve desired goals, infection, bleeding, organ or nerve damage, allergic reactions, paralysis, and death. In addition, the patient was informed of those risks and complications associated to Spine-related procedures, such as failure to decrease pain; infection (i.e.: Meningitis, epidural or intraspinal abscess); bleeding (i.e.: epidural hematoma, subarachnoid hemorrhage, or any other type of intraspinal or peri-dural bleeding); organ or nerve damage (i.e.: Any type of peripheral nerve, nerve root, or spinal cord injury) with subsequent damage to sensory, motor, and/or autonomic systems, resulting in permanent pain, numbness, and/or weakness of one or several areas of the body; allergic reactions; (i.e.: anaphylactic reaction); and/or death. Furthermore, the patient was informed of those risks and complications associated with the medications. These include, but are not limited to: allergic reactions (i.e.: anaphylactic or anaphylactoid reaction(s)); adrenal axis suppression; blood sugar elevation that in diabetics may result in ketoacidosis or comma; water retention  that in patients with history of congestive heart failure may result in shortness of breath, pulmonary edema, and decompensation with resultant heart failure; weight gain; swelling or edema; medication-induced neural toxicity; particulate matter embolism and blood vessel occlusion with resultant organ, and/or nervous system infarction; and/or aseptic necrosis of one or more joints. Finally, the patient was informed that Medicine is not an exact science; therefore, there is also the possibility of unforeseen or unpredictable risks and/or possible complications that may result in a catastrophic outcome. The patient indicated having understood very clearly. We have given the patient no guarantees and we have made no promises. Enough time was given to the patient to ask questions, all of which were answered to the patient's satisfaction. James [name] has indicated that he wanted to continue with the procedure. Attestation: I, the ordering provider, attest that I have discussed with the patient the benefits, risks, side-effects, alternatives, likelihood of achieving goals, and potential problems during recovery for the procedure that I have provided informed consent. Date  Time: {CHL [facility]-PAIN TIME CHOICES:21018001}  Pre-Procedure Preparation:  Monitoring: As per clinic protocol. Respiration, ETCO2, SpO2, BP, heart rate and rhythm monitor placed and checked for adequate function Safety Precautions: Patient was assessed for positional comfort and pressure points before starting the procedure. Time-out: I initiated and conducted the "Time-out" before starting the procedure, as per protocol. The patient was asked to participate by confirming the accuracy of the "Time Out" information. Verification of the correct person, site, and procedure were performed and confirmed by me, the nursing staff, and the patient. "Time-out" conducted as per Joint Commission's Universal Protocol (UP.01.01.01). Time:     Description/Narrative of Procedure:  Target: Epidural space via interlaminar opening, initially targeting the lower laminar border of the superior vertebral body. Region: Lumbar Approach: Percutaneous paravertebral  Rationale (medical necessity): procedure needed and proper for the diagnosis and/or treatment of the patient's medical symptoms and needs. Procedural Technique Safety Precautions: Aspiration looking for blood return was conducted prior to all injections. At no point did we inject any substances, as a needle was being advanced. No attempts were made at seeking any paresthesias. Safe injection practices and needle disposal techniques used. Medications properly checked for expiration dates. SDV (single dose vial) medications used. Description of the Procedure: Protocol guidelines were followed. The procedure needle was introduced through the skin, ipsilateral to the reported pain, and advanced to the target area. Bone was contacted and the needle walked caudad, until the lamina was cleared. The epidural space was identified using "loss-of-resistance technique" with 2-3 ml of PF-NaCl (0.9% NSS), in a 5cc LOR glass syringe.  There were no vitals filed for this visit.  Start Time:   hrs. End Time:   hrs.  Imaging Guidance (Spinal):          Type of Imaging Technique: Fluoroscopy Guidance (Spinal) Indication(s): Assistance in needle guidance and placement for procedures requiring needle placement in or near specific anatomical locations not easily accessible without such assistance. Exposure Time: Please see nurses notes. Contrast: Before injecting any contrast, we confirmed that the patient did not have an allergy to iodine, shellfish, or radiological contrast. Once satisfactory needle placement was completed at the desired level, radiological contrast was injected. Contrast injected under live fluoroscopy. No contrast complications. See chart for type and volume of contrast  used. Fluoroscopic Guidance: I was personally present during the use of fluoroscopy. "Tunnel Vision Technique" used to obtain the best possible view of the target area. Parallax error corrected before commencing the procedure. "Direction-depth-direction" technique used to introduce the needle under continuous pulsed fluoroscopy. Once target was reached, antero-posterior, oblique, and lateral fluoroscopic projection used confirm needle placement in all planes. Images permanently stored in EMR. Interpretation: I personally interpreted the imaging intraoperatively. Adequate needle placement confirmed in multiple planes. Appropriate spread of contrast into desired area was observed. No evidence of afferent or efferent intravascular uptake. No intrathecal or subarachnoid spread observed. Permanent images saved into the patient's record.  Antibiotic Prophylaxis:   Anti-infectives (From admission, onward)    None      Indication(s): None identified  Post-operative Assessment:  Post-procedure Vital Signs:  Pulse/HCG Rate:    Temp:   Resp:   BP:   SpO2:    EBL: None  Complications: No immediate post-treatment complications observed by team, or reported by patient.  Note: The patient tolerated the entire procedure well. A repeat set of vitals were taken after the procedure and the patient was kept under observation following institutional policy, for this type of procedure. Post-procedural neurological assessment was performed, showing return to baseline, prior to discharge. The patient was provided with post-procedure discharge instructions, including a section on how to identify potential problems. Should any problems arise concerning this procedure, the patient was given instructions to immediately contact us, at any time, without hesitation. In any case, we plan to contact the patient by telephone for a follow-up status report regarding this interventional procedure.  Comments:  No additional  relevant information.  Plan of Care  Orders:  No orders of the defined types were placed in this encounter.  Chronic Opioid Analgesic:  Oxycodone/APAP 10/325 (# 60/month), 1 tab p.o. twice daily (last filled  on 02/03/2021) Needs to loose an average of 2.75 lbs/mo to be considered for opioid pharmacotherapy.  MME/day: 30 mg/day   Medications ordered for procedure: No orders of the defined types were placed in this encounter.  Medications administered: Collin Rengel had no medications administered during this visit.  See the medical record for exact dosing, route, and time of administration.  Follow-up plan:   No follow-ups on file.       Interventional Therapies  Risk  Complexity Considerations:   Estimated body mass index is 45.98 kg/m down from 50.97 kg/m as calculated from the following:   Height as of this encounter: 6' (1.829 m).   Weight as of this encounter: 339 lbs down from 375 lb 12.8 oz (170.5 kg). Our goal is to bring his BMI down to 30 kg/m (220 lbs for his height). He is supposed to lose 2.75 lbs/month. WNL   Planned  Pending:   Diagnostic/therapeutic right L3-4 LESI #1  Notes will be requested from the [place] and [place].   Under consideration:   Diagnostic/therapeutic left IA AC + glenohumeral joint inj. #1  Diagnostic/therapeutic left suprascapular NB #1  Diagnostic/therapeutic bilateral IA hip inj. #1  Diagnostic/therapeutic bilateral IA steroid knee inj. #1  Therapeutic bilateral IA viscosupplementation knee inj. #1  Diagnostic/therapeutic bilateral lumbar facet MBB #1    Completed:   Diagnostic/therapeutic right L4-5 LESI x1 (06/21/2021) by Dr. Gillis Santa (100/20/0/0)  Toradol/Norflex IM 60/60 mg (03/29/2021)  Referral to orthopedic surgery for evaluation of knees and hip (03/29/2021)  Referral to medical weight management (02/15/2021)  Referral to bariatric surgery (02/15/2021)    Completed by other providers:   [place]  [facility]  EMG/PNCV (LE) by Gurney Maxin, MD [facility] neurology) (03/16/2021) (chronic, severe sensorimotor polyneuropathy)   Therapeutic  Palliative (PRN) options:   None established     Recent Visits Date Type Provider Dept  10/20/21 Office Visit Milinda Pointer, MD [facility]  Showing recent visits within past 90 days and meeting all other requirements Future Appointments Date Type Provider Dept  11/04/21 Appointment Milinda Pointer, MD [facility]  11/15/21 Appointment Milinda Pointer, MD [facility]  Showing future appointments within next 90 days and meeting all other requirements  Disposition: Discharge home  Discharge (Date  Time): 11/04/2021;   hrs.   Primary Care Physician: Olin Hauser, DO Location: [facility] Note by: Gaspar Cola, MD Date: 11/04/2021; Time: 7:19 AM  Disclaimer:  Medicine is not an Chief Strategy Officer. The only guarantee in medicine is that nothing is guaranteed. It is important to note that the decision to proceed with this intervention was based on the information collected from the patient. The Data and conclusions were drawn from the patient's questionnaire, the interview, and the physical examination. Because the information was provided in large part by the patient, it cannot be guaranteed that it has not been purposely or unconsciously manipulated. Every effort has been made to obtain as much relevant data as possible for this evaluation. It is important to note that the conclusions that lead to this procedure are derived in large part from the available data. Always take into account that the treatment will also be dependent on availability of resources and existing treatment guidelines, considered by other Pain Management Practitioners as being common knowledge and practice, at the time of the intervention. For Medico-Legal purposes, it is also  important to point out that variation in procedural techniques and  pharmacological choices are the acceptable norm. The indications, contraindications, technique, and results of the above procedure should only be interpreted and judged by a Board-Certified Interventional Pain Specialist with extensive familiarity and expertise in the same exact procedure and technique.

## 2021-11-02 ENCOUNTER — Ambulatory Visit: Payer: Medicare HMO | Admitting: Family Medicine

## 2021-11-03 DIAGNOSIS — M47816 Spondylosis without myelopathy or radiculopathy, lumbar region: Secondary | ICD-10-CM | POA: Insufficient documentation

## 2021-11-03 DIAGNOSIS — M48061 Spinal stenosis, lumbar region without neurogenic claudication: Secondary | ICD-10-CM | POA: Insufficient documentation

## 2021-11-03 DIAGNOSIS — M5126 Other intervertebral disc displacement, lumbar region: Secondary | ICD-10-CM | POA: Insufficient documentation

## 2021-11-03 DIAGNOSIS — M5416 Radiculopathy, lumbar region: Secondary | ICD-10-CM | POA: Insufficient documentation

## 2021-11-03 DIAGNOSIS — D1779 Benign lipomatous neoplasm of other sites: Secondary | ICD-10-CM | POA: Insufficient documentation

## 2021-11-04 ENCOUNTER — Ambulatory Visit
Admission: RE | Admit: 2021-11-04 | Discharge: 2021-11-04 | Disposition: A | Discharge status: Home / Self Care | Payer: Medicare HMO | Source: Ambulatory Visit | Attending: Pain Medicine | Admitting: Pain Medicine

## 2021-11-04 ENCOUNTER — Ambulatory Visit: Payer: Medicare HMO | Attending: Pain Medicine | Admitting: Pain Medicine

## 2021-11-04 ENCOUNTER — Encounter: Payer: Self-pay | Admitting: Pain Medicine

## 2021-11-04 ENCOUNTER — Ambulatory Visit: Payer: Medicare HMO | Admitting: Pain Medicine

## 2021-11-04 VITALS — BP 106/47 | HR 76 | Temp 97.0°F | Resp 18 | Ht 72.0 in | Wt 332.0 lb

## 2021-11-04 DIAGNOSIS — M5416 Radiculopathy, lumbar region: Secondary | ICD-10-CM | POA: Insufficient documentation

## 2021-11-04 DIAGNOSIS — M79604 Pain in right leg: Secondary | ICD-10-CM | POA: Insufficient documentation

## 2021-11-04 DIAGNOSIS — G8929 Other chronic pain: Secondary | ICD-10-CM | POA: Diagnosis present

## 2021-11-04 DIAGNOSIS — M79605 Pain in left leg: Secondary | ICD-10-CM | POA: Diagnosis not present

## 2021-11-04 DIAGNOSIS — M5136 Other intervertebral disc degeneration, lumbar region: Secondary | ICD-10-CM | POA: Insufficient documentation

## 2021-11-04 DIAGNOSIS — M48061 Spinal stenosis, lumbar region without neurogenic claudication: Secondary | ICD-10-CM | POA: Insufficient documentation

## 2021-11-04 DIAGNOSIS — E66813 Obesity, class 3: Secondary | ICD-10-CM | POA: Insufficient documentation

## 2021-11-04 DIAGNOSIS — M5417 Radiculopathy, lumbosacral region: Secondary | ICD-10-CM | POA: Insufficient documentation

## 2021-11-04 DIAGNOSIS — D1779 Benign lipomatous neoplasm of other sites: Secondary | ICD-10-CM | POA: Insufficient documentation

## 2021-11-04 DIAGNOSIS — M5126 Other intervertebral disc displacement, lumbar region: Secondary | ICD-10-CM | POA: Diagnosis not present

## 2021-11-04 DIAGNOSIS — E662 Morbid (severe) obesity with alveolar hypoventilation: Secondary | ICD-10-CM | POA: Insufficient documentation

## 2021-11-04 DIAGNOSIS — Z6841 Body Mass Index (BMI) 40.0 and over, adult: Secondary | ICD-10-CM | POA: Insufficient documentation

## 2021-11-04 DIAGNOSIS — R937 Abnormal findings on diagnostic imaging of other parts of musculoskeletal system: Secondary | ICD-10-CM | POA: Insufficient documentation

## 2021-11-04 DIAGNOSIS — R6889 Other general symptoms and signs: Secondary | ICD-10-CM | POA: Diagnosis not present

## 2021-11-04 MED ORDER — ROPIVACAINE HCL 2 MG/ML IJ SOLN
2.0000 mL | Freq: Once | INTRAMUSCULAR | Status: AC
  Administered 2021-11-04: 2 mL via EPIDURAL

## 2021-11-04 MED ORDER — FENTANYL CITRATE (PF) 100 MCG/2ML IJ SOLN
INTRAMUSCULAR | Status: AC
  Filled 2021-11-04: qty 2

## 2021-11-04 MED ORDER — LACTATED RINGERS IV SOLN: Freq: Once | INTRAVENOUS | Status: AC

## 2021-11-04 MED ORDER — SODIUM CHLORIDE (PF) 0.9 % IJ SOLN
INTRAMUSCULAR | Status: AC
  Filled 2021-11-04: qty 10

## 2021-11-04 MED ORDER — ROPIVACAINE HCL 2 MG/ML IJ SOLN
INTRAMUSCULAR | Status: AC
  Filled 2021-11-04: qty 20

## 2021-11-04 MED ORDER — MIDAZOLAM HCL 5 MG/5ML IJ SOLN
INTRAMUSCULAR | Status: AC
  Filled 2021-11-04: qty 5

## 2021-11-04 MED ORDER — IOHEXOL 180 MG/ML  SOLN
INTRAMUSCULAR | Status: AC
  Filled 2021-11-04: qty 10

## 2021-11-04 MED ORDER — PENTAFLUOROPROP-TETRAFLUOROETH EX AERO
INHALATION_SPRAY | Freq: Once | CUTANEOUS | Status: AC
  Administered 2021-11-04: 30 via TOPICAL

## 2021-11-04 MED ORDER — TRIAMCINOLONE ACETONIDE 40 MG/ML IJ SUSP
INTRAMUSCULAR | Status: AC
  Filled 2021-11-04: qty 1

## 2021-11-04 MED ORDER — TRIAMCINOLONE ACETONIDE 40 MG/ML IJ SUSP
40.0000 mg | Freq: Once | INTRAMUSCULAR | Status: AC
  Administered 2021-11-04: 40 mg

## 2021-11-04 MED ORDER — LIDOCAINE HCL (PF) 2 % IJ SOLN
INTRAMUSCULAR | Status: AC
  Filled 2021-11-04: qty 5

## 2021-11-04 MED ORDER — LIDOCAINE HCL 2 % IJ SOLN
20.0000 mL | Freq: Once | INTRAMUSCULAR | Status: AC
  Administered 2021-11-04: 100 mg

## 2021-11-04 MED ORDER — IOHEXOL 180 MG/ML  SOLN
10.0000 mL | Freq: Once | INTRAMUSCULAR | Status: AC
  Administered 2021-11-04: 10 mL via EPIDURAL

## 2021-11-04 MED ORDER — SODIUM CHLORIDE 0.9% FLUSH
2.0000 mL | Freq: Once | INTRAVENOUS | Status: AC
  Administered 2021-11-04: 2 mL

## 2021-11-04 MED ORDER — MIDAZOLAM HCL 5 MG/5ML IJ SOLN
0.5000 mg | Freq: Once | INTRAMUSCULAR | Status: AC
  Administered 2021-11-04: 1.5 mg via INTRAVENOUS

## 2021-11-04 NOTE — Patient Instructions (Signed)
____________________________________________________________________________________________  Post-Procedure Discharge Instructions  Instructions: Apply ice:  Purpose: This will minimize any swelling and discomfort after procedure.  When: Day of procedure, as soon as you get home. How: Fill a plastic sandwich bag with crushed ice. Cover it with a small towel and apply to injection site. How long: (15 min on, 15 min off) Apply for 15 minutes then remove x 15 minutes.  Repeat sequence on day of procedure, until you go to bed. Apply heat:  Purpose: To treat any soreness and discomfort from the procedure. When: Starting the next day after the procedure. How: Apply heat to procedure site starting the day following the procedure. How long: May continue to repeat daily, until discomfort goes away. Food intake: Start with clear liquids (like water) and advance to regular food, as tolerated.  Physical activities: Keep activities to a minimum for the first 8 hours after the procedure. After that, then as tolerated. Driving: If you have received any sedation, be responsible and do not drive. You are not allowed to drive for 24 hours after having sedation. Blood thinner: (Applies only to those taking blood thinners) You may restart your blood thinner 6 hours after your procedure. Insulin: (Applies only to Diabetic patients taking insulin) As soon as you can eat, you may resume your normal dosing schedule. Infection prevention: Keep procedure site clean and dry. Shower daily and clean area with soap and water. Post-procedure Pain Diary: Extremely important that this be done correctly and accurately. Recorded information will be used to determine the next step in treatment. For the purpose of accuracy, follow these rules: Evaluate only the area treated. Do not report or include pain from an untreated area. For the purpose of this evaluation, ignore all other areas of pain, except for the treated area. After  your procedure, avoid taking a long nap and attempting to complete the pain diary after you wake up. Instead, set your alarm clock to go off every hour, on the hour, for the initial 8 hours after the procedure. Document the duration of the numbing medicine, and the relief you are getting from it. Do not go to sleep and attempt to complete it later. It will not be accurate. If you received sedation, it is likely that you were given a medication that may cause amnesia. Because of this, completing the diary at a later time may cause the information to be inaccurate. This information is needed to plan your care. Follow-up appointment: Keep your post-procedure follow-up evaluation appointment after the procedure (usually 2 weeks for most procedures, 6 weeks for radiofrequencies). DO NOT FORGET to bring you pain diary with you.   Expect: (What should I expect to see with my procedure?) From numbing medicine (AKA: Local Anesthetics): Numbness or decrease in pain. You may also experience some weakness, which if present, could last for the duration of the local anesthetic. Onset: Full effect within 15 minutes of injected. Duration: It will depend on the type of local anesthetic used. On the average, 1 to 8 hours.  From steroids (Applies only if steroids were used): Decrease in swelling or inflammation. Once inflammation is improved, relief of the pain will follow. Onset of benefits: Depends on the amount of swelling present. The more swelling, the longer it will take for the benefits to be seen. In some cases, up to 10 days. Duration: Steroids will stay in the system x 2 weeks. Duration of benefits will depend on multiple posibilities including persistent irritating factors. Side-effects: If present, they  may typically last 2 weeks (the duration of the steroids). Frequent: Cramps (if they occur, drink Gatorade and take over-the-counter Magnesium 450-500 mg once to twice a day); water retention with temporary  weight gain; increases in blood sugar; decreased immune system response; increased appetite. Occasional: Facial flushing (red, warm cheeks); mood swings; menstrual changes. Uncommon: Long-term decrease or suppression of natural hormones; bone thinning. (These are more common with higher doses or more frequent use. This is why we prefer that our patients avoid having any injection therapies in other practices.)  Very Rare: Severe mood changes; psychosis; aseptic necrosis. From procedure: Some discomfort is to be expected once the numbing medicine wears off. This should be minimal if ice and heat are applied as instructed.  Call if: (When should I call?) You experience numbness and weakness that gets worse with time, as opposed to wearing off. New onset bowel or bladder incontinence. (Applies only to procedures done in the spine)  Emergency Numbers: Durning business hours (Monday - Thursday, 8:00 AM - 4:00 PM) (Friday, 9:00 AM - 12:00 Noon): [phone] After hours: [phone] NOTE: If you are having a problem and are unable connect with, or to talk to a provider, then go to your nearest urgent care or emergency department. If the problem is serious and urgent, please call [phone]. ____________________________________________________________________________________________   ______________________________________________________________________________________________  Body mass index (BMI)  Body mass index (BMI) is a common tool for deciding whether a person has an appropriate body weight.  It measures a persons weight in relation to their height.   According to the [facility] of health (NIH): A BMI of less than 18.5 means that a person is underweight. A BMI of between 18.5 and 24.9 is ideal. A BMI of between 25 and 29.9 is overweight. A BMI over 30 indicates obesity.  Weight Management Required  URGENT: Your weight has been found to be adversely affecting your health.  Dear  James [name]:  Your current Estimated body mass index is 45.98 kg/m as calculated from the following:   Height as of 10/20/21: 6' (1.829 m).   Weight as of 10/20/21: 339 lb (153.8 kg).  Please use the table below to identify your weight category and associated incidence of chronic pain, secondary to your weight.  Body Mass Index (BMI) Classification BMI level (kg/m2) Category Associated incidence of chronic pain  <18  Underweight   18.5-24.9 Ideal body weight   25-29.9 Overweight  20%  30-34.9 Obese (Class I)  68%  35-39.9 Severe obesity (Class II)  136%  >40 Extreme obesity (Class III)  254%   In addition: You will be considered "Morbidly Obese", if your BMI is above 30 and you have one or more of the following conditions which are known to be caused and/or directly associated with obesity: 1.    Type 2 Diabetes (Which in turn can lead to cardiovascular diseases (CVD), stroke, peripheral vascular diseases (PVD), retinopathy, nephropathy, and neuropathy) 2.    Cardiovascular Disease (High Blood Pressure; Congestive Heart Failure; High Cholesterol; Coronary Artery Disease; Angina; or History of Heart Attacks) 3.    Breathing problems (Asthma; obesity-hypoventilation syndrome; obstructive sleep apnea; chronic inflammatory airway disease; reactive airway disease; or shortness of breath) 4.    Chronic kidney disease 5.    Liver disease (nonalcoholic fatty liver disease) 6.    High blood pressure 7.    Acid reflux (gastroesophageal reflux disease; heartburn) 8.    Osteoarthritis (OA) (with any of the following: hip pain; knee pain;  and/or low back pain) 9.    Low back pain (Lumbar Facet Syndrome; and/or Degenerative Disc Disease) 10.  Hip pain (Osteoarthritis of hip) (For every 1 lbs of added body weight, there is a 2 lbs increase in pressure inside of each hip articulation. 1:2 mechanical relationship) 11.  Knee pain (Osteoarthritis of knee) (For every 1 lbs of added body weight, there is a  4 lbs increase in pressure inside of each knee articulation. 1:4 mechanical relationship) (patients with a BMI>30 kg/m2 were 6.8 times more likely to develop knee OA than normal-weight individuals) 12.  Cancer: Epidemiological studies have shown that obesity is a risk factor for: post-menopausal breast cancer; cancers of the endometrium, colon and kidney cancer; malignant adenomas of the oesophagus. Obese subjects have an approximately 1.5-3.5-fold increased risk of developing these cancers compared with normal-weight subjects, and it has been estimated that between 15 and 45% of these cancers can be attributed to overweight. More recent studies suggest that obesity may also increase the risk of other types of cancer, including pancreatic, hepatic and gallbladder cancer. (Ref: Obesity and cancer. Pischon T, Nthlings U, [employer] H. Proc Nutr Soc. 2008 May;67(2):128-45. doi: 77.9390/[id number].) The International Agency for Research on Cancer (IARC) has identified 13 cancers associated with overweight and obesity: meningioma, multiple myeloma, adenocarcinoma of the esophagus, and cancers of the thyroid, postmenopausal breast cancer, gallbladder, stomach, liver, pancreas, kidney, ovaries, uterus, colon and rectal (colorectal) cancers. 62 percent of all cancers diagnosed in women and 24 percent of those diagnosed in men are associated with overweight and obesity.  Recommendation: At this point it is urgent that you take a step back and concentrate in loosing weight. Dedicate 100% of your efforts on this task. Nothing else will improve your health more than bringing your weight down and your BMI to less than 30. If you are here, you probably have chronic pain. We know that most chronic pain patients have difficulty exercising secondary to their pain. For this reason, you must rely on proper nutrition and diet in order to lose the weight. If your BMI is above 40, you should seriously consider bariatric surgery. A  realistic goal is to lose 10% of your body weight over a period of 12 months.  Be honest to yourself, if over time you have unsuccessfully tried to lose weight, then it is time for you to seek professional help and to enter a medically supervised weight management program, and/or undergo bariatric surgery. Stop procrastinating.   Pain management considerations:  1.    Pharmacological Problems: Be advised that the use of opioid analgesics (oxycodone; hydrocodone; morphine; methadone; codeine; and all of their derivatives) have been associated with decreased metabolism and weight gain.  For this reason, should we see that you are unable to lose weight while taking these medications, it may become necessary for [place] to taper down and indefinitely discontinue them.  2.    Technical Problems: The incidence of successful interventional therapies decreases as the patient's BMI increases. It is much more difficult to accomplish a safe and effective interventional therapy on a patient with a BMI above 35. 3.    Radiation Exposure Problems: The x-rays machine, used to accomplish injection therapies, will automatically increase their x-ray output in order to capture an appropriate bone image. This means that radiation exposure increases exponentially with the patient's BMI. (The higher the BMI, the higher the radiation exposure.) Although the level of radiation used at a given time is still safe to the patient, it is  not for the physician and/or assisting staff. Unfortunately, radiation exposure is accumulative. Because physicians and the staff have to do procedures and be exposed on a daily basis, this can result in health problems such as cancer and radiation burns. Radiation exposure to the staff is monitored by the radiation batches that they wear. The exposure levels are reported back to the staff on a quarterly basis. Depending on levels of exposure, physicians and staff may be obligated by law to decrease this  exposure. This means that they have the right and obligation to refuse providing therapies where they may be overexposed to radiation. For this reason, physicians may decline to offer therapies such as radiofrequency ablation or implants to patients with a BMI above 40. 4.    Current Trends: Be advised that the current trend is to no longer offer certain therapies to patients with a BMI equal to, or above 35, due to increase perioperative risks, increased technical procedural difficulties, and excessive radiation exposure to healthcare personnel.  ______________________________________________________________________________________________

## 2021-11-04 NOTE — Progress Notes (Signed)
PROVIDER NOTE: Interpretation of information contained herein should be left to medically-trained personnel. Specific patient instructions are provided elsewhere under "Patient Instructions" section of medical record. This document was created in part using STT-dictation technology, any transcriptional errors that may result from this process are unintentional.  Patient: James [name] Type: Established DOB: 1960-10-07 MRN: [id number] PCP: Olin Hauser, DO  Service: Procedure DOS: 11/04/2021 Setting: Ambulatory Location: Ambulatory outpatient facility Delivery: Face-to-face Provider: Gaspar Cola, MD Specialty: Interventional Pain Management Specialty designation: 09 Location: Outpatient facility Ref. Prov.: Nobie Putnam *    Primary Reason for Visit: Interventional Pain Management Treatment. CC: Leg Pain (R>L)   Procedure:           Type: Lumbar epidural steroid injection (LESI) (interlaminar) #1    Laterality: Right   Level:  L3-4 Level.  Imaging: Fluoroscopic guidance         Anesthesia: Local anesthesia (1-2% Lidocaine) Anxiolysis: IV Versed 1.5 mg Sedation: No Sedation                       DOS: 11/04/2021  Performed by: Gaspar Cola, MD  Purpose: Diagnostic/Therapeutic Indications: Lumbar radicular pain of intraspinal etiology of more than 4 weeks that has failed to respond to conservative therapy and is severe enough to impact quality of life or function. 1. Herniated nucleus pulposus, L3-4 (Right)   2. 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 Chronic lower extremity pain (1ry area of Pain) (Bilateral)   4. DDD (degenerative disc disease), lumbar   5. Epidural lipomatosis (L1-2 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)   6. 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 Lumbar nerve root impingement (Right: L4)   9. Lumbosacral radiculopathy at L5  (Left)   10. Abnormal MRI, lumbar spine (05/21/2021)   11. Morbid obesity with BMI of 45.0-49.9, adult ([place])    NAS-11 Pain score:   Pre-procedure: 10-Worst pain ever/10   Post-procedure: 0-No pain/10   On 02/15/21 he was 375 lbs., today he is 332 lbs.(BMI 45.03) he needs to be at BMI of 30.0.    Position / Prep / Materials:  Position: Prone w/ head of the table raised (slight reverse trendelenburg) to facilitate breathing.  Prep solution: DuraPrep (Iodine Povacrylex [0.7% available iodine] and Isopropyl Alcohol, 74% w/w) Prep Area: Entire Posterior Lumbar Region from lower scapular tip down to mid buttocks area and from flank to flank. Materials:  Tray: Epidural tray Needle(s):  Type: Epidural needle (Tuohy) Gauge (G):  17 Length: Regular (3.5-in) Qty: 1  Pre-op H&P Assessment:  [name] is a 61 y.o. (year old), male patient, seen today for interventional treatment. He  has a past surgical history that includes Colonoscopy with propofol; Appendectomy; and Colonoscopy with propofol (N/A, 04/23/2021). [name] has a current medication list which includes the following prescription(s): albuterol, atorvastatin, hydrochlorothiazide, losartan, metformin, naloxone, oxycodone hcl, ozempic (1 mg/dose), pregabalin, and varenicline, and the following Facility-Administered Medications: lactated ringers. His primarily concern today is the Leg Pain (R>L)  Initial Vital Signs:  Pulse/HCG Rate: 76ECG Heart Rate: 75 (NSR) Temp: (!) 97 F (36.1 C) Resp: 18 BP: (!) 112/51 SpO2: 96 %  BMI: Estimated body mass index is 45.03 kg/m as calculated from the following:   Height as of this encounter: 6' (1.829 m).   Weight as of this encounter: 332 lb (150.6 kg).  Risk Assessment: Allergies: Reviewed. He has No  Known Allergies.  Allergy Precautions: None required Coagulopathies: Reviewed. None identified.  Blood-thinner therapy: None at this time Active Infection(s): Reviewed. None  identified. [name] is afebrile  Site Confirmation: [name] was asked to confirm the procedure and laterality before marking the site Procedure checklist: Completed Consent: Before the procedure and under the influence of no sedative(s), amnesic(s), or anxiolytics, the patient was informed of the treatment options, risks and possible complications. To fulfill our ethical and legal obligations, as recommended by the American Medical Association's Code of Ethics, I have informed the patient of my clinical impression; the nature and purpose of the treatment or procedure; the risks, benefits, and possible complications of the intervention; the alternatives, including doing nothing; the risk(s) and benefit(s) of the alternative treatment(s) or procedure(s); and the risk(s) and benefit(s) of doing nothing. The patient was provided information about the general risks and possible complications associated with the procedure. These may include, but are not limited to: failure to achieve desired goals, infection, bleeding, organ or nerve damage, allergic reactions, paralysis, and death. In addition, the patient was informed of those risks and complications associated to Spine-related procedures, such as failure to decrease pain; infection (i.e.: Meningitis, epidural or intraspinal abscess); bleeding (i.e.: epidural hematoma, subarachnoid hemorrhage, or any other type of intraspinal or peri-dural bleeding); organ or nerve damage (i.e.: Any type of peripheral nerve, nerve root, or spinal cord injury) with subsequent damage to sensory, motor, and/or autonomic systems, resulting in permanent pain, numbness, and/or weakness of one or several areas of the body; allergic reactions; (i.e.: anaphylactic reaction); and/or death. Furthermore, the patient was informed of those risks and complications associated with the medications. These include, but are not limited to: allergic reactions (i.e.: anaphylactic or  anaphylactoid reaction(s)); adrenal axis suppression; blood sugar elevation that in diabetics may result in ketoacidosis or comma; water retention that in patients with history of congestive heart failure may result in shortness of breath, pulmonary edema, and decompensation with resultant heart failure; weight gain; swelling or edema; medication-induced neural toxicity; particulate matter embolism and blood vessel occlusion with resultant organ, and/or nervous system infarction; and/or aseptic necrosis of one or more joints. Finally, the patient was informed that Medicine is not an exact science; therefore, there is also the possibility of unforeseen or unpredictable risks and/or possible complications that may result in a catastrophic outcome. The patient indicated having understood very clearly. We have given the patient no guarantees and we have made no promises. Enough time was given to the patient to ask questions, all of which were answered to the patient's satisfaction. Mr. Borchardt has indicated that he wanted to continue with the procedure. Attestation: I, the ordering provider, attest that I have discussed with the patient the benefits, risks, side-effects, alternatives, likelihood of achieving goals, and potential problems during recovery for the procedure that I have provided informed consent. Date  Time: 11/04/2021  8:54 AM  Pre-Procedure Preparation:  Monitoring: As per clinic protocol. Respiration, ETCO2, SpO2, BP, heart rate and rhythm monitor placed and checked for adequate function Safety Precautions: Patient was assessed for positional comfort and pressure points before starting the procedure. Time-out: I initiated and conducted the "Time-out" before starting the procedure, as per protocol. The patient was asked to participate by confirming the accuracy of the "Time Out" information. Verification of the correct person, site, and procedure were performed and confirmed by me, the nursing  staff, and the patient. "Time-out" conducted as per Joint Commission's Universal Protocol (UP.01.01.01). Time: 0950  Description/Narrative of  Procedure:          Target: Epidural space via interlaminar opening, initially targeting the lower laminar border of the superior vertebral body. Region: Lumbar Approach: Percutaneous paravertebral  Rationale (medical necessity): procedure needed and proper for the diagnosis and/or treatment of the patient's medical symptoms and needs. Procedural Technique Safety Precautions: Aspiration looking for blood return was conducted prior to all injections. At no point did we inject any substances, as a needle was being advanced. No attempts were made at seeking any paresthesias. Safe injection practices and needle disposal techniques used. Medications properly checked for expiration dates. SDV (single dose vial) medications used. Description of the Procedure: Protocol guidelines were followed. The procedure needle was introduced through the skin, ipsilateral to the reported pain, and advanced to the target area. Bone was contacted and the needle walked caudad, until the lamina was cleared. The epidural space was identified using "loss-of-resistance technique" with 2-3 ml of PF-NaCl (0.9% NSS), in a 5cc LOR glass syringe.  Vitals:   11/04/21 0957 11/04/21 1000 11/04/21 1005 11/04/21 1015  BP: (!) 138/100 136/76 (!) 106/47   Pulse:      Resp:      Temp:      SpO2: 98% 98% 97% 100%  Weight:      Height:        Start Time: 0950 hrs. End Time: 0958 hrs.  Imaging Guidance (Spinal):          Type of Imaging Technique: Fluoroscopy Guidance (Spinal) Indication(s): Assistance in needle guidance and placement for procedures requiring needle placement in or near specific anatomical locations not easily accessible without such assistance. Exposure Time: Please see nurses notes. Contrast: Before injecting any contrast, we confirmed that the patient did not have an  allergy to iodine, shellfish, or radiological contrast. Once satisfactory needle placement was completed at the desired level, radiological contrast was injected. Contrast injected under live fluoroscopy. No contrast complications. See chart for type and volume of contrast used. Fluoroscopic Guidance: I was personally present during the use of fluoroscopy. "Tunnel Vision Technique" used to obtain the best possible view of the target area. Parallax error corrected before commencing the procedure. "Direction-depth-direction" technique used to introduce the needle under continuous pulsed fluoroscopy. Once target was reached, antero-posterior, oblique, and lateral fluoroscopic projection used confirm needle placement in all planes. Images permanently stored in EMR. Interpretation: I personally interpreted the imaging intraoperatively. Adequate needle placement confirmed in multiple planes. Appropriate spread of contrast into desired area was observed. No evidence of afferent or efferent intravascular uptake. No intrathecal or subarachnoid spread observed. Permanent images saved into the patient's record.  Antibiotic Prophylaxis:   Anti-infectives (From admission, onward)    None      Indication(s): None identified  Post-operative Assessment:  Post-procedure Vital Signs:  Pulse/HCG Rate: 7668 Temp: (!) 97 F (36.1 C) Resp: 18 BP: (!) 106/47 (a&o x3) SpO2: 100 %  EBL: None  Complications: No immediate post-treatment complications observed by team, or reported by patient.  Note: The patient tolerated the entire procedure well. A repeat set of vitals were taken after the procedure and the patient was kept under observation following institutional policy, for this type of procedure. Post-procedural neurological assessment was performed, showing return to baseline, prior to discharge. The patient was provided with post-procedure discharge instructions, including a section on how to identify  potential problems. Should any problems arise concerning this procedure, the patient was given instructions to immediately contact us, at any time, without hesitation. In any  case, we plan to contact the patient by telephone for a follow-up status report regarding this interventional procedure.  Comments:  No additional relevant information.  Plan of Care  Orders:  Orders Placed This Encounter  Procedures   Lumbar Epidural Injection    Scheduling Instructions:     Procedure: Interlaminar LESI L3-4     Laterality: Right     Sedation: Patient's choice     Timeframe: Today    Order Specific Question:   Where will this procedure be performed?    Answer:   [facility]   DG PA[facility] C-ARM 1-60 MIN NO REPORT    Intraoperative interpretation by procedural physician at [place].    Standing Status:   Standing    Number of Occurrences:   1    Order Specific Question:   Reason for exam:    Answer:   Assistance in needle guidance and placement for procedures requiring needle placement in or near specific anatomical locations not easily accessible without such assistance.   Informed Consent Details: Physician/Practitioner Attestation; Transcribe to consent form and obtain patient signature    Note: Always confirm laterality of pain with Mr. Flemmer, before procedure. Transcribe to consent form and obtain patient signature.    Order Specific Question:   Physician/Practitioner attestation of informed consent for procedure/surgical case    Answer:   I, the physician/practitioner, attest that I have discussed with the patient the benefits, risks, side effects, alternatives, likelihood of achieving goals and potential problems during recovery for the procedure that I have provided informed consent.    Order Specific Question:   Procedure    Answer:   Lumbar epidural steroid injection under fluoroscopic guidance    Order Specific Question:   Physician/Practitioner performing  the procedure    Answer:   Herta Hink A. Dossie Arbour, MD    Order Specific Question:   Indication/Reason    Answer:   Low back and/or lower extremity pain secondary to lumbar radiculitis   Provide equipment / supplies at bedside    "Epidural Tray" (Disposable  single use) Catheter: NOT required    Standing Status:   Standing    Number of Occurrences:   1    Order Specific Question:   Specify    Answer:   Epidural Tray   Chronic Opioid Analgesic:  Oxycodone/APAP 10/325 (# 60/month), 1 tab p.o. twice daily (last filled on 02/03/2021) Needs to loose an average of 2.75 lbs/mo to be considered for opioid pharmacotherapy.  MME/day: 30 mg/day   Medications ordered for procedure: Meds ordered this encounter  Medications   iohexol (OMNIPAQUE) 180 MG/ML injection 10 mL    Must be Myelogram-compatible. If not available, you may substitute with a water-soluble, non-ionic, hypoallergenic, myelogram-compatible radiological contrast medium.   lidocaine (XYLOCAINE) 2 % (with pres) injection 400 mg   pentafluoroprop-tetrafluoroeth (GEBAUERS) aerosol   lactated ringers infusion   midazolam (VERSED) 5 MG/5ML injection 0.5-2 mg    Make sure Flumazenil is available in the pyxis when using this medication. If oversedation occurs, administer 0.2 mg IV over 15 sec. If after 45 sec no response, administer 0.2 mg again over 1 min; may repeat at 1 min intervals; not to exceed 4 doses (1 mg)   sodium chloride flush (NS) 0.9 % injection 2 mL   ropivacaine (PF) 2 mg/mL (0.2%) (NAROPIN) injection 2 mL   triamcinolone acetonide (KENALOG-40) injection 40 mg   Medications administered: We administered iohexol, lidocaine, pentafluoroprop-tetrafluoroeth, lactated ringers, midazolam, sodium chloride flush,  ropivacaine (PF) 2 mg/mL (0.2%), and triamcinolone acetonide.  See the medical record for exact dosing, route, and time of administration.  Follow-up plan:   Return for Proc-day (T,Th), (F2F), (PPE).        Interventional Therapies  Risk  Complexity Considerations:   Estimated body mass index is 45.98 kg/m down from 50.97 kg/m as calculated from the following:   Height as of this encounter: 6' (1.829 m).   Weight as of this encounter: 339 lbs down from 375 lb 12.8 oz (170.5 kg). Our goal is to bring his BMI down to 30 kg/m (220 lbs for his height). He is supposed to lose 2.75 lbs/month. WNL   Planned  Pending:   Diagnostic/therapeutic right L3-4 LESI #1  Notes will be requested from the [place] and [place].   Under consideration:   Diagnostic/therapeutic left IA AC + glenohumeral joint inj. #1  Diagnostic/therapeutic left suprascapular NB #1  Diagnostic/therapeutic bilateral IA hip inj. #1  Diagnostic/therapeutic bilateral IA steroid knee inj. #1  Therapeutic bilateral IA viscosupplementation knee inj. #1  Diagnostic/therapeutic bilateral lumbar facet MBB #1    Completed:   Diagnostic/therapeutic right L4-5 LESI x1 (06/21/2021) by Dr. Gillis Santa (100/20/0/0)  Toradol/Norflex IM 60/60 mg (03/29/2021)  Referral to orthopedic surgery for evaluation of knees and hip (03/29/2021)  Referral to medical weight management (02/15/2021)  Referral to bariatric surgery (02/15/2021)    Completed by other providers:   [facility]  EMG/PNCV (LE) by Gurney Maxin, MD [facility] neurology) (03/16/2021) (chronic, severe sensorimotor polyneuropathy)   Therapeutic  Palliative (PRN) options:   None established     Recent Visits Date Type Provider Dept  10/20/21 Office Visit Milinda Pointer, MD [facility]  Showing recent visits within past 90 days and meeting all other requirements Today's Visits Date Type Provider Dept  11/04/21 Procedure visit Milinda Pointer, MD [facility]  Showing today's visits and meeting all other requirements Future Appointments Date Type Provider Dept  11/15/21 Appointment Milinda Pointer,  MD [facility]  11/30/21 Appointment Milinda Pointer, MD [facility]  Showing future appointments within next 90 days and meeting all other requirements  Disposition: Discharge home  Discharge (Date  Time): 11/04/2021; 1029 hrs.   Primary Care Physician: Olin Hauser, DO Location: [facility] Note by: Gaspar Cola, MD Date: 11/04/2021; Time: 10:50 AM  Disclaimer:  Medicine is not an Chief Strategy Officer. The only guarantee in medicine is that nothing is guaranteed. It is important to note that the decision to proceed with this intervention was based on the information collected from the patient. The Data and conclusions were drawn from the patient's questionnaire, the interview, and the physical examination. Because the information was provided in large part by the patient, it cannot be guaranteed that it has not been purposely or unconsciously manipulated. Every effort has been made to obtain as much relevant data as possible for this evaluation. It is important to note that the conclusions that lead to this procedure are derived in large part from the available data. Always take into account that the treatment will also be dependent on availability of resources and existing treatment guidelines, considered by other Pain Management Practitioners as being common knowledge and practice, at the time of the intervention. For Medico-Legal purposes, it is also important to point out that variation in procedural techniques and pharmacological choices are the acceptable norm. The indications, contraindications, technique, and results of the above  procedure should only be interpreted and judged by a Board-Certified Interventional Pain Specialist with extensive familiarity and expertise in the same exact procedure and technique.

## 2021-11-04 NOTE — Progress Notes (Signed)
Safety precautions to be maintained throughout the outpatient stay will include: orient to surroundings, keep bed in low position, maintain call bell within reach at all times, provide assistance with transfer out of bed and ambulation.  

## 2021-11-05 ENCOUNTER — Telehealth: Payer: Self-pay

## 2021-11-05 NOTE — Telephone Encounter (Signed)
Post procedure phone call.  Patient states he is doing good.  

## 2021-11-11 ENCOUNTER — Telehealth: Payer: Self-pay | Admitting: Podiatry

## 2021-11-11 NOTE — Telephone Encounter (Signed)
Caller checking completion status og forms faxed on 10-3  Caller requesting a cb w/ a status update

## 2021-11-11 NOTE — Telephone Encounter (Signed)
I am unsure on status of the form being referenced.  I am currently working on faxes from this week 10/24 to 10/26  If the form was faxed on 10/3 it has been completed weeks ago.  If they did not receive it, I would suggest that they re-fax it back to [place].  Nobie Putnam, [facility] 11/11/2021, 5:39 PM

## 2021-11-11 NOTE — Progress Notes (Signed)
PROVIDER NOTE: Information contained herein reflects review and annotations entered in association with encounter. Interpretation of such information and data should be left to medically-trained personnel. Information provided to patient can be located elsewhere in the medical record under "Patient Instructions". Document created using STT-dictation technology, any transcriptional errors that may result from process are unintentional.    Patient: James [name]  Service Category: E/M  Provider: Gaspar Cola, MD  DOB: [birth year]  DOS: 11/15/2021  Referring Provider: Nobie Putnam *  MRN: [id number]  Specialty: Interventional Pain Management  PCP: Olin Hauser, DO  Type: Established Patient  Setting: Ambulatory outpatient    Location: Office  Delivery: Face-to-face     HPI  [name], a 61 y.o. year old male, is here today because of his Chronic pain syndrome [G89.4]. Mr. Beigel primary complain today is Leg Pain (Right hurts most) Last encounter: My last encounter with him was on 11/04/2021. Pertinent problems: Mr. Willcox has Chronic pain syndrome; DDD (degenerative disc disease), lumbar; Primary osteoarthritis involving multiple joints; Weakness of lower extremities (Bilateral); Chronic lower extremity pain (1ry area of Pain) (Bilateral); Chronic low back pain (4th area of Pain) (Bilateral) w/ sciatica (Bilateral); Chronic shoulder pain (5th area of Pain) (Left); Chronic hip pain (2ry area of Pain) (Bilateral); Chronic knee pain (3ry area of Pain) (Bilateral); Osteoarthritis of hips (Bilateral); Osteoarthritis of hip (Left); Osteoarthritis of hip (Right); Osteoarthritis of knee (Left); Lumbosacral radiculopathy at L5 (Left); Diabetic neuropathy ([place]); Osteoarthritis of AC (acromioclavicular) joint (Left); Osteoarthritis of shoulder (Left); Rotator cuff arthropathy of shoulder (Left); Lumbosacral facet hypertrophy (L4-5, L5-S1); Lumbar facet syndrome;  Abnormal MRI, hip (04/21/2021); 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); Abnormal EMG (electromyogram) (03/16/2021); Polyneuropathy, peripheral sensorimotor axonal; 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 Pain Assessment: Severity of Chronic pain is reported as a 7 /10. Location: Leg Right/radiates down front and sides. Onset: More than a month ago. Quality: Aching, Burning, Stabbing, Constant. Timing: Constant. Modifying factor(s): nothing. Vitals:  height is 6' (1.829 m) and weight is 329 lb (149.2 kg) (abnormal). His temporal temperature is 97.2 F (36.2 C) (abnormal). His blood pressure is 124/86 and his pulse is 80. His oxygen saturation is 95%.   Reason for encounter: both, medication management and post-procedure evaluation and assessment.  The patient indicates doing well with the current medication regimen. No adverse reactions or side effects reported to the medications.   RTCB: 02/23/2022  Post-procedure evaluation   Type: Lumbar epidural steroid injection (LESI) (interlaminar) #1    Laterality: Right   Level:  L3-4 Level.  Imaging: Fluoroscopic guidance         Anesthesia: Local anesthesia (1-2% Lidocaine) Anxiolysis: None                 Sedation:                         DOS: 11/04/2021  Performed by: Gaspar Cola, MD  Purpose: Diagnostic/Therapeutic Indications: Lumbar radicular pain of intraspinal etiology of more than 4 weeks that has failed to respond to conservative therapy and is severe  enough to impact quality of life or function. 1. DDD (  degenerative disc disease), lumbar   2. Herniated nucleus pulposus, L3-4 (Right)   3. Lumbar lateral recess stenosis (Right: L3-4) (Bilateral: L4-5)   4. 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 Lumbar nerve root impingement (Right: L4)   6. 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 Chronic lower extremity pain (1ry area of Pain) (Bilateral)   9. Weakness of lower extremities (Bilateral)   10. Lumbar facet arthropathy (Multilevel) (L1-2 through L5-S1) (Bilateral)   11. Abnormal MRI, lumbar spine (05/21/2021)   12. Morbid obesity with BMI of 45.0-49.9, adult ([place])    NAS-11 Pain score:   Pre-procedure:  10/10   Post-procedure:  0/10      Effectiveness:  Initial hour after procedure: 100 %. Subsequent 4-6 hours post-procedure: 100 %. Analgesia past initial 6 hours: 25 % improvement in the leg pain and 100% for the low back pain. Ongoing improvement:  Analgesic: The patient indicated having attained an ongoing 25% improvement of his lower extremity pain.  However, he also indicates having an ongoing 100% relief of his low back pain. Function: Mr. Tosi reports improvement in function.  He refers now being able to stand up without getting excruciating pain. ROM: Mr. Dugger reports improvement in ROM.   Pharmacotherapy Assessment  Analgesic: Oxycodone/APAP 10/325 (# 60/month), 1 tab p.o. twice daily (last filled on 02/03/2021) Needs to loose an average of 2.75 lbs/mo to be considered for opioid pharmacotherapy.  MME/day: 30 mg/day   Monitoring: [place] PMP: PDMP reviewed during this encounter.       Pharmacotherapy: No side-effects or adverse reactions reported. Compliance: No problems identified. Effectiveness: Clinically acceptable.  Arlice Colt, RN  11/15/2021  9:50 AM  Sign when Signing Visit Nursing Pain Medication  Assessment:  Safety precautions to be maintained throughout the outpatient stay will include: orient to surroundings, keep bed in low position, maintain call bell within reach at all times, provide assistance with transfer out of bed and ambulation.  Medication Inspection Compliance: Pill count conducted under aseptic conditions, in front of the patient. Neither the pills nor the bottle was removed from the patient's sight at any time. Once count was completed pills were immediately returned to the patient in their original bottle.  Medication: Oxycodone IR Pill/Patch Count:  29 of 60 pills remain Pill/Patch Appearance: Markings consistent with prescribed medication Bottle Appearance: Standard pharmacy container. Clearly labeled. Filled Date: 65 / 17 / 2023 Last Medication intake:  Today    No results found for: "CBDTHCR" No results found for: "D8THCCBX" No results found for: "D9THCCBX"  UDS:  Summary  Date Value Ref Range Status  02/15/2021 Note  Final    Comment:    ==================================================================== Compliance Drug Analysis, Ur ==================================================================== Test                             Result       Flag       Units  Drug Present and Declared for Prescription Verification   Oxycodone                      3020         EXPECTED   ng/mg creat   Oxymorphone                    1094         EXPECTED  ng/mg creat   Noroxycodone                   3969         EXPECTED   ng/mg creat   Noroxymorphone                 265          EXPECTED   ng/mg creat    Sources of oxycodone are scheduled prescription medications.    Oxymorphone, noroxycodone, and noroxymorphone are expected    metabolites of oxycodone. Oxymorphone is also available as a    scheduled prescription medication.    Pregabalin                     PRESENT      EXPECTED   Acetaminophen                  PRESENT       EXPECTED ==================================================================== Test                      Result    Flag   Units      Ref Range   Creatinine              172              mg/dL      >=20 ==================================================================== Declared Medications:  The flagging and interpretation on this report are based on the  following declared medications.  Unexpected results may arise from  inaccuracies in the declared medications.   **Note: The testing scope of this panel includes these medications:   Oxycodone (Percocet)  Pregabalin (Lyrica)   **Note: The testing scope of this panel does not include small to  moderate amounts of these reported medications:   Acetaminophen (Percocet)   **Note: The testing scope of this panel does not include the  following reported medications:   Atorvastatin (Lipitor)  Hydrochlorothiazide (Hydrodiuril)  Insulin Tyler Aas)  Losartan (Cozaar)  Metformin (Glucophage)  Semaglutide (Ozempic) ==================================================================== For clinical consultation, please call [phone]. ====================================================================       ROS  Constitutional: Denies any fever or chills Gastrointestinal: No reported hemesis, hematochezia, vomiting, or acute GI distress Musculoskeletal: Denies any acute onset joint swelling, redness, loss of ROM, or weakness Neurological: No reported episodes of acute onset apraxia, aphasia, dysarthria, agnosia, amnesia, paralysis, loss of coordination, or loss of consciousness  Medication Review  Oxycodone HCl, Semaglutide (1 MG/DOSE), albuterol, atorvastatin, hydrochlorothiazide, losartan, metFORMIN, naloxone, and pregabalin  History Review  Allergy: Mr. Seefeldt has No Known Allergies. Drug: Mr. Pennisi  reports no history of drug use. Alcohol:  reports no history of alcohol use. Tobacco:  reports that he has been  smoking cigarettes. He has a 2.50 pack-year smoking history. He has never used smokeless tobacco. Social: Mr. Kowalke  reports that he has been smoking cigarettes. He has a 2.50 pack-year smoking history. He has never used smokeless tobacco. He reports that he does not drink alcohol and does not use drugs. Medical:  has a past medical history of Arthritis, Diabetes mellitus without complication ([place]), Hyperlipidemia, and Hypertension. Surgical: Mr. Rawl  has a past surgical history that includes Colonoscopy with propofol; Appendectomy; and Colonoscopy with propofol (N/A, 04/23/2021). Family: family history includes Arthritis in his father and mother; Heart disease in his mother.  Laboratory Chemistry Profile   Renal Lab Results  Component Value Date   BUN 12 06/25/2021   CREATININE 0.67 (  L) 06/25/2021   BCR 18 06/25/2021   GFRNONAA >60 10/30/2020    Hepatic Lab Results  Component Value Date   AST 10 06/25/2021   ALT 8 (L) 06/25/2021   ALBUMIN 4.1 02/15/2021   ALKPHOS 116 02/15/2021    Electrolytes Lab Results  Component Value Date   NA 142 06/25/2021   K 4.1 06/25/2021   CL 100 06/25/2021   CALCIUM 9.0 06/25/2021   MG 1.8 02/15/2021    Bone Lab Results  Component Value Date   25OHVITD1 13 (L) 02/15/2021   25OHVITD2 <1.0 02/15/2021   25OHVITD3 12 02/15/2021    Inflammation (CRP: Acute Phase) (ESR: Chronic Phase) Lab Results  Component Value Date   CRP 29 (H) 02/15/2021   ESRSEDRATE 39 (H) 02/15/2021         Note: Above Lab results reviewed.  Recent Imaging Review  DG PAIN CLINIC C-ARM 1-60 MIN NO REPORT Fluoro was used, but no Radiologist interpretation will be provided.  Please refer to "NOTES" tab for provider progress note. Note: Reviewed        Physical Exam  General appearance: Well nourished, well developed, and well hydrated. In no apparent acute distress Mental status: Alert, oriented x 3 (person, place, & time)       Respiratory: No evidence of  acute respiratory distress Eyes: PERLA Vitals: BP 124/86 (BP Location: Right Arm, Patient Position: Sitting, Cuff Size: Large)   Pulse 80   Temp (!) 97.2 F (36.2 C) (Temporal)   Ht 6' (1.829 m)   Wt (!) 329 lb (149.2 kg)   SpO2 95%   BMI 44.62 kg/m  BMI: Estimated body mass index is 44.62 kg/m as calculated from the following:   Height as of this encounter: 6' (1.829 m).   Weight as of this encounter: 329 lb (149.2 kg). Ideal: Ideal body weight: 77.6 kg (171 lb 1.2 oz) Adjusted ideal body weight: 106.3 kg (234 lb 3.9 oz)  Assessment   Diagnosis Status  1. Chronic pain syndrome   2. Chronic lower extremity pain (1ry area of Pain) (Bilateral)   3. Chronic hip pain (2ry area of Pain) (Bilateral)   4. Chronic knee pain (3ry area of Pain) (Bilateral)   5. Chronic low back pain (4th area of Pain) (Bilateral) w/ sciatica (Bilateral)   6. Chronic shoulder pain (5th area of Pain) (Left)   7. Chronic low back pain (2ry area of Pain) (Bilateral) w/ sciatica (Bilateral)   8. Chronic shoulder pain (3ry area of Pain) (Left)   9. Primary osteoarthritis of hips (Bilateral)   10. Osteoarthritis of hip (Left)   11. Osteoarthritis of hip (Right)   12. Osteoarthritis of knee (Left)   13. Pharmacologic therapy   14. Chronic use of opiate for therapeutic purpose   15. Encounter for medication management   16. Encounter for chronic pain management    Controlled Controlled Controlled   Updated Problems: No problems updated.   Plan of Care  Problem-specific:  No problem-specific Assessment & Plan notes found for this encounter.  Mr. Asaph Serena has a current medication list which includes the following long-term medication(s): albuterol, atorvastatin, hydrochlorothiazide, losartan, metformin, pregabalin, [START ON 11/25/2021] oxycodone hcl, [START ON 12/25/2021] oxycodone hcl, and [START ON 01/24/2022] oxycodone hcl.  Pharmacotherapy (Medications Ordered): Meds ordered this encounter   Medications   Oxycodone HCl 10 MG TABS    Sig: Take 1 tablet (10 mg total) by mouth in the morning and at bedtime. Must last 30 days.  Dispense:  60 tablet    Refill:  0    DO NOT: delete (not duplicate); no partial-fill (will deny script to complete), no refill request (F/U required). DISPENSE: 1 day early if closed on fill date. WARN: No CNS-depressants within 8 hrs of med.   Oxycodone HCl 10 MG TABS    Sig: Take 1 tablet (10 mg total) by mouth in the morning and at bedtime. Must last 30 days.    Dispense:  60 tablet    Refill:  0    DO NOT: delete (not duplicate); no partial-fill (will deny script to complete), no refill request (F/U required). DISPENSE: 1 day early if closed on fill date. WARN: No CNS-depressants within 8 hrs of med.   Oxycodone HCl 10 MG TABS    Sig: Take 1 tablet (10 mg total) by mouth in the morning and at bedtime. Must last 30 days.    Dispense:  60 tablet    Refill:  0    DO NOT: delete (not duplicate); no partial-fill (will deny script to complete), no refill request (F/U required). DISPENSE: 1 day early if closed on fill date. WARN: No CNS-depressants within 8 hrs of med.   naloxone (NARCAN) nasal spray 4 mg/0.1 mL    Sig: Place 1 spray into the nose as needed for up to 365 doses (for opioid-induced respiratory depresssion). In case of emergency (overdose), spray once into each nostril. If no response within 3 minutes, repeat application and call [phone].    Dispense:  1 each    Refill:  0    Instruct patient in proper use of device.   Orders:  Orders Placed This Encounter  Procedures   Lumbar Epidural Injection    Standing Status:   Future    Standing Expiration Date:   02/15/2022    Scheduling Instructions:     Procedure: Interlaminar Lumbar Epidural Steroid injection (LESI)  L4-5     Laterality: Right-sided     Sedation: Patient's choice.     Timeframe: ASAA    Order Specific Question:   Where will this procedure be performed?    Answer:   [facility]   Follow-up plan:   Return for procedure (Clinic): (R) L4-5 LESI #2.     Interventional Therapies  Risk  Complexity Considerations:   Estimated body mass index is 45.98 kg/m down from 50.97 kg/m as calculated from the following:   Height as of this encounter: 6' (1.829 m).   Weight as of this encounter: 339 lbs down from 375 lb 12.8 oz (170.5 kg). Our goal is to bring his BMI down to 30 kg/m (220 lbs for his height). He is supposed to lose 2.75 lbs/month. WNL   Planned  Pending:   Diagnostic/therapeutic right L4-5 LESI #2   Notes will be requested from the [place].   Under consideration:   Diagnostic/therapeutic left IA AC + glenohumeral joint inj. #1  Diagnostic/therapeutic left suprascapular NB #1  Diagnostic/therapeutic bilateral IA hip inj. #1  Diagnostic/therapeutic bilateral IA steroid knee inj. #1  Therapeutic bilateral IA viscosupplementation knee inj. #1  Diagnostic/therapeutic bilateral lumbar facet MBB #1    Completed:   Diagnostic/therapeutic right L3-4 LESI x1 (11/04/21) (100/100/25/25) (100% of LBP)  Diagnostic/therapeutic right L4-5 LESI x1 (06/21/2021) by Dr. Gillis Santa (100/20/0/0)  Toradol/Norflex IM 60/60 mg (03/29/2021)  Referral to orthopedic surgery for evaluation of knees and hip (03/29/2021)  Referral to medical weight management (02/15/2021)  Referral to bariatric  surgery (02/15/2021)    Completed by other providers:   [facility]  EMG/PNCV (LE) by Gurney Maxin, MD [facility] neurology) (03/16/2021) (chronic, severe sensorimotor polyneuropathy)   Therapeutic  Palliative (PRN) options:   None established      Recent Visits Date Type Provider Dept  11/04/21 Procedure visit Milinda Pointer, MD [facility]  10/20/21 Office Visit Milinda Pointer, MD [facility]  Showing recent visits within past 90 days and meeting all other requirements Today's  Visits Date Type Provider Dept  11/15/21 Office Visit Milinda Pointer, MD [facility]  Showing today's visits and meeting all other requirements Future Appointments Date Type Provider Dept  11/30/21 Appointment Milinda Pointer, MD [facility]  Showing future appointments within next 90 days and meeting all other requirements  I discussed the assessment and treatment plan with the patient. The patient was provided an opportunity to ask questions and all were answered. The patient agreed with the plan and demonstrated an understanding of the instructions.  Patient advised to call back or seek an in-person evaluation if the symptoms or condition worsens.  Duration of encounter: 30 minutes.  Total time on encounter, as per AMA guidelines included both the face-to-face and non-face-to-face time personally spent by the physician and/or other qualified health care professional(s) on the day of the encounter (includes time in activities that require the physician or other qualified health care professional and does not include time in activities normally performed by clinical staff). Physician's time may include the following activities when performed: preparing to see the patient (eg, review of tests, pre-charting review of records) obtaining and/or reviewing separately obtained history performing a medically appropriate examination and/or evaluation counseling and educating the patient/family/caregiver ordering medications, tests, or procedures referring and communicating with other health care professionals (when not separately reported) documenting clinical information in the electronic or other health record independently interpreting results (not separately reported) and communicating results to the patient/ family/caregiver care coordination (not separately reported)  Note by: Gaspar Cola, MD Date: 11/15/2021; Time: 10:15 AM

## 2021-11-11 NOTE — Patient Instructions (Addendum)
____________________________________________________________________________________________  Patient Information update  To: All of our patients.  Re: Name change.  It has been made official that our current name, "[facility]"   will soon be changed to "[facility]   The purpose of this change is to eliminate any confusion created by the concept of our practice being a "Medication Management Pain Clinic". In the past this has led to the misconception that we treat pain primarily by the use of prescription medications.  Nothing can be farther from the truth.   Understanding PAIN MANAGEMENT: To further understand what our practice does, you first have to understand that "Pain Management" is a subspecialty that requires additional training once a physician has completed their specialty training, which can be in either Anesthesia, Neurology, Psychiatry, or Physical Medicine and Rehabilitation (PMR). Each one of these contributes to the final approach taken by each physician to the management of their patient's pain. To be a "Pain Management Specialist" you must have first completed one of the specialty trainings below.  Anesthesiologists - trained in clinical pharmacology and interventional techniques such as nerve blockade and regional as well as central neuroanatomy. They are trained to block pain before, during, and after surgical interventions.  Neurologists - trained in the diagnosis and pharmacological treatment of complex neurological conditions, such as Multiple Sclerosis, Parkinson's, spinal cord injuries, and other systemic conditions that may be associated with symptoms that may include but are not limited to pain. They tend to rely primarily on the treatment of chronic pain using prescription medications.  Psychiatrist - trained in conditions affecting the psychosocial wellbeing  of patients including but not limited to depression, anxiety, schizophrenia, personality disorders, addiction, and other substance use disorders that may be associated with chronic pain. They tend to rely primarily on the treatment of chronic pain using prescription medications.   Physical Medicine and Rehabilitation (PMR) physicians, also known as physiatrists - trained to treat a wide variety of medical conditions affecting the brain, spinal cord, nerves, bones, joints, ligaments, muscles, and tendons. Their training is primarily aimed at treating patients that have suffered injuries that have caused severe physical impairment. Their training is primarily aimed at the physical therapy and rehabilitation of those patients. They may also work alongside orthopedic surgeons or neurosurgeons using their expertise in assisting surgical patients to recover after their surgeries.  INTERVENTIONAL PAIN MANAGEMENT is sub-subspecialty of Pain Management.  Our physicians are Board-certified in Anesthesia, Pain Management, and Interventional Pain Management.  This meaning that not only have they been trained and Board-certified in their specialty of Anesthesia, and subspecialty of Pain Management, but they have also received further training in the sub-subspecialty of Interventional Pain Management, in order to become Board-certified as INTERVENTIONAL PAIN MANAGEMENT SPECIALIST.    Mission: Our goal is to use our skills in  INTERVENTIONAL PAIN MANAGEMENT as alternatives to the chronic use of prescription opioid medications for the treatment of pain. To make this more clear, we have changed our name to reflect what we do and offer. We will continue to offer medication management assessment and recommendations, but we will not be taking over any patient's medication management.  ____________________________________________________________________________________________   _______________________________________________________________________  Medication Rules  Purpose: To inform patients, and their family members, of our medication rules and regulations.  Applies to: All patients receiving prescriptions from our practice (written or electronic).  Pharmacy of record: This is the pharmacy where your electronic prescriptions will be sent.   Make sure we have the correct one.  Electronic prescriptions: In compliance with the [place] Strengthen Opioid Misuse Prevention (STOP) Act of 2017 (Session Law 2017-74/H243), effective January 17, 2018, all controlled substances must be electronically prescribed. Written prescriptions, faxing, or calling prescriptions to a pharmacy will no longer be done.  Prescription refills: These will be provided only during in-person appointments. No medications will be renewed without a "face-to-face" evaluation with your provider. Applies to all prescriptions.  NOTE: The following applies primarily to controlled substances (Opioid* Pain Medications).   Type of encounter (visit): For patients receiving controlled substances, face-to-face visits are required. (Not an option and not up to the patient.)  Patient's responsibilities: Pain Pills: Bring all pain pills to every appointment (except for procedure appointments). Pill Bottles: Bring pills in original pharmacy bottle. Bring bottle, even if empty. Always bring the bottle of the most recent fill.  Medication refills: You are responsible for knowing and keeping track of what medications you are taking and when is it that you will need a refill. The day before your appointment: write a list of all prescriptions that need to be refilled. The day of the appointment: give the list to the admitting nurse. Prescriptions will be written only during appointments. No prescriptions will be written on procedure days. If you forget a medication: it will not be "Called in", "Faxed", or  "electronically sent". You will need to get another appointment to get these prescribed. No early refills. Do not call asking to have your prescription filled early. Partial  or short prescriptions: Occasionally your pharmacy may not have enough pills to fill your prescription.  NEVER ACCEPT a partial fill or a prescription that is short of the total amount of pills that you were prescribed.  With controlled substances the law allows 72 hours for the pharmacy to complete the prescription.  If the prescription is not completed within 72 hours, the pharmacist will require a new prescription to be written. This means that you will be short on your medicine and we WILL NOT send another prescription to complete your original prescription.  Instead, request the pharmacy to send a carrier to a nearby branch to get enough medication to provide you with your full prescription. Prescription Accuracy: You are responsible for carefully inspecting your prescriptions before leaving our office. Have the discharge nurse carefully go over each prescription with you, before taking them home. Make sure that your name is accurately spelled, that your address is correct. Check the name and dose of your medication to make sure it is accurate. Check the number of pills, and the written instructions to make sure they are clear and accurate. Make sure that you are given enough medication to last until your next medication refill appointment. Taking Medication: Take medication as prescribed. When it comes to controlled substances, taking less pills or less frequently than prescribed is permitted and encouraged. Never take more pills than instructed. Never take the medication more frequently than prescribed.  Inform other Doctors: Always inform, all of your healthcare providers, of all the medications you take. Pain Medication from other Providers: You are not allowed to accept any additional pain medication from any other Doctor or  Healthcare provider. There are two exceptions to this rule. (see below) In the event that you require additional pain medication, you are responsible for notifying us, as stated below. Cough Medicine: Often these contain an opioid, such as codeine or hydrocodone. Never accept or take cough medicine containing these opioids if   you are already taking an opioid* medication. The combination may cause respiratory failure and death. Medication Agreement: You are responsible for carefully reading and following our Medication Agreement. This must be signed before receiving any prescriptions from our practice. Safely store a copy of your signed Agreement. Violations to the Agreement will result in no further prescriptions. (Additional copies of our Medication Agreement are available upon request.) Laws, Rules, & Regulations: All patients are expected to follow all Federal and State Laws, Statutes, Rules, & Regulations. Ignorance of the Laws does not constitute a valid excuse.  Illegal drugs and Controlled Substances: The use of illegal substances (including, but not limited to marijuana and its derivatives) and/or the illegal use of any controlled substances is strictly prohibited. Violation of this rule may result in the immediate and permanent discontinuation of any and all prescriptions being written by our practice. The use of any illegal substances is prohibited. Adopted CDC guidelines & recommendations: Target dosing levels will be at or below 60 MME/day. Use of benzodiazepines** is not recommended.  Exceptions: There are only two exceptions to the rule of not receiving pain medications from other Healthcare Providers. Exception #1 (Emergencies): In the event of an emergency (i.e.: accident requiring emergency care), you are allowed to receive additional pain medication. However, you are responsible for: As soon as you are able, call our office [phone], at any time of the day or night, and leave a  message stating your name, the date and nature of the emergency, and the name and dose of the medication prescribed. In the event that your call is answered by a member of our staff, make sure to document and save the date, time, and the name of the person that took your information.  Exception #2 (Planned Surgery): In the event that you are scheduled by another doctor or dentist to have any type of surgery or procedure, you are allowed (for a period no longer than 30 days), to receive additional pain medication, for the acute post-op pain. However, in this case, you are responsible for picking up a copy of our "Post-op Pain Management for Surgeons" handout, and giving it to your surgeon or dentist. This document is available at our office, and does not require an appointment to obtain it. Simply go to our office during business hours (Monday-Thursday from 8:00 AM to 4:00 PM) (Friday 8:00 AM to 12:00 Noon) or if you have a scheduled appointment with us, prior to your surgery, and ask for it by name. In addition, you are responsible for: calling our office [phone], at any time of the day or night, and leaving a message stating your name, name of your surgeon, type of surgery, and date of procedure or surgery. Failure to comply with your responsibilities may result in termination of therapy involving the controlled substances. Medication Agreement Violation. Following the above rules, including your responsibilities will help you in avoiding a Medication Agreement Violation ("Breaking your Pain Medication Contract").  Consequences:  Not following the above rules may result in permanent discontinuation of medication prescription therapy.  *Opioid medications include: morphine, codeine, oxycodone, oxymorphone, hydrocodone, hydromorphone, meperidine, tramadol, tapentadol, buprenorphine, fentanyl, methadone. **Benzodiazepine medications include: diazepam (Valium), alprazolam (Xanax), clonazepam (Klonopine),  lorazepam (Ativan), clorazepate (Tranxene), chlordiazepoxide (Librium), estazolam (Prosom), oxazepam (Serax), temazepam (Restoril), triazolam (Halcion) (Last updated: 11/09/2021) ______________________________________________________________________   ______________________________________________________________________  Medication Recommendations and Reminders  Applies to: All patients receiving prescriptions (written and/or electronic).  Medication Rules & Regulations: You are responsible for reading, knowing, and   following our "Medication Rules" document. These exist for your safety and that of others. They are not flexible and neither are we. Dismissing or ignoring them is an act of "non-compliance" that may result in complete and irreversible termination of such medication therapy. For safety reasons, "non-compliance" will not be tolerated. As with the [place] fundamental legal principle of "ignorance of the law is no defense", we will accept no excuses for not having read and knowing the content of documents provided to you by our practice.  Pharmacy of record:  Definition: This is the pharmacy where your electronic prescriptions will be sent.  We do not endorse any particular pharmacy. It is up to you and your insurance to decide what pharmacy to use.  We do not restrict you in your choice of pharmacy. However, once we write for your prescriptions, we will NOT be re-sending more prescriptions to fix restricted supply problems created by your pharmacy, or your insurance.  The pharmacy listed in the electronic medical record should be the one where you want electronic prescriptions to be sent. If you choose to change pharmacy, simply notify our nursing staff. Changes will be made only during your regular appointments and not over the phone.  Recommendations: Keep all of your pain medications in a safe place, under lock and key, even if you live alone. We will NOT replace lost, stolen, or  damaged medication. We do not accept "Police Reports" as proof of medications having been stolen. After you fill your prescription, take 1 week's worth of pills and put them away in a safe place. You should keep a separate, properly labeled bottle for this purpose. The remainder should be kept in the original bottle. Use this as your primary supply, until it runs out. Once it's gone, then you know that you have 1 week's worth of medicine, and it is time to come in for a prescription refill. If you do this correctly, it is unlikely that you will ever run out of medicine. To make sure that the above recommendation works, it is very important that you make sure your medication refill appointments are scheduled at least 1 week before you run out of medicine. To do this in an effective manner, make sure that you do not leave the office without scheduling your next medication management appointment. Always ask the nursing staff to show you in your prescription , when your medication will be running out. Then arrange for the receptionist to get you a return appointment, at least 7 days before you run out of medicine. Do not wait until you have 1 or 2 pills left, to come in. This is very poor planning and does not take into consideration that we may need to cancel appointments due to bad weather, sickness, or emergencies affecting our staff. DO NOT ACCEPT A "Partial Fill": If for any reason your pharmacy does not have enough pills/tablets to completely fill or refill your prescription, do not allow for a "partial fill". The law allows the pharmacy to complete that prescription within 72 hours, without requiring a new prescription. If they do not fill the rest of your prescription within those 72 hours, you will need a separate prescription to fill the remaining amount, which we will NOT provide. If the reason for the partial fill is your insurance, you will need to talk to the pharmacist about payment alternatives for  the remaining tablets, but again, DO NOT ACCEPT A PARTIAL FILL, unless you can trust your pharmacist to obtain   the remainder of the pills within 72 hours.  Prescription refills and/or changes in medication(s):  Prescription refills, and/or changes in dose or medication, will be conducted only during scheduled medication management appointments. (Applies to both, written and electronic prescriptions.) No refills on procedure days. No medication will be changed or started on procedure days. No changes, adjustments, and/or refills will be conducted on a procedure day. Doing so will interfere with the diagnostic portion of the procedure. No phone refills. No medications will be "called into the pharmacy". No Fax refills. No weekend refills. No Holliday refills. No after hours refills.  Remember:  Business hours are:  Monday to Thursday 8:00 AM to 4:00 PM Provider's Schedule: Francisco Naveira, MD - Appointments are:  Medication management: Monday and Wednesday 8:00 AM to 4:00 PM Procedure day: Tuesday and Thursday 7:30 AM to 4:00 PM Bilal Lateef, MD - Appointments are:  Medication management: Tuesday and Thursday 8:00 AM to 4:00 PM Procedure day: Monday and Wednesday 7:30 AM to 4:00 PM (Last update: 11/09/2021) ______________________________________________________________________  ____________________________________________________________________________________________  Pharmacy Shortages of Pain Medication   Introduction Shockingly as it may seem, .  "No U.S[place] Supreme Court decision has ever interpreted the Constitution as guaranteeing a right to health care for all Americans." - https://www.healthequityandpolicylab.com/elusive-right-to-health-care-under-us-law  "With respect to human rights, the [place] has no formally codified right to health, nor does it participate in a human rights treaty that specifies a right to health." - Scott J. Schweikart, JD, MBE  Situation By  now, most of our patients have had the experience of being told by their pharmacist that they do not have enough medication to cover their prescription. If you have not had this experience, just know that you soon will.  Problem There appears to be a shortage of these medications, either at the national level or locally. This is happening with all pharmacies. When there is not enough medication, patients are offered a partial fill and they are told that they will try to get the rest of the medicine for them at a later time. If they do not have enough for even a partial fill, the pharmacists are telling the patients to call us (the prescribing physicians) to request that we send another prescription to another pharmacy to get the medicine.   This reordering of a controlled substance creates documentation problems where additional paperwork needs to be created to explain why two prescriptions for the same period of time and the same medicine are being prescribed to the same patient. It also creates situations where the last appointment note does not accurately reflect when and what prescriptions were given to a patient. This leads to prescribing errors down the line, in subsequent follow-up visits.   [place] Board of Pharmacy ([place]BOP) Research revealed that Board of Pharmacy Rule .1806 (21 NCAC 46.1806) authorizes pharmacists to the transfer of prescriptions among pharmacies, and it sets forth procedural and recordkeeping requirements for doing so. However, this requires the pharmacist to complete the previously mentioned procedural paperwork to accomplish the transfer. As it turns out, it is much easier for them to have the prescribing physicians do the work.   Possible solutions 1. You can ask your physician to assist you in weaning yourself off these medications. 2. Ask your pharmacy if the medication is in stock, 3 days prior to your refill. 3. If you need a pharmacy change, let us know at your  medication management visit. Prescriptions that have already been electronically sent to a pharmacy will   not be re-sent to a different pharmacy if your pharmacy of record does not have it in stock. Proper stocking of medication is a pharmacy problem, not a prescriber problem. Work with your pharmacist to solve the problem. 4. Have the [place] State Assembly add a provision to the "STOP ACT" (the law that mandates how controlled substances are prescribed) where there is an exception to the electronic prescribing rule that states that in the event there are shortages of medications the physicians are allowed to use written prescriptions as opposed to electronic ones. This would allow patients to take their prescriptions to a different pharmacy that may have enough medication available to fill the prescription. The problem is that currently there is a law that does not allow for written prescriptions, with the exception of instances where the electronic medical record is down due to technical issues.  5. Have [place] Congress ease the pressure on pharmaceutical companies, allowing them to produce enough quantities of the medication to adequately supply the population. 6. Have pharmacies keep enough stocks of these medications to cover their client base.  7. Have the [place] State Assembly add a provision to the "STOP ACT" where they ease the regulations surrounding the transfer of controlled substances between pharmacies, so as to simplify the transfer of supplies. As an alternative, develop a system to allow patients to obtain the remainder of their prescription at another one of their pharmacies or at an associate pharmacy.   How this shortage will affect you.  Understand that this is a pharmacy supply problem, not a prescriber problem. Work with your pharmacy to solve it. The job of the prescriber is to evaluate and monitor the patient for the appropriate indications and use of these medicines. It is  not the job of the prescriber to supply the medication or to solve problems with that supply. The responsibility and the choice to obtain the medication resides on the patient. By law, supplying the medication is the job of the pharmacy. It is certainly not the job of the prescriber to solve supply problems.   Due to the above problems we are no longer taking patients to write for their pain medication. Future discussions with your physician may include potentially weaning medications or transitioning to alternatives.  We will be focusing primarily on interventional based pain management. We will continue to evaluate for appropriate indications and we may provide recommendations regarding medication, dose, and schedule, as well as monitoring recommendations, however, we will not be taking over the actual prescribing of these substances. On those patients where we are treating their chronic pain with interventional therapies, exceptions will be considered on a case by case basis. At this time, we will try to continue providing this supplemental service to those patients we have been managing in the past. However, as of August 1st, 2023, we no longer will be sending additional prescriptions to other pharmacies for the purpose of solving their supply problems. Once we send a prescription to a pharmacy, we will not be resending it again to another pharmacy to cover for their shortages.   What to do. Write as many letters as you can. Recruit the help of family members in writing these letters. Below are some of the places where you can write to make your voice heard. Let them know what the problem is and push them to look for solutions.   Search internet for: "[place] find your legislators" https://www.ncleg.gov/findyourlegislators  Search internet for: "[place] insurance commissioner   complaints" https://www.ncdoi.gov/contactscomplaints/assistance-or-file-complaint  Search internet for: "[place] Board of Pharmacy complaints" http://www.ncbop.org/contact.htm  Search internet for: "[facility] pharmacy complaints" Email [facility] Customer Relations https://www.[facility].com/help/email-[email or web address].jsp?callType=store  Search internet for: "[facility] pharmacy customer service complaints" https://www.[facility].com/topic/marketing/contactus/contactus_customerservice.jsp  ____________________________________________________________________________________________  ____________________________________________________________________________________________  Drug Holidays (Slow)  What is a "Drug Holiday"? Drug Holiday: is the name given to the period of time during which a patient stops taking a medication(s) for the purpose of eliminating tolerance to the drug.  Benefits Improved effectiveness of opioids. Decreased opioid dose needed to achieve benefits. Improved pain with lesser dose.  What is tolerance? Tolerance: is the progressive decreased in effectiveness of a drug due to its repetitive use. With repetitive use, the body gets use to the medication and as a consequence, it loses its effectiveness. This is a common problem seen with opioid pain medications. As a result, a larger dose of the drug is needed to achieve the same effect that used to be obtained with a smaller dose.  How long should a "Drug Holiday" last? You should stay off of the pain medicine for at least 14 consecutive days. (2 weeks)  Should I stop the medicine "cold turkey"? No. You should always coordinate with your Pain Specialist so that he/she can provide you with the correct medication dose to make the transition as smoothly as possible.  How do I stop the medicine? Slowly. You will be instructed to decrease the daily amount of pills that you take by one (1) pill every seven (7) days. This is called a "slow downward taper" of your dose. For example: if you normally take four (4) pills per day, you will  be asked to drop this dose to three (3) pills per day for seven (7) days, then to two (2) pills per day for seven (7) days, then to one (1) per day for seven (7) days, and at the end of those last seven (7) days, this is when the "Drug Holiday" would start.   Will I have withdrawals? By doing a "slow downward taper" like this one, it is unlikely that you will experience any significant withdrawal symptoms. Typically, what triggers withdrawals is the sudden stop of a high dose opioid therapy. Withdrawals can usually be avoided by slowly decreasing the dose over a prolonged period of time. If you do not follow these instructions and decide to stop your medication abruptly, withdrawals may be possible.  What are withdrawals? Withdrawals: refers to the wide range of symptoms that occur after stopping or dramatically reducing opiate drugs after heavy and prolonged use. Withdrawal symptoms do not occur to patients that use low dose opioids, or those who take the medication sporadically. Contrary to benzodiazepine (example: Valium, Xanax, etc.) or alcohol withdrawals ("Delirium Tremens"), opioid withdrawals are not lethal. Withdrawals are the physical manifestation of the body getting rid of the excess receptors.  Expected Symptoms Early symptoms of withdrawal may include: Agitation Anxiety Muscle aches Increased tearing Insomnia Runny nose Sweating Yawning  Late symptoms of withdrawal may include: Abdominal cramping Diarrhea Dilated pupils Goose bumps Nausea Vomiting  Will I experience withdrawals? Due to the slow nature of the taper, it is very unlikely that you will experience any.  What is a slow taper? Taper: refers to the gradual decrease in dose.  (Last update: 08/07/2019) ____________________________________________________________________________________________   ____________________________________________________________________________________________  CBD (cannabidiol) &  Delta-8 (Delta-8 tetrahydrocannabinol) WARNING  Intro: Cannabidiol (CBD) and tetrahydrocannabinol (THC), are two natural compounds found in plants of the Cannabis genus. They can both be extracted   from hemp or cannabis. Hemp and cannabis come from the Cannabis sativa plant. Both compounds interact with your body's endocannabinoid system, but they have very different effects. CBD does not produce the high sensation associated with cannabis. Delta-8 tetrahydrocannabinol, also known as delta-8 THC, is a psychoactive substance found in the Cannabis sativa plant, of which marijuana and hemp are two varieties. THC is responsible for the high associated with the illicit use of marijuana.  Applicable to: All individuals currently taking or considering taking CBD (cannabidiol) and, more important, all patients taking opioid analgesic controlled substances (pain medication). (Example: oxycodone; oxymorphone; hydrocodone; hydromorphone; morphine; methadone; tramadol; tapentadol; fentanyl; buprenorphine; butorphanol; dextromethorphan; meperidine; codeine; etc.)  Legal status: CBD remains a Schedule I drug prohibited for any use. CBD is illegal with one exception. In the [place], CBD has a limited Food and Drug Administration (FDA) approval for the treatment of two specific types of epilepsy disorders. Only one CBD product has been approved by the FDA for this purpose: "Epidiolex". FDA is aware that some companies are marketing products containing cannabis and cannabis-derived compounds in ways that violate the Federal Food, Drug and Cosmetic Act (FD&C Act) and that may put the health and safety of consumers at risk. The FDA, a Federal agency, has not enforced the CBD status since 2018. UPDATE: (03/05/2021) The Drug Enforcement Agency (DEA) issued a letter stating that "delta" cannabinoids, including Delta-8-THCO and Delta-9-THCO, synthetically derived from hemp do not qualify as hemp and will be viewed as Schedule I  drugs. (Schedule I drugs, substances, or chemicals are defined as drugs with no currently accepted medical use and a high potential for abuse. Some examples of Schedule I drugs are: heroin, lysergic acid diethylamide (LSD), marijuana (cannabis), 3,4-methylenedioxymethamphetamine (ecstasy), methaqualone, and peyote.) (https://www.dea.gov)  Legality: Some manufacturers ship CBD products nationally, which is illegal. Often such products are sold online and are therefore available throughout the country. CBD is openly sold in head shops and health food stores in some states where such sales have not been explicitly legalized. Selling unapproved products with unsubstantiated therapeutic claims is not only a violation of the law, but also can put patients at risk, as these products have not been proven to be safe or effective. Federal illegality makes it difficult to conduct research on CBD.  Reference: "FDA Regulation of Cannabis and Cannabis-Derived Products, Including Cannabidiol (CBD)" - https://www.fda.gov/news-events/public-health-focus/fda-regulation-cannabis-and-cannabis-derived-products-including-cannabidiol-cbd  Warning: CBD is not FDA approved and has not undergo the same manufacturing controls as prescription drugs.  This means that the purity and safety of available CBD may be questionable. Most of the time, despite manufacturer's claims, it is contaminated with THC (delta-9-tetrahydrocannabinol - the chemical in marijuana responsible for the "HIGH").  When this is the case, the THC contaminant will trigger a positive urine drug screen (UDS) test for Marijuana (carboxy-THC). Because a positive UDS for any illicit substance is a violation of our medication agreement, your opioid analgesics (pain medicine) may be permanently discontinued. The FDA recently put out a warning about 5 things that everyone should be aware of regarding Delta-8 THC: Delta-8 THC products have not been evaluated or approved by  the FDA for safe use and may be marketed in ways that put the public health at risk. The FDA has received adverse event reports involving delta-8 THC-containing products. Delta-8 THC has psychoactive and intoxicating effects. Delta-8 THC manufacturing often involve use of potentially harmful chemicals to create the concentrations of delta-8 THC claimed in the marketplace. The final delta-8 THC product may have potentially   harmful by-products (contaminants) due to the chemicals used in the process. Manufacturing of delta-8 THC products may occur in uncontrolled or unsanitary settings, which may lead to the presence of unsafe contaminants or other potentially harmful substances. Delta-8 THC products should be kept out of the reach of children and pets.  MORE ABOUT CBD  General Information: CBD was discovered in 1940 and it is a derivative of the cannabis sativa genus plants (Marijuana and Hemp). It is one of the 113 identified substances found in Marijuana. It accounts for up to 40% of the plant's extract. As of 2018, preliminary clinical studies on CBD included research for the treatment of anxiety, movement disorders, and pain. CBD is available and consumed in multiple forms, including inhalation of smoke or vapor, as an aerosol spray, and by mouth. It may be supplied as an oil containing CBD, capsules, dried cannabis, or as a liquid solution. CBD is thought not to be as psychoactive as THC (delta-9-tetrahydrocannabinol - the chemical in marijuana responsible for the "HIGH"). Studies suggest that CBD may interact with different biological target receptors in the body, including cannabinoid and other neurotransmitter receptors. As of 2018 the mechanism of action for its biological effects has not been determined.  Side-effects  Adverse reactions: Dry mouth, diarrhea, decreased appetite, fatigue, drowsiness, malaise, weakness, sleep disturbances, and others.  Drug interactions: CBC may interact with other  medications such as blood-thinners. Because CBD causes drowsiness on its own, it also increases the drowsiness caused by other medications, including antihistamines (such as Benadryl), benzodiazepines (Xanax, Ativan, Valium), antipsychotics, antidepressants and opioids, as well as alcohol and supplements such as kava, melatonin and St. John's Wort. Be cautious with the following combinations:   Brivaracetam (Briviact) Brivaracetam is changed and broken down by the body. CBD might decrease how quickly the body breaks down brivaracetam. This might increase levels of brivaracetam in the body.  Caffeine Caffeine is changed and broken down by the body. CBD might decrease how quickly the body breaks down caffeine. This might increase levels of caffeine in the body.  Carbamazepine (Tegretol) Carbamazepine is changed and broken down by the body. CBD might decrease how quickly the body breaks down carbamazepine. This might increase levels of carbamazepine in the body and increase its side effects.  Citalopram (Celexa) Citalopram is changed and broken down by the body. CBD might decrease how quickly the body breaks down citalopram. This might increase levels of citalopram in the body and increase its side effects.  Clobazam (Onfi) Clobazam is changed and broken down by the liver. CBD might decrease how quickly the liver breaks down clobazam. This might increase the effects and side effects of clobazam.  Eslicarbazepine (Aptiom) Eslicarbazepine is changed and broken down by the body. CBD might decrease how quickly the body breaks down eslicarbazepine. This might increase levels of eslicarbazepine in the body by a small amount.  Everolimus (Zostress) Everolimus is changed and broken down by the body. CBD might decrease how quickly the body breaks down everolimus. This might increase levels of everolimus in the body.  Lithium Taking higher doses of CBD might increase levels of lithium. This can increase  the risk of lithium toxicity.  Medications changed by the liver (Cytochrome P450 1A1 (CYP1A1) substrates) Some medications are changed and broken down by the liver. CBD might change how quickly the liver breaks down these medications. This could change the effects and side effects of these medications.  Medications changed by the liver (Cytochrome P450 1A2 (CYP1A2) substrates) Some medications are   changed and broken down by the liver. CBD might change how quickly the liver breaks down these medications. This could change the effects and side effects of these medications.  Medications changed by the liver (Cytochrome P450 1B1 (CYP1B1) substrates) Some medications are changed and broken down by the liver. CBD might change how quickly the liver breaks down these medications. This could change the effects and side effects of these medications.  Medications changed by the liver (Cytochrome P450 2A6 (CYP2A6) substrates) Some medications are changed and broken down by the liver. CBD might change how quickly the liver breaks down these medications. This could change the effects and side effects of these medications.  Medications changed by the liver (Cytochrome P450 2B6 (CYP2B6) substrates) Some medications are changed and broken down by the liver. CBD might change how quickly the liver breaks down these medications. This could change the effects and side effects of these medications.  Medications changed by the liver (Cytochrome P450 2C19 (CYP2C19) substrates) Some medications are changed and broken down by the liver. CBD might change how quickly the liver breaks down these medications. This could change the effects and side effects of these medications.  Medications changed by the liver (Cytochrome P450 2C8 (CYP2C8) substrates) Some medications are changed and broken down by the liver. CBD might change how quickly the liver breaks down these medications. This could change the effects and side effects  of these medications.  Medications changed by the liver (Cytochrome P450 2C9 (CYP2C9) substrates) Some medications are changed and broken down by the liver. CBD might change how quickly the liver breaks down these medications. This could change the effects and side effects of these medications.  Medications changed by the liver (Cytochrome P450 2D6 (CYP2D6) substrates) Some medications are changed and broken down by the liver. CBD might change how quickly the liver breaks down these medications. This could change the effects and side effects of these medications.  Medications changed by the liver (Cytochrome P450 2E1 (CYP2E1) substrates) Some medications are changed and broken down by the liver. CBD might change how quickly the liver breaks down these medications. This could change the effects and side effects of these medications.  Medications changed by the liver (Cytochrome P450 3A4 (CYP3A4) substrates) Some medications are changed and broken down by the liver. CBD might change how quickly the liver breaks down these medications. This could change the effects and side effects of these medications.  Medications changed by the liver (Glucuronidated drugs) Some medications are changed and broken down by the liver. CBD might change how quickly the liver breaks down these medications. This could change the effects and side effects of these medications.  Medications that decrease the breakdown of other medications by the liver (Cytochrome P450 2C19 (CYP2C19) inhibitors) CBD is changed and broken down by the liver. Some drugs decrease how quickly the liver changes and breaks down CBD. This could change the effects and side effects of CBD.  Medications that decrease the breakdown of other medications in the liver (Cytochrome P450 3A4 (CYP3A4) inhibitors) CBD is changed and broken down by the liver. Some drugs decrease how quickly the liver changes and breaks down CBD. This could change the effects  and side effects of CBD.  Medications that increase breakdown of other medications by the liver (Cytochrome P450 3A4 (CYP3A4) inducers) CBD is changed and broken down by the liver. Some drugs increase how quickly the liver changes and breaks down CBD. This could change the effects and side effects of   CBD.  Medications that increase the breakdown of other medications by the liver (Cytochrome P450 2C19 (CYP2C19) inducers) CBD is changed and broken down by the liver. Some drugs increase how quickly the liver changes and breaks down CBD. This could change the effects and side effects of CBD.  Methadone (Dolophine) Methadone is broken down by the liver. CBD might decrease how quickly the liver breaks down methadone. Taking cannabidiol along with methadone might increase the effects and side effects of methadone.  Rufinamide (Banzel) Rufinamide is changed and broken down by the body. CBD might decrease how quickly the body breaks down rufinamide. This might increase levels of rufinamide in the body by a small amount.  Sedative medications (CNS depressants) CBD might cause sleepiness and slowed breathing. Some medications, called sedatives, can also cause sleepiness and slowed breathing. Taking CBD with sedative medications might cause breathing problems and/or too much sleepiness.  Sirolimus (Rapamune) Sirolimus is changed and broken down by the body. CBD might decrease how quickly the body breaks down sirolimus. This might increase levels of sirolimus in the body.  Stiripentol (Diacomit) Stiripentol is changed and broken down by the body. CBD might decrease how quickly the body breaks down stiripentol. This might increase levels of stiripentol in the body and increase its side effects.  Tacrolimus (Prograf) Tacrolimus is changed and broken down by the body. CBD might decrease how quickly the body breaks down tacrolimus. This might increase levels of tacrolimus in the body.  Tamoxifen  (Soltamox) Tamoxifen is changed and broken down by the body. CBD might affect how quickly the body breaks down tamoxifen. This might affect levels of tamoxifen in the body.  Topiramate (Topamax) Topiramate is changed and broken down by the body. CBD might decrease how quickly the body breaks down topiramate. This might increase levels of topiramate in the body by a small amount.  Valproate Valproic acid can cause liver injury. Taking cannabidiol with valproic acid might increase the chance of liver injury. CBD and/or valproic acid might need to be stopped, or the dose might need to be reduced.  Warfarin (Coumadin) CBD might increase levels of warfarin, which can increase the risk for bleeding. CBD and/or warfarin might need to be stopped, or the dose might need to be reduced.  Zonisamide Zonisamide is changed and broken down by the body. CBD might decrease how quickly the body breaks down zonisamide. This might increase levels of zonisamide in the body by a small amount. (Last update: 03/17/2021) ____________________________________________________________________________________________  Naloxone Nasal Spray What is this medication? NALOXONE (nal OX one) treats opioid overdose, which causes slow or shallow breathing, severe drowsiness, or trouble staying awake. Call emergency services after using this medication. You may need additional treatment. Naloxone works by reversing the effects of opioids. It belongs to a group of medications called opioid blockers. This medicine may be used for other purposes; ask your health care provider or pharmacist if you have questions. COMMON BRAND NAME(S): Kloxxado, Narcan What should I tell my care team before I take this medication? They need to know if you have any of these conditions: Heart disease Substance use disorder An unusual or allergic reaction to naloxone, other medications, foods, dyes, or preservatives Pregnant or trying to get  pregnant Breast-feeding How should I use this medication? This medication is for use in the nose. Lay the person on their back. Support their neck with your hand and allow the head to tilt back before giving the medication. The nasal spray should be given into   1 nostril. After giving the medication, move the person onto their side. Do not remove or test the nasal spray until ready to use. Get emergency medical help right away after giving the first dose of this medication, even if the person wakes up. You should be familiar with how to recognize the signs and symptoms of a narcotic overdose. If more doses are needed, give the additional dose in the other nostril. Talk to your care team about the use of this medication in children. While this medication may be prescribed for children as young as newborns for selected conditions, precautions do apply. Overdosage: If you think you have taken too much of this medicine contact a poison control center or emergency room at once. NOTE: This medicine is only for you. Do not share this medicine with others. What if I miss a dose? This does not apply. What may interact with this medication? This is only used during an emergency. No interactions are expected during emergency use. This list may not describe all possible interactions. Give your health care provider a list of all the medicines, herbs, non-prescription drugs, or dietary supplements you use. Also tell them if you smoke, drink alcohol, or use illegal drugs. Some items may interact with your medicine. What should I watch for while using this medication? Keep this medication ready for use in the case of an opioid overdose. Make sure that you have the phone number of your care team and local hospital ready. You may need to have additional doses of this medication. Each nasal spray contains a single dose. Some emergencies may require additional doses. After use, bring the treated person to the nearest  hospital or call 911. Make sure the treating care team knows that the person has received a dose of this medication. You will receive additional instructions on what to do during and after use of this medication before an emergency occurs. What side effects may I notice from receiving this medication? Side effects that you should report to your care team as soon as possible: Allergic reactions--skin rash, itching, hives, swelling of the face, lips, tongue, or throat Side effects that usually do not require medical attention (report these to your care team if they continue or are bothersome): Constipation Dryness or irritation inside the nose Headache Increase in blood pressure Muscle spasms Stuffy nose Toothache This list may not describe all possible side effects. Call your doctor for medical advice about side effects. You may report side effects to FDA at 1-800-FDA-1088. Where should I keep my medication? Keep out of the reach of children and pets. Store between 20 and 25 degrees C (68 and 77 degrees F). Do not freeze. Throw away any unused medication after the expiration date. Keep in original box until ready to use. NOTE: This sheet is a summary. It may not cover all possible information. If you have questions about this medicine, talk to your doctor, pharmacist, or health care provider.  2023 Elsevier/Gold Standard (2020-09-11 00:00:00) ____________________________________________________________________________________________  General Risks and Possible Complications  Patient Responsibilities: It is important that you read this as it is part of your informed consent. It is our duty to inform you of the risks and possible complications associated with treatments offered to you. It is your responsibility as a patient to read this and to ask questions about anything that is not clear or that you believe was not covered in this document.  Patient's Rights: You have the right to refuse  treatment. You also have  the right to change your mind, even after initially having agreed to have the treatment done. However, under this last option, if you wait until the last second to change your mind, you may be charged for the materials used up to that point.  Introduction: Medicine is not an Chief Strategy Officer. Everything in Medicine, including the lack of treatment(s), carries the potential for danger, harm, or loss (which is by definition: Risk). In Medicine, a complication is a secondary problem, condition, or disease that can aggravate an already existing one. All treatments carry the risk of possible complications. The fact that a side effects or complications occurs, does not imply that the treatment was conducted incorrectly. It must be clearly understood that these can happen even when everything is done following the highest safety standards.  No treatment: You can choose not to proceed with the proposed treatment alternative. The "PRO(s)" would include: avoiding the risk of complications associated with the therapy. The "CON(s)" would include: not getting any of the treatment benefits. These benefits fall under one of three categories: diagnostic; therapeutic; and/or palliative. Diagnostic benefits include: getting information which can ultimately lead to improvement of the disease or symptom(s). Therapeutic benefits are those associated with the successful treatment of the disease. Finally, palliative benefits are those related to the decrease of the primary symptoms, without necessarily curing the condition (example: decreasing the pain from a flare-up of a chronic condition, such as incurable terminal cancer).  General Risks and Complications: These are associated to most interventional treatments. They can occur alone, or in combination. They fall under one of the following six (6) categories: no benefit or worsening of symptoms; bleeding; infection; nerve damage; allergic reactions; and/or  death. No benefits or worsening of symptoms: In Medicine there are no guarantees, only probabilities. No healthcare provider can ever guarantee that a medical treatment will work, they can only state the probability that it may. Furthermore, there is always the possibility that the condition may worsen, either directly, or indirectly, as a consequence of the treatment. Bleeding: This is more common if the patient is taking a blood thinner, either prescription or over the counter (example: Goody Powders, Fish oil, Aspirin, Garlic, etc.), or if suffering a condition associated with impaired coagulation (example: Hemophilia, cirrhosis of the liver, low platelet counts, etc.). However, even if you do not have one on these, it can still happen. If you have any of these conditions, or take one of these drugs, make sure to notify your treating physician. Infection: This is more common in patients with a compromised immune system, either due to disease (example: diabetes, cancer, human immunodeficiency virus [HIV], etc.), or due to medications or treatments (example: therapies used to treat cancer and rheumatological diseases). However, even if you do not have one on these, it can still happen. If you have any of these conditions, or take one of these drugs, make sure to notify your treating physician. Nerve Damage: This is more common when the treatment is an invasive one, but it can also happen with the use of medications, such as those used in the treatment of cancer. The damage can occur to small secondary nerves, or to large primary ones, such as those in the spinal cord and brain. This damage may be temporary or permanent and it may lead to impairments that can range from temporary numbness to permanent paralysis and/or brain death. Allergic Reactions: Any time a substance or material comes in contact with our body, there is the possibility of an  allergic reaction. These can range from a mild skin rash (contact  dermatitis) to a severe systemic reaction (anaphylactic reaction), which can result in death. Death: In general, any medical intervention can result in death, most of the time due to an unforeseen complication. __________     Epidural Steroid Injection Patient Information  Description: The epidural space surrounds the nerves as they exit the spinal cord.  In some patients, the nerves can be compressed and inflamed by a bulging disc or a tight spinal canal (spinal stenosis).  By injecting steroids into the epidural space, we can bring irritated nerves into direct contact with a potentially helpful medication.  These steroids act directly on the irritated nerves and can reduce swelling and inflammation which often leads to decreased pain.  Epidural steroids may be injected anywhere along the spine and from the neck to the low back depending upon the location of your pain.   After numbing the skin with local anesthetic (like Novocaine), a small needle is passed into the epidural space slowly.  You may experience a sensation of pressure while this is being done.  The entire block usually last less than 10 minutes.  Conditions which may be treated by epidural steroids:  Low back and leg pain Neck and arm pain Spinal stenosis Post-laminectomy syndrome Herpes zoster (shingles) pain Pain from compression fractures  Preparation for the injection:  Do not eat any solid food or dairy products within 8 hours of your appointment.  You may drink clear liquids up to 3 hours before appointment.  Clear liquids include water, black coffee, juice or soda.  No milk or cream please. You may take your regular medication, including pain medications, with a sip of water before your appointment  Diabetics should hold regular insulin (if taken separately) and take 1/2 normal NPH dos the morning of the procedure.  Carry some sugar containing items with you to your appointment. A driver must accompany you and be  prepared to drive you home after your procedure.  Bring all your current medications with your. An IV may be inserted and sedation may be given at the discretion of the physician.   A blood pressure cuff, EKG and other monitors will often be applied during the procedure.  Some patients may need to have extra oxygen administered for a short period. You will be asked to provide medical information, including your allergies, prior to the procedure.  We must know immediately if you are taking blood thinners (like Coumadin/Warfarin)  Or if you are allergic to IV iodine contrast (dye). We must know if you could possible be pregnant.  Possible side-effects: Bleeding from needle site Infection (rare, may require surgery) Nerve injury (rare) Numbness & tingling (temporary) Difficulty urinating (rare, temporary) Spinal headache ( a headache worse with upright posture) Light -headedness (temporary) Pain at injection site (several days) Decreased blood pressure (temporary) Weakness in arm/leg (temporary) Pressure sensation in back/neck (temporary)  Call if you experience: Fever/chills associated with headache or increased back/neck pain. Headache worsened by an upright position. New onset weakness or numbness of an extremity below the injection site Hives or difficulty breathing (go to the emergency room) Inflammation or drainage at the infection site Severe back/neck pain Any new symptoms which are concerning to you  Please note:  Although the local anesthetic injected can often make your back or neck feel good for several hours after the injection, the pain will likely return.  It takes 3-7 days for steroids to work in the  epidural space.  You may not notice any pain relief for at least that one week.  If effective, we will often do a series of three injections spaced 3-6 weeks apart to maximally decrease your pain.  After the initial series, we generally will wait several months before  considering a repeat injection of the same type.  If you have any questions, please call [phone] [facility]

## 2021-11-11 NOTE — Telephone Encounter (Signed)
Pt called stating he has left several messages and no one has called him back. He is wanting to check status of his diabetic shoes.

## 2021-11-11 NOTE — Telephone Encounter (Signed)
Spoke with patient and informed him that his order has been placed. I will be resending paperwork to PCP as it will expire. Will follow up with patient next week to give him the estimated receiving date. Patient was satisfied and will await a call back with updates

## 2021-11-12 ENCOUNTER — Ambulatory Visit (INDEPENDENT_AMBULATORY_CARE_PROVIDER_SITE_OTHER): Payer: Medicare HMO | Admitting: Family Medicine

## 2021-11-12 ENCOUNTER — Encounter: Payer: Self-pay | Admitting: Family Medicine

## 2021-11-12 VITALS — BP 134/83 | HR 72 | Ht 72.0 in | Wt 332.0 lb

## 2021-11-12 DIAGNOSIS — Z794 Long term (current) use of insulin: Secondary | ICD-10-CM | POA: Diagnosis not present

## 2021-11-12 DIAGNOSIS — E1142 Type 2 diabetes mellitus with diabetic polyneuropathy: Secondary | ICD-10-CM | POA: Diagnosis not present

## 2021-11-12 DIAGNOSIS — Z23 Encounter for immunization: Secondary | ICD-10-CM

## 2021-11-12 DIAGNOSIS — M47816 Spondylosis without myelopathy or radiculopathy, lumbar region: Secondary | ICD-10-CM

## 2021-11-12 DIAGNOSIS — M170 Bilateral primary osteoarthritis of knee: Secondary | ICD-10-CM | POA: Diagnosis not present

## 2021-11-12 DIAGNOSIS — E1169 Type 2 diabetes mellitus with other specified complication: Secondary | ICD-10-CM | POA: Diagnosis not present

## 2021-11-12 DIAGNOSIS — R6889 Other general symptoms and signs: Secondary | ICD-10-CM | POA: Diagnosis not present

## 2021-11-12 LAB — POCT GLYCOSYLATED HEMOGLOBIN (HGB A1C): Hemoglobin A1C: 6.1 % — AB (ref 4.0–5.6)

## 2021-11-12 MED ORDER — OZEMPIC (1 MG/DOSE) 4 MG/3ML ~~LOC~~ SOPN: 1.0000 mg | PEN_INJECTOR | SUBCUTANEOUS | 1 refills | Status: DC

## 2021-11-12 NOTE — Progress Notes (Addendum)
Subjective:    Patient ID: James [name], male    DOB: [birth year], 61 y.o.   MRN: [id number]  James [name] is a 61 y.o. male presenting on 11/12/2021 for Diabetes   HPI  Chronic Pain Back Pain, Knee Joint pain Osteoarthritis See prior note for background medical history on this topic Followed by Dr Dossie Arbour at [facility] Injection with improvement    He is doing well on H[place]ound PMD He would benefit from Rollator walker with seat for some situational scenarios for mobility   Type 2 Diabetes He has reduced [name] insulin to 20u now, doing better, on Metformin 1000 BID, and on Ozempic 0.'5mg'$  weekly inj interested in dose increase He says CBG controlled   DM Shoes needed. Followed by Podiatry. They were ordered previously. We will follow up with their office [facility] to request form.   Health Maintenance: Flu Shot and Shingrix #1 today     11/04/2021    8:59 AM 07/21/2021    8:20 AM 03/19/2021   11:56 AM  Depression screen PHQ 2/9  Decreased Interest 0 0 1  Down, Depressed, Hopeless 0 0 1  PHQ - 2 Score 0 0 2  Altered sleeping   2  Tired, decreased energy   2  Change in appetite   2  Feeling bad or failure about yourself    0  Trouble concentrating   0  Moving slowly or fidgety/restless   0  Suicidal thoughts   0  PHQ-9 Score   8  Difficult doing work/chores   Somewhat difficult    Social History   Tobacco Use   Smoking status: Some Days    Packs/day: 0.10    Years: 25.00    Total pack years: 2.50    Types: Cigarettes   Smokeless tobacco: Never  Vaping Use   Vaping Use: Never used  Substance Use Topics   Alcohol use: Never   Drug use: Never    Review of Systems Per HPI unless specifically indicated above     Objective:    BP 134/83   Pulse 72   Ht 6' (1.829 m)   Wt (!) 332 lb (150.6 kg)   SpO2 95%   BMI 45.03 kg/m   Wt Readings from Last 3 Encounters:  11/12/21 (!) 332 lb (150.6 kg)  11/04/21 (!) 332 lb (150.6 kg)   10/20/21 (!) 339 lb (153.8 kg)    Physical Exam Vitals and nursing note reviewed.  Constitutional:      General: He is not in acute distress.    Appearance: He is well-developed. He is obese. He is not diaphoretic.     Comments: Well-appearing, comfortable, cooperative  HENT:     Head: Normocephalic and atraumatic.  Eyes:     General:        Right eye: No discharge.        Left eye: No discharge.     Conjunctiva/sclera: Conjunctivae normal.  Neck:     Thyroid: No thyromegaly.  Cardiovascular:     Rate and Rhythm: Normal rate and regular rhythm.     Pulses: Normal pulses.     Heart sounds: Normal heart sounds. No murmur heard. Pulmonary:     Effort: Pulmonary effort is normal. No respiratory distress.     Breath sounds: Normal breath sounds. No wheezing or rales.  Musculoskeletal:        General: Normal range of motion.     Cervical back: Normal range of motion and neck  supple.     Comments: Electric wheelchair bound  Lymphadenopathy:     Cervical: No cervical adenopathy.  Skin:    General: Skin is warm and dry.     Findings: No erythema or rash.  Neurological:     Mental Status: He is alert and oriented to person, place, and time. Mental status is at baseline.  Psychiatric:        Behavior: Behavior normal.     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 Evidence of callus formation both feet, heels and mid foot  Foot exam done by Podiatry previously  Plan - Proceed with ordering Diabetic Shoes, will await request for orders. - Patient would benefit from Diabetic Shoes due to neuropathy with callus formation and great toe deformity causing pain, diabetes control is improving on current regimen, and I am continuing to monitor and manage diabetes.   Vaccines today shingrix #1 and Flu vaccine  Morbid Obesity Chronic Pain Syndrome, Low Back Lumbar DDD  Utilizes electric wheelchair with good improvement  Will write handwritten rx for patient DME Rollator walker with seat, heavy duty, ht wt given, he can submit and we can sign off on other orders. He will use in situational scenario for mobility    Orders Placed This Encounter  Procedures   Flu Vaccine QUAD 621moM (Fluarix, Fluzone & Alfiuria Quad PF)   Varicella-zoster vaccine IM   POCT HgB A1C     Meds ordered this encounter  Medications   OZEMPIC, 1 MG/DOSE, 4 MG/3ML SOPN    Sig: Inject 1 mg into the skin once a week.    Dispense:  9 mL    Refill:  1     Follow up plan: Return in about 3 months (around 02/12/2022).   AlNobie PutnamDO [facility] 11/12/2021, 10:07 AM

## 2021-11-12 NOTE — Patient Instructions (Addendum)
Thank you for coming to the office today.  Recent Labs    12/09/20 1115 06/25/21 0955 11/12/21 1014  HGBA1C 5.7* 6.0* 6.1*    We will look for that form and the diabetic shoe form  Rollator prescription can take it to any of these places.  [facility] [address] [place], [place] [address] Ph: [place]. [address] [place], [place] [address] Ph: [phone]  --------------------------------  [facility] [address] [place], [place] [address] Open until [place] Phone: [phone]  [facility] [place], [place] [address] Ph [phone]  Please schedule a Follow-up Appointment to: No follow-ups on file.  If you have any other questions or concerns, please feel free to call the office or send a message through [place]. You may also schedule an earlier appointment if necessary.  Additionally, you may be receiving a survey about your experience at our office within a few days to 1 week by e-mail or mail. We value your feedback.  Nobie Putnam, DO [place]

## 2021-11-15 ENCOUNTER — Ambulatory Visit: Payer: Medicare HMO | Attending: Pain Medicine | Admitting: Pain Medicine

## 2021-11-15 ENCOUNTER — Encounter: Payer: Self-pay | Admitting: Pain Medicine

## 2021-11-15 VITALS — BP 124/86 | HR 80 | Temp 97.2°F | Ht 72.0 in | Wt 329.0 lb

## 2021-11-15 DIAGNOSIS — M25512 Pain in left shoulder: Secondary | ICD-10-CM | POA: Diagnosis not present

## 2021-11-15 DIAGNOSIS — M1611 Unilateral primary osteoarthritis, right hip: Secondary | ICD-10-CM

## 2021-11-15 DIAGNOSIS — M160 Bilateral primary osteoarthritis of hip: Secondary | ICD-10-CM

## 2021-11-15 DIAGNOSIS — M25551 Pain in right hip: Secondary | ICD-10-CM | POA: Insufficient documentation

## 2021-11-15 DIAGNOSIS — G894 Chronic pain syndrome: Secondary | ICD-10-CM

## 2021-11-15 DIAGNOSIS — M1612 Unilateral primary osteoarthritis, left hip: Secondary | ICD-10-CM | POA: Diagnosis not present

## 2021-11-15 DIAGNOSIS — M79604 Pain in right leg: Secondary | ICD-10-CM | POA: Insufficient documentation

## 2021-11-15 DIAGNOSIS — M1712 Unilateral primary osteoarthritis, left knee: Secondary | ICD-10-CM | POA: Diagnosis present

## 2021-11-15 DIAGNOSIS — M25561 Pain in right knee: Secondary | ICD-10-CM | POA: Insufficient documentation

## 2021-11-15 DIAGNOSIS — M25562 Pain in left knee: Secondary | ICD-10-CM | POA: Insufficient documentation

## 2021-11-15 DIAGNOSIS — M5441 Lumbago with sciatica, right side: Secondary | ICD-10-CM | POA: Insufficient documentation

## 2021-11-15 DIAGNOSIS — M25552 Pain in left hip: Secondary | ICD-10-CM | POA: Diagnosis not present

## 2021-11-15 DIAGNOSIS — Z79899 Other long term (current) drug therapy: Secondary | ICD-10-CM | POA: Diagnosis present

## 2021-11-15 DIAGNOSIS — R6889 Other general symptoms and signs: Secondary | ICD-10-CM | POA: Diagnosis not present

## 2021-11-15 DIAGNOSIS — Z79891 Long term (current) use of opiate analgesic: Secondary | ICD-10-CM

## 2021-11-15 DIAGNOSIS — M79605 Pain in left leg: Secondary | ICD-10-CM | POA: Insufficient documentation

## 2021-11-15 DIAGNOSIS — G8929 Other chronic pain: Secondary | ICD-10-CM | POA: Diagnosis not present

## 2021-11-15 DIAGNOSIS — M5442 Lumbago with sciatica, left side: Secondary | ICD-10-CM | POA: Diagnosis not present

## 2021-11-15 MED ORDER — OXYCODONE HCL 10 MG PO TABS: 10.0000 mg | ORAL_TABLET | Freq: Two times a day (BID) | ORAL | 0 refills | Status: DC

## 2021-11-15 MED ORDER — NALOXONE HCL 4 MG/0.1ML NA LIQD: 1.0000 | NASAL | 0 refills | Status: DC | PRN

## 2021-11-15 NOTE — Telephone Encounter (Signed)
Documented on related encounter

## 2021-11-15 NOTE — Progress Notes (Signed)
Nursing Pain Medication Assessment:  Safety precautions to be maintained throughout the outpatient stay will include: orient to surroundings, keep bed in low position, maintain call bell within reach at all times, provide assistance with transfer out of bed and ambulation.  Medication Inspection Compliance: Pill count conducted under aseptic conditions, in front of the patient. Neither the pills nor the bottle was removed from the patient's sight at any time. Once count was completed pills were immediately returned to the patient in their original bottle.  Medication: Oxycodone IR Pill/Patch Count:  29 of 60 pills remain Pill/Patch Appearance: Markings consistent with prescribed medication Bottle Appearance: Standard pharmacy container. Clearly labeled. Filled Date: 12 / 17 / 2023 Last Medication intake:  Today

## 2021-11-30 ENCOUNTER — Ambulatory Visit: Payer: Medicare HMO | Admitting: Pain Medicine

## 2021-11-30 ENCOUNTER — Encounter: Payer: Self-pay | Admitting: Pain Medicine

## 2021-11-30 ENCOUNTER — Ambulatory Visit
Admission: RE | Admit: 2021-11-30 | Discharge: 2021-11-30 | Disposition: A | Discharge status: Home / Self Care | Payer: Medicare HMO | Source: Ambulatory Visit | Attending: Pain Medicine | Admitting: Pain Medicine

## 2021-11-30 ENCOUNTER — Ambulatory Visit: Payer: Medicare HMO | Attending: Pain Medicine | Admitting: Pain Medicine

## 2021-11-30 VITALS — BP 140/98 | HR 67 | Temp 97.2°F | Resp 16 | Ht 72.0 in | Wt 320.0 lb

## 2021-11-30 DIAGNOSIS — M79604 Pain in right leg: Secondary | ICD-10-CM | POA: Diagnosis not present

## 2021-11-30 DIAGNOSIS — M5136 Other intervertebral disc degeneration, lumbar region: Secondary | ICD-10-CM | POA: Insufficient documentation

## 2021-11-30 DIAGNOSIS — R94131 Abnormal electromyogram [EMG]: Secondary | ICD-10-CM | POA: Diagnosis not present

## 2021-11-30 DIAGNOSIS — G8929 Other chronic pain: Secondary | ICD-10-CM | POA: Insufficient documentation

## 2021-11-30 DIAGNOSIS — M5126 Other intervertebral disc displacement, lumbar region: Secondary | ICD-10-CM | POA: Diagnosis not present

## 2021-11-30 DIAGNOSIS — Z6841 Body Mass Index (BMI) 40.0 and over, adult: Secondary | ICD-10-CM | POA: Diagnosis present

## 2021-11-30 DIAGNOSIS — E1169 Type 2 diabetes mellitus with other specified complication: Secondary | ICD-10-CM | POA: Insufficient documentation

## 2021-11-30 DIAGNOSIS — R700 Elevated erythrocyte sedimentation rate: Secondary | ICD-10-CM | POA: Insufficient documentation

## 2021-11-30 DIAGNOSIS — M5441 Lumbago with sciatica, right side: Secondary | ICD-10-CM | POA: Insufficient documentation

## 2021-11-30 DIAGNOSIS — E662 Morbid (severe) obesity with alveolar hypoventilation: Secondary | ICD-10-CM | POA: Insufficient documentation

## 2021-11-30 DIAGNOSIS — M5442 Lumbago with sciatica, left side: Secondary | ICD-10-CM | POA: Insufficient documentation

## 2021-11-30 DIAGNOSIS — M79605 Pain in left leg: Secondary | ICD-10-CM | POA: Diagnosis present

## 2021-11-30 DIAGNOSIS — D1779 Benign lipomatous neoplasm of other sites: Secondary | ICD-10-CM | POA: Diagnosis not present

## 2021-11-30 DIAGNOSIS — M25561 Pain in right knee: Secondary | ICD-10-CM | POA: Diagnosis present

## 2021-11-30 DIAGNOSIS — R937 Abnormal findings on diagnostic imaging of other parts of musculoskeletal system: Secondary | ICD-10-CM | POA: Diagnosis not present

## 2021-11-30 DIAGNOSIS — M48061 Spinal stenosis, lumbar region without neurogenic claudication: Secondary | ICD-10-CM | POA: Diagnosis not present

## 2021-11-30 DIAGNOSIS — M25562 Pain in left knee: Secondary | ICD-10-CM | POA: Diagnosis present

## 2021-11-30 DIAGNOSIS — M25552 Pain in left hip: Secondary | ICD-10-CM | POA: Diagnosis present

## 2021-11-30 DIAGNOSIS — M5416 Radiculopathy, lumbar region: Secondary | ICD-10-CM | POA: Insufficient documentation

## 2021-11-30 DIAGNOSIS — R7982 Elevated C-reactive protein (CRP): Secondary | ICD-10-CM | POA: Diagnosis present

## 2021-11-30 DIAGNOSIS — M25551 Pain in right hip: Secondary | ICD-10-CM | POA: Diagnosis present

## 2021-11-30 DIAGNOSIS — R6889 Other general symptoms and signs: Secondary | ICD-10-CM | POA: Diagnosis not present

## 2021-11-30 MED ORDER — LIDOCAINE HCL (PF) 2 % IJ SOLN
INTRAMUSCULAR | Status: AC
  Filled 2021-11-30: qty 10

## 2021-11-30 MED ORDER — TRIAMCINOLONE ACETONIDE 40 MG/ML IJ SUSP
40.0000 mg | Freq: Once | INTRAMUSCULAR | Status: AC
  Administered 2021-11-30: 40 mg

## 2021-11-30 MED ORDER — LACTATED RINGERS IV SOLN: Freq: Once | INTRAVENOUS | Status: DC

## 2021-11-30 MED ORDER — IOHEXOL 180 MG/ML  SOLN
INTRAMUSCULAR | Status: AC
  Filled 2021-11-30: qty 20

## 2021-11-30 MED ORDER — SODIUM CHLORIDE 0.9% FLUSH
2.0000 mL | Freq: Once | INTRAVENOUS | Status: AC
  Administered 2021-11-30: 2 mL

## 2021-11-30 MED ORDER — SODIUM CHLORIDE (PF) 0.9 % IJ SOLN
INTRAMUSCULAR | Status: AC
  Filled 2021-11-30: qty 10

## 2021-11-30 MED ORDER — ROPIVACAINE HCL 2 MG/ML IJ SOLN
INTRAMUSCULAR | Status: AC
  Filled 2021-11-30: qty 20

## 2021-11-30 MED ORDER — LIDOCAINE HCL 2 % IJ SOLN
20.0000 mL | Freq: Once | INTRAMUSCULAR | Status: AC
  Administered 2021-11-30: 100 mg

## 2021-11-30 MED ORDER — IOHEXOL 180 MG/ML  SOLN
10.0000 mL | Freq: Once | INTRAMUSCULAR | Status: AC
  Administered 2021-11-30: 10 mL via EPIDURAL

## 2021-11-30 MED ORDER — MIDAZOLAM HCL 5 MG/5ML IJ SOLN
0.5000 mg | Freq: Once | INTRAMUSCULAR | Status: AC
  Administered 2021-11-30: 2 mg via INTRAVENOUS

## 2021-11-30 MED ORDER — TRIAMCINOLONE ACETONIDE 40 MG/ML IJ SUSP
INTRAMUSCULAR | Status: AC
  Filled 2021-11-30: qty 1

## 2021-11-30 MED ORDER — MIDAZOLAM HCL 2 MG/2ML IJ SOLN
INTRAMUSCULAR | Status: AC
  Filled 2021-11-30: qty 2

## 2021-11-30 MED ORDER — ROPIVACAINE HCL 2 MG/ML IJ SOLN
2.0000 mL | Freq: Once | INTRAMUSCULAR | Status: AC
  Administered 2021-11-30: 2 mL via EPIDURAL

## 2021-11-30 MED ORDER — PENTAFLUOROPROP-TETRAFLUOROETH EX AERO
INHALATION_SPRAY | Freq: Once | CUTANEOUS | Status: AC
  Administered 2021-11-30: 30 via TOPICAL

## 2021-11-30 NOTE — Patient Instructions (Signed)
____________________________________________________________________________________________  Post-Procedure Discharge Instructions  Instructions: Apply ice:  Purpose: This will minimize any swelling and discomfort after procedure.  When: Day of procedure, as soon as you get home. How: Fill a plastic sandwich bag with crushed ice. Cover it with a small towel and apply to injection site. How long: (15 min on, 15 min off) Apply for 15 minutes then remove x 15 minutes.  Repeat sequence on day of procedure, until you go to bed. Apply heat:  Purpose: To treat any soreness and discomfort from the procedure. When: Starting the next day after the procedure. How: Apply heat to procedure site starting the day following the procedure. How long: May continue to repeat daily, until discomfort goes away. Food intake: Start with clear liquids (like water) and advance to regular food, as tolerated.  Physical activities: Keep activities to a minimum for the first 8 hours after the procedure. After that, then as tolerated. Driving: If you have received any sedation, be responsible and do not drive. You are not allowed to drive for 24 hours after having sedation. Blood thinner: (Applies only to those taking blood thinners) You may restart your blood thinner 6 hours after your procedure. Insulin: (Applies only to Diabetic patients taking insulin) As soon as you can eat, you may resume your normal dosing schedule. Infection prevention: Keep procedure site clean and dry. Shower daily and clean area with soap and water. Post-procedure Pain Diary: Extremely important that this be done correctly and accurately. Recorded information will be used to determine the next step in treatment. For the purpose of accuracy, follow these rules: Evaluate only the area treated. Do not report or include pain from an untreated area. For the purpose of this evaluation, ignore all other areas of pain, except for the treated area. After  your procedure, avoid taking a long nap and attempting to complete the pain diary after you wake up. Instead, set your alarm clock to go off every hour, on the hour, for the initial 8 hours after the procedure. Document the duration of the numbing medicine, and the relief you are getting from it. Do not go to sleep and attempt to complete it later. It will not be accurate. If you received sedation, it is likely that you were given a medication that may cause amnesia. Because of this, completing the diary at a later time may cause the information to be inaccurate. This information is needed to plan your care. Follow-up appointment: Keep your post-procedure follow-up evaluation appointment after the procedure (usually 2 weeks for most procedures, 6 weeks for radiofrequencies). DO NOT FORGET to bring you pain diary with you.   Expect: (What should I expect to see with my procedure?) From numbing medicine (AKA: Local Anesthetics): Numbness or decrease in pain. You may also experience some weakness, which if present, could last for the duration of the local anesthetic. Onset: Full effect within 15 minutes of injected. Duration: It will depend on the type of local anesthetic used. On the average, 1 to 8 hours.  From steroids (Applies only if steroids were used): Decrease in swelling or inflammation. Once inflammation is improved, relief of the pain will follow. Onset of benefits: Depends on the amount of swelling present. The more swelling, the longer it will take for the benefits to be seen. In some cases, up to 10 days. Duration: Steroids will stay in the system x 2 weeks. Duration of benefits will depend on multiple posibilities including persistent irritating factors. Side-effects: If present, they  may typically last 2 weeks (the duration of the steroids). Frequent: Cramps (if they occur, drink Gatorade and take over-the-counter Magnesium 450-500 mg once to twice a day); water retention with temporary  weight gain; increases in blood sugar; decreased immune system response; increased appetite. Occasional: Facial flushing (red, warm cheeks); mood swings; menstrual changes. Uncommon: Long-term decrease or suppression of natural hormones; bone thinning. (These are more common with higher doses or more frequent use. This is why we prefer that our patients avoid having any injection therapies in other practices.)  Very Rare: Severe mood changes; psychosis; aseptic necrosis. From procedure: Some discomfort is to be expected once the numbing medicine wears off. This should be minimal if ice and heat are applied as instructed.  Call if: (When should I call?) You experience numbness and weakness that gets worse with time, as opposed to wearing off. New onset bowel or bladder incontinence. (Applies only to procedures done in the spine)  Emergency Numbers: Durning business hours (Monday - Thursday, 8:00 AM - 4:00 PM) (Friday, 9:00 AM - 12:00 Noon): [phone] After hours: [phone] NOTE: If you are having a problem and are unable connect with, or to talk to a provider, then go to your nearest urgent care or emergency department. If the problem is serious and urgent, please call 911. ____________________________________________________________________________________________  ____________________________________________________________________________________________  Virtual Visits   What is a "Virtual Visit"? It is a Metallurgist (medical visit) that takes place on real time (NOT TEXT or E-MAIL) over the telephone or computer device (desktop, laptop, tablet, smart phone, etc.). It allows for more location flexibility between the patient and the healthcare provider.  Who decides when these types of visits will be used? The physician.  Who is eligible for these types of visits? Only those patients that can be reliably reached over the telephone.  What do you mean by  reliably? We do not have time to call everyone multiple times, therefore those that tend to screen calls and then call back later are not suitable candidates for this system. We understand how people are reluctant to pickup on "unknown" calls, therefore, we suggest adding our telephone numbers to your list of "CONTACT(s)". This way, you should be able to readily identify our calls when you receive one. All of our numbers are available below.   Who is not eligible? This option is not available for medication management encounters, specially for controlled substances. Patients on pain medications that fall under the category of controlled substances have to come in for "Face-to-Face" encounters. This is required for mandatory monitoring of these substances. You may be asked to provide a sample for an unannounced urine drug screening test (UDS), and we will need to count your pain pills. Not bringing your pills to be counted may result in no refill. Obviously, neither one of these can be done over the phone.  When will this type of visits be used? You can request a virtual visit whenever you are physically unable to attend a regular appointment. The decision will be made by the physician (or healthcare provider) on a case by case basis.   At what time will I be called? This is an excellent question. The providers will try to call you whenever they have time available. Do not expect to be called at any specific time. The secretaries will assign you a time for your virtual visit appointment, but this is done simply to keep a list of those patients that need to be called,  but not for the purpose of keeping a time schedule. Be advised that the call may come in anytime during the day, between the hours of 8:00 AM and 8::00 PM, depending on provider availability. We do understand that the system is not perfect. If you are unable to be available that day on a moments notice, then request an "in-person" appointment  rather than a "virtual visit".  Can I request my medication visits to be "Virtual"? Yes you may request it, but the decision is entirely up to the healthcare provider. Control substances require specific monitoring that requires Face-to-Face encounters. The number of encounters  and the extent of the monitoring is determined on a case by case basis.  Add a new contact to your smart phone and label it "PAIN CLINIC" Under this contact add the following numbers: Main: [phone] (Official Contact Number) Nurses: [phone] (These are outgoing only calling systems. Do not call this number.) Dr. Dossie Arbour: [phone] or [phone] (Outgoing calls only. Do not call this number.)  ____________________________________________________________________________________________

## 2021-11-30 NOTE — Progress Notes (Signed)
PROVIDER NOTE: Interpretation of information contained herein should be left to medically-trained personnel. Specific patient instructions are provided elsewhere under "Patient Instructions" section of medical record. This document was created in part using STT-dictation technology, any transcriptional errors that may result from this process are unintentional.  Patient: James [name] Type: Established DOB: 04/09/1960 MRN: [id number] PCP: James Hauser, DO  Service: Procedure DOS: 11/30/2021 Setting: Ambulatory Location: Ambulatory outpatient facility Delivery: Face-to-face Provider: Gaspar Cola, MD Specialty: Interventional Pain Management Specialty designation: 09 Location: Outpatient facility Ref. Prov.: James [name] *    Primary Reason for Visit: Interventional Pain Management Treatment. CC: Leg Pain (Right upper and lower, left upper and lower)  Procedure:           Type: Lumbar epidural steroid injection (LESI) (interlaminar) #2    Laterality: Right   Level:  L4-5 Level.  Imaging: Fluoroscopic guidance         Anesthesia: Local anesthesia (1-2% Lidocaine) Anxiolysis: None                 Sedation: No Sedation                       DOS: 11/30/2021  Performed by: James Cola, MD  Purpose: Diagnostic/Therapeutic Indications: Lumbar radicular pain of intraspinal etiology of more than 4 weeks that has failed to respond to conservative therapy and is severe enough to impact quality of life or function. 1. Lumbar nerve root impingement (Right: L4)   2. 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 Lumbar lateral recess stenosis (Right: L3-4) (Bilateral: L4-5)   4. Chronic lower extremity pain (1ry area of Pain) (Bilateral)   5. Chronic low back pain (4th area of Pain) (Bilateral) w/ sciatica (Bilateral)   6. DDD (degenerative disc disease), lumbar   7. 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 Herniated nucleus  pulposus, L3-4 (Right)   9. Lumbar central spinal stenosis, 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   10. Abnormal MRI, lumbar spine (05/21/2021)   11. Abnormal EMG (electromyogram) (03/16/2021)   12. Class 3 obesity with alveolar hypoventilation, serious comorbidity, and body mass index (BMI) of 45.0 to 49.9 in adult ([place])   13. Type 2 diabetes mellitus with morbid obesity ([place])   14. Elevated C-reactive protein (CRP)   15. Elevated sed rate    NAS-11 Pain score:   Pre-procedure: 10-Worst pain ever/10   Post-procedure: 7 /10     Position / Prep / Materials:  Position: Prone w/ head of the table raised (slight reverse trendelenburg) to facilitate breathing.  Prep solution: DuraPrep (Iodine Povacrylex [0.7% available iodine] and Isopropyl Alcohol, 74% w/w) Prep Area: Entire Posterior Lumbar Region from lower scapular tip down to mid buttocks area and from flank to flank. Materials:  Tray: Epidural tray Needle(s):  Type: Epidural needle (Tuohy) Gauge (G):  17 Length: Regular (3.5-in) Qty: 1  Pre-op H&P Assessment:  James [name] is a 61 y.o. (year old), male patient, seen today for interventional treatment. He  has a past surgical history that includes Colonoscopy with propofol; Appendectomy; and Colonoscopy with propofol (N/A, 04/23/2021). James [name] has a current medication list which includes the following prescription(s): albuterol, atorvastatin, hydrochlorothiazide, losartan, metformin, naloxone, oxycodone hcl, [START ON 12/25/2021] oxycodone hcl, [START ON 01/24/2022] oxycodone hcl, ozempic (1 mg/dose), and pregabalin, and the following Facility-Administered Medications: lactated ringers. His primarily concern today is the Leg Pain (Right upper and lower, left upper and lower)  Initial Vital Signs:  Pulse/HCG Rate: 67ECG Heart Rate: 89 Temp: (!) 97.2 F (36.2 C) Resp: 18 BP: 107/64 SpO2: 95 %  BMI: Estimated body mass index is 43.4 kg/m as calculated from the following:   Height as of  this encounter: 6' (1.829 m).   Weight as of this encounter: 320 lb (145.2 kg).  Risk Assessment: Allergies: Reviewed. He has No Known Allergies.  Allergy Precautions: None required Coagulopathies: Reviewed. None identified.  Blood-thinner therapy: None at this time Active Infection(s): Reviewed. None identified. James [name] is afebrile  Site Confirmation: James [name] was asked to confirm the procedure and laterality before marking the site Procedure checklist: Completed Consent: Before the procedure and under the influence of no sedative(s), amnesic(s), or anxiolytics, the patient was informed of the treatment options, risks and possible complications. To fulfill our ethical and legal obligations, as recommended by the American Medical Association's Code of Ethics, I have informed the patient of my clinical impression; the nature and purpose of the treatment or procedure; the risks, benefits, and possible complications of the intervention; the alternatives, including doing nothing; the risk(s) and benefit(s) of the alternative treatment(s) or procedure(s); and the risk(s) and benefit(s) of doing nothing. The patient was provided information about the general risks and possible complications associated with the procedure. These may include, but are not limited to: failure to achieve desired goals, infection, bleeding, organ or nerve damage, allergic reactions, paralysis, and death. In addition, the patient was informed of those risks and complications associated to Spine-related procedures, such as failure to decrease pain; infection (i.e.: Meningitis, epidural or intraspinal abscess); bleeding (i.e.: epidural hematoma, subarachnoid hemorrhage, or any other type of intraspinal or peri-dural bleeding); organ or nerve damage (i.e.: Any type of peripheral nerve, nerve root, or spinal cord injury) with subsequent damage to sensory, motor, and/or autonomic systems, resulting in permanent pain,  numbness, and/or weakness of one or several areas of the body; allergic reactions; (i.e.: anaphylactic reaction); and/or death. Furthermore, the patient was informed of those risks and complications associated with the medications. These include, but are not limited to: allergic reactions (i.e.: anaphylactic or anaphylactoid reaction(s)); adrenal axis suppression; blood sugar elevation that in diabetics may result in ketoacidosis or comma; water retention that in patients with history of congestive heart failure may result in shortness of breath, pulmonary edema, and decompensation with resultant heart failure; weight gain; swelling or edema; medication-induced neural toxicity; particulate matter embolism and blood vessel occlusion with resultant organ, and/or nervous system infarction; and/or aseptic necrosis of one or more joints. Finally, the patient was informed that Medicine is not an exact science; therefore, there is also the possibility of unforeseen or unpredictable risks and/or possible complications that may result in a catastrophic outcome. The patient indicated having understood very clearly. We have given the patient no guarantees and we have made no promises. Enough time was given to the patient to ask questions, all of which were answered to the patient's satisfaction. Mr. Nave has indicated that he wanted to continue with the procedure. Attestation: I, the ordering provider, attest that I have discussed with the patient the benefits, risks, side-effects, alternatives, likelihood of achieving goals, and potential problems during recovery for the procedure that I have provided informed consent. Date  Time: 11/30/2021  9:28 AM  Pre-Procedure Preparation:  Monitoring: As per clinic protocol. Respiration, ETCO2, SpO2, BP, heart rate and rhythm monitor placed and checked for adequate function Safety Precautions: Patient was assessed for positional comfort and pressure points before starting  the procedure. Time-out:  I initiated and conducted the "Time-out" before starting the procedure, as per protocol. The patient was asked to participate by confirming the accuracy of the "Time Out" information. Verification of the correct person, site, and procedure were performed and confirmed by me, the nursing staff, and the patient. "Time-out" conducted as per Joint Commission's Universal Protocol (UP.01.01.01). Time: 1015  Description/Narrative of Procedure:          Target: Epidural space via interlaminar opening, initially targeting the lower laminar border of the superior vertebral body. Region: Lumbar Approach: Percutaneous paravertebral  Rationale (medical necessity): procedure needed and proper for the diagnosis and/or treatment of the patient's medical symptoms and needs. Procedural Technique Safety Precautions: Aspiration looking for blood return was conducted prior to all injections. At no point did we inject any substances, as a needle was being advanced. No attempts were made at seeking any paresthesias. Safe injection practices and needle disposal techniques used. Medications properly checked for expiration dates. SDV (single dose vial) medications used. Description of the Procedure: Protocol guidelines were followed. The procedure needle was introduced through the skin, ipsilateral to the reported pain, and advanced to the target area. Bone was contacted and the needle walked caudad, until the lamina was cleared. The epidural space was identified using "loss-of-resistance technique" with 2-3 ml of PF-NaCl (0.9% NSS), in a 5cc LOR glass syringe.  Vitals:   11/30/21 0925 11/30/21 1015 11/30/21 1020 11/30/21 1030  BP: 107/64 (!) 143/104 (!) 158/99 (!) 140/98  Pulse: 67     Resp: '18 16 18 16  '$ Temp: (!) 97.2 F (36.2 C)     TempSrc: Temporal     SpO2: 95% 95% 95% 97%  Weight: (!) 320 lb (145.2 kg)     Height: 6' (1.829 m)       Start Time: 1015 hrs. End Time: 1020  hrs.  Imaging Guidance (Spinal):          Type of Imaging Technique: Fluoroscopy Guidance (Spinal) Indication(s): Assistance in needle guidance and placement for procedures requiring needle placement in or near specific anatomical locations not easily accessible without such assistance. Exposure Time: Please see nurses notes. Contrast: Before injecting any contrast, we confirmed that the patient did not have an allergy to iodine, shellfish, or radiological contrast. Once satisfactory needle placement was completed at the desired level, radiological contrast was injected. Contrast injected under live fluoroscopy. No contrast complications. See chart for type and volume of contrast used. Fluoroscopic Guidance: I was personally present during the use of fluoroscopy. "Tunnel Vision Technique" used to obtain the best possible view of the target area. Parallax error corrected before commencing the procedure. "Direction-depth-direction" technique used to introduce the needle under continuous pulsed fluoroscopy. Once target was reached, antero-posterior, oblique, and lateral fluoroscopic projection used confirm needle placement in all planes. Images permanently stored in EMR. Interpretation: I personally interpreted the imaging intraoperatively. Adequate needle placement confirmed in multiple planes. Appropriate spread of contrast into desired area was observed. No evidence of afferent or efferent intravascular uptake. No intrathecal or subarachnoid spread observed. Permanent images saved into the patient's record.  Antibiotic Prophylaxis:   Anti-infectives (From admission, onward)    None      Indication(s): None identified  Post-operative Assessment:  Post-procedure Vital Signs:  Pulse/HCG Rate: 6774 Temp: (!) 97.2 F (36.2 C) Resp: 16 BP: (!) 140/98 SpO2: 97 %  EBL: None  Complications: No immediate post-treatment complications observed by team, or reported by patient.  Note: The patient  tolerated the entire procedure well.  A repeat set of vitals were taken after the procedure and the patient was kept under observation following institutional policy, for this type of procedure. Post-procedural neurological assessment was performed, showing return to baseline, prior to discharge. The patient was provided with post-procedure discharge instructions, including a section on how to identify potential problems. Should any problems arise concerning this procedure, the patient was given instructions to immediately contact us, at any time, without hesitation. In any case, we plan to contact the patient by telephone for a follow-up status report regarding this interventional procedure.  Comments:  No additional relevant information.  Plan of Care  Orders:  Orders Placed This Encounter  Procedures   Lumbar Epidural Injection    Scheduling Instructions:     Procedure: Interlaminar LESI L4-5     Laterality: Right     Sedation: Patient's choice     Timeframe: Today    Order Specific Question:   Where will this procedure be performed?    Answer:   [facility]   DG PAIN [facility] C-ARM 1-60 MIN NO REPORT    Intraoperative interpretation by procedural physician at [facility].    Standing Status:   Standing    Number of Occurrences:   1    Order Specific Question:   Reason for exam:    Answer:   Assistance in needle guidance and placement for procedures requiring needle placement in or near specific anatomical locations not easily accessible without such assistance.   Informed Consent Details: Physician/Practitioner Attestation; Transcribe to consent form and obtain patient signature    Note: Always confirm laterality of pain with Mr. Mattioli, before procedure. Transcribe to consent form and obtain patient signature.    Order Specific Question:   Physician/Practitioner attestation of informed consent for procedure/surgical case    Answer:   I, the physician/practitioner,  attest that I have discussed with the patient the benefits, risks, side effects, alternatives, likelihood of achieving goals and potential problems during recovery for the procedure that I have provided informed consent.    Order Specific Question:   Procedure    Answer:   Lumbar epidural steroid injection under fluoroscopic guidance    Order Specific Question:   Physician/Practitioner performing the procedure    Answer:   Pritesh Sobecki A. Dossie Arbour, MD    Order Specific Question:   Indication/Reason    Answer:   Low back and/or lower extremity pain secondary to lumbar radiculitis   Provide equipment / supplies at bedside    "Epidural Tray" (Disposable  single use) Catheter: NOT required    Standing Status:   Standing    Number of Occurrences:   1    Order Specific Question:   Specify    Answer:   Epidural Tray   Chronic Opioid Analgesic:  Oxycodone/APAP 10/325 (# 60/month), 1 tab p.o. twice daily (last filled on 02/03/2021) Needs to loose an average of 2.75 lbs/mo to be considered for opioid pharmacotherapy.  MME/day: 30 mg/day   Medications ordered for procedure: Meds ordered this encounter  Medications   iohexol (OMNIPAQUE) 180 MG/ML injection 10 mL    Must be Myelogram-compatible. If not available, you may substitute with a water-soluble, non-ionic, hypoallergenic, myelogram-compatible radiological contrast medium.   lidocaine (XYLOCAINE) 2 % (with pres) injection 400 mg   pentafluoroprop-tetrafluoroeth (GEBAUERS) aerosol   lactated ringers infusion   midazolam (VERSED) 5 MG/5ML injection 0.5-2 mg    Make sure Flumazenil is available in the pyxis when using this medication. If oversedation occurs, administer  0.2 mg IV over 15 sec. If after 45 sec no response, administer 0.2 mg again over 1 min; may repeat at 1 min intervals; not to exceed 4 doses (1 mg)   sodium chloride flush (NS) 0.9 % injection 2 mL   ropivacaine (PF) 2 mg/mL (0.2%) (NAROPIN) injection 2 mL   triamcinolone acetonide  (KENALOG-40) injection 40 mg   Medications administered: We administered iohexol, lidocaine, pentafluoroprop-tetrafluoroeth, midazolam, sodium chloride flush, ropivacaine (PF) 2 mg/mL (0.2%), and triamcinolone acetonide.  See the medical record for exact dosing, route, and time of administration.  Follow-up plan:   Return in about 2 weeks (around 12/14/2021) for Proc-day (T,Th), (VV), (PPE).        Interventional Therapies  Risk  Complexity Considerations:   Estimated body mass index is 45.98 kg/m down from 50.97 kg/m as calculated from the following:   Height as of this encounter: 6' (1.829 m).   Weight as of this encounter: 339 lbs down from 375 lb 12.8 oz (170.5 kg). Our goal is to bring his BMI down to 30 kg/m (220 lbs for his height). He is supposed to lose 2.75 lbs/month. WNL   Planned  Pending:   Diagnostic/therapeutic right L4-5 LESI #2   Notes will be requested from the [place] [facility] [place].   Under consideration:   Diagnostic/therapeutic left IA AC + glenohumeral joint inj. #1  Diagnostic/therapeutic left suprascapular NB #1  Diagnostic/therapeutic bilateral IA hip inj. #1  Diagnostic/therapeutic bilateral IA steroid knee inj. #1  Therapeutic bilateral IA viscosupplementation knee inj. #1  Diagnostic/therapeutic bilateral lumbar facet MBB #1    Completed:   Diagnostic/therapeutic right L3-4 LESI x1 (11/04/21) (100/100/25/25) (100% of LBP)  Diagnostic/therapeutic right L4-5 LESI x1 (06/21/2021) by Dr. Gillis Santa (100/20/0/0)  Toradol/Norflex IM 60/60 mg (03/29/2021)  Referral to orthopedic surgery for evaluation of knees and hip (03/29/2021)  Referral to medical weight management (02/15/2021)  Referral to bariatric surgery (02/15/2021)    Completed by other providers:   [facility]  EMG/PNCV (LE) by Gurney Maxin, MD [facility]) (03/16/2021) (chronic, severe sensorimotor polyneuropathy)   Therapeutic   Palliative (PRN) options:   None established    Recent Visits Date Type Provider Dept  11/15/21 Office Visit Milinda Pointer, MD [facility]  11/04/21 Procedure visit Milinda Pointer, MD [facility]  10/20/21 Office Visit Milinda Pointer, MD [facility]  Showing recent visits within past 90 days and meeting all other requirements Today's Visits Date Type Provider Dept  11/30/21 Procedure visit Milinda Pointer, MD [facility]  Showing today's visits and meeting all other requirements Future Appointments Date Type Provider Dept  12/14/21 Appointment Milinda Pointer, [facility]  02/16/22 Appointment Milinda Pointer, MD [facility]  Showing future appointments within next 90 days and meeting all other requirements  Disposition: Discharge home  Discharge (Date  Time): 11/30/2021; 1034 hrs.   Primary Care Physician: James Hauser, DO Location: [facility] Note by: James Cola, MD Date: 11/30/2021; Time: 10:56 AM  Disclaimer:  Medicine is not an exact science. The only guarantee in medicine is that nothing is guaranteed. It is important to note that the decision to proceed with this intervention was based on the information collected from the patient. The Data and conclusions were drawn from the patient's questionnaire, the interview, and the physical examination. Because the information was provided in large part by the patient, it cannot be guaranteed that it  has not been purposely or unconsciously manipulated. Every effort has been made to obtain as much relevant data as possible for this evaluation. It is important to note that the conclusions that lead to this procedure are derived in large part from the available data. Always take into account that the treatment will also be dependent on availability of resources and existing treatment guidelines, considered by  other Pain Management Practitioners as being common knowledge and practice, at the time of the intervention. For Medico-Legal purposes, it is also important to point out that variation in procedural techniques and pharmacological choices are the acceptable norm. The indications, contraindications, technique, and results of the above procedure should only be interpreted and judged by a Board-Certified Interventional Pain Specialist with extensive familiarity and expertise in the same exact procedure and technique.

## 2021-12-01 ENCOUNTER — Telehealth: Payer: Self-pay

## 2021-12-01 NOTE — Telephone Encounter (Signed)
Post procedure phone call.  LM 

## 2021-12-11 NOTE — Progress Notes (Signed)
Patient: James [name]  Service Category: E/M  Provider: Gaspar Cola, MD  DOB: 07-28-1960  DOS: 12/14/2021  Location: Office  MRN: [id number]  Setting: Ambulatory outpatient  Referring Provider: Nobie [name] *  Type: Established Patient  Specialty: Interventional Pain Management  PCP: James Hauser, DO  Location: Remote location  Delivery: TeleHealth     Virtual Encounter - Pain Management PROVIDER NOTE: Information contained herein reflects review and annotations entered in association with encounter. Interpretation of such information and data should be left to medically-trained personnel. Information provided to patient can be located elsewhere in the medical record under "Patient Instructions". Document created using STT-dictation technology, any transcriptional errors that may result from process are unintentional.    Contact & Pharmacy Preferred: [phone] Home: [phone] (home) Mobile: [phone] (mobile) E-mail: [email or web address] .[facility] [address]), [place]) [place] [address] Phone: [phone] Fax: [place], [place] [address] Phone: [phone] Fax: [phone]  [id number] [place] [address] [id number]: [phone]: [phone]  Pre-screening  [name] "in-person" vs "virtual" encounter. He indicated preferring virtual for this encounter.   Reason COVID-19*  Social distancing based on CDC and AMA recommendations.   I contacted James Birchwoodon 12/14/2021 via telephone.      I clearly identified myself as FGaspar Cola MD. I verified that I was speaking with the correct person using two identifiers (Name: James [name] and date of birth:  [birth year].  Consent I sought verbal advanced consent from James [name] virtual visit interactions. I informed Mr. SHighleyof possible security and privacy concerns, risks, and limitations associated with providing "not-in-person" medical evaluation and management services. I also informed Mr. SMoscoof the availability of "in-person" appointments. Finally, I informed him that there would be a charge for the virtual visit and that he could be  personally, fully or partially, financially responsible for it. Mr. SHodgkinexpressed understanding and agreed to proceed.   Historic Elements   [name] a 61y.o. year old, male patient evaluated today after our last contact on 11/30/2021. [name] has a past medical history of Arthritis, Diabetes mellitus without complication ([place], Hyperlipidemia, and Hypertension. He also  has a past surgical history that includes Colonoscopy with propofol; Appendectomy; and Colonoscopy with propofol (N/A, 04/23/2021). Mr. SGildersleevehas a current medication list which includes the following prescription(s): albuterol, atorvastatin, hydrochlorothiazide, losartan, metformin, naloxone, oxycodone hcl, [START ON 12/25/2021] oxycodone hcl, [START ON 01/24/2022] oxycodone hcl, ozempic (1 mg/dose), and pregabalin. He  reports that he has been smoking cigarettes. He has a 2.50 pack-year smoking history. He has never used smokeless tobacco. He reports that he does not drink alcohol and does not use drugs. Mr. SLadnierhas No Known Allergies.   HPI  Today, he is being contacted for a post-procedure assessment.  The patient indicates having attained a 100% relief of the pain for the duration of the local anesthetic which in fact lasted for an additional 4 days.  After that, the pain started coming back but at this point it is still an ongoing 80% improvement from what he had before.  He refers that the only area that is really hurting is the anterior  thigh on the right leg.  Reviewing his 05/21/2021  Lumbar MRI we see that he has central spinal stenosis at the L2-3 and L3-4 level and this is likely to be responsible for the pain that he is describing.  For this reason, I will be scheduling him to return for a right-sided L2-3 LESI.  This will be his third epidural.  The plan was shared with the patient who understood and accepted.  Post-procedure evaluation   Type: Lumbar epidural steroid injection (LESI) (interlaminar) #2    Laterality: Right   Level:  L4-5 Level.  Imaging: Fluoroscopic guidance         Anesthesia: Local anesthesia (1-2% Lidocaine) Anxiolysis: None                 Sedation: No Sedation                       DOS: 11/30/2021  Performed by: James Cola, MD  Purpose: Diagnostic/Therapeutic Indications: Lumbar radicular pain of intraspinal etiology of more than 4 weeks that has failed to respond to conservative therapy and is severe enough to impact quality of life or function. 1. Lumbar nerve root impingement (Right: L4)   2. 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 Lumbar lateral recess stenosis (Right: L3-4) (Bilateral: L4-5)   4. Chronic lower extremity pain (1ry area of Pain) (Bilateral)   5. Chronic low back pain (4th area of Pain) (Bilateral) w/ sciatica (Bilateral)   6. DDD (degenerative disc disease), lumbar   7. 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 Herniated nucleus pulposus, L3-4 (Right)   9. Lumbar central spinal stenosis, 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   10. Abnormal MRI, lumbar spine (05/21/2021)   11. Abnormal EMG (electromyogram) (03/16/2021)   12. Class 3 obesity with alveolar hypoventilation, serious comorbidity, and body mass index (BMI) of 45.0 to 49.9 in adult (James [name])   13. Type 2 diabetes mellitus with morbid obesity (James [name])   14. Elevated C-reactive protein (CRP)   15. Elevated sed rate    NAS-11 Pain score:   Pre-procedure: 10-Worst pain ever/10    Post-procedure: 7 /10      Effectiveness:  Initial hour after procedure:  100%. Subsequent 4-6 hours post-procedure:   100%. Analgesia past initial 6 hours:   100% x 4 days.  80% ongoing relief. Ongoing improvement:  Analgesic: According to the patient he currently is experiencing an ongoing 80% improvement of his symptoms.  He refers that the only area where he still has some discomfort, even though it is much better, he is in the area of the right anterior thigh. Function: Mr. Statzer reports improvement in function ROM: Mr. Blossom reports improvement in ROM  Pharmacotherapy Assessment   Opioid Analgesic: Oxycodone/APAP 10/325 (# 60/month), 1 tab p.o. twice daily (last filled on 02/03/2021) Needs to loose an average of 2.75 lbs/mo to be considered for opioid pharmacotherapy.  MME/day: 30 mg/day   Monitoring: [place] PMP: PDMP reviewed during this encounter.       Pharmacotherapy: No side-effects or adverse reactions reported. Compliance: No problems identified. Effectiveness: Clinically acceptable. Plan: Refer to "POC". UDS:  Summary  Date Value Ref Range Status  02/15/2021 Note  Final    Comment:    ==================================================================== Compliance Drug Analysis, Ur ==================================================================== Test                             Result       Flag  Units  Drug Present and Declared for Prescription Verification   Oxycodone                      3020         EXPECTED   ng/mg creat   Oxymorphone                    1094         EXPECTED   ng/mg creat   Noroxycodone                   3969         EXPECTED   ng/mg creat   Noroxymorphone                 265          EXPECTED   ng/mg creat    Sources of oxycodone are scheduled prescription medications.    Oxymorphone, noroxycodone, and noroxymorphone are expected    metabolites of oxycodone. Oxymorphone is also available as a    scheduled prescription  medication.    Pregabalin                     PRESENT      EXPECTED   Acetaminophen                  PRESENT      EXPECTED ==================================================================== Test                      Result    Flag   Units      Ref Range   Creatinine              172              mg/dL      >=20 ==================================================================== Declared Medications:  The flagging and interpretation on this report are based on the  following declared medications.  Unexpected results may arise from  inaccuracies in the declared medications.   **Note: The testing scope of this panel includes these medications:   Oxycodone (Percocet)  Pregabalin (Lyrica)   **Note: The testing scope of this panel does not include small to  moderate amounts of these reported medications:   Acetaminophen (Percocet)   **Note: The testing scope of this panel does not include the  following reported medications:   Atorvastatin (Lipitor)  Hydrochlorothiazide (Hydrodiuril)  Insulin Tyler Aas)  Losartan (Cozaar)  Metformin (Glucophage)  Semaglutide (Ozempic) ==================================================================== For clinical consultation, please call [phone]. ====================================================================    No results found for: "CBDTHCR", "D8THCCBX", "D9THCCBX"   Laboratory Chemistry Profile   Renal Lab Results  Component Value Date   BUN 12 06/25/2021   CREATININE 0.67 (L) 06/25/2021   BCR 18 06/25/2021   GFRNONAA >60 10/30/2020    Hepatic Lab Results  Component Value Date   AST 10 06/25/2021   ALT 8 (L) 06/25/2021   ALBUMIN 4.1 02/15/2021   ALKPHOS 116 02/15/2021    Electrolytes Lab Results  Component Value Date   NA 142 06/25/2021   K 4.1 06/25/2021   CL 100 06/25/2021   CALCIUM 9.0 06/25/2021   MG 1.8 02/15/2021    Bone Lab Results  Component Value Date   25OHVITD1 13 (L) 02/15/2021   25OHVITD2  <1.0 02/15/2021   25OHVITD3 12 02/15/2021    Inflammation (CRP: Acute Phase) (ESR: Chronic Phase) Lab Results  Component Value Date   CRP  29 (H) 02/15/2021   ESRSEDRATE 39 (H) 02/15/2021         Note: Above Lab results reviewed.  Imaging  DG PAIN CLINIC C-ARM 1-60 MIN NO REPORT Fluoro was used, but no Radiologist interpretation will be provided.  Please refer to "NOTES" tab for provider progress note.  Assessment  The primary encounter diagnosis was Chronic lower extremity pain (1ry area of Pain) (Bilateral). Diagnoses of Chronic low back pain (4th area of Pain) (Bilateral) w/ sciatica (Bilateral), Chronic pain syndrome, Lumbar central spinal stenosis, 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, Epidural lipomatosis (L1-2 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), DDD (degenerative disc disease), lumbar, Chronic thigh pain (Right), and Herniated nucleus pulposus, L3-4 (Right) were also pertinent to this visit.  Plan of Care  Problem-specific:  No problem-specific Assessment & Plan notes found for this encounter.  Mr. Hari Casaus has a current medication list which includes the following long-term medication(s): albuterol, atorvastatin, hydrochlorothiazide, losartan, metformin, oxycodone hcl, [START ON 12/25/2021] oxycodone hcl, [START ON 01/24/2022] oxycodone hcl, and pregabalin.  Pharmacotherapy (Medications Ordered): No orders of the defined types were placed in this encounter.  Orders:  Orders Placed This Encounter  Procedures   Lumbar Epidural Injection    Standing Status:   Future    Standing Expiration Date:   03/16/2022    Scheduling Instructions:     Procedure: Interlaminar Lumbar Epidural Steroid injection (LESI)  L2-3     Laterality: Right-sided     Sedation: Patient's choice.     Timeframe: ASAA    Order Specific Question:   Where will this procedure be performed?    Answer:   [facility]   Follow-up plan:   Return for [facility]): (R) L2-3 LEESI #3.     Interventional Therapies  Risk   Complexity Considerations:   Estimated body mass index is 45.98 kg/m down from 50.97 kg/m as calculated from the following:   Height as of this encounter: 6' (1.829 m).   Weight as of this encounter: 339 lbs down from 375 lb 12.8 oz (170.5 kg). Our goal is to bring his BMI down to 30 kg/m (220 lbs for his height). He is supposed to lose 2.75 lbs/month. WNL   Planned  Pending:   Diagnostic/therapeutic right L 2-3 LESI #3   Notes will be requested from the [place] [facility] [place].   Under consideration:   Diagnostic/therapeutic left IA AC + glenohumeral joint inj. #1  Diagnostic/therapeutic left suprascapular NB #1  Diagnostic/therapeutic bilateral IA hip inj. #1  Diagnostic/therapeutic bilateral IA steroid knee inj. #1  Therapeutic bilateral IA viscosupplementation knee inj. #1  Diagnostic/therapeutic bilateral lumbar facet MBB #1    Completed:   Diagnostic/therapeutic right L3-4 LESI x1 (11/04/21) (100/100/25/25) (100% of LBP)  Diagnostic/therapeutic right L4-5 LESI x2 (11/30/2021) (100/100/100/80)  Toradol/Norflex IM 60/60 mg (03/29/2021)  Referral to orthopedic surgery for evaluation of knees and hip (03/29/2021)  Referral to medical weight management (02/15/2021)  Referral to bariatric surgery (02/15/2021)    Completed by other providers:   [facility]  EMG/PNCV (LE) by Gurney Maxin, MD [facility] neurology) (03/16/2021) (chronic, severe sensorimotor polyneuropathy)   Therapeutic  Palliative (PRN) options:   None established     Recent Visits Date Type Provider Dept  11/30/21 Procedure visit Milinda Pointer, MD [facility]  11/15/21 Office Visit Milinda Pointer, MD [facility]  11/04/21 Procedure visit Milinda Pointer, MD [facility]  10/20/21 Office Visit Milinda Pointer, MD [facility]  Showing recent visits within past  90 days and meeting all other requirements Today's  Visits Date Type Provider Dept  12/14/21 Office Visit Milinda Pointer, MD [facility]  Showing today's visits and meeting all other requirements Future Appointments Date Type Provider Dept  02/16/22 Appointment Milinda Pointer, MD [facility]  Showing future appointments within next 90 days and meeting all other requirements  I discussed the assessment and treatment plan with the patient. The patient was provided an opportunity to ask questions and all were answered. The patient agreed with the plan and demonstrated an understanding of the instructions.  Patient advised to call back or seek an in-person evaluation if the symptoms or condition worsens.  Duration of encounter: 22 minutes.  Note by: James Cola, MD Date: 12/14/2021; Time: 12:37 PM

## 2021-12-14 ENCOUNTER — Ambulatory Visit: Payer: Medicare HMO | Attending: Pain Medicine | Admitting: Pain Medicine

## 2021-12-14 DIAGNOSIS — D1779 Benign lipomatous neoplasm of other sites: Secondary | ICD-10-CM | POA: Diagnosis not present

## 2021-12-14 DIAGNOSIS — M48061 Spinal stenosis, lumbar region without neurogenic claudication: Secondary | ICD-10-CM

## 2021-12-14 DIAGNOSIS — M79604 Pain in right leg: Secondary | ICD-10-CM | POA: Diagnosis not present

## 2021-12-14 DIAGNOSIS — M79605 Pain in left leg: Secondary | ICD-10-CM | POA: Diagnosis not present

## 2021-12-14 DIAGNOSIS — G894 Chronic pain syndrome: Secondary | ICD-10-CM | POA: Diagnosis not present

## 2021-12-14 DIAGNOSIS — M5442 Lumbago with sciatica, left side: Secondary | ICD-10-CM | POA: Diagnosis not present

## 2021-12-14 DIAGNOSIS — M5136 Other intervertebral disc degeneration, lumbar region: Secondary | ICD-10-CM | POA: Diagnosis not present

## 2021-12-14 DIAGNOSIS — M79651 Pain in right thigh: Secondary | ICD-10-CM | POA: Insufficient documentation

## 2021-12-14 DIAGNOSIS — M5441 Lumbago with sciatica, right side: Secondary | ICD-10-CM

## 2021-12-14 DIAGNOSIS — G8929 Other chronic pain: Secondary | ICD-10-CM

## 2021-12-14 DIAGNOSIS — M5126 Other intervertebral disc displacement, lumbar region: Secondary | ICD-10-CM

## 2021-12-14 NOTE — Patient Instructions (Signed)
______________________________________________________________________  Preparing for your procedure  During your procedure appointment there will be: No Prescription Refills. No disability issues to discussed. No medication changes or discussions.  Instructions: Food intake: Avoid eating anything solid for at least 8 hours prior to your procedure. Clear liquid intake: You may take clear liquids such as water up to 2 hours prior to your procedure. (No carbonated drinks. No soda.) Transportation: Unless otherwise stated by your physician, bring a driver. Morning Medicines: Except for blood thinners, take all of your other morning medications with a sip of water. Make sure to take your heart and blood pressure medicines. If your blood pressure's lower number is above 100, the case will be rescheduled. Blood thinners: If you take a blood thinner, but were not instructed to stop it, call our office [phone] and ask to talk to a nurse. Not stopping a blood thinner prior to certain procedures could lead to serious complications. Diabetics on insulin: Notify the staff so that you can be scheduled 1st case in the morning. If your diabetes requires high dose insulin, take only  of your normal insulin dose the morning of the procedure and notify the staff that you have done so. Preventing infections: Shower with an antibacterial soap the morning of your procedure.  Build-up your immune system: Take 1000 mg of Vitamin C with every meal (3 times a day) the day prior to your procedure. Antibiotics: Inform the nursing staff if you are taking any antibiotics or if you have any conditions that may require antibiotics prior to procedures. (Example: recent joint implants)   Pregnancy: If you are pregnant make sure to notify the nursing staff. Not doing so may result in injury to the fetus, including death.  Sickness: If you have a cold, fever, or any active infections, call and cancel or reschedule your  procedure. Receiving steroids while having an infection may result in complications. Arrival: You must be in the facility at least 30 minutes prior to your scheduled procedure. Tardiness: Your scheduled time is also the cutoff time. If you do not arrive at least 15 minutes prior to your procedure, you will be rescheduled.  Children: Do not bring any children with you. Make arrangements to keep them home. Dress appropriately: There is always a possibility that your clothing may get soiled. Avoid long dresses. Valuables: Do not bring any jewelry or valuables.  Reasons to call and reschedule or cancel your procedure: (Following these recommendations will minimize the risk of a serious complication.) Surgeries: Avoid having procedures within 2 weeks of any surgery. (Avoid for 2 weeks before or after any surgery). Flu Shots: Avoid having procedures within 2 weeks of a flu shots or . (Avoid for 2 weeks before or after immunizations). Barium: Avoid having a procedure within 7-10 days after having had a radiological study involving the use of radiological contrast. (Myelograms, Barium swallow or enema study). Heart attacks: Avoid any elective procedures or surgeries for the initial 6 months after a "Myocardial Infarction" (Heart Attack). Blood thinners: It is imperative that you stop these medications before procedures. Let us know if you if you take any blood thinner.  Infection: Avoid procedures during or within two weeks of an infection (including chest colds or gastrointestinal problems). Symptoms associated with infections include: Localized redness, fever, chills, night sweats or profuse sweating, burning sensation when voiding, cough, congestion, stuffiness, runny nose, sore throat, diarrhea, nausea, vomiting, cold or Flu symptoms, recent or current infections. It is specially important if the infection is   over the area that we intend to treat. Heart and lung problems: Symptoms that may suggest an  active cardiopulmonary problem include: cough, chest pain, breathing difficulties or shortness of breath, dizziness, ankle swelling, uncontrolled high or unusually low blood pressure, and/or palpitations. If you are experiencing any of these symptoms, cancel your procedure and contact your primary care physician for an evaluation.  Remember:  Regular Business hours are:  Monday to Thursday 8:00 AM to 4:00 PM  Provider's Schedule: Treven Holtman, MD:  Procedure days: Tuesday and Thursday 7:30 AM to 4:00 PM  Bilal Lateef, MD:  Procedure days: Monday and Wednesday 7:30 AM to 4:00 PM  ______________________________________________________________________    ____________________________________________________________________________________________  General Risks and Possible Complications  Patient Responsibilities: It is important that you read this as it is part of your informed consent. It is our duty to inform you of the risks and possible complications associated with treatments offered to you. It is your responsibility as a patient to read this and to ask questions about anything that is not clear or that you believe was not covered in this document.  Patient's Rights: You have the right to refuse treatment. You also have the right to change your mind, even after initially having agreed to have the treatment done. However, under this last option, if you wait until the last second to change your mind, you may be charged for the materials used up to that point.  Introduction: Medicine is not an exact science. Everything in Medicine, including the lack of treatment(s), carries the potential for danger, harm, or loss (which is by definition: Risk). In Medicine, a complication is a secondary problem, condition, or disease that can aggravate an already existing one. All treatments carry the risk of possible complications. The fact that a side effects or complications occurs, does not imply  that the treatment was conducted incorrectly. It must be clearly understood that these can happen even when everything is done following the highest safety standards.  No treatment: You can choose not to proceed with the proposed treatment alternative. The "PRO(s)" would include: avoiding the risk of complications associated with the therapy. The "CON(s)" would include: not getting any of the treatment benefits. These benefits fall under one of three categories: diagnostic; therapeutic; and/or palliative. Diagnostic benefits include: getting information which can ultimately lead to improvement of the disease or symptom(s). Therapeutic benefits are those associated with the successful treatment of the disease. Finally, palliative benefits are those related to the decrease of the primary symptoms, without necessarily curing the condition (example: decreasing the pain from a flare-up of a chronic condition, such as incurable terminal cancer).  General Risks and Complications: These are associated to most interventional treatments. They can occur alone, or in combination. They fall under one of the following six (6) categories: no benefit or worsening of symptoms; bleeding; infection; nerve damage; allergic reactions; and/or death. No benefits or worsening of symptoms: In Medicine there are no guarantees, only probabilities. No healthcare provider can ever guarantee that a medical treatment will work, they can only state the probability that it may. Furthermore, there is always the possibility that the condition may worsen, either directly, or indirectly, as a consequence of the treatment. Bleeding: This is more common if the patient is taking a blood thinner, either prescription or over the counter (example: Goody Powders, Fish oil, Aspirin, Garlic, etc.), or if suffering a condition associated with impaired coagulation (example: Hemophilia, cirrhosis of the liver, low platelet counts, etc.). However, even if   you  do not have one on these, it can still happen. If you have any of these conditions, or take one of these drugs, make sure to notify your treating physician. Infection: This is more common in patients with a compromised immune system, either due to disease (example: diabetes, cancer, human immunodeficiency virus [HIV], etc.), or due to medications or treatments (example: therapies used to treat cancer and rheumatological diseases). However, even if you do not have one on these, it can still happen. If you have any of these conditions, or take one of these drugs, make sure to notify your treating physician. Nerve Damage: This is more common when the treatment is an invasive one, but it can also happen with the use of medications, such as those used in the treatment of cancer. The damage can occur to small secondary nerves, or to large primary ones, such as those in the spinal cord and brain. This damage may be temporary or permanent and it may lead to impairments that can range from temporary numbness to permanent paralysis and/or brain death. Allergic Reactions: Any time a substance or material comes in contact with our body, there is the possibility of an allergic reaction. These can range from a mild skin rash (contact dermatitis) to a severe systemic reaction (anaphylactic reaction), which can result in death. Death: In general, any medical intervention can result in death, most of the time due to an unforeseen complication. ____________________________________________________________________________________________    

## 2021-12-23 ENCOUNTER — Encounter: Payer: Self-pay | Admitting: Pain Medicine

## 2021-12-23 ENCOUNTER — Ambulatory Visit: Payer: Medicare HMO | Attending: Pain Medicine | Admitting: Pain Medicine

## 2021-12-23 ENCOUNTER — Ambulatory Visit
Admission: RE | Admit: 2021-12-23 | Discharge: 2021-12-23 | Disposition: A | Discharge status: Home / Self Care | Payer: Medicare HMO | Source: Ambulatory Visit | Attending: Pain Medicine | Admitting: Pain Medicine

## 2021-12-23 VITALS — BP 150/90 | HR 94 | Temp 97.0°F | Resp 19 | Ht 72.0 in | Wt 323.0 lb

## 2021-12-23 DIAGNOSIS — M79604 Pain in right leg: Secondary | ICD-10-CM | POA: Insufficient documentation

## 2021-12-23 DIAGNOSIS — D1779 Benign lipomatous neoplasm of other sites: Secondary | ICD-10-CM | POA: Diagnosis not present

## 2021-12-23 DIAGNOSIS — M5136 Other intervertebral disc degeneration, lumbar region: Secondary | ICD-10-CM | POA: Insufficient documentation

## 2021-12-23 DIAGNOSIS — M5417 Radiculopathy, lumbosacral region: Secondary | ICD-10-CM | POA: Diagnosis present

## 2021-12-23 DIAGNOSIS — G8929 Other chronic pain: Secondary | ICD-10-CM | POA: Diagnosis not present

## 2021-12-23 DIAGNOSIS — E662 Morbid (severe) obesity with alveolar hypoventilation: Secondary | ICD-10-CM | POA: Diagnosis present

## 2021-12-23 DIAGNOSIS — M5126 Other intervertebral disc displacement, lumbar region: Secondary | ICD-10-CM | POA: Insufficient documentation

## 2021-12-23 DIAGNOSIS — M5416 Radiculopathy, lumbar region: Secondary | ICD-10-CM | POA: Diagnosis not present

## 2021-12-23 DIAGNOSIS — Z6841 Body Mass Index (BMI) 40.0 and over, adult: Secondary | ICD-10-CM | POA: Insufficient documentation

## 2021-12-23 DIAGNOSIS — M5442 Lumbago with sciatica, left side: Secondary | ICD-10-CM | POA: Diagnosis not present

## 2021-12-23 DIAGNOSIS — M79605 Pain in left leg: Secondary | ICD-10-CM | POA: Diagnosis present

## 2021-12-23 DIAGNOSIS — M79651 Pain in right thigh: Secondary | ICD-10-CM | POA: Diagnosis present

## 2021-12-23 DIAGNOSIS — M48061 Spinal stenosis, lumbar region without neurogenic claudication: Secondary | ICD-10-CM | POA: Insufficient documentation

## 2021-12-23 DIAGNOSIS — R6889 Other general symptoms and signs: Secondary | ICD-10-CM | POA: Diagnosis not present

## 2021-12-23 DIAGNOSIS — M5441 Lumbago with sciatica, right side: Secondary | ICD-10-CM | POA: Diagnosis not present

## 2021-12-23 MED ORDER — ROPIVACAINE HCL 2 MG/ML IJ SOLN
2.0000 mL | Freq: Once | INTRAMUSCULAR | Status: AC
  Administered 2021-12-23: 2 mL via EPIDURAL
  Filled 2021-12-23: qty 20

## 2021-12-23 MED ORDER — MIDAZOLAM HCL 2 MG/2ML IJ SOLN
0.5000 mg | Freq: Once | INTRAMUSCULAR | Status: AC
  Administered 2021-12-23: 2 mg via INTRAVENOUS
  Filled 2021-12-23: qty 2

## 2021-12-23 MED ORDER — SODIUM CHLORIDE 0.9% FLUSH
2.0000 mL | Freq: Once | INTRAVENOUS | Status: AC
  Administered 2021-12-23: 2 mL

## 2021-12-23 MED ORDER — LIDOCAINE HCL 2 % IJ SOLN
20.0000 mL | Freq: Once | INTRAMUSCULAR | Status: AC
  Administered 2021-12-23: 400 mg
  Filled 2021-12-23: qty 20

## 2021-12-23 MED ORDER — SODIUM CHLORIDE (PF) 0.9 % IJ SOLN
INTRAMUSCULAR | Status: AC
  Filled 2021-12-23: qty 10

## 2021-12-23 MED ORDER — LACTATED RINGERS IV SOLN: Freq: Once | INTRAVENOUS | Status: DC

## 2021-12-23 MED ORDER — IOHEXOL 180 MG/ML  SOLN
10.0000 mL | Freq: Once | INTRAMUSCULAR | Status: AC
  Administered 2021-12-23: 10 mL via EPIDURAL
  Filled 2021-12-23: qty 20

## 2021-12-23 MED ORDER — PENTAFLUOROPROP-TETRAFLUOROETH EX AERO: INHALATION_SPRAY | Freq: Once | CUTANEOUS | Status: DC

## 2021-12-23 MED ORDER — TRIAMCINOLONE ACETONIDE 40 MG/ML IJ SUSP
40.0000 mg | Freq: Once | INTRAMUSCULAR | Status: AC
  Administered 2021-12-23: 40 mg
  Filled 2021-12-23: qty 1

## 2021-12-23 NOTE — Progress Notes (Signed)
PROVIDER NOTE: Interpretation of information contained herein should be left to medically-trained personnel. Specific patient instructions are provided elsewhere under "Patient Instructions" section of medical record. This document was created in part using STT-dictation technology, any transcriptional errors that may result from this process are unintentional.  Patient: James [name] Type: Established DOB: 1960-12-03 MRN: [id number] PCP: Olin Hauser, DO  Service: Procedure DOS: 12/23/2021 Setting: Ambulatory Location: Ambulatory outpatient facility Delivery: Face-to-face Provider: Gaspar Cola, MD Specialty: Interventional Pain Management Specialty designation: 09 Location: Outpatient facility Ref. Prov.: Milinda Pointer, MD    Primary Reason for Visit: Interventional Pain Management Treatment. CC: Leg Pain (Right thigh)   Procedure:           Type: Lumbar epidural steroid injection (LESI) (interlaminar) #3    Laterality: Right   Level:  L2-3 Level.  Imaging: Fluoroscopic guidance         Anesthesia: Local anesthesia (1-2% Lidocaine) Anxiolysis: IV Versed 2.0 mg Sedation: No Sedation                       DOS: 12/23/2021  Performed by: Gaspar Cola, MD  Purpose: Diagnostic/Therapeutic Indications: Lumbar radicular pain of intraspinal etiology of more than 4 weeks that has failed to respond to conservative therapy and is severe enough to impact quality of life or function. 1. 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 Chronic lower extremity pain (1ry area of Pain) (Bilateral)   3. DDD (degenerative disc disease), lumbar   4. 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 Herniated nucleus pulposus, L3-4 (Right)   6. 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 Lumbar lateral recess stenosis (Right: L3-4) (Bilateral: L4-5)    9. Lumbar nerve root impingement (Right: L4)   10. Lumbosacral radiculopathy at L5 (Left)   11. Class 3 obesity with alveolar hypoventilation, serious comorbidity, and body mass index (BMI) of 45.0 to 49.9 in adult [facility]    NAS-11 Pain score:   Pre-procedure: 9 /10   Post-procedure: 0-No pain/10      Position / Prep / Materials:  Position: Prone w/ head of the table raised (slight reverse trendelenburg) to facilitate breathing.  Prep solution: DuraPrep (Iodine Povacrylex [0.7% available iodine] and Isopropyl Alcohol, 74% w/w) Prep Area: Entire Posterior Lumbar Region from lower scapular tip down to mid buttocks area and from flank to flank. Materials:  Tray: Epidural tray Needle(s):  Type: Epidural needle (Tuohy) Gauge (G):  17 Length: Regular (3.5-in) Qty: 1  Pre-op H&P Assessment:  James [name] is a 61 y.o. (year old), male patient, seen today for interventional treatment. He  has a past surgical history that includes Colonoscopy with propofol; Appendectomy; and Colonoscopy with propofol (N/A, 04/23/2021). James [name] has a current medication list which includes the following prescription(s): albuterol, atorvastatin, hydrochlorothiazide, losartan, metformin, naloxone, oxycodone hcl, [START ON 12/25/2021] oxycodone hcl, [START ON 01/24/2022] oxycodone hcl, ozempic (1 mg/dose), and pregabalin, and the following Facility-Administered Medications: lactated ringers and pentafluoroprop-tetrafluoroeth. His primarily concern today is the Leg Pain (Right thigh)  Initial Vital Signs:  Pulse/HCG Rate: 89ECG Heart Rate: 88 Temp: (!) 96.6 F (35.9 C) Resp: 18 BP: (!) 139/93 SpO2: 97 %  BMI: Estimated body mass index is 43.81 kg/m as calculated from the following:   Height as of this encounter: 6' (1.829 m).   Weight as of this encounter: 323 lb (  146.5 kg).  Risk Assessment: Allergies: Reviewed. He has No Known Allergies.  Allergy Precautions: None required Coagulopathies: Reviewed.  None identified.  Blood-thinner therapy: None at this time Active Infection(s): Reviewed. None identified. James [name] is afebrile  Site Confirmation: James [name] was asked to confirm the procedure and laterality before marking the site Procedure checklist: Completed Consent: Before the procedure and under the influence of no sedative(s), amnesic(s), or anxiolytics, the patient was informed of the treatment options, risks and possible complications. To fulfill our ethical and legal obligations, as recommended by the American Medical Association's Code of Ethics, I have informed the patient of my clinical impression; the nature and purpose of the treatment or procedure; the risks, benefits, and possible complications of the intervention; the alternatives, including doing nothing; the risk(s) and benefit(s) of the alternative treatment(s) or procedure(s); and the risk(s) and benefit(s) of doing nothing. The patient was provided information about the general risks and possible complications associated with the procedure. These may include, but are not limited to: failure to achieve desired goals, infection, bleeding, organ or nerve damage, allergic reactions, paralysis, and death. In addition, the patient was informed of those risks and complications associated to Spine-related procedures, such as failure to decrease pain; infection (i.e.: Meningitis, epidural or intraspinal abscess); bleeding (i.e.: epidural hematoma, subarachnoid hemorrhage, or any other type of intraspinal or peri-dural bleeding); organ or nerve damage (i.e.: Any type of peripheral nerve, nerve root, or spinal cord injury) with subsequent damage to sensory, motor, and/or autonomic systems, resulting in permanent pain, numbness, and/or weakness of one or several areas of the body; allergic reactions; (i.e.: anaphylactic reaction); and/or death. Furthermore, the patient was informed of those risks and complications associated with the  medications. These include, but are not limited to: allergic reactions (i.e.: anaphylactic or anaphylactoid reaction(s)); adrenal axis suppression; blood sugar elevation that in diabetics may result in ketoacidosis or comma; water retention that in patients with history of congestive heart failure may result in shortness of breath, pulmonary edema, and decompensation with resultant heart failure; weight gain; swelling or edema; medication-induced neural toxicity; particulate matter embolism and blood vessel occlusion with resultant organ, and/or nervous system infarction; and/or aseptic necrosis of one or more joints. Finally, the patient was informed that Medicine is not an exact science; therefore, there is also the possibility of unforeseen or unpredictable risks and/or possible complications that may result in a catastrophic outcome. The patient indicated having understood very clearly. We have given the patient no guarantees and we have made no promises. Enough time was given to the patient to ask questions, all of which were answered to the patient's satisfaction. Mr. Machuca has indicated that he wanted to continue with the procedure. Attestation: I, the ordering provider, attest that I have discussed with the patient the benefits, risks, side-effects, alternatives, likelihood of achieving goals, and potential problems during recovery for the procedure that I have provided informed consent. Date  Time: 12/23/2021  9:06 AM  Pre-Procedure Preparation:  Monitoring: As per clinic protocol. Respiration, ETCO2, SpO2, BP, heart rate and rhythm monitor placed and checked for adequate function Safety Precautions: Patient was assessed for positional comfort and pressure points before starting the procedure. Time-out: I initiated and conducted the "Time-out" before starting the procedure, as per protocol. The patient was asked to participate by confirming the accuracy of the "Time Out" information.  Verification of the correct person, site, and procedure were performed and confirmed by me, the nursing staff, and the patient. "Time-out" conducted as per  Joint Commission's Universal Protocol (UP.01.01.01). Time: 1008  Description/Narrative of Procedure:          Target: Epidural space via interlaminar opening, initially targeting the lower laminar border of the superior vertebral body. Region: Lumbar Approach: Percutaneous paravertebral  Rationale (medical necessity): procedure needed and proper for the diagnosis and/or treatment of the patient's medical symptoms and needs. Procedural Technique Safety Precautions: Aspiration looking for blood return was conducted prior to all injections. At no point did we inject any substances, as a needle was being advanced. No attempts were made at seeking any paresthesias. Safe injection practices and needle disposal techniques used. Medications properly checked for expiration dates. SDV (single dose vial) medications used. Description of the Procedure: Protocol guidelines were followed. The procedure needle was introduced through the skin, ipsilateral to the reported pain, and advanced to the target area. Bone was contacted and the needle walked caudad, until the lamina was cleared. The epidural space was identified using "loss-of-resistance technique" with 2-3 ml of PF-NaCl (0.9% NSS), in a 5cc LOR glass syringe.  Vitals:   12/23/21 0905 12/23/21 1009 12/23/21 1013 12/23/21 1025  BP: (!) 139/93 (!) 173/124 (!) 181/108 (!) 150/90  Pulse: 89   94  Resp: 18 19    Temp: (!) 96.6 F (35.9 C)   (!) 97 F (36.1 C)  TempSrc: Temporal   Temporal  SpO2: 97% 93% 90% 96%  Weight: (!) 323 lb (146.5 kg)     Height: 6' (1.829 m)       Start Time: 1008 hrs. End Time: 1013 hrs.  Imaging Guidance (Spinal):          Type of Imaging Technique: Fluoroscopy Guidance (Spinal) Indication(s): Assistance in needle guidance and placement for procedures requiring needle  placement in or near specific anatomical locations not easily accessible without such assistance. Exposure Time: Please see nurses notes. Contrast: Before injecting any contrast, we confirmed that the patient did not have an allergy to iodine, shellfish, or radiological contrast. Once satisfactory needle placement was completed at the desired level, radiological contrast was injected. Contrast injected under live fluoroscopy. No contrast complications. See chart for type and volume of contrast used. Fluoroscopic Guidance: I was personally present during the use of fluoroscopy. "Tunnel Vision Technique" used to obtain the best possible view of the target area. Parallax error corrected before commencing the procedure. "Direction-depth-direction" technique used to introduce the needle under continuous pulsed fluoroscopy. Once target was reached, antero-posterior, oblique, and lateral fluoroscopic projection used confirm needle placement in all planes. Images permanently stored in EMR. Interpretation: I personally interpreted the imaging intraoperatively. Adequate needle placement confirmed in multiple planes. Appropriate spread of contrast into desired area was observed. No evidence of afferent or efferent intravascular uptake. No intrathecal or subarachnoid spread observed. Permanent images saved into the patient's record.  Antibiotic Prophylaxis:   Anti-infectives (From admission, onward)    None      Indication(s): None identified  Post-operative Assessment:  Post-procedure Vital Signs:  Pulse/HCG Rate: 9487 Temp: (!) 97 F (36.1 C) Resp: 19 BP: (!) 150/90 SpO2: 96 %  EBL: None  Complications: No immediate post-treatment complications observed by team, or reported by patient.  Note: The patient tolerated the entire procedure well. A repeat set of vitals were taken after the procedure and the patient was kept under observation following institutional policy, for this type of procedure.  Post-procedural neurological assessment was performed, showing return to baseline, prior to discharge. The patient was provided with post-procedure discharge instructions, including a  section on how to identify potential problems. Should any problems arise concerning this procedure, the patient was given instructions to immediately contact us, at any time, without hesitation. In any case, we plan to contact the patient by telephone for a follow-up status report regarding this interventional procedure.  Comments:  No additional relevant information.  Plan of Care  Orders:  Orders Placed This Encounter  Procedures   Lumbar Epidural Injection    Scheduling Instructions:     Procedure: Interlaminar LESI L2-3     Laterality: Right     Sedation: Patient's choice     Timeframe: Today    Order Specific Question:   Where will this procedure be performed?    Answer:   [facility]   DG PAIN [facility] C-ARM 1-60 MIN NO REPORT    Intraoperative interpretation by procedural physician at [place].    Standing Status:   Standing    Number of Occurrences:   1    Order Specific Question:   Reason for exam:    Answer:   Assistance in needle guidance and placement for procedures requiring needle placement in or near specific anatomical locations not easily accessible without such assistance.   Informed Consent Details: Physician/Practitioner Attestation; Transcribe to consent form and obtain patient signature    Note: Always confirm laterality of pain with Mr. Delph, before procedure. Transcribe to consent form and obtain patient signature.    Order Specific Question:   Physician/Practitioner attestation of informed consent for procedure/surgical case    Answer:   I, the physician/practitioner, attest that I have discussed with the patient the benefits, risks, side effects, alternatives, likelihood of achieving goals and potential problems during recovery for the procedure that I have  provided informed consent.    Order Specific Question:   Procedure    Answer:   Lumbar epidural steroid injection under fluoroscopic guidance    Order Specific Question:   Physician/Practitioner performing the procedure    Answer:   Gavin Faivre A. Dossie Arbour, MD    Order Specific Question:   Indication/Reason    Answer:   Low back and/or lower extremity pain secondary to lumbar radiculitis   Provide equipment / supplies at bedside    Procedural tray: Epidural Tray (Disposable  single use) Skin infiltration needle: Regular 1.5-in, 25-G, (x1) Block needle size: Long Catheter: No catheter required    Standing Status:   Standing    Number of Occurrences:   1    Order Specific Question:   Specify    Answer:   Epidural Tray   Chronic Opioid Analgesic:  Oxycodone/APAP 10/325 (# 60/month), 1 tab p.o. twice daily (last filled on 02/03/2021) Needs to loose an average of 2.75 lbs/mo to be considered for opioid pharmacotherapy.  MME/day: 30 mg/day   Medications ordered for procedure: Meds ordered this encounter  Medications   iohexol (OMNIPAQUE) 180 MG/ML injection 10 mL    Must be Myelogram-compatible. If not available, you may substitute with a water-soluble, non-ionic, hypoallergenic, myelogram-compatible radiological contrast medium.   lidocaine (XYLOCAINE) 2 % (with pres) injection 400 mg   pentafluoroprop-tetrafluoroeth (GEBAUERS) aerosol   lactated ringers infusion   midazolam (VERSED) injection 0.5-2 mg    Make sure Flumazenil is available in the pyxis when using this medication. If oversedation occurs, administer 0.2 mg IV over 15 sec. If after 45 sec no response, administer 0.2 mg again over 1 min; may repeat at 1 min intervals; not to exceed 4 doses (1 mg)   sodium  chloride flush (NS) 0.9 % injection 2 mL   ropivacaine (PF) 2 mg/mL (0.2%) (NAROPIN) injection 2 mL   triamcinolone acetonide (KENALOG-40) injection 40 mg   Medications administered: We administered iohexol, lidocaine,  midazolam, sodium chloride flush, ropivacaine (PF) 2 mg/mL (0.2%), and triamcinolone acetonide.  See the medical record for exact dosing, route, and time of administration.  Follow-up plan:   Return in about 2 weeks (around 01/06/2022) for Proc-day (T,Th), (VV), (PPE).       Interventional Therapies  Risk  Complexity Considerations:   Estimated body mass index is 45.98 kg/m down from 50.97 kg/m as calculated from the following:   Height as of this encounter: 6' (1.829 m).   Weight as of this encounter: 339 lbs down from 375 lb 12.8 oz (170.5 kg). Our goal is to bring his BMI down to 30 kg/m (220 lbs for his height). He is supposed to lose 2.75 lbs/month. WNL   Planned  Pending:   Diagnostic/therapeutic right L 2-3 LESI #3   Notes will be requested from the [place] and [place].   Under consideration:   Diagnostic/therapeutic left IA AC + glenohumeral joint inj. #1  Diagnostic/therapeutic left suprascapular NB #1  Diagnostic/therapeutic bilateral IA hip inj. #1  Diagnostic/therapeutic bilateral IA steroid knee inj. #1  Therapeutic bilateral IA viscosupplementation knee inj. #1  Diagnostic/therapeutic bilateral lumbar facet MBB #1    Completed:   Diagnostic/therapeutic right L3-4 LESI x1 (11/04/21) (100/100/25/25) (100% of LBP)  Diagnostic/therapeutic right L4-5 LESI x2 (11/30/2021) (100/100/100/80)  Toradol/Norflex IM 60/60 mg (03/29/2021)  Referral to orthopedic surgery for evaluation of knees and hip (03/29/2021)  Referral to medical weight management (02/15/2021)  Referral to bariatric surgery (02/15/2021)    Completed by other providers:   [facility]  EMG/PNCV (LE) by Gurney Maxin, MD [facility] neurology) (03/16/2021) (chronic, severe sensorimotor polyneuropathy)   Therapeutic  Palliative (PRN) options:   None established      Recent Visits Date Type Provider Dept  12/14/21 Office Visit Milinda Pointer, MD  [facility]  11/30/21 Procedure visit Milinda Pointer, MD [facility]  11/15/21 Office Visit Milinda Pointer, MD [facility]  11/04/21 Procedure visit Milinda Pointer, MD [facility]  10/20/21 Office Visit Milinda Pointer, MD [facility]  Showing recent visits within past 90 days and meeting all other requirements Today's Visits Date Type Provider Dept  12/23/21 Procedure visit Milinda Pointer, MD [facility]  Showing today's visits and meeting all other requirements Future Appointments Date Type Provider Dept  01/06/22 Appointment Milinda Pointer, MD [facility]  02/16/22 Appointment Milinda Pointer, MD [facility]  Showing future appointments within next 90 days and meeting all other requirements  Disposition: Discharge home  Discharge (Date  Time): 12/23/2021; 1030 hrs.   Primary Care Physician: Olin Hauser, DO Location: [facility] Note by: Gaspar Cola, MD Date: 12/23/2021; Time: 10:41 AM  Disclaimer:  Medicine is not an Chief Strategy Officer. The only guarantee in medicine is that nothing is guaranteed. It is important to note that the decision to proceed with this intervention was based on the information collected from the patient. The Data and conclusions were drawn from the patient's questionnaire, the interview, and the physical examination. Because the information was provided in large part by the patient, it cannot be guaranteed that it has not been purposely or unconsciously manipulated. Every effort has been made to obtain as much relevant data  as possible for this evaluation. It is important to note that the conclusions that lead to this procedure are derived in large part from the available data. Always take into account that the treatment will also be dependent on availability of resources and existing treatment guidelines, considered  by other Pain Management Practitioners as being common knowledge and practice, at the time of the intervention. For Medico-Legal purposes, it is also important to point out that variation in procedural techniques and pharmacological choices are the acceptable norm. The indications, contraindications, technique, and results of the above procedure should only be interpreted and judged by a Board-Certified Interventional Pain Specialist with extensive familiarity and expertise in the same exact procedure and technique.

## 2021-12-23 NOTE — Patient Instructions (Signed)
____________________________________________________________________________________________  Virtual Visits   ID our calls: Add these numbers to your list of contacts on your smart phone. Label it as "PAIN Management" Main: [phone] [facility]  [place]) Nurses: [phone] (This number is for outgoing calls only. Do not call this number.) Dr. Dossie Arbour: [phone] (Outgoing calls only. Do not call back to this number.)  What is a "Virtual Visit"? It is a Metallurgist (medical visit) that takes place on real time (NOT TEXT or E-MAIL) over the telephone or computer device (desktop, laptop, tablet, smart phone, etc.). It allows for more location flexibility between the patient and the healthcare provider.  Who decides when these types of visits will be used? The physician.  Who is eligible for these types of visits? Only those patients that can be reliably reached over the telephone.  What do you mean by reliably? We do not have time to call everyone multiple times, therefore those that tend to screen calls and then call back later are not suitable candidates for this system. We understand how people are reluctant to pickup on "unknown" calls, therefore, we suggest adding our telephone numbers to your list of "CONTACT(s)". This way, you should be able to readily identify our calls when you receive one. All of our numbers are available below.   Who is not eligible? This option is not available for medication management encounters, specially for controlled substances. Patients on pain medications that fall under the category of controlled substances have to come in for "Face-to-Face" encounters. This is required for mandatory monitoring of these substances. You may be asked to provide a sample for an unannounced urine drug screening test (UDS), and we will need to count your pain pills. Not bringing your pills to be counted  may result in no refill. Obviously, neither one of these can be done over the phone.  When will this type of visits be used? You can request a virtual visit whenever you are physically unable to attend a regular appointment. The decision will be made by the physician (or healthcare provider) on a case by case basis.   At what time will I be called? This is an excellent question. The providers will try to call you whenever they have time available. Do not expect to be called at any specific time. The secretaries will assign you a time for your virtual visit appointment, but this is done simply to keep a list of those patients that need to be called, but not for the purpose of keeping a time schedule. Be advised that the call may come in anytime during the day, between the hours of 8:00 AM and 8::00 PM, depending on provider availability. We do understand that the system is not perfect. If you are unable to be available that day on a moments notice, then request an "in-person" appointment rather than a "virtual visit".  Can I request my medication visits to be "Virtual"? Yes you may request it, but the decision is entirely up to the healthcare provider. Control substances require specific monitoring that requires Face-to-Face encounters. The number of encounters  and the extent of the monitoring is determined on a case by case basis.  ____________________________________________________________________________________________    ____________________________________________________________________________________________  Post-Procedure Discharge Instructions  Instructions: Apply ice:  Purpose: This will minimize any swelling and discomfort after procedure.  When: Day of procedure, as soon as you get home. How: Fill a plastic sandwich bag with crushed ice. Cover it  with a small towel and apply to injection site. How long: (15 min on, 15 min off) Apply for 15 minutes then remove x 15 minutes.   Repeat sequence on day of procedure, until you go to bed. Apply heat:  Purpose: To treat any soreness and discomfort from the procedure. When: Starting the next day after the procedure. How: Apply heat to procedure site starting the day following the procedure. How long: May continue to repeat daily, until discomfort goes away. Food intake: Start with clear liquids (like water) and advance to regular food, as tolerated.  Physical activities: Keep activities to a minimum for the first 8 hours after the procedure. After that, then as tolerated. Driving: If you have received any sedation, be responsible and do not drive. You are not allowed to drive for 24 hours after having sedation. Blood thinner: (Applies only to those taking blood thinners) You may restart your blood thinner 6 hours after your procedure. Insulin: (Applies only to Diabetic patients taking insulin) As soon as you can eat, you may resume your normal dosing schedule. Infection prevention: Keep procedure site clean and dry. Shower daily and clean area with soap and water. Post-procedure Pain Diary: Extremely important that this be done correctly and accurately. Recorded information will be used to determine the next step in treatment. For the purpose of accuracy, follow these rules: Evaluate only the area treated. Do not report or include pain from an untreated area. For the purpose of this evaluation, ignore all other areas of pain, except for the treated area. After your procedure, avoid taking a long nap and attempting to complete the pain diary after you wake up. Instead, set your alarm clock to go off every hour, on the hour, for the initial 8 hours after the procedure. Document the duration of the numbing medicine, and the relief you are getting from it. Do not go to sleep and attempt to complete it later. It will not be accurate. If you received sedation, it is likely that you were given a medication that may cause amnesia. Because  of this, completing the diary at a later time may cause the information to be inaccurate. This information is needed to plan your care. Follow-up appointment: Keep your post-procedure follow-up evaluation appointment after the procedure (usually 2 weeks for most procedures, 6 weeks for radiofrequencies). DO NOT FORGET to bring you pain diary with you.   Expect: (What should I expect to see with my procedure?) From numbing medicine (AKA: Local Anesthetics): Numbness or decrease in pain. You may also experience some weakness, which if present, could last for the duration of the local anesthetic. Onset: Full effect within 15 minutes of injected. Duration: It will depend on the type of local anesthetic used. On the average, 1 to 8 hours.  From steroids (Applies only if steroids were used): Decrease in swelling or inflammation. Once inflammation is improved, relief of the pain will follow. Onset of benefits: Depends on the amount of swelling present. The more swelling, the longer it will take for the benefits to be seen. In some cases, up to 10 days. Duration: Steroids will stay in the system x 2 weeks. Duration of benefits will depend on multiple posibilities including persistent irritating factors. Side-effects: If present, they may typically last 2 weeks (the duration of the steroids). Frequent: Cramps (if they occur, drink Gatorade and take over-the-counter Magnesium 450-500 mg once to twice a day); water retention with temporary weight gain; increases in blood sugar; decreased immune system  response; increased appetite. Occasional: Facial flushing (red, warm cheeks); mood swings; menstrual changes. Uncommon: Long-term decrease or suppression of natural hormones; bone thinning. (These are more common with higher doses or more frequent use. This is why we prefer that our patients avoid having any injection therapies in other practices.)  Very Rare: Severe mood changes; psychosis; aseptic necrosis. From  procedure: Some discomfort is to be expected once the numbing medicine wears off. This should be minimal if ice and heat are applied as instructed.  Call if: (When should I call?) You experience numbness and weakness that gets worse with time, as opposed to wearing off. New onset bowel or bladder incontinence. (Applies only to procedures done in the spine)  Emergency Numbers: Durning business hours (Monday - Thursday, 8:00 AM - 4:00 PM) (Friday, 9:00 AM - 12:00 Noon): [phone] After hours: [phone] NOTE: If you are having a problem and are unable connect with, or to talk to a provider, then go to your nearest urgent care or emergency department. If the problem is serious and urgent, please call 911. ____________________________________________________________________________________________    ____________________________________________________________________________________________  Patient Information update  To: All of our patients.  Re: Name change.  It has been made official that our current name, "[facility]"   will soon be changed to "[facility]".   The purpose of this change is to eliminate any confusion created by the concept of our practice being a "Medication Management Pain Clinic". In the past this has led to the misconception that we treat pain primarily by the use of prescription medications.  Nothing can be farther from the truth.   Understanding PAIN MANAGEMENT: To further understand what our practice does, you first have to understand that "Pain Management" is a subspecialty that requires additional training once a physician has completed their specialty training, which can be in either Anesthesia, Neurology, Psychiatry, or Physical Medicine and Rehabilitation (PMR). Each one of these contributes to the final approach taken by each physician to the  management of their patient's pain. To be a "Pain Management Specialist" you must have first completed one of the specialty trainings below.  Anesthesiologists - trained in clinical pharmacology and interventional techniques such as nerve blockade and regional as well as central neuroanatomy. They are trained to block pain before, during, and after surgical interventions.  Neurologists - trained in the diagnosis and pharmacological treatment of complex neurological conditions, such as Multiple Sclerosis, Parkinson's, spinal cord injuries, and other systemic conditions that may be associated with symptoms that may include but are not limited to pain. They tend to rely primarily on the treatment of chronic pain using prescription medications.  Psychiatrist - trained in conditions affecting the psychosocial wellbeing of patients including but not limited to depression, anxiety, schizophrenia, personality disorders, addiction, and other substance use disorders that may be associated with chronic pain. They tend to rely primarily on the treatment of chronic pain using prescription medications.   Physical Medicine and Rehabilitation (PMR) physicians, also known as physiatrists - trained to treat a wide variety of medical conditions affecting the brain, spinal cord, nerves, bones, joints, ligaments, muscles, and tendons. Their training is primarily aimed at treating patients that have suffered injuries that have caused severe physical impairment. Their training is primarily aimed at the physical therapy and rehabilitation of those patients. They may also work alongside orthopedic surgeons or neurosurgeons using their expertise in assisting surgical patients to recover after  their surgeries.  INTERVENTIONAL PAIN MANAGEMENT is sub-subspecialty of Pain Management.  Our physicians are Board-certified in Anesthesia, Pain Management, and Interventional Pain Management.  This meaning that not only have they been  trained and Board-certified in their specialty of Anesthesia, and subspecialty of Pain Management, but they have also received further training in the sub-subspecialty of Interventional Pain Management, in order to become Board-certified as INTERVENTIONAL PAIN MANAGEMENT SPECIALIST.    Mission: Our goal is to use our skills in  [place] as alternatives to the chronic use of prescription opioid medications for the treatment of pain. To make this more clear, we have changed our name to reflect what we do and offer. We will continue to offer medication management assessment and recommendations, but we will not be taking over any patient's medication management.  ____________________________________________________________________________________________

## 2021-12-23 NOTE — Progress Notes (Signed)
Safety precautions to be maintained throughout the outpatient stay will include: orient to surroundings, keep bed in low position, maintain call bell within reach at all times, provide assistance with transfer out of bed and ambulation.  

## 2021-12-24 NOTE — Progress Notes (Signed)
Called PP denies any needs at this time. Instructed to call if needed.

## 2021-12-30 ENCOUNTER — Other Ambulatory Visit: Payer: Self-pay | Admitting: Family Medicine

## 2021-12-30 DIAGNOSIS — Z794 Long term (current) use of insulin: Secondary | ICD-10-CM

## 2021-12-30 NOTE — Telephone Encounter (Signed)
Requested Prescriptions  Pending Prescriptions Disp Refills   metFORMIN (GLUCOPHAGE) 1000 MG tablet [Pharmacy Med Name: metFORMIN HCl 1000 MG Oral Tablet] 90 tablet 0    Sig: TAKE 1 TABLET BY MOUTH WITH BREAKFAST     Endocrinology:  Diabetes - Biguanides Failed - 12/30/2021  6:50 AM      Failed - Cr in normal range and within 360 days    Creat  Date Value Ref Range Status  06/25/2021 0.67 (L) 0.70 - 1.30 mg/dL Final         Failed - B12 Level in normal range and within 720 days    Vitamin B-12  Date Value Ref Range Status  02/15/2021 217 (L) 232 - 1,245 pg/mL Final         Passed - HBA1C is between 0 and 7.9 and within 180 days    Hemoglobin A1C  Date Value Ref Range Status  11/12/2021 6.1 (A) 4.0 - 5.6 % Final   Hgb A1c MFr Bld  Date Value Ref Range Status  06/25/2021 6.0 (H) <5.7 % of total Hgb Final    Comment:    For someone without known diabetes, a hemoglobin  A1c value between 5.7% and 6.4% is consistent with prediabetes and should be confirmed with a  follow-up test. . For someone with known diabetes, a value <7% indicates that their diabetes is well controlled. A1c targets should be individualized based on duration of diabetes, age, comorbid conditions, and other considerations. . This assay result is consistent with an increased risk of diabetes. . Currently, no consensus exists regarding use of hemoglobin A1c for diagnosis of diabetes for children. .          Passed - eGFR in normal range and within 360 days    GFR, Estimated  Date Value Ref Range Status  10/30/2020 >60 >60 mL/min Final    Comment:    (NOTE) Calculated using the CKD-EPI Creatinine Equation (2021)    eGFR  Date Value Ref Range Status  06/25/2021 108 > OR = 60 mL/min/1.72m Final    Comment:    The eGFR is based on the CKD-EPI 2021 equation. To calculate  the new eGFR from a previous Creatinine or Cystatin C result, go to  https://www.kidney.org/professionals/ kdoqi/[email or web address]   02/15/2021 107 >59 mL/min/1.73 Final         Passed - Valid encounter within last 6 months    Recent Outpatient Visits           1 month ago Type 2 diabetes mellitus with other specified complication, with long-term current use of insulin ([facility]   [place]   6 months ago Annual physical exam   [place]   10 months ago Pain and swelling of toe of left foot   [facility]   11 months ago DDD (degenerative disc disease), lumbar   [place]   11 months ago Chronic pain syndrome   [place]       Future Appointments             In 1 month [facility] AD[facility]onne Doughty DO [facility] normal limits and completed in the last 12 months    WBC  Date Value Ref Range Status  06/25/2021 11.5 (H) 3.8 - 10.8 Thousand/uL Final   RBC  Date Value Ref Range Status  06/25/2021 5.36 4.20 - 5.80 Million/uL Final   Hemoglobin  Date Value Ref Range Status  06/25/2021 15.4 13.2 - 17.1 g/dL Final   HCT  Date Value Ref Range Status  06/25/2021 47.0 38.5 - 50.0 % Final   MCHC  Date Value Ref Range Status  06/25/2021 32.8 32.0 - 36.0 g/dL Final   [facility]  Date Value Ref Range Status  06/25/2021 28.7 27.0 - 33.0 pg Final   MCV  Date Value Ref Range Status  06/25/2021 87.7 80.0 - 100.0 fL Final   No results found for: "PLTCOUNTKUC", "LABPLAT", "POCPLA" RDW  Date Value Ref Range Status  06/25/2021 13.8 11.0 - 15.0 % Final

## 2022-01-02 NOTE — Progress Notes (Unsigned)
Patient: James [name]  Service Category: E/M  Provider: Gaspar Cola, MD  DOB: 04-04-1960  DOS: 01/06/2022  Location: Office  MRN: [id number]  Setting: Ambulatory outpatient  Referring Provider: Nobie [name] *  Type: Established Patient  Specialty: Interventional Pain Management  PCP: James Hauser, DO  Location: Remote location  Delivery: TeleHealth     Virtual Encounter - Pain Management PROVIDER NOTE: Information contained herein reflects review and annotations entered in association with encounter. Interpretation of such information and data should be left to medically-trained personnel. Information provided to patient can be located elsewhere in the medical record under "Patient Instructions". Document created using STT-dictation technology, any transcriptional errors that may result from process are unintentional.    Contact & Pharmacy Preferred: [phone] Home: [phone] (home) Mobile: [phone] (mobile) E-mail: [email or web address] .[facility] [address] ([address]), [place] ([place]) [place] [address] Phone: [phone] Fax: [place] [address] Phone: [phone] Fax: [phone]  [id number] [place] [address] [id number]: [phone]: [phone]  Pre-screening  [name] "in-person" vs "virtual" encounter. He indicated preferring virtual for this encounter.   Reason COVID-19*  Social distancing based on CDC and AMA recommendations.   I contacted James Birchwoodon 01/06/2022 via telephone.      I clearly identified myself as FGaspar Cola MD. I verified that I was speaking with the correct person using two identifiers (Name: James [name] and date of birth:  [birth year].  Consent I sought verbal advanced consent from James Birchwoodfor virtual visit interactions. I informed Mr. SJasinskiof possible security and privacy concerns, risks, and limitations associated with providing "not-in-person" medical evaluation and management services. I also informed Mr. SBohlkenof the availability of "in-person" appointments. Finally, I informed him that there would be a charge for the virtual visit and that he could be  personally, fully or partially, financially responsible for it. Mr. SMasonerexpressed understanding and agreed to proceed.   Historic Elements   Mr. WSamel Brunais a 61y.o. year old, male patient evaluated today after our last contact on 12/23/2021. [name] has a past medical history of Arthritis, Diabetes mellitus without complication ([place], Hyperlipidemia, and Hypertension. He also  has a past surgical history that includes Colonoscopy with propofol; Appendectomy; and Colonoscopy with propofol (N/A, 04/23/2021). Mr. SLuberhas a current medication list which includes the following prescription(s): albuterol, atorvastatin, hydrochlorothiazide, losartan, metformin, naloxone, oxycodone hcl, oxycodone hcl, [START ON 01/24/2022] oxycodone hcl, ozempic (1 mg/dose), and pregabalin. He  reports that he has been smoking cigarettes. He has a 2.50 pack-year smoking history. He has never used smokeless tobacco. He reports that he does not drink alcohol and does not use drugs. Mr. SRewertshas No Known Allergies.  Estimated body mass index is 43.81 kg/m as calculated from the following:   Height as of 12/23/21: 6' (1.829 m).   Weight as of 12/23/21: 323 lb (146.5 kg).  HPI  Today, he is being contacted for a post-procedure assessment.  Post-procedure evaluation   Type: Lumbar epidural steroid injection (LESI) (interlaminar) #3    Laterality: Right   Level:  L2-3 Level.  Imaging: Fluoroscopic guidance         Anesthesia: Local  anesthesia (1-2% Lidocaine) Anxiolysis: IV Versed 2.0 mg Sedation:  No Sedation                       DOS: 12/23/2021  Performed by: James Cola, MD  Purpose: Diagnostic/Therapeutic Indications: Lumbar radicular pain of intraspinal etiology of more than 4 weeks that has failed to respond to conservative therapy and is severe enough to impact quality of life or function. 1. 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 Chronic lower extremity pain (1ry area of Pain) (Bilateral)   3. DDD (degenerative disc disease), lumbar   4. 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 Herniated nucleus pulposus, L3-4 (Right)   6. 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 Lumbar lateral recess stenosis (Right: L3-4) (Bilateral: L4-5)   9. Lumbar nerve root impingement (Right: L4)   10. Lumbosacral radiculopathy at L5 (Left)   11. Class 3 obesity with alveolar hypoventilation, serious comorbidity, and body mass index (BMI) of 45.0 to 49.9 in adult [facility])    NAS-11 Pain score:   Pre-procedure: 9 /10   Post-procedure: 0-No pain/10      Effectiveness:  Initial hour after procedure:   ***. Subsequent 4-6 hours post-procedure:   ***. Analgesia past initial 6 hours:   ***. Ongoing improvement:  Analgesic:  *** Function:    ***    ROM:    ***     Pharmacotherapy Assessment   Opioid Analgesic: Oxycodone/APAP 10/325 (# 60/month), 1 tab p.o. twice daily (last filled on 02/03/2021) Needs to loose an average of 2.75 lbs/mo to be considered for opioid pharmacotherapy.  MME/day: 30 mg/day   Monitoring: [place] PMP: PDMP reviewed during this encounter.       Pharmacotherapy: No side-effects or adverse reactions reported. Compliance: No problems identified. Effectiveness: Clinically acceptable. Plan: Refer to "POC". UDS:  Summary  Date Value Ref Range Status  02/15/2021 Note   Final    Comment:    ==================================================================== Compliance Drug Analysis, Ur ==================================================================== Test                             Result       Flag       Units  Drug Present and Declared for Prescription Verification   Oxycodone                      3020         EXPECTED   ng/mg creat   Oxymorphone                    1094         EXPECTED   ng/mg creat   Noroxycodone                   3969         EXPECTED   ng/mg creat   Noroxymorphone                 265          EXPECTED   ng/mg creat    Sources of oxycodone are scheduled prescription medications.    Oxymorphone, noroxycodone, and noroxymorphone are expected    metabolites of oxycodone. Oxymorphone is also available as a    scheduled prescription medication.    Pregabalin  PRESENT      EXPECTED   Acetaminophen                  PRESENT      EXPECTED ==================================================================== Test                      Result    Flag   Units      Ref Range   Creatinine              172              mg/dL      >=20 ==================================================================== Declared Medications:  The flagging and interpretation on this report are based on the  following declared medications.  Unexpected results may arise from  inaccuracies in the declared medications.   **Note: The testing scope of this panel includes these medications:   Oxycodone (Percocet)  Pregabalin (Lyrica)   **Note: The testing scope of this panel does not include small to  moderate amounts of these reported medications:   Acetaminophen (Percocet)   **Note: The testing scope of this panel does not include the  following reported medications:   Atorvastatin (Lipitor)  Hydrochlorothiazide (Hydrodiuril)  Insulin Tyler Aas)  Losartan (Cozaar)  Metformin (Glucophage)  Semaglutide  (Ozempic) ==================================================================== For clinical consultation, please call [phone]. ====================================================================    No results found for: "CBDTHCR", "D8THCCBX", "D9THCCBX"   Laboratory Chemistry Profile   Renal Lab Results  Component Value Date   BUN 12 06/25/2021   CREATININE 0.67 (L) 06/25/2021   BCR 18 06/25/2021   GFRNONAA >60 10/30/2020    Hepatic Lab Results  Component Value Date   AST 10 06/25/2021   ALT 8 (L) 06/25/2021   ALBUMIN 4.1 02/15/2021   ALKPHOS 116 02/15/2021    Electrolytes Lab Results  Component Value Date   NA 142 06/25/2021   K 4.1 06/25/2021   CL 100 06/25/2021   CALCIUM 9.0 06/25/2021   MG 1.8 02/15/2021    Bone Lab Results  Component Value Date   25OHVITD1 13 (L) 02/15/2021   25OHVITD2 <1.0 02/15/2021   25OHVITD3 12 02/15/2021    Inflammation (CRP: Acute Phase) (ESR: Chronic Phase) Lab Results  Component Value Date   CRP 29 (H) 02/15/2021   ESRSEDRATE 39 (H) 02/15/2021         Note: Above Lab results reviewed.  Imaging  DG PAIN CLINIC C-ARM 1-60 MIN NO REPORT Fluoro was used, but no Radiologist interpretation will be provided.  Please refer to "NOTES" tab for provider progress note.  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 Diagnoses of Chronic lower extremity pain (1ry area of Pain) (Bilateral), Lumbosacral radiculopathy at L5 (Left), and Chronic pain syndrome were also pertinent to this visit.  Plan of Care  Problem-specific:  No problem-specific Assessment & Plan notes found for this encounter.  James [name] has a current medication list which includes the following long-term medication(s): albuterol, atorvastatin, hydrochlorothiazide, losartan, metformin, oxycodone hcl, oxycodone hcl, [START ON 01/24/2022] oxycodone hcl, and  pregabalin.  Pharmacotherapy (Medications Ordered): No orders of the defined types were placed in this encounter.  Orders:  No orders of the defined types were placed in this encounter.  Follow-up plan:   No follow-ups on file.     Interventional Therapies  Risk  Complexity Considerations:   Estimated body mass index is 45.98 kg/m down from 50.97 kg/m as calculated from the  following:   Height as of this encounter: 6' (1.829 m).   Weight as of this encounter: 339 lbs down from 375 lb 12.8 oz (170.5 kg). Our goal is to bring his BMI down to 30 kg/m (220 lbs for his height). He is supposed to lose 2.75 lbs/month. WNL   Planned  Pending:   Diagnostic/therapeutic right L 2-3 LESI #3   Notes will be requested from the [place].   Under consideration:   Diagnostic/therapeutic left IA AC + glenohumeral joint inj. #1  Diagnostic/therapeutic left suprascapular NB #1  Diagnostic/therapeutic bilateral IA hip inj. #1  Diagnostic/therapeutic bilateral IA steroid knee inj. #1  Therapeutic bilateral IA viscosupplementation knee inj. #1  Diagnostic/therapeutic bilateral lumbar facet MBB #1    Completed:   Diagnostic/therapeutic right L3-4 LESI x1 (11/04/21) (100/100/25/25) (100% of LBP)  Diagnostic/therapeutic right L4-5 LESI x2 (11/30/2021) (100/100/100/80)  Toradol/Norflex IM 60/60 mg (03/29/2021)  Referral to orthopedic surgery for evaluation of knees and hip (03/29/2021)  Referral to medical weight management (02/15/2021)  Referral to bariatric surgery (02/15/2021)    Completed by other providers:   [facility]  EMG/PNCV (LE) by Gurney Maxin, MD [facility] neurology) (03/16/2021) (chronic, severe sensorimotor polyneuropathy)   Therapeutic  Palliative (PRN) options:   None established       Recent Visits Date Type Provider Dept  12/23/21 Procedure visit Milinda Pointer, MD [facility]  12/14/21 Office  Visit Milinda Pointer, MD [facility]  11/30/21 Procedure visit Milinda Pointer, MD [facility]  11/15/21 Office Visit Milinda Pointer, MD [facility]  11/04/21 Procedure visit Milinda Pointer, MD [facility]  10/20/21 Office Visit Milinda Pointer, MD [facility]  Showing recent visits within past 90 days and meeting all other requirements Future Appointments Date Type Provider Dept  01/06/22 Appointment Milinda Pointer, MD [facility]  02/16/22 Appointment Milinda Pointer, MD [facility]  Showing future appointments within next 90 days and meeting all other requirements  I discussed the assessment and treatment plan with the patient. The patient was provided an opportunity to ask questions and all were answered. The patient agreed with the plan and demonstrated an understanding of the instructions.  Patient advised to call back or seek an in-person evaluation if the symptoms or condition worsens.  Duration of encounter: *** minutes.  Note by: James Cola, MD Date: 01/06/2022; Time: 10:44 AM

## 2022-01-05 ENCOUNTER — Encounter: Payer: Self-pay | Admitting: Pain Medicine

## 2022-01-05 ENCOUNTER — Other Ambulatory Visit: Payer: Self-pay | Admitting: Family Medicine

## 2022-01-05 DIAGNOSIS — G894 Chronic pain syndrome: Secondary | ICD-10-CM

## 2022-01-05 DIAGNOSIS — M159 Polyosteoarthritis, unspecified: Secondary | ICD-10-CM

## 2022-01-06 ENCOUNTER — Ambulatory Visit: Payer: Medicare HMO | Attending: Pain Medicine | Admitting: Pain Medicine

## 2022-01-06 DIAGNOSIS — M79604 Pain in right leg: Secondary | ICD-10-CM | POA: Diagnosis not present

## 2022-01-06 DIAGNOSIS — M5417 Radiculopathy, lumbosacral region: Secondary | ICD-10-CM

## 2022-01-06 DIAGNOSIS — G894 Chronic pain syndrome: Secondary | ICD-10-CM

## 2022-01-06 DIAGNOSIS — M5441 Lumbago with sciatica, right side: Secondary | ICD-10-CM

## 2022-01-06 DIAGNOSIS — M79605 Pain in left leg: Secondary | ICD-10-CM

## 2022-01-06 DIAGNOSIS — G8929 Other chronic pain: Secondary | ICD-10-CM

## 2022-01-06 DIAGNOSIS — M5442 Lumbago with sciatica, left side: Secondary | ICD-10-CM | POA: Diagnosis not present

## 2022-01-06 NOTE — Telephone Encounter (Signed)
Requested medication (s) are due for refill today - provider review   Requested medication (s) are on the active medication list -yes  Future visit scheduled -yes  Last refill: 06/29/21 #270 1RF  Notes to clinic: non delegated Rx  Requested Prescriptions  Pending Prescriptions Disp Refills   pregabalin (LYRICA) 150 MG capsule [Pharmacy Med Name: Pregabalin 150 MG Oral Capsule] 270 capsule 0    Sig: TAKE 2 CAPSULES BY MOUTH IN THE MORNING AND 1 AT NIGHT     Not Delegated - Neurology:  Anticonvulsants - Controlled - pregabalin Failed - 01/05/2022  4:07 PM      Failed - This refill cannot be delegated      Failed - Cr in normal range and within 360 days    Creat  Date Value Ref Range Status  06/25/2021 0.67 (L) 0.70 - 1.30 mg/dL Final         Passed - Completed PHQ-2 or PHQ-9 in the last 360 days      Passed - Valid encounter within last 12 months    Recent Outpatient Visits           1 month ago Type 2 diabetes mellitus with other specified complication, with long-term current use of insulin ([place])   [facility] [place], Devonne Doughty, DO   6 months ago Annual physical exam   [place], DO   10 months ago Pain and swelling of toe of left foot   [facility] Olin Hauser, DO   11 months ago DDD (degenerative disc disease), lumbar   [place], DO   1 year ago Chronic pain syndrome   [place], DO       Future Appointments             In 1 month [place], Devonne Doughty, DO [facility]               Requested Prescriptions  Pending Prescriptions Disp Refills   pregabalin (LYRICA) 150 MG capsule [Pharmacy Med Name: Pregabalin 150 MG Oral Capsule] 270 capsule 0    Sig: TAKE 2 CAPSULES BY MOUTH IN THE MORNING AND 1 AT NIGHT     Not Delegated - Neurology:  Anticonvulsants -  Controlled - pregabalin Failed - 01/05/2022  4:07 PM      Failed - This refill cannot be delegated      Failed - Cr in normal range and within 360 days    Creat  Date Value Ref Range Status  06/25/2021 0.67 (L) 0.70 - 1.30 mg/dL Final         Passed - Completed PHQ-2 or PHQ-9 in the last 360 days      Passed - Valid encounter within last 12 months    Recent Outpatient Visits           1 month ago Type 2 diabetes mellitus with other specified complication, with long-term current use of insulin [facility])   [place], DO   6 months ago Annual physical exam   [facility] Olin Hauser, DO   10 months ago Pain and swelling of toe of left foot   [place], DO   11 months ago DDD (degenerative disc disease), lumbar   [place], Devonne Doughty, DO   1 year ago  Chronic pain syndrome   [facility] [place], Devonne Doughty, DO       Future Appointments             In 1 month [place], Devonne Doughty, DO [facility]

## 2022-01-18 ENCOUNTER — Telehealth: Payer: Self-pay | Admitting: Podiatry

## 2022-01-18 DIAGNOSIS — M170 Bilateral primary osteoarthritis of knee: Secondary | ICD-10-CM | POA: Diagnosis not present

## 2022-01-18 DIAGNOSIS — E1142 Type 2 diabetes mellitus with diabetic polyneuropathy: Secondary | ICD-10-CM | POA: Diagnosis not present

## 2022-01-18 DIAGNOSIS — M47816 Spondylosis without myelopathy or radiculopathy, lumbar region: Secondary | ICD-10-CM | POA: Diagnosis not present

## 2022-01-18 NOTE — Telephone Encounter (Signed)
Left message on vm for patient to call back to schedule in [place] to be measure for diabetic shoes.

## 2022-01-20 ENCOUNTER — Other Ambulatory Visit: Payer: Medicare Other

## 2022-01-28 ENCOUNTER — Ambulatory Visit (INDEPENDENT_AMBULATORY_CARE_PROVIDER_SITE_OTHER): Payer: Medicare Other | Admitting: Podiatry

## 2022-01-28 DIAGNOSIS — M2142 Flat foot [pes planus] (acquired), left foot: Secondary | ICD-10-CM

## 2022-01-28 DIAGNOSIS — B351 Tinea unguium: Secondary | ICD-10-CM

## 2022-01-28 DIAGNOSIS — M79675 Pain in left toe(s): Secondary | ICD-10-CM

## 2022-01-28 DIAGNOSIS — M129 Arthropathy, unspecified: Secondary | ICD-10-CM

## 2022-01-28 DIAGNOSIS — M2141 Flat foot [pes planus] (acquired), right foot: Secondary | ICD-10-CM

## 2022-01-28 DIAGNOSIS — M79674 Pain in right toe(s): Secondary | ICD-10-CM

## 2022-01-28 DIAGNOSIS — E1142 Type 2 diabetes mellitus with diabetic polyneuropathy: Secondary | ICD-10-CM

## 2022-01-28 NOTE — Progress Notes (Signed)
This patient returns to my office for at risk foot care.  This patient requires this care by a professional since this patient will be at risk due to having diabetic neuropathy.  This patient is unable to cut nails himself since the patient cannot reach his nails.These nails are painful walking and wearing shoes.  This patient presents for at risk foot care today.  General Appearance  Alert, conversant and in no acute stress.  Vascular  Dorsalis pedis and posterior tibial  pulses are palpable  bilaterally.  Capillary return is within normal limits  bilaterally. Temperature is within normal limits  bilaterally.  Neurologic  Senn-Weinstein monofilament wire test diminished  bilaterally. Muscle power within normal limits bilaterally.  Nails Thick disfigured discolored nails with subungual debris  from hallux to fifth toes bilaterally. No evidence of bacterial infection or drainage bilaterally.  Orthopedic  No limitations of motion  feet .  No crepitus or effusions noted.  No bony pathology or digital deformities noted.  Pes planus  .  DJD 1st MPJ  B/L.  Skin  normotropic skin with no porokeratosis noted bilaterally.  No signs of infections or ulcers noted.     Onychomycosis  Pain in right toes  Pain in left toes  Consent was obtained for treatment procedures.   Mechanical debridement of nails 1-5  bilaterally performed with a nail nipper.  Filed with dremel without incident. 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  Patient to make an appointment with Aaron Edelman.   Return office visit   3 months                  Told patient to return for periodic foot care and evaluation due to potential at risk complications.   Gardiner Barefoot DPM

## 2022-02-03 NOTE — Progress Notes (Signed)
Patient presents to the office today for diabetic shoe and insole measuring.  Patient was measured with brannock device to determine size and width for 1 pair of extra depth shoes and foam casted for 3 pair of insoles.   ABN signed.   Documentation of medical necessity will be sent to patient's treating diabetic doctor to verify and sign.   Patient's diabetic provider: Olin Hauser, DO   Shoes and insoles will be ordered at that time and patient will be notified for an appointment for fitting when they arrive.   Brannock measurement: RIGHT/LEFT: 14 D  Patient shoe selection-   1st   Shoe choice:   Orthofeet 633  Shoe size ordered: Men's 14 X-Wide  Patient's feet swell

## 2022-02-04 ENCOUNTER — Ambulatory Visit: Payer: 59 | Admitting: *Deleted

## 2022-02-04 DIAGNOSIS — M2141 Flat foot [pes planus] (acquired), right foot: Secondary | ICD-10-CM

## 2022-02-04 DIAGNOSIS — E1142 Type 2 diabetes mellitus with diabetic polyneuropathy: Secondary | ICD-10-CM

## 2022-02-14 DIAGNOSIS — G894 Chronic pain syndrome: Secondary | ICD-10-CM | POA: Diagnosis not present

## 2022-02-14 DIAGNOSIS — M5136 Other intervertebral disc degeneration, lumbar region: Secondary | ICD-10-CM | POA: Diagnosis not present

## 2022-02-14 DIAGNOSIS — M150 Primary generalized (osteo)arthritis: Secondary | ICD-10-CM | POA: Diagnosis not present

## 2022-02-14 DIAGNOSIS — R296 Repeated falls: Secondary | ICD-10-CM | POA: Diagnosis not present

## 2022-02-14 NOTE — Progress Notes (Unsigned)
PROVIDER NOTE: Information contained herein reflects review and annotations entered in association with encounter. Interpretation of such information and data should be left to medically-trained personnel. Information provided to patient can be located elsewhere in the medical record under "Patient Instructions". Document created using STT-dictation technology, any transcriptional errors that may result from process are unintentional.    Patient: James [name]  Service Category: E/M  Provider: Gaspar Cola, MD  DOB: 1960-11-26  DOS: 02/16/2022  Referring Provider: Nobie Putnam *  MRN: [id number]  Specialty: Interventional Pain Management  PCP: Olin Hauser, DO  Type: Established Patient  Setting: Ambulatory outpatient    Location: Office  Delivery: Face-to-face     HPI  James [name], a 62 y.o. year old male, is here today because of his Chronic pain syndrome [G89.4]. James [name] primary complain today is No chief complaint on file. Last encounter: My last encounter with him was on 01/06/2022. Pertinent problems: James [name] has Chronic pain syndrome; DDD (degenerative disc disease), lumbar; Primary osteoarthritis involving multiple joints; Weakness of lower extremities (Bilateral); Chronic lower extremity pain (1ry area of Pain) (Bilateral); Chronic low back pain (4th area of Pain) (Bilateral) w/ sciatica (Bilateral); Chronic shoulder pain (5th area of Pain) (Left); Chronic hip pain (2ry area of Pain) (Bilateral); Chronic knee pain (3ry area of Pain) (Bilateral); Osteoarthritis of hips (Bilateral); Osteoarthritis of hip (Left); Osteoarthritis of hip (Right); Osteoarthritis of knee (Left); Lumbosacral radiculopathy at L5 (Left); Diabetic neuropathy ([place]); Osteoarthritis of AC (acromioclavicular) joint (Left); Osteoarthritis of shoulder (Left); Rotator cuff arthropathy of shoulder (Left); Lumbosacral facet hypertrophy (L4-5, L5-S1); Lumbar facet syndrome;  Abnormal MRI, hip (04/21/2021); 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); Abnormal EMG (electromyogram) (03/16/2021); Polyneuropathy, peripheral sensorimotor axonal; Diabetic sensorimotor polyneuropathy ([place]); Abnormal MRI, lumbar spine (05/21/2021); Chronic arthropathy; 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 Pain Assessment: Severity of   is reported as a  /10. Location:    / . Onset:  . Quality:  . Timing:  . Modifying factor(s):  Marland Kitchen Vitals:  vitals were not taken for this visit.  BMI: Estimated body mass index is 43.81 kg/m as calculated from the following:   Height as of 12/23/21: 6' (1.829 m).   Weight as of 12/23/21: 323 lb (146.5 kg).  Reason for encounter: medication management. ***  Routine UDS ordered today.   RTCB: 05/31/2022   Pharmacotherapy Assessment  Analgesic: Oxycodone/APAP 10/325 (# 60/month), 1 tab p.o. twice daily (last filled on 02/03/2021) Needs to loose an average of 2.75 lbs/mo to be considered for opioid pharmacotherapy.  MME/day: 30 mg/day   Monitoring: [place] PMP: PDMP reviewed during this encounter.       Pharmacotherapy: No side-effects or adverse reactions reported. Compliance: No problems identified. Effectiveness: Clinically acceptable.  No notes on file  No results found for: "CBDTHCR" No results found for: "D8THCCBX" No results found for: "D9THCCBX"  UDS:  Summary  Date Value Ref Range Status  02/15/2021 Note  Final    Comment:    ==================================================================== Compliance Drug Analysis,  [place] ==================================================================== Test                             Result  Flag       Units  Drug Present and Declared for Prescription Verification   Oxycodone                      3020         EXPECTED   ng/mg creat   Oxymorphone                    1094         EXPECTED   ng/mg creat   Noroxycodone                   3969         EXPECTED   ng/mg creat   Noroxymorphone                 265          EXPECTED   ng/mg creat    Sources of oxycodone are scheduled prescription medications.    Oxymorphone, noroxycodone, and noroxymorphone are expected    metabolites of oxycodone. Oxymorphone is also available as a    scheduled prescription medication.    Pregabalin                     PRESENT      EXPECTED   Acetaminophen                  PRESENT      EXPECTED ==================================================================== Test                      Result    Flag   Units      Ref Range   Creatinine              172              mg/dL      >=20 ==================================================================== Declared Medications:  The flagging and interpretation on this report are based on the  following declared medications.  Unexpected results may arise from  inaccuracies in the declared medications.   **Note: The testing scope of this panel includes these medications:   Oxycodone (Percocet)  Pregabalin (Lyrica)   **Note: The testing scope of this panel does not include small to  moderate amounts of these reported medications:   Acetaminophen (Percocet)   **Note: The testing scope of this panel does not include the  following reported medications:   Atorvastatin (Lipitor)  Hydrochlorothiazide (Hydrodiuril)  Insulin Tyler Aas)  Losartan (Cozaar)  Metformin (Glucophage)  Semaglutide (Ozempic) ==================================================================== For clinical consultation, please call [phone]. ====================================================================       ROS  Constitutional: Denies any fever or chills Gastrointestinal: No reported hemesis, hematochezia, vomiting, or acute GI distress Musculoskeletal: Denies any acute onset joint swelling, redness, loss of ROM, or weakness Neurological: No reported episodes of acute onset apraxia, aphasia, dysarthria, agnosia, amnesia, paralysis, loss of coordination, or loss of consciousness  Medication Review  Oxycodone HCl, Semaglutide (1 MG/DOSE), albuterol, atorvastatin, hydrochlorothiazide, losartan, metFORMIN, naloxone, and pregabalin  History Review  Allergy: James [name] has No Known Allergies. Drug: James [name]  reports no history of drug use. Alcohol:  reports no history of alcohol use. Tobacco:  reports that he has been smoking cigarettes. He has a 2.50 pack-year smoking history. He has never used smokeless tobacco. Social: James [name]  reports that he has been smoking cigarettes. He has a 2.50 pack-year smoking history. He has never used smokeless tobacco. He  reports that he does not drink alcohol and does not use drugs. Medical:  has a past medical history of Arthritis, Diabetes mellitus without complication ([place]), Hyperlipidemia, and Hypertension. Surgical: James [name]  has a past surgical history that includes Colonoscopy with propofol; Appendectomy; and Colonoscopy with propofol (N/A, 04/23/2021). Family: family history includes Arthritis in his father and mother; Heart disease in his mother.  Laboratory Chemistry Profile   Renal Lab Results  Component Value Date   BUN 12 06/25/2021   CREATININE 0.67 (L) 06/25/2021   BCR 18 06/25/2021   GFRNONAA >60 10/30/2020    Hepatic Lab Results  Component Value Date   AST 10 06/25/2021   ALT 8 (L) 06/25/2021   ALBUMIN 4.1 02/15/2021   ALKPHOS 116 02/15/2021    Electrolytes Lab Results  Component Value Date   NA 142 06/25/2021   K 4.1  06/25/2021   CL 100 06/25/2021   CALCIUM 9.0 06/25/2021   MG 1.8 02/15/2021    Bone Lab Results  Component Value Date   25OHVITD1 13 (L) 02/15/2021   25OHVITD2 <1.0 02/15/2021   25OHVITD3 12 02/15/2021    Inflammation (CRP: Acute Phase) (ESR: Chronic Phase) Lab Results  Component Value Date   CRP 29 (H) 02/15/2021   ESRSEDRATE 39 (H) 02/15/2021         Note: Above Lab results reviewed.  Recent Imaging Review  DG PAIN CLINIC C-ARM 1-60 MIN NO REPORT Fluoro was used, but no Radiologist interpretation will be provided.  Please refer to "NOTES" tab for provider progress note. Note: Reviewed        Physical Exam  General appearance: Well nourished, well developed, and well hydrated. In no apparent acute distress Mental status: Alert, oriented x 3 (person, place, & time)       Respiratory: No evidence of acute respiratory distress Eyes: PERLA Vitals: There were no vitals taken for this visit. BMI: Estimated body mass index is 43.81 kg/m as calculated from the following:   Height as of 12/23/21: 6' (1.829 m).   Weight as of 12/23/21: 323 lb (146.5 kg). Ideal: Patient weight not recorded  Assessment   Diagnosis Status  1. Chronic pain syndrome   2. Pharmacologic therapy   3. Chronic shoulder pain (5th area of Pain) (Left)   4. Chronic shoulder pain (3ry area of Pain) (Left)   5. Osteoarthritis of knee (Left)   6. Osteoarthritis of hip (Left)   7. Osteoarthritis of hip (Right)   8. Primary osteoarthritis of hips (Bilateral)   9. Encounter for medication management   10. Encounter for chronic pain management   11. Chronic use of opiate for therapeutic purpose   12. Chronic knee pain (3ry area of Pain) (Bilateral)   13. Chronic lower extremity pain (1ry area of Pain) (Bilateral)   14. Chronic low back pain (4th area of Pain) (Bilateral) w/ sciatica (Bilateral)   15. Chronic low back pain (2ry area of Pain) (Bilateral) w/ sciatica (Bilateral)   16. Chronic hip pain (2ry  area of Pain) (Bilateral)    Controlled Controlled Controlled   Updated Problems: No problems updated.  Plan of Care  Problem-specific:  No problem-specific Assessment & Plan notes found for this encounter.  James [name] has a current medication list which includes the following long-term medication(s): albuterol, atorvastatin, hydrochlorothiazide, losartan, metformin, oxycodone hcl, and pregabalin.  Pharmacotherapy (Medications Ordered): No orders of the defined types were placed in this encounter.  Orders:  No orders of the defined types were placed  in this encounter.  Follow-up plan:   No follow-ups on file.     Interventional Therapies  Risk  Complexity Considerations:   Estimated body mass index is 45.98 kg/m down from 50.97 kg/m as calculated from the following:   Height as of this encounter: 6' (1.829 m).   Weight as of this encounter: 339 lbs down from 375 lb 12.8 oz (170.5 kg). Our goal is to bring his BMI down to 30 kg/m (220 lbs for his height). He is supposed to lose 2.75 lbs/month. WNL   Planned  Pending:      Under consideration:   Diagnostic/therapeutic left IA AC + glenohumeral joint inj. #1  Diagnostic/therapeutic left suprascapular NB #1  Diagnostic/therapeutic bilateral IA hip inj. #1  Diagnostic/therapeutic bilateral IA steroid knee inj. #1  Therapeutic bilateral IA viscosupplementation knee inj. #1  Diagnostic/therapeutic bilateral lumbar facet MBB #1    Completed:   Diagnostic/therapeutic right L2-3 LESI x1 (12/23/2021) (100/100/80/LBP:40LEP:100)  Diagnostic/therapeutic right L3-4 LESI x1 (11/04/21) (100/100/25/25) (100% of LBP)  Diagnostic/therapeutic right L4-5 LESI x2 (11/30/2021) (100/100/100/80)  Toradol/Norflex IM 60/60 mg (03/29/2021)  Referral to orthopedic surgery for evaluation of knees and hip (03/29/2021)  Referral to medical weight management (02/15/2021)  Referral to bariatric surgery (02/15/2021)    Completed by  other providers:   [facility]  EMG/PNCV (LE) by Gurney Maxin, MD [facility]) (03/16/2021) (chronic, severe sensorimotor polyneuropathy)   Therapeutic  Palliative (PRN) options:   None established     Recent Visits Date Type Provider Dept  01/06/22 Office Visit Milinda Pointer, MD [facility]  12/23/21 Procedure visit Milinda Pointer, MD [facility]  12/14/21 Office Visit Milinda Pointer, MD [facility]  11/30/21 Procedure visit Milinda Pointer, MD [facility]  Showing recent visits within past 90 days and meeting all other requirements Future Appointments Date Type Provider Dept  02/16/22 Appointment Milinda Pointer, MD [facility]  Showing future appointments within next 90 days and meeting all other requirements  I discussed the assessment and treatment plan with the patient. The patient was provided an opportunity to ask questions and all were answered. The patient agreed with the plan and demonstrated an understanding of the instructions.  Patient advised to call back or seek an in-person evaluation if the symptoms or condition worsens.  Duration of encounter: *** minutes.  Total time on encounter, as per AMA guidelines included both the face-to-face and non-face-to-face time personally spent by the physician and/or other qualified health care professional(s) on the day of the encounter (includes time in activities that require the physician or other qualified health care professional and does not include time in activities normally performed by clinical staff). Physician's time may include the following activities when performed: Preparing to see the patient (e.g., pre-charting review of records, searching for previously ordered imaging, lab work, and nerve conduction tests) Review of prior analgesic pharmacotherapies. Reviewing PMP Interpreting ordered tests (e.g., lab work,  imaging, nerve conduction tests) Performing post-procedure evaluations, including interpretation of diagnostic procedures Obtaining and/or reviewing separately obtained history Performing a medically appropriate examination and/or evaluation Counseling and educating the patient/family/caregiver Ordering medications, tests, or procedures Referring and communicating with other health care professionals (when not separately reported) Documenting clinical information in the electronic or other health record Independently interpreting results (not separately reported) and communicating results to the patient/ family/caregiver Care coordination (not separately reported)  Note by: Gaspar Cola, MD Date: 02/16/2022; Time: 12:40 PM

## 2022-02-15 ENCOUNTER — Ambulatory Visit (INDEPENDENT_AMBULATORY_CARE_PROVIDER_SITE_OTHER): Payer: 59 | Admitting: Family Medicine

## 2022-02-15 ENCOUNTER — Encounter: Payer: Self-pay | Admitting: Family Medicine

## 2022-02-15 VITALS — BP 134/86 | HR 68 | Ht 72.0 in | Wt 323.0 lb

## 2022-02-15 DIAGNOSIS — R600 Localized edema: Secondary | ICD-10-CM | POA: Diagnosis not present

## 2022-02-15 DIAGNOSIS — E1169 Type 2 diabetes mellitus with other specified complication: Secondary | ICD-10-CM

## 2022-02-15 DIAGNOSIS — Z794 Long term (current) use of insulin: Secondary | ICD-10-CM | POA: Diagnosis not present

## 2022-02-15 DIAGNOSIS — E1142 Type 2 diabetes mellitus with diabetic polyneuropathy: Secondary | ICD-10-CM | POA: Diagnosis not present

## 2022-02-15 DIAGNOSIS — M47816 Spondylosis without myelopathy or radiculopathy, lumbar region: Secondary | ICD-10-CM

## 2022-02-15 DIAGNOSIS — Z6841 Body Mass Index (BMI) 40.0 and over, adult: Secondary | ICD-10-CM | POA: Insufficient documentation

## 2022-02-15 DIAGNOSIS — M170 Bilateral primary osteoarthritis of knee: Secondary | ICD-10-CM | POA: Diagnosis not present

## 2022-02-15 DIAGNOSIS — J4530 Mild persistent asthma, uncomplicated: Secondary | ICD-10-CM | POA: Diagnosis not present

## 2022-02-15 LAB — POCT GLYCOSYLATED HEMOGLOBIN (HGB A1C): Hemoglobin A1C: 6 % — AB (ref 4.0–5.6)

## 2022-02-15 MED ORDER — ALBUTEROL SULFATE (2.5 MG/3ML) 0.083% IN NEBU: 2.5000 mg | INHALATION_SOLUTION | Freq: Four times a day (QID) | RESPIRATORY_TRACT | 12 refills | Status: DC | PRN

## 2022-02-15 MED ORDER — FUROSEMIDE 20 MG PO TABS: 20.0000 mg | ORAL_TABLET | Freq: Every day | ORAL | 2 refills | Status: DC | PRN

## 2022-02-15 NOTE — Progress Notes (Signed)
Subjective:    Patient ID: James [name], male    DOB: [birth year], 62 y.o.   MRN: [id number]  James [name] is a 62 y.o. male presenting on 02/15/2022 for Diabetes and Leg Swelling   HPI  Type 2 Diabetes Due for A1c today 6.0 result. He has improved on Ozempic '1mg'$  weekly inj, Metformin '1000mg'$  TWICE A DAY CBG controlled Followed by Podiatry. Never got DIABETES shoes last year, needs new order. They have already ordered, and may require additional paperwork Diabetic neuropathy  He ordered Diabetic Shoes 02/04/22, awaiting on them to arrive now.  Asthma, persistent uncomplicated Obesity Lower Ext Swelling Morbid Obesity BMI >43  He requests referral to Bariatric Surgery.  Lumbar Facet Syndrome Chronic Back Pain  Admits swelling, wheezing, phlegm production Tried Mucinex. Worse in morning, asking about fluid medication, he is not on this at this time and has worse swelling in lower extremity Uses compression and difficulty with elevation due to weight  He needs nebulizer and albuterol solution again. Was on in the past.  Will order Lift Chair recliner to help elevation of legs.      11/30/2021    9:30 AM 11/15/2021    9:45 AM 11/04/2021    8:59 AM  Depression screen PHQ 2/9  Decreased Interest 0 0 0  Down, Depressed, Hopeless 0 0 0  PHQ - 2 Score 0 0 0    Social History   Tobacco Use   Smoking status: Some Days    Packs/day: 0.10    Years: 25.00    Total pack years: 2.50    Types: Cigarettes   Smokeless tobacco: Never  Vaping Use   Vaping Use: Never used  Substance Use Topics   Alcohol use: Never   Drug use: Never    Review of Systems Per HPI unless specifically indicated above     Objective:    BP 134/86   Pulse 68   Ht 6' (1.829 m)   Wt (!) 323 lb (146.5 kg)   SpO2 96%   BMI 43.81 kg/m   Wt Readings from Last 3 Encounters:  02/15/22 (!) 323 lb (146.5 kg)  12/23/21 (!) 323 lb (146.5 kg)  11/30/21 (!) 320 lb (145.2 kg)    Physical  Exam Vitals and nursing note reviewed.  Constitutional:      General: He is not in acute distress.    Appearance: He is well-developed. He is obese. He is not diaphoretic.     Comments: Well-appearing, comfortable, cooperative  HENT:     Head: Normocephalic and atraumatic.  Eyes:     General:        Right eye: No discharge.        Left eye: No discharge.     Conjunctiva/sclera: Conjunctivae normal.  Neck:     Thyroid: No thyromegaly.  Cardiovascular:     Rate and Rhythm: Normal rate and regular rhythm.     Pulses: Normal pulses.     Heart sounds: Normal heart sounds. No murmur heard. Pulmonary:     Effort: Pulmonary effort is normal. No respiratory distress.     Breath sounds: Normal breath sounds. No wheezing or rales.  Musculoskeletal:        General: Normal range of motion.     Cervical back: Normal range of motion and neck supple.     Right lower leg: Edema present.     Left lower leg: Edema present.     Comments: Electric wheelchair hoverround  Lymphadenopathy:  Cervical: No cervical adenopathy.  Skin:    General: Skin is warm and dry.     Findings: No erythema or rash.  Neurological:     Mental Status: He is alert and oriented to person, place, and time. Mental status is at baseline.  Psychiatric:        Behavior: Behavior normal.     Comments: Well groomed, good eye contact, normal speech and thoughts    Diabetic Foot Exam - Simple   Simple Foot Form Diabetic Foot exam was performed with the following findings: Yes 02/15/2022  1:08 PM  Visual Inspection See comments: Yes Sensation Testing See comments: Yes Pulse Check Posterior Tibialis and Dorsalis pulse intact bilaterally: Yes Comments Bilateral feet with callus formation heels and forefoot, no ulceration. Reduced monofilament sensation.     Recent Labs    06/25/21 0955 11/12/21 1014 02/15/22 1038  HGBA1C 6.0* 6.1* 6.0*     Results for orders placed or performed in visit on 02/15/22  POCT HgB  A1C  Result Value Ref Range   Hemoglobin A1C 6.0 (A) 4.0 - 5.6 %      Assessment & Plan:   Problem List Items Addressed This Visit     Diabetes mellitus ([place]) - Primary (Chronic)   Relevant Orders   POCT HgB A1C (Completed)   Lumbar facet syndrome (Chronic)   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) (Chronic)   Diabetic neuropathy ([facility])   Mild persistent asthma without complication   Relevant Medications   albuterol (PROVENTIL) (2.5 MG/3ML) 0.083% nebulizer solution   Other Relevant Orders   For home use only DME Nebulizer machine   Morbid obesity with BMI of 40.0-44.9, adult ([place])   Other Visit Diagnoses     Bilateral lower extremity edema       Relevant Medications   furosemide (LASIX) 20 MG tablet       Meds ordered this encounter  Medications   furosemide (LASIX) 20 MG tablet    Sig: Take 1 tablet (20 mg total) by mouth daily as needed for fluid or edema.    Dispense:  30 tablet    Refill:  2   albuterol (PROVENTIL) (2.5 MG/3ML) 0.083% nebulizer solution    Sig: Take 3 mLs (2.5 mg total) by nebulization 4 (four) times daily as needed for wheezing or shortness of breath.    Dispense:  450 mL    Refill:  12   DM2 Other complications, morbid obesity,  neuropathy A1c stable well controlled 6.0 today Continue Ozempic '1mg'$  weekly inj Future reconsider other options including Mounjaro if indicated for better wt loss   Chronic bilateral diabetic neuropathy in both feet, as complication of diabetes Additionally with bilateral thickened toenails. Evidence of callus formation both feet, heels and mid foot   Foot exam done by Podiatry previously   Plan - Proceed with ordering Diabetic Shoes, will await request for orders. - Patient would benefit from Diabetic Shoes due to neuropathy with callus formation and great toe deformity causing pain, diabetes control is improving on current regimen, and I am continuing to monitor and manage diabetes.  ----  Morbid  Obesity Lumbar Facet Chronic Back Pain LE Edema  Will trial diuretic course for AS NEEDED use only lasix   Check with Bariatric weight loss surgery in [facility] office, see if they are in your network, if they are, then we can proceed - Please notify me which location and we can refer if in network.  Asthma persistent Rx DME home nebulizer will  route chart to [facility] rep for review and orders. Rx Albuterol solution to [facility] will deliver the nebulizer machine for the breathing.   ----------  For the Lift Chair rx - take it to one of these stores to fit. Handwritten Lift Chair rx given  [facility]  [facility] [address] [place], [place]  [address] Ph: [phone] Fax: [place]. [address] [place], [place] [address] Ph: [phone] Fax: [phone]  [facility] [address] [place], [place] [address] Open until [place] Phone: [phone] Fax: [phone]   Follow up plan: Return in about 5 months (around 07/17/2022) for 5 month Annual Physical AM apt fasting lab AFTER.   Nobie Putnam, [facility] 02/15/2022, 10:34 AM

## 2022-02-15 NOTE — Patient Instructions (Addendum)
Thank you for coming to the office today.  Recent Labs    06/25/21 0955 11/12/21 1014 02/15/22 1038  HGBA1C 6.0* 6.1* 6.0*   --------------------------------------  Check with Bariatric weight loss surgery in [place] office, see if they are in your network, if they are, then we can proceed  [facility] will deliver the nebulizer machine for the breathing.  Start Fluid Pill as needed for swelling, do not take every day, just use as needed.   ----------  For the Lift Chair rx - take it to one of these stores to fit.  [facility]  [facility] [address] [place], [place]  [address] Ph: [phone] Fax: [place]. [address] [place], [place] [address] Ph: [phone] Fax: [phone]  [facility] [address] [place], [place] [address] Open until [place] Phone: [phone] Fax: [phone]  Please schedule a Follow-up Appointment to: Return in about 5 months (around 07/17/2022) for 5 month Annual Physical AM apt fasting lab AFTER.  If you have any other questions or concerns, please feel free to call the office or send a message through [facility]. You may also schedule an earlier appointment if necessary.  Additionally, you may be receiving a survey about your experience at our office within a few days to 1 week by e-mail or mail. We value your feedback.  Nobie Putnam, DO [place]

## 2022-02-16 ENCOUNTER — Ambulatory Visit: Payer: 59 | Attending: Pain Medicine | Admitting: Pain Medicine

## 2022-02-16 ENCOUNTER — Encounter: Payer: Self-pay | Admitting: Pain Medicine

## 2022-02-16 VITALS — BP 109/90 | HR 77 | Temp 97.3°F | Resp 18 | Ht 74.0 in | Wt 315.0 lb

## 2022-02-16 DIAGNOSIS — M25562 Pain in left knee: Secondary | ICD-10-CM | POA: Diagnosis not present

## 2022-02-16 DIAGNOSIS — M5442 Lumbago with sciatica, left side: Secondary | ICD-10-CM | POA: Diagnosis not present

## 2022-02-16 DIAGNOSIS — Z79891 Long term (current) use of opiate analgesic: Secondary | ICD-10-CM

## 2022-02-16 DIAGNOSIS — M5441 Lumbago with sciatica, right side: Secondary | ICD-10-CM | POA: Insufficient documentation

## 2022-02-16 DIAGNOSIS — M5136 Other intervertebral disc degeneration, lumbar region: Secondary | ICD-10-CM | POA: Diagnosis not present

## 2022-02-16 DIAGNOSIS — G894 Chronic pain syndrome: Secondary | ICD-10-CM

## 2022-02-16 DIAGNOSIS — M150 Primary generalized (osteo)arthritis: Secondary | ICD-10-CM

## 2022-02-16 DIAGNOSIS — M25551 Pain in right hip: Secondary | ICD-10-CM | POA: Insufficient documentation

## 2022-02-16 DIAGNOSIS — M79604 Pain in right leg: Secondary | ICD-10-CM | POA: Diagnosis not present

## 2022-02-16 DIAGNOSIS — M25512 Pain in left shoulder: Secondary | ICD-10-CM | POA: Diagnosis not present

## 2022-02-16 DIAGNOSIS — M1612 Unilateral primary osteoarthritis, left hip: Secondary | ICD-10-CM

## 2022-02-16 DIAGNOSIS — M25552 Pain in left hip: Secondary | ICD-10-CM | POA: Diagnosis not present

## 2022-02-16 DIAGNOSIS — M47816 Spondylosis without myelopathy or radiculopathy, lumbar region: Secondary | ICD-10-CM

## 2022-02-16 DIAGNOSIS — Z79899 Other long term (current) drug therapy: Secondary | ICD-10-CM

## 2022-02-16 DIAGNOSIS — M79605 Pain in left leg: Secondary | ICD-10-CM | POA: Insufficient documentation

## 2022-02-16 DIAGNOSIS — M25561 Pain in right knee: Secondary | ICD-10-CM | POA: Insufficient documentation

## 2022-02-16 DIAGNOSIS — G8929 Other chronic pain: Secondary | ICD-10-CM

## 2022-02-16 DIAGNOSIS — M159 Polyosteoarthritis, unspecified: Secondary | ICD-10-CM | POA: Diagnosis not present

## 2022-02-16 DIAGNOSIS — M1712 Unilateral primary osteoarthritis, left knee: Secondary | ICD-10-CM

## 2022-02-16 DIAGNOSIS — M160 Bilateral primary osteoarthritis of hip: Secondary | ICD-10-CM

## 2022-02-16 DIAGNOSIS — M51369 Other intervertebral disc degeneration, lumbar region without mention of lumbar back pain or lower extremity pain: Secondary | ICD-10-CM

## 2022-02-16 DIAGNOSIS — M1611 Unilateral primary osteoarthritis, right hip: Secondary | ICD-10-CM

## 2022-02-16 DIAGNOSIS — M129 Arthropathy, unspecified: Secondary | ICD-10-CM

## 2022-02-16 MED ORDER — OXYCODONE HCL 10 MG PO TABS: 10.0000 mg | ORAL_TABLET | Freq: Two times a day (BID) | ORAL | 0 refills | Status: DC

## 2022-02-16 NOTE — Progress Notes (Signed)
Nursing Pain Medication Assessment:  Safety precautions to be maintained throughout the outpatient stay will include: orient to surroundings, keep bed in low position, maintain call bell within reach at all times, provide assistance with transfer out of bed and ambulation.  Medication Inspection Compliance: Pill count conducted under aseptic conditions, in front of the patient. Neither the pills nor the bottle was removed from the patient's sight at any time. Once count was completed pills were immediately returned to the patient in their original bottle.  Medication: Oxycodone IR Pill/Patch Count:  17 of 60 pills remain Pill/Patch Appearance: Markings consistent with prescribed medication Bottle Appearance: Standard pharmacy container. Clearly labeled. Filled Date: 01 / 15 / 2024 Last Medication intake:  Today

## 2022-02-16 NOTE — Patient Instructions (Addendum)
______________________________________________________________________  Procedure instructions  Do not eat or drink fluids (other than water) for 6 hours before your procedure  No water for 2 hours before your procedure  Take your blood pressure medicine with a sip of water  Arrive 30 minutes before your appointment  Carefully read the "Preparing for your procedure" detailed instructions  If you have questions call us at [phone]  _____________________________________________________________________    ______________________________________________________________________  Preparing for your procedure  During your procedure appointment there will be: No Prescription Refills. No disability issues to discussed. No medication changes or discussions.  Instructions: Food intake: Avoid eating anything solid for at least 8 hours prior to your procedure. Clear liquid intake: You may take clear liquids such as water up to 2 hours prior to your procedure. (No carbonated drinks. No soda.) Transportation: Unless otherwise stated by your physician, bring a driver. Morning Medicines: Except for blood thinners, take all of your other morning medications with a sip of water. Make sure to take your heart and blood pressure medicines. If your blood pressure's lower number is above 100, the case will be rescheduled. Blood thinners: Make sure to stop your blood thinners as instructed.  If you take a blood thinner, but were not instructed to stop it, call our office [phone] and ask to talk to a nurse. Not stopping a blood thinner prior to certain procedures could lead to serious complications. Diabetics on insulin: Notify the staff so that you can be scheduled 1st case in the morning. If your diabetes requires high dose insulin, take only  of your normal insulin dose the morning of the procedure and notify the staff that you have done so. Preventing infections: Shower with an  antibacterial soap the morning of your procedure.  Build-up your immune system: Take 1000 mg of Vitamin C with every meal (3 times a day) the day prior to your procedure. Antibiotics: Inform the nursing staff if you are taking any antibiotics or if you have any conditions that may require antibiotics prior to procedures. (Example: recent joint implants)   Pregnancy: If you are pregnant make sure to notify the nursing staff. Not doing so may result in injury to the fetus, including death.  Sickness: If you have a cold, fever, or any active infections, call and cancel or reschedule your procedure. Receiving steroids while having an infection may result in complications. Arrival: You must be in the facility at least 30 minutes prior to your scheduled procedure. Tardiness: Your scheduled time is also the cutoff time. If you do not arrive at least 15 minutes prior to your procedure, you will be rescheduled.  Children: Do not bring any children with you. Make arrangements to keep them home. Dress appropriately: There is always a possibility that your clothing may get soiled. Avoid long dresses. Valuables: Do not bring any jewelry or valuables.  Reasons to call and reschedule or cancel your procedure: (Following these recommendations will minimize the risk of a serious complication.) Surgeries: Avoid having procedures within 2 weeks of any surgery. (Avoid for 2 weeks before or after any surgery). Flu Shots: Avoid having procedures within 2 weeks of a flu shots or . (Avoid for 2 weeks before or after immunizations). Barium: Avoid having a procedure within 7-10 days after having had a radiological study involving the use of radiological contrast. (Myelograms, Barium swallow or enema study). Heart attacks: Avoid any elective procedures or surgeries for the initial 6 months after a "Myocardial Infarction" (Heart Attack). Blood thinners: It  is imperative that you stop these medications before procedures. Let us  know if you if you take any blood thinner.  Infection: Avoid procedures during or within two weeks of an infection (including chest colds or gastrointestinal problems). Symptoms associated with infections include: Localized redness, fever, chills, night sweats or profuse sweating, burning sensation when voiding, cough, congestion, stuffiness, runny nose, sore throat, diarrhea, nausea, vomiting, cold or Flu symptoms, recent or current infections. It is specially important if the infection is over the area that we intend to treat. Heart and lung problems: Symptoms that may suggest an active cardiopulmonary problem include: cough, chest pain, breathing difficulties or shortness of breath, dizziness, ankle swelling, uncontrolled high or unusually low blood pressure, and/or palpitations. If you are experiencing any of these symptoms, cancel your procedure and contact your primary care physician for an evaluation.  Remember:  Regular Business hours are:  Monday to Thursday 8:00 AM to 4:00 PM  Provider's Schedule: Milinda Pointer, MD:  Procedure days: Tuesday and Thursday 7:30 AM to 4:00 PM  Gillis Santa, MD:  Procedure days: Monday and Wednesday 7:30 AM to 4:00 PM  ______________________________________________________________________    ____________________________________________________________________________________________  General Risks and Possible Complications  Patient Responsibilities: It is important that you read this as it is part of your informed consent. It is our duty to inform you of the risks and possible complications associated with treatments offered to you. It is your responsibility as a patient to read this and to ask questions about anything that is not clear or that you believe was not covered in this document.  Patient's Rights: You have the right to refuse treatment. You also have the right to change your mind, even after initially having agreed to have the treatment  done. However, under this last option, if you wait until the last second to change your mind, you may be charged for the materials used up to that point.  Introduction: Medicine is not an Chief Strategy Officer. Everything in Medicine, including the lack of treatment(s), carries the potential for danger, harm, or loss (which is by definition: Risk). In Medicine, a complication is a secondary problem, condition, or disease that can aggravate an already existing one. All treatments carry the risk of possible complications. The fact that a side effects or complications occurs, does not imply that the treatment was conducted incorrectly. It must be clearly understood that these can happen even when everything is done following the highest safety standards.  No treatment: You can choose not to proceed with the proposed treatment alternative. The "PRO(s)" would include: avoiding the risk of complications associated with the therapy. The "CON(s)" would include: not getting any of the treatment benefits. These benefits fall under one of three categories: diagnostic; therapeutic; and/or palliative. Diagnostic benefits include: getting information which can ultimately lead to improvement of the disease or symptom(s). Therapeutic benefits are those associated with the successful treatment of the disease. Finally, palliative benefits are those related to the decrease of the primary symptoms, without necessarily curing the condition (example: decreasing the pain from a flare-up of a chronic condition, such as incurable terminal cancer).  General Risks and Complications: These are associated to most interventional treatments. They can occur alone, or in combination. They fall under one of the following six (6) categories: no benefit or worsening of symptoms; bleeding; infection; nerve damage; allergic reactions; and/or death. No benefits or worsening of symptoms: In Medicine there are no guarantees, only probabilities. No  healthcare provider can ever guarantee that a  medical treatment will work, they can only state the probability that it may. Furthermore, there is always the possibility that the condition may worsen, either directly, or indirectly, as a consequence of the treatment. Bleeding: This is more common if the patient is taking a blood thinner, either prescription or over the counter (example: Goody Powders, Fish oil, Aspirin, Garlic, etc.), or if suffering a condition associated with impaired coagulation (example: Hemophilia, cirrhosis of the liver, low platelet counts, etc.). However, even if you do not have one on these, it can still happen. If you have any of these conditions, or take one of these drugs, make sure to notify your treating physician. Infection: This is more common in patients with a compromised immune system, either due to disease (example: diabetes, cancer, human immunodeficiency virus [HIV], etc.), or due to medications or treatments (example: therapies used to treat cancer and rheumatological diseases). However, even if you do not have one on these, it can still happen. If you have any of these conditions, or take one of these drugs, make sure to notify your treating physician. Nerve Damage: This is more common when the treatment is an invasive one, but it can also happen with the use of medications, such as those used in the treatment of cancer. The damage can occur to small secondary nerves, or to large primary ones, such as those in the spinal cord and brain. This damage may be temporary or permanent and it may lead to impairments that can range from temporary numbness to permanent paralysis and/or brain death. Allergic Reactions: Any time a substance or material comes in contact with our body, there is the possibility of an allergic reaction. These can range from a mild skin rash (contact dermatitis) to a severe systemic reaction (anaphylactic reaction), which can result in death. Death: In  general, any medical intervention can result in death, most of the time due to an unforeseen complication. ____________________________________________________________________________________________    _________________________________________________________________________________________  Body mass index (BMI)  Body mass index (BMI) is a common tool for deciding whether a person has an appropriate body weight.  It measures a persons weight in relation to their height.   According to the [facility] of health (NIH): A BMI of less than 18.5 means that a person is underweight. A BMI of between 18.5 and 24.9 is ideal. A BMI of between 25 and 29.9 is overweight. A BMI over 30 indicates obesity.  Weight Management Required  URGENT: Your weight has been found to be adversely affecting your health.  Dear James [name]:  Your current Estimated body mass index is 40.44 kg/m as calculated from the following:   Height as of this encounter: '6\' 2"'$  (1.88 m).   Weight as of this encounter: 315 lb (142.9 kg).  Please use the table below to identify your weight category and associated incidence of chronic pain, secondary to your weight.  Body Mass Index (BMI) Classification BMI level (kg/m2) Category Associated incidence of chronic pain  <18  Underweight   18.5-24.9 Ideal body weight   25-29.9 Overweight  20%  30-34.9 Obese (Class I)  68%  35-39.9 Severe obesity (Class II)  136%  >40 Extreme obesity (Class III)  254%   In addition: You will be considered "Morbidly Obese", if your BMI is above 30 and you have one or more of the following conditions which are known to be caused and/or directly associated with obesity: 1.    Type 2 Diabetes (Which in turn can lead to cardiovascular  diseases (CVD), stroke, peripheral vascular diseases (PVD), retinopathy, nephropathy, and neuropathy) 2.    Cardiovascular Disease (High Blood Pressure; Congestive Heart Failure; High Cholesterol; Coronary  Artery Disease; Angina; or History of Heart Attacks) 3.    Breathing problems (Asthma; obesity-hypoventilation syndrome; obstructive sleep apnea; chronic inflammatory airway disease; reactive airway disease; or shortness of breath) 4.    Chronic kidney disease 5.    Liver disease (nonalcoholic fatty liver disease) 6.    High blood pressure 7.    Acid reflux (gastroesophageal reflux disease; heartburn) 8.    Osteoarthritis (OA) (with any of the following: hip pain; knee pain; and/or low back pain) 9.    Low back pain (Lumbar Facet Syndrome; and/or Degenerative Disc Disease) 10.  Hip pain (Osteoarthritis of hip) (For every 1 lbs of added body weight, there is a 2 lbs increase in pressure inside of each hip articulation. 1:2 mechanical relationship) 11.  Knee pain (Osteoarthritis of knee) (For every 1 lbs of added body weight, there is a 4 lbs increase in pressure inside of each knee articulation. 1:4 mechanical relationship) (patients with a BMI>30 kg/m2 were 6.8 times more likely to develop knee OA than normal-weight individuals) 12.  Cancer: Epidemiological studies have shown that obesity is a risk factor for: post-menopausal breast cancer; cancers of the endometrium, colon and kidney cancer; malignant adenomas of the oesophagus. Obese subjects have an approximately 1.5-3.5-fold increased risk of developing these cancers compared with normal-weight subjects, and it has been estimated that between 15 and 45% of these cancers can be attributed to overweight. More recent studies suggest that obesity may also increase the risk of other types of cancer, including pancreatic, hepatic and gallbladder cancer. (Ref: Obesity and cancer. Pischon T, Nthlings U, [employer] H. Proc Nutr Soc. 2008 May;67(2):128-45. doi: 68.3419/Q2229798921194174.) The International Agency for Research on Cancer (IARC) has identified 13 cancers associated with overweight and obesity: meningioma, multiple myeloma, adenocarcinoma of the  esophagus, and cancers of the thyroid, postmenopausal breast cancer, gallbladder, stomach, liver, pancreas, kidney, ovaries, uterus, colon and rectal (colorectal) cancers. 21 percent of all cancers diagnosed in women and 24 percent of those diagnosed in men are associated with overweight and obesity.  Recommendation: At this point it is urgent that you take a step back and concentrate in loosing weight. Dedicate 100% of your efforts on this task. Nothing else will improve your health more than bringing your weight down and your BMI to less than 30. If you are here, you probably have chronic pain. We know that most chronic pain patients have difficulty exercising secondary to their pain. For this reason, you must rely on proper nutrition and diet in order to lose the weight. If your BMI is above 40, you should seriously consider bariatric surgery. A realistic goal is to lose 10% of your body weight over a period of 12 months.  Be honest to yourself, if over time you have unsuccessfully tried to lose weight, then it is time for you to seek professional help and to enter a medically supervised weight management program, and/or undergo bariatric surgery. Stop procrastinating.   Pain management considerations:  1.    Pharmacological Problems: Be advised that the use of opioid analgesics (oxycodone; hydrocodone; morphine; methadone; codeine; and all of their derivatives) have been associated with decreased metabolism and weight gain.  For this reason, should we see that you are unable to lose weight while taking these medications, it may become necessary for [place] to taper down and indefinitely discontinue them.  2.    Technical Problems: The incidence of successful interventional therapies decreases as the patient's BMI increases. It is much more difficult to accomplish a safe and effective interventional therapy on a patient with a BMI above 35. 3.    Radiation Exposure Problems: The x-rays machine, used to  accomplish injection therapies, will automatically increase their x-ray output in order to capture an appropriate bone image. This means that radiation exposure increases exponentially with the patient's BMI. (The higher the BMI, the higher the radiation exposure.) Although the level of radiation used at a given time is still safe to the patient, it is not for the physician and/or assisting staff. Unfortunately, radiation exposure is accumulative. Because physicians and the staff have to do procedures and be exposed on a daily basis, this can result in health problems such as cancer and radiation burns. Radiation exposure to the staff is monitored by the radiation batches that they wear. The exposure levels are reported back to the staff on a quarterly basis. Depending on levels of exposure, physicians and staff may be obligated by law to decrease this exposure. This means that they have the right and obligation to refuse providing therapies where they may be overexposed to radiation. For this reason, physicians may decline to offer therapies such as radiofrequency ablation or implants to patients with a BMI above 40. 4.    Current Trends: Be advised that the current trend is to no longer offer certain therapies to patients with a BMI equal to, or above 35, due to increase perioperative risks, increased technical procedural difficulties, and excessive radiation exposure to healthcare personnel.  _________________________________________________________________________________________   ____________________________________________________________________________________________  Opioid Pain Medication Update  To: All patients taking opioid pain medications. (I.e.: hydrocodone, hydromorphone, oxycodone, oxymorphone, morphine, codeine, methadone, tapentadol, tramadol, buprenorphine, fentanyl, etc.)  Re: Updated review of side effects and adverse reactions of opioid analgesics, as well as new information  about long term effects of this class of medications.  Direct risks of long-term opioid therapy are not limited to opioid addiction and overdose. Potential medical risks include serious fractures, breathing problems during sleep, hyperalgesia, immunosuppression, chronic constipation, bowel obstruction, myocardial infarction, and tooth decay secondary to xerostomia.  Unpredictable adverse effects that can occur even if you take your medication correctly: Cognitive impairment, respiratory depression, and death. Most people think that if they take their medication "correctly", and "as instructed", that they will be safe. Nothing could be farther from the truth. In reality, a significant amount of recorded deaths associated with the use of opioids has occurred in individuals that had taken the medication for a long time, and were taking their medication correctly. The following are examples of how this can happen: Patient taking his/her medication for a long time, as instructed, without any side effects, is given a certain antibiotic or another unrelated medication, which in turn triggers a "Drug-to-drug interaction" leading to disorientation, cognitive impairment, impaired reflexes, respiratory depression or an untoward event leading to serious bodily harm or injury, including death.  Patient taking his/her medication for a long time, as instructed, without any side effects, develops an acute impairment of liver and/or kidney function. This will lead to a rapid inability of the body to breakdown and eliminate their pain medication, which will result in effects similar to an "overdose", but with the same medicine and dose that they had always taken. This again may lead to disorientation, cognitive impairment, impaired reflexes, respiratory depression or an untoward event leading to serious bodily harm or injury,  including death.  A similar problem will occur with patients as they grow older and their liver and  kidney function begins to decrease as part of the aging process.  Background information: Historically, the original case for using long-term opioid therapy to treat chronic noncancer pain was based on safety assumptions that subsequent experience has called into question. In 1996, the American Pain Society and the [place] [employer] issued a consensus statement supporting long-term opioid therapy. This statement acknowledged the dangers of opioid prescribing but concluded that the risk for addiction was low; respiratory depression induced by opioids was short-lived, occurred mainly in opioid-naive patients, and was antagonized by pain; tolerance was not a common problem; and efforts to control diversion should not constrain opioid prescribing. This has now proven to be wrong. Experience regarding the risks for opioid addiction, misuse, and overdose in community practice has failed to support these assumptions.  According to the Centers for Disease Control and Prevention, fatal overdoses involving opioid analgesics have increased sharply over the past decade. Currently, more than 96,700 people die from drug overdoses every year. Opioids are a factor in 7 out of every 10 overdose deaths. Deaths from drug overdose have surpassed motor vehicle accidents as the leading cause of death for individuals between the ages of 57 and 47.  Clinical data suggest that neuroendocrine dysfunction may be very common in both men and women, potentially causing hypogonadism, erectile dysfunction, infertility, decreased libido, osteoporosis, and depression. Recent studies linked higher opioid dose to increased opioid-related mortality. Controlled observational studies reported that long-term opioid therapy may be associated with increased risk for cardiovascular events. Subsequent meta-analysis concluded that the safety of long-term opioid therapy in elderly patients has not been proven.   Side Effects and adverse  reactions: Common side effects: Drowsiness (sedation). Dizziness. Nausea and vomiting. Constipation. Physical dependence -- Dependence often manifests with withdrawal symptoms when opioids are discontinued or decreased. Tolerance -- As you take repeated doses of opioids, you require increased medication to experience the same effect of pain relief. Respiratory depression -- This can occur in healthy people, especially with higher doses. However, people with COPD, asthma or other lung conditions may be even more susceptible to fatal respiratory impairment.  Uncommon side effects: An increased sensitivity to feeling pain and extreme response to pain (hyperalgesia). Chronic use of opioids can lead to this. Delayed gastric emptying (the process by which the contents of your stomach are moved into your small intestine). Muscle rigidity. Immune system and hormonal dysfunction. Quick, involuntary muscle jerks (myoclonus). Arrhythmia. Itchy skin (pruritus). Dry mouth (xerostomia).  Long-term side effects: Chronic constipation. Sleep-disordered breathing (SDB). Increased risk of bone fractures. Hypothalamic-pituitary-adrenal dysregulation. Increased risk of overdose.  RISKS: Fractures and Falls:  Opioids increase the risk and incidence of falls. This is of particular importance in elderly patients.  Endocrine System:  Long-term administration is associated with endocrine abnormalities. Influences on both the hypothalamic-pituitary-adrenal axis?and the hypothalamic-pituitary-gonadal axis have been demonstrated with consequent hypogonadism and adrenal insufficiency in both sexes. Hypogonadism and decreased levels of dehydroepiandrosterone sulfate have been reported in men and women. Endocrine effects can lead to: Amenorrhoea in women Reduced libido in both sexes Erectile dysfunction in men Infertility Depression and fatigue Patients (particularly women of childbearing age) should avoid  opioids. There is insufficient evidence to recommend routine monitoring of asymptomatic patients taking opioids in the long-term for hormonal deficiencies.  Immune System: Human studies have demonstrated that opioids have an immunomodulating effect. These effects are mediated via opioid receptors  both on immune effector cells and in the central nervous system. Opioids have been demonstrated to have adverse effects on antimicrobial response and anti-tumour surveillance. Buprenorphine has been demonstrated to have no impact on immune function.  Opioid Induced Hyperalgesia: Human studies have demonstrated that prolonged use of opioids can lead to a state of abnormal pain sensitivity, sometimes called opioid induced hyperalgesia (OIH). Opioid induced hyperalgesia is not usually seen in the absence of tolerance to opioid analgesia. Clinically, hyperalgesia may be diagnosed if the patient on long-term opioid therapy presents with increased pain. This might be qualitatively and anatomically distinct from pain related to disease progression or to breakthrough pain resulting from development of opioid tolerance. Pain associated with hyperalgesia tends to be more diffuse than the pre-existing pain and less defined in quality. Management of opioid induced hyperalgesia requires opioid dose reduction.  Cancer: Chronic opioid therapy has been associated with an increased risk of cancer among noncancer patients with chronic pain. This association was more evident in chronic strong opioid users. Chronic opioid consumption causes significant pathological changes in the small intestine and colon. Epidemiological studies have found that there is a link between opium dependence and initiation of gastrointestinal cancers. Cancer is the second leading cause of death after cardiovascular disease. Chronic use of opioids can cause multiple conditions such as GERD, immunosuppression and renal damage as well as carcinogenic  effects, which are associated with the incidence of cancers.   Mortality: Long-term opioid use has been associated with increased mortality among patients with chronic non-cancer pain (CNCP).  Prescription of long-acting opioids for chronic noncancer pain was associated with a significantly increased risk of all-cause mortality, including deaths from causes other than overdose.  Reference: Von Korff M, Kolodny A, Deyo RA, Chou R. Long-term opioid therapy reconsidered. Ann Intern Med. 2011 Sep 6;155(5):325-8. doi: 10.7326/[id number]. PMID: 50539767; PMCID: [id number]. Morley Kos, Hayward RA, Dunn KM, [place] KP. Risk of adverse events in patients prescribed long-term opioids: A cohort study in the [place] Clinical Practice Research D[employer]. Eur J Pain. 2019 May;23(5):908-922. doi: 10.1002/ejp.1357. Epub 2019 Jan 31. PMID: 09735329. Colameco S, Coren JS, Ciervo CA. Continuous opioid treatment for chronic noncancer pain: a time for moderation in prescribing. Postgrad Med. 2009 Jul;121(4):61-6. doi: 10.3810/pgm.2009.07.2032. PMID: 92426834. Heywood Bene RN, [place], Blazina I, Rosalio Loud, Bougatsos C, Deyo RA. The effectiveness and risks of long-term opioid therapy for chronic pain: a systematic review for a [employer] of Health Pathways to [employer]. Ann Intern Med. 2015 Feb 17;162(4):276-86. doi: 19.6222/L79-8921. PMID: 19417408. Marjory Sneddon [facility], [place] DM. NCHS Data Brief No. 22. [place]: Centers for Disease Control and Prevention; 2009. Sep, Increase in Fatal Poisonings Involving Opioid Analgesics in the [place], 1999-2006. Song IA, Choi HR, Oh TK. Long-term opioid use and mortality in patients with chronic non-cancer pain: Ten-year follow-up study in [place] from 2010 through 2019. EClinicalMedicine. 2022 Jul 18;51:101558. doi: 10.1016/j.eclinm.2022.144818. PMID: 56314970; PMCID: [id number]. Huser, W., Schubert,  T., Vogelmann, T. et al. All-cause mortality in patients with long-term opioid therapy compared with non-opioid analgesics for chronic non-cancer pain: a database study. [place] Med 18, 162 (2020). [email or web address].west.com/ Rashidian H, Roxy Cedar, Malekzadeh R, Haghdoost AA. An Ecological Study of the Association between Opiate Use and Incidence of Cancers. Addict Health. 2016 Fall;8(4):252-260. PMID: 27741287; PMCID: [id number].  Our Goal: Our goal is to control your pain with means other than the use of opioid pain medications.  Our Recommendation: Talk to your  physician about coming off of these medications. We can assist you with the tapering down and stopping these medicines. Based on the new information, even if you cannot completely stop the medication, a decrease in the dose may be associated with a lesser risk. Ask for other means of controlling the pain. Decrease or eliminate those factors that significantly contribute to your pain such as smoking, obesity, and a diet heavily tilted towards "inflammatory" nutrients.  ____________________________________________________________________________________________     ____________________________________________________________________________________________  Carron Brazen Pain Medication Shortage  The [place] is experiencing worsening drug shortages. These have had a negative widespread effect on patient care and treatment. Not expected to improve any time soon. Predicted to last past 2029.   Drug shortage list (generic names) Oxycodone IR Oxycodone/APAP Oxymorphone IR Hydromorphone Hydrocodone/APAP Morphine  Where is the problem?  Manufacturing and supply level.  Will this shortage affect you?  Only if you take any of the above pain medications.  How? You may be unable to fill your prescription.  Your pharmacist may offer a "partial fill" of your prescription. (Warning: Do not accept partial fills.)  Prescriptions partially filled cannot be transferred to another pharmacy. Read our Medication Rules and Regulation. Depending on how much medicine you are dependent on, you may experience withdrawals when unable to get the medication.  Recommendations: Consider ending your dependence on opioid pain medications. Ask your pain specialist to assist you with the process. Consider switching to a medication currently not in shortage, such as Buprenorphine. Talk to your pain specialist about this option. Consider decreasing your pain medication requirements by managing tolerance thru "Drug Holidays". This may help minimize withdrawals, should you run out of medicine. Control your pain thru the use of non-pharmacological interventional therapies.   Your prescriber: Prescribers cannot be blamed for shortages. Medication manufacturing and supply issues cannot be fixed by the prescriber.   NOTE: The prescriber is not responsible for supplying the medication, or solving supply issues. Work with your pharmacist to solve it. The patient is responsible for the decision to take or continue taking the medication and for identifying and securing a legal supply source. By law, supplying the medication is the job and responsibility of the pharmacy. The prescriber is responsible for the evaluation, monitoring, and prescribing of these medications.   Prescribers will NOT: Re-issue prescriptions that have been partially filled. Re-issue prescriptions already sent to a pharmacy.  Re-send prescriptions to a different pharmacy because yours did not have your medication. Ask pharmacist to order more medicine or transfer the prescription to another pharmacy. (Read below.)  New 2023 regulation: "September 17, 2021 Revised Regulation Allows DEA-Registered Pharmacies to Transfer Electronic Prescriptions at a Patient's Request [place] Patients now have the ability to request  their electronic prescription be transferred to another pharmacy without having to go back to their practitioner to initiate the request. This revised regulation went into effect on Monday, September 13, 2021.     At a patient's request, a DEA-registered retail pharmacy can now transfer an electronic prescription for a controlled substance (schedules II-V) to another DEA-registered retail pharmacy. Prior to this change, patients would have to go through their practitioner to cancel their prescription and have it re-issued to a different pharmacy. The process was taxing and time consuming for both patients and practitioners.    The Drug Enforcement Administration [facility]) published its intent to revise the process for transferring electronic prescriptions on December 06, 2019.  The final rule was published in the federal  register on August 12, 2021 and went into effect 30 days later.  Under the final rule, a prescription can only be transferred once between pharmacies, and only if allowed under existing state or other applicable law. The prescription must remain in its electronic form; may not be altered in any way; and the transfer must be communicated directly between two licensed pharmacists. It's important to note, any authorized refills transfer with the original prescription, which means the entire prescription will be filled at the same pharmacy".  Reference: [employer]at [facility] website announcement)  [email or web address].pdf ([place])   [employer] / Vol. 88, No. 143 / Thursday, August 12, 2021 / Rules and Regulations DEPARTMENT OF JUSTICE  Drug Enforcement Administration  21 CFR Part 1306  [Docket No. [id number]]  RIN [id number] Transfer of Electronic Prescriptions for Schedules II-V Controlled Substances Between Pharmacies for  Initial Filling  ____________________________________________________________________________________________     _______________________________________________________________________  Medication Rules  Purpose: To inform patients, and their family members, of our medication rules and regulations.  Applies to: All patients receiving prescriptions from our practice (written or electronic).  Pharmacy of record: This is the pharmacy where your electronic prescriptions will be sent. Make sure we have the correct one.  Electronic prescriptions: In compliance with the [place] (STOP) Act of 2017 (Session Lanny Cramp [phone]), effective January 17, 2018, all controlled substances must be electronically prescribed. Written prescriptions, faxing, or calling prescriptions to a pharmacy will no longer be done.  Prescription refills: These will be provided only during in-person appointments. No medications will be renewed without a "face-to-face" evaluation with your provider. Applies to all prescriptions.  NOTE: The following applies primarily to controlled substances (Opioid* Pain Medications).   Type of encounter (visit): For patients receiving controlled substances, face-to-face visits are required. (Not an option and not up to the patient.)  Patient's responsibilities: Pain Pills: Bring all pain pills to every appointment (except for procedure appointments). Pill Bottles: Bring pills in original pharmacy bottle. Bring bottle, even if empty. Always bring the bottle of the most recent fill.  Medication refills: You are responsible for knowing and keeping track of what medications you are taking and when is it that you will need a refill. The day before your appointment: write a list of all prescriptions that need to be refilled. The day of the appointment: give the list to the admitting nurse. Prescriptions will be written only during appointments. No  prescriptions will be written on procedure days. If you forget a medication: it will not be "Called in", "Faxed", or "electronically sent". You will need to get another appointment to get these prescribed. No early refills. Do not call asking to have your prescription filled early. Partial  or short prescriptions: Occasionally your pharmacy may not have enough pills to fill your prescription.  NEVER ACCEPT a partial fill or a prescription that is short of the total amount of pills that you were prescribed.  With controlled substances the law allows 72 hours for the pharmacy to complete the prescription.  If the prescription is not completed within 72 hours, the pharmacist will require a new prescription to be written. This means that you will be short on your medicine and we WILL NOT send another prescription to complete your original prescription.  Instead, request the pharmacy to send a carrier to a nearby branch to get enough medication to provide you with your full prescription. Prescription Accuracy: You are responsible for carefully inspecting your prescriptions  before leaving our office. Have the discharge nurse carefully go over each prescription with you, before taking them home. Make sure that your name is accurately spelled, that your address is correct. Check the name and dose of your medication to make sure it is accurate. Check the number of pills, and the written instructions to make sure they are clear and accurate. Make sure that you are given enough medication to last until your next medication refill appointment. Taking Medication: Take medication as prescribed. When it comes to controlled substances, taking less pills or less frequently than prescribed is permitted and encouraged. Never take more pills than instructed. Never take the medication more frequently than prescribed.  Inform other Doctors: Always inform, all of your healthcare providers, of all the medications you take. Pain  Medication from other Providers: You are not allowed to accept any additional pain medication from any other Doctor or Healthcare provider. There are two exceptions to this rule. (see below) In the event that you require additional pain medication, you are responsible for notifying us, as stated below. Cough Medicine: Often these contain an opioid, such as codeine or hydrocodone. Never accept or take cough medicine containing these opioids if you are already taking an opioid* medication. The combination may cause respiratory failure and death. Medication Agreement: You are responsible for carefully reading and following our Medication Agreement. This must be signed before receiving any prescriptions from our practice. Safely store a copy of your signed Agreement. Violations to the Agreement will result in no further prescriptions. (Additional copies of our Medication Agreement are available upon request.) Laws, Rules, & Regulations: All patients are expected to follow all Federal and [employer], Rules, [employer]. Ignorance of the Laws does not constitute a valid excuse.  Illegal drugs and Controlled Substances: The use of illegal substances (including, but not limited to marijuana and its derivatives) and/or the illegal use of any controlled substances is strictly prohibited. Violation of this rule may result in the immediate and permanent discontinuation of any and all prescriptions being written by our practice. The use of any illegal substances is prohibited. Adopted CDC guidelines & recommendations: Target dosing levels will be at or below 60 MME/day. Use of benzodiazepines** is not recommended.  Exceptions: There are only two exceptions to the rule of not receiving pain medications from other Healthcare Providers. Exception #1 (Emergencies): In the event of an emergency (i.e.: accident requiring emergency care), you are allowed to receive additional pain medication. However, you are  responsible for: As soon as you are able, call our office [phone], at any time of the day or night, and leave a message stating your name, the date and nature of the emergency, and the name and dose of the medication prescribed. In the event that your call is answered by a member of our staff, make sure to document and save the date, time, and the name of the person that took your information.  Exception #2 (Planned Surgery): In the event that you are scheduled by another doctor or dentist to have any type of surgery or procedure, you are allowed (for a period no longer than 30 days), to receive additional pain medication, for the acute post-op pain. However, in this case, you are responsible for picking up a copy of our "Post-op Pain Management for Surgeons" handout, and giving it to your surgeon or dentist. This document is available at our office, and does not require an appointment to obtain it. Simply go to our  office during business hours (Monday-Thursday from 8:00 AM to 4:00 PM) (Friday 8:00 AM to 12:00 Noon) or if you have a scheduled appointment with [place], prior to your surgery, and ask for it by name. In addition, you are responsible for: calling our office [phone], at any time of the day or night, and leaving a message stating your name, name of your surgeon, type of surgery, and date of procedure or surgery. Failure to comply with your responsibilities may result in termination of therapy involving the controlled substances. Medication Agreement Violation. Following the above rules, including your responsibilities will help you in avoiding a Medication Agreement Violation ("Breaking your Pain Medication Contract").  Consequences:  Not following the above rules may result in permanent discontinuation of medication prescription therapy.  *Opioid medications include: morphine, codeine, oxycodone, oxymorphone, hydrocodone, hydromorphone, meperidine, tramadol, tapentadol, buprenorphine,  fentanyl, methadone. **Benzodiazepine medications include: diazepam (Valium), alprazolam (Xanax), clonazepam (Klonopine), lorazepam (Ativan), clorazepate (Tranxene), chlordiazepoxide (Librium), estazolam (Prosom), oxazepam (Serax), temazepam (Restoril), triazolam (Halcion) (Last updated: 11/09/2021) ______________________________________________________________________    ______________________________________________________________________  Medication Recommendations and Reminders  Applies to: All patients receiving prescriptions (written and/or electronic).  Medication Rules & Regulations: You are responsible for reading, knowing, and following our "Medication Rules" document. These exist for your safety and that of others. They are not flexible and neither are we. Dismissing or ignoring them is an act of "non-compliance" that may result in complete and irreversible termination of such medication therapy. For safety reasons, "non-compliance" will not be tolerated. As with the U[place] fundamental legal principle of "ignorance of the law is no defense", we will accept no excuses for not having read and knowing the content of documents provided to you by our practice.  Pharmacy of record:  Definition: This is the pharmacy where your electronic prescriptions will be sent.  We do not endorse any particular pharmacy. It is up to you and your insurance to decide what pharmacy to use.  We do not restrict you in your choice of pharmacy. However, once we write for your prescriptions, we will NOT be re-sending more prescriptions to fix restricted supply problems created by your pharmacy, or your insurance.  The pharmacy listed in the electronic medical record should be the one where you want electronic prescriptions to be sent. If you choose to change pharmacy, simply notify our nursing staff. Changes will be made only during your regular appointments and not over the phone.  Recommendations: Keep all of  your pain medications in a safe place, under lock and key, even if you live alone. We will NOT replace lost, stolen, or damaged medication. We do not accept "Police Reports" as proof of medications having been stolen. After you fill your prescription, take 1 week's worth of pills and put them away in a safe place. You should keep a separate, properly labeled bottle for this purpose. The remainder should be kept in the original bottle. Use this as your primary supply, until it runs out. Once it's gone, then you know that you have 1 week's worth of medicine, and it is time to come in for a prescription refill. If you do this correctly, it is unlikely that you will ever run out of medicine. To make sure that the above recommendation works, it is very important that you make sure your medication refill appointments are scheduled at least 1 week before you run out of medicine. To do this in an effective manner, make sure that you do not leave the office without scheduling  your next medication management appointment. Always ask the nursing staff to show you in your prescription , when your medication will be running out. Then arrange for the receptionist to get you a return appointment, at least 7 days before you run out of medicine. Do not wait until you have 1 or 2 pills left, to come in. This is very poor planning and does not take into consideration that we may need to cancel appointments due to bad weather, sickness, or emergencies affecting our staff. DO NOT ACCEPT A "Partial Fill": If for any reason your pharmacy does not have enough pills/tablets to completely fill or refill your prescription, do not allow for a "partial fill". The law allows the pharmacy to complete that prescription within 72 hours, without requiring a new prescription. If they do not fill the rest of your prescription within those 72 hours, you will need a separate prescription to fill the remaining amount, which we will NOT provide. If the  reason for the partial fill is your insurance, you will need to talk to the pharmacist about payment alternatives for the remaining tablets, but again, DO NOT ACCEPT A PARTIAL FILL, unless you can trust your pharmacist to obtain the remainder of the pills within 72 hours.  Prescription refills and/or changes in medication(s):  Prescription refills, and/or changes in dose or medication, will be conducted only during scheduled medication management appointments. (Applies to both, written and electronic prescriptions.) No refills on procedure days. No medication will be changed or started on procedure days. No changes, adjustments, and/or refills will be conducted on a procedure day. Doing so will interfere with the diagnostic portion of the procedure. No phone refills. No medications will be "called into the pharmacy". No Fax refills. No weekend refills. No Holliday refills. No after hours refills.  Remember:  Business hours are:  Monday to Thursday 8:00 AM to 4:00 PM Provider's Schedule: Milinda Pointer, MD - Appointments are:  Medication management: Monday and Wednesday 8:00 AM to 4:00 PM Procedure day: Tuesday and Thursday 7:30 AM to 4:00 PM Gillis Santa, MD - Appointments are:  Medication management: Tuesday and Thursday 8:00 AM to 4:00 PM Procedure day: Monday and Wednesday 7:30 AM to 4:00 PM (Last update: 11/09/2021) ______________________________________________________________________    ____________________________________________________________________________________________  Drug Holidays  What is a "Drug Holiday"? Drug Holiday: is the name given to the process of slowly tapering down and temporarily stopping the pain medication for the purpose of decreasing or eliminating tolerance to the drug.  Benefits Improved effectiveness Decreased required effective dose Improved pain control End dependence on high dose therapy Decrease cost of therapy Uncovering  "opioid-induced hyperalgesia". (OIH)  What is "opioid hyperalgesia"? It is a paradoxical increase in pain caused by exposure to opioids. Stopping the opioid pain medication, contrary to the expected, it actually decreases or completely eliminates the pain. Ref.: "A comprehensive review of opioid-induced hyperalgesia". Brion Aliment, et.al. Pain Physician. 2011 Mar-Apr;14(2):145-61.  What is tolerance? Tolerance: the progressive loss of effectiveness of a pain medicine due to repetitive use. A common problem of opioid pain medications.  How long should a "Drug Holiday" last? Effectiveness depends on the patient staying off all opioid pain medicines for a minimum of 14 consecutive days. (2 weeks)  How about just taking less of the medicine? Does not work. Will not accomplish goal of eliminating the excess receptors.  How about switching to a different pain medicine? (AKA. "Opioid rotation") Does not work. Creates the illusion of effectiveness by taking advantage of inaccurate  equivalent dose calculations between different opioids. -This "technique" was promoted by studies funded by [employer], such as [employer], creators of "OxyContin".  Can I stop the medicine "cold [place]"? Depends. You should always coordinate with your Pain Specialist to make the transition as smoothly as possible. Avoid stopping the medicine abruptly without consulting. We recommend a "slow taper".  What is a slow taper? Taper: refers to the gradual decrease in dose.   How do I stop/taper the dose? Slowly. Decrease the daily amount of pills that you take by one (1) pill every seven (7) days. This is called a "slow downward taper". Example: if you normally take four (4) pills per day, drop it to three (3) pills per day for seven (7) days, then to two (2) pills per day for seven (7) days, then to one (1) per day for seven (7) days, and then stop the medicine. The 14 day "Drug Holiday" starts on the first day  without medicine.   Will I experience withdrawals? Unlikely with a slow taper.  What triggers withdrawals? Withdrawals are triggered by the sudden/abrupt stop of high dose opioids. Withdrawals can be avoided by slowly decreasing the dose over a prolonged period of time.  What are withdrawals? Symptoms associated with sudden/abrupt reduction/stopping of high-dose, long-term use of pain medication. Withdrawal are seldom seen on low dose therapy, or patients rarely taking opioid medication.  Early Withdrawal Symptoms may include: Agitation Anxiety Muscle aches Increased tearing Insomnia Runny nose Sweating Yawning  Late symptoms may include: Abdominal cramping Diarrhea Dilated pupils Goose bumps Nausea Vomiting  (Last update: 12/26/2021) ____________________________________________________________________________________________    ____________________________________________________________________________________________  WARNING: CBD (cannabidiol) & Delta (Delta-8 tetrahydrocannabinol) products.   Applicable to:  All individuals currently taking or considering taking CBD (cannabidiol) and, more important, all patients taking opioid analgesic controlled substances (pain medication). (Example: oxycodone; oxymorphone; hydrocodone; hydromorphone; morphine; methadone; tramadol; tapentadol; fentanyl; buprenorphine; butorphanol; dextromethorphan; meperidine; codeine; etc.)  Introduction:  Recently there has been a drive towards the use of "natural" products for the treatment of different conditions, including pain anxiety and sleep disorders. Marijuana and hemp are two varieties of the cannabis genus plants. Marijuana and its derivatives are illegal, while hemp and its derivatives are not. Cannabidiol (CBD) and tetrahydrocannabinol (THC), are two natural compounds found in plants of the Cannabis genus. They can both be extracted from hemp or marijuana. Both compounds interact with  your body's endocannabinoid system in very different ways. CBD is associated with pain relief (analgesia) while THC is associated with the psychoactive effects ("the high") obtained from the use of marijuana products. There are two main types of THC: Delta-9, which comes from the marijuana plant and it is illegal, and Delta-8, which comes from the hemp plant, and it is legal. (Both, Delta-9-THC and Delta-8-THC are psychoactive and give you "the high".)   Legality:  Marijuana and its derivatives: illegal Hemp and its derivatives: Legal (State dependent) UPDATE: (03/05/2021) The Drug Enforcement Agency ([place]) issued a letter stating that "delta" cannabinoids, including Delta-8-THCO and Delta-9-THCO, synthetically derived from hemp do not qualify as hemp and will be viewed as Schedule I drugs. (Schedule I drugs, substances, or chemicals are defined as drugs with no currently accepted medical use and a high potential for abuse. Some examples of Schedule I drugs are: heroin, lysergic acid diethylamide (LSD), marijuana (cannabis), 3,4-methylenedioxymethamphetamine (ecstasy), methaqualone, and peyote.) (https://jennings.com/)  Legal status of CBD in [place]:  "Conditionally Legal"  Reference: "FDA Regulation of Cannabis and Cannabis-Derived Products, Including Cannabidiol (CBD)" -  SeekArtists.com.pt  Warning:  CBD is not FDA approved and has not undergo the same manufacturing controls as prescription drugs.  This means that the purity and safety of available CBD may be questionable. Most of the time, despite manufacturer's claims, it is contaminated with THC (delta-9-tetrahydrocannabinol - the chemical in marijuana responsible for the "HIGH").  When this is the case, the [facility] contaminant will trigger a positive urine drug screen (UDS) test for Marijuana (carboxy-THC).   The FDA recently put out a warning  about 5 things that everyone should be aware of regarding Delta-8 THC: Delta-8 THC products have not been evaluated or approved by the FDA for safe use and may be marketed in ways that put the public health at risk. The FDA has received adverse event reports involving delta-8 THC-containing products. Delta-8 THC has psychoactive and intoxicating effects. Delta-8 THC manufacturing often involve use of potentially harmful chemicals to create the concentrations of delta-8 THC claimed in the marketplace. The final delta-8 THC product may have potentially harmful by-products (contaminants) due to the chemicals used in the process. Manufacturing of delta-8 THC products may occur in uncontrolled or unsanitary settings, which may lead to the presence of unsafe contaminants or other potentially harmful substances. Delta-8 THC products should be kept out of the reach of children and pets.  NOTE: Because a positive UDS for any illicit substance is a violation of our medication agreement, your opioid analgesics (pain medicine) may be permanently discontinued.  MORE ABOUT CBD  General Information: CBD was discovered in [birth year] and it is a derivative of the cannabis sativa genus plants (Marijuana and Hemp). It is one of the 113 identified substances found in Marijuana. It accounts for up to 40% of the plant's extract. As of 2018, preliminary clinical studies on CBD included research for the treatment of anxiety, movement disorders, and pain. CBD is available and consumed in multiple forms, including inhalation of smoke or vapor, as an aerosol spray, and by mouth. It may be supplied as an oil containing CBD, capsules, dried cannabis, or as a liquid solution. CBD is thought not to be as psychoactive as THC (delta-9-tetrahydrocannabinol - the chemical in marijuana responsible for the "HIGH"). Studies suggest that CBD may interact with different biological target receptors in the body, including cannabinoid and other  neurotransmitter receptors. As of 2018 the mechanism of action for its biological effects has not been determined.  Side-effects  Adverse reactions: Dry mouth, diarrhea, decreased appetite, fatigue, drowsiness, malaise, weakness, sleep disturbances, and others.  Drug interactions:  CBD may interact with medications such as blood-thinners. CBD causes drowsiness on its own and it will increase drowsiness caused by other medications, including antihistamines (such as Benadryl), benzodiazepines (Xanax, Ativan, Valium), antipsychotics, antidepressants, opioids, alcohol and supplements such as kava, melatonin and St. John's Wort.  Other drug interactions: Brivaracetam (Briviact); Caffeine; Carbamazepine (Tegretol); Citalopram (Celexa); Clobazam (Onfi); Eslicarbazepine (Aptiom); Everolimus (Zostress); Lithium; Methadone (Dolophine); Rufinamide (Banzel); Sedative medications (CNS depressants); Sirolimus (Rapamune); Stiripentol (Diacomit); Tacrolimus (Prograf); Tamoxifen ; Soltamox); Topiramate (Topamax); Valproate; Warfarin (Coumadin); Zonisamide. (Last update: 12/27/2021) ____________________________________________________________________________________________   ____________________________________________________________________________________________  Naloxone Nasal Spray  Why am I receiving this medication? [place] STOP ACT requires that all patients taking high dose opioids or at risk of opioids respiratory depression, be prescribed an opioid reversal agent, such as Naloxone (AKA: Narcan).  What is this medication? NALOXONE (nal OX one) treats opioid overdose, which causes slow or shallow breathing, severe drowsiness, or trouble staying awake. Call emergency services after using this medication. You  may need additional treatment. Naloxone works by reversing the effects of opioids. It belongs to a group of medications called opioid blockers.  COMMON BRAND NAME(S): Kloxxado, Narcan  What  should I tell my care team before I take this medication? They need to know if you have any of these conditions: Heart disease Substance use disorder An unusual or allergic reaction to naloxone, other medications, foods, dyes, or preservatives Pregnant or trying to get pregnant Breast-feeding  When to use this medication? This medication is to be used for the treatment of respiratory depression (less than 8 breaths per minute) secondary to opioid overdose.   How to use this medication? This medication is for use in the nose. Lay the person on their back. Support their neck with your hand and allow the head to tilt back before giving the medication. The nasal spray should be given into 1 nostril. After giving the medication, move the person onto their side. Do not remove or test the nasal spray until ready to use. Get emergency medical help right away after giving the first dose of this medication, even if the person wakes up. You should be familiar with how to recognize the signs and symptoms of a narcotic overdose. If more doses are needed, give the additional dose in the other nostril. Talk to your care team about the use of this medication in children. While this medication may be prescribed for children as young as newborns for selected conditions, precautions do apply.  Naloxone Overdosage: If you think you have taken too much of this medicine contact a poison control center or emergency room at once.  NOTE: This medicine is only for you. Do not share this medicine with others.  What if I miss a dose? This does not apply.  What may interact with this medication? This is only used during an emergency. No interactions are expected during emergency use. This list may not describe all possible interactions. Give your health care provider a list of all the medicines, herbs, non-prescription drugs, or dietary supplements you use. Also tell them if you smoke, drink alcohol, or use illegal  drugs. Some items may interact with your medicine.  What should I watch for while using this medication? Keep this medication ready for use in the case of an opioid overdose. Make sure that you have the phone number of your care team and local hospital ready. You may need to have additional doses of this medication. Each nasal spray contains a single dose. Some emergencies may require additional doses. After use, bring the treated person to the nearest hospital or call 911. Make sure the treating care team knows that the person has received a dose of this medication. You will receive additional instructions on what to do during and after use of this medication before an emergency occurs.  What side effects may I notice from receiving this medication? Side effects that you should report to your care team as soon as possible: Allergic reactions--skin rash, itching, hives, swelling of the face, lips, tongue, or throat Side effects that usually do not require medical attention (report these to your care team if they continue or are bothersome): Constipation Dryness or irritation inside the nose Headache Increase in blood pressure Muscle spasms Stuffy nose Toothache This list may not describe all possible side effects. Call your doctor for medical advice about side effects. You may report side effects to FDA at 1-800-FDA-1088.  Where should I keep my medication? Because this is an emergency  medication, you should keep it with you at all times.  Keep out of the reach of children and pets. Store between 20 and 25 degrees C (68 and 77 degrees F). Do not freeze. Throw away any unused medication after the expiration date. Keep in original box until ready to use.  NOTE: This sheet is a summary. It may not cover all possible information. If you have questions about this medicine, talk to your doctor, pharmacist, or health care provider.   2023 Elsevier/Gold Standard (2020-09-11  00:00:00)  ____________________________________________________________________________________________   ____________________________________________________________________________________________  Patient Information update  To: All of our patients.  Re: Name change.  It has been made official that our current name, "[facility]"   will soon be changed to "[place]".   The purpose of this change is to eliminate any confusion created by the concept of our practice being a "Medication Management Pain Clinic". In the past this has led to the misconception that we treat pain primarily by the use of prescription medications.  Nothing can be farther from the truth.   Understanding PAIN MANAGEMENT: To further understand what our practice does, you first have to understand that "Pain Management" is a subspecialty that requires additional training once a physician has completed their specialty training, which can be in either Anesthesia, Neurology, Psychiatry, or Physical Medicine and Rehabilitation (PMR). Each one of these contributes to the final approach taken by each physician to the management of their patient's pain. To be a "Pain Management Specialist" you must have first completed one of the specialty trainings below.  Anesthesiologists - trained in clinical pharmacology and interventional techniques such as nerve blockade and regional as well as central neuroanatomy. They are trained to block pain before, during, and after surgical interventions.  Neurologists - trained in the diagnosis and pharmacological treatment of complex neurological conditions, such as Multiple Sclerosis, Parkinson's, spinal cord injuries, and other systemic conditions that may be associated with symptoms that may include but are not limited to pain. They tend to rely primarily on the treatment of chronic  pain using prescription medications.  Psychiatrist - trained in conditions affecting the psychosocial wellbeing of patients including but not limited to depression, anxiety, schizophrenia, personality disorders, addiction, and other substance use disorders that may be associated with chronic pain. They tend to rely primarily on the treatment of chronic pain using prescription medications.   Physical Medicine and Rehabilitation (PMR) physicians, also known as physiatrists - trained to treat a wide variety of medical conditions affecting the brain, spinal cord, nerves, bones, joints, ligaments, muscles, and tendons. Their training is primarily aimed at treating patients that have suffered injuries that have caused severe physical impairment. Their training is primarily aimed at the physical therapy and rehabilitation of those patients. They may also work alongside orthopedic surgeons or neurosurgeons using their expertise in assisting surgical patients to recover after their surgeries.  INTERVENTIONAL PAIN MANAGEMENT is sub-subspecialty of Pain Management.  Our physicians are Board-certified in Anesthesia, Pain Management, and Interventional Pain Management.  This meaning that not only have they been trained and Board-certified in their specialty of Anesthesia, and subspecialty of Pain Management, but they have also received further training in the sub-subspecialty of Interventional Pain Management, in order to become Board-certified as INTERVENTIONAL PAIN MANAGEMENT SPECIALIST.    Mission: Our goal is to use our skills in  [place] as alternatives to the chronic use of prescription opioid medications  for the treatment of pain. To make this more clear, we have changed our name to reflect what we do and offer. We will continue to offer medication management assessment and recommendations, but we will not be taking over any patient's medication  management.  ____________________________________________________________________________________________

## 2022-02-18 DIAGNOSIS — M170 Bilateral primary osteoarthritis of knee: Secondary | ICD-10-CM | POA: Diagnosis not present

## 2022-02-18 DIAGNOSIS — E1142 Type 2 diabetes mellitus with diabetic polyneuropathy: Secondary | ICD-10-CM | POA: Diagnosis not present

## 2022-02-18 DIAGNOSIS — M47816 Spondylosis without myelopathy or radiculopathy, lumbar region: Secondary | ICD-10-CM | POA: Diagnosis not present

## 2022-02-18 LAB — TOXASSURE SELECT 13 (MW), URINE

## 2022-02-21 ENCOUNTER — Telehealth: Payer: Self-pay | Admitting: Podiatry

## 2022-02-21 NOTE — Telephone Encounter (Signed)
Left message on voicemail to call back to set up time to pick up diabetic shoes they are currently in [place], needs sent to [facility] on 05/09/22

## 2022-02-22 DIAGNOSIS — J4530 Mild persistent asthma, uncomplicated: Secondary | ICD-10-CM | POA: Diagnosis not present

## 2022-02-22 MED ORDER — ALBUTEROL SULFATE (2.5 MG/3ML) 0.083% IN NEBU: INHALATION_SOLUTION | RESPIRATORY_TRACT | 12 refills | Status: DC

## 2022-02-22 NOTE — Addendum Note (Signed)
Addended by: Olin Hauser on: 02/22/2022 09:25 AM   Modules accepted: Orders

## 2022-02-23 DIAGNOSIS — J4530 Mild persistent asthma, uncomplicated: Secondary | ICD-10-CM | POA: Diagnosis not present

## 2022-02-25 NOTE — Progress Notes (Unsigned)
PROVIDER NOTE: Interpretation of information contained herein should be left to medically-trained personnel. Specific patient instructions are provided elsewhere under "Patient Instructions" section of medical record. This document was created in part using STT-dictation technology, any transcriptional errors that may result from this process are unintentional.  Patient: James [name] Type: Established DOB: 02/05/1960 MRN: [id number] PCP: Olin Hauser, DO  Service: Procedure DOS: 03/01/2022 Setting: Ambulatory Location: Ambulatory outpatient facility Delivery: Face-to-face Provider: Gaspar Cola, MD Specialty: Interventional Pain Management Specialty designation: 09 Location: Outpatient facility Ref. Prov.: Milinda Pointer, MD       Interventional Therapy   Procedure: Lumbar epidural steroid injection (LESI) (interlaminar) #2    Laterality: Right   Level:  L2-3 Level.  Imaging: Fluoroscopic guidance         Anesthesia: Local anesthesia (1-2% Lidocaine) Anxiolysis: None                 Sedation:                         DOS: 03/01/2022  Performed by: Gaspar Cola, MD  Purpose: Diagnostic/Therapeutic Indications: Lumbar radicular pain of intraspinal etiology of more than 4 weeks that has failed to respond to conservative therapy and is severe enough to impact quality of life or function. No diagnosis found. NAS-11 Pain score:   Pre-procedure:  /10   Post-procedure:  /10      Position / Prep / Materials:  Position: Prone w/ head of the table raised (slight reverse trendelenburg) to facilitate breathing.  Prep solution: DuraPrep (Iodine Povacrylex [0.7% available iodine] and Isopropyl Alcohol, 74% w/w) Prep Area: Entire Posterior Lumbar Region from lower scapular tip down to mid buttocks area and from flank to flank. Materials:  Tray: Epidural tray Needle(s):  Type: Epidural needle (Tuohy) Gauge (G):  17 Length: Regular (3.5-in) Qty: 1   Pre-op  H&P Assessment:  James [name] is a 62 y.o. (year old), male patient, seen today for interventional treatment. He  has a past surgical history that includes Colonoscopy with propofol; Appendectomy; and Colonoscopy with propofol (N/A, 04/23/2021). James [name] has a current medication list which includes the following prescription(s): albuterol, albuterol, atorvastatin, dapagliflozin propanediol, fluticasone, furosemide, hydrochlorothiazide, tresiba flextouch, victoza, losartan, metformin, naloxone, [START ON 03/02/2022] oxycodone hcl, [START ON 04/01/2022] oxycodone hcl, [START ON 05/01/2022] oxycodone hcl, ozempic (1 mg/dose), pantoprazole, pregabalin, and varenicline. His primarily concern today is the No chief complaint on file.  Initial Vital Signs:  Pulse/HCG Rate:    Temp:   Resp:   BP:   SpO2:    BMI: Estimated body mass index is 40.44 kg/m as calculated from the following:   Height as of 02/16/22: 6' 2"$  (1.88 m).   Weight as of 02/16/22: 315 lb (142.9 kg).  Risk Assessment: Allergies: Reviewed. He has No Known Allergies.  Allergy Precautions: None required Coagulopathies: Reviewed. None identified.  Blood-thinner therapy: None at this time Active Infection(s): Reviewed. None identified. James [name] is afebrile  Site Confirmation: James [name] was asked to confirm the procedure and laterality before marking the site Procedure checklist: Completed Consent: Before the procedure and under the influence of no sedative(s), amnesic(s), or anxiolytics, the patient was informed of the treatment options, risks and possible complications. To fulfill our ethical and legal obligations, as recommended by the American Medical Association's Code of Ethics, I have informed the patient of my clinical impression; the nature and purpose of the treatment or procedure; the risks, benefits, and possible complications of  the intervention; the alternatives, including doing nothing; the risk(s) and benefit(s)  of the alternative treatment(s) or procedure(s); and the risk(s) and benefit(s) of doing nothing. The patient was provided information about the general risks and possible complications associated with the procedure. These may include, but are not limited to: failure to achieve desired goals, infection, bleeding, organ or nerve damage, allergic reactions, paralysis, and death. In addition, the patient was informed of those risks and complications associated to Spine-related procedures, such as failure to decrease pain; infection (i.e.: Meningitis, epidural or intraspinal abscess); bleeding (i.e.: epidural hematoma, subarachnoid hemorrhage, or any other type of intraspinal or peri-dural bleeding); organ or nerve damage (i.e.: Any type of peripheral nerve, nerve root, or spinal cord injury) with subsequent damage to sensory, motor, and/or autonomic systems, resulting in permanent pain, numbness, and/or weakness of one or several areas of the body; allergic reactions; (i.e.: anaphylactic reaction); and/or death. Furthermore, the patient was informed of those risks and complications associated with the medications. These include, but are not limited to: allergic reactions (i.e.: anaphylactic or anaphylactoid reaction(s)); adrenal axis suppression; blood sugar elevation that in diabetics may result in ketoacidosis or comma; water retention that in patients with history of congestive heart failure may result in shortness of breath, pulmonary edema, and decompensation with resultant heart failure; weight gain; swelling or edema; medication-induced neural toxicity; particulate matter embolism and blood vessel occlusion with resultant organ, and/or nervous system infarction; and/or aseptic necrosis of one or more joints. Finally, the patient was informed that Medicine is not an exact science; therefore, there is also the possibility of unforeseen or unpredictable risks and/or possible complications that may result in a  catastrophic outcome. The patient indicated having understood very clearly. We have given the patient no guarantees and we have made no promises. Enough time was given to the patient to ask questions, all of which were answered to the patient's satisfaction. Mr. Hauptmann has indicated that he wanted to continue with the procedure. Attestation: I, the ordering provider, attest that I have discussed with the patient the benefits, risks, side-effects, alternatives, likelihood of achieving goals, and potential problems during recovery for the procedure that I have provided informed consent. Date  Time: {CHL [facility]-PAIN TIME CHOICES:[id number]}   Pre-Procedure Preparation:  Monitoring: As per clinic protocol. Respiration, ETCO2, SpO2, BP, heart rate and rhythm monitor placed and checked for adequate function Safety Precautions: Patient was assessed for positional comfort and pressure points before starting the procedure. Time-out: I initiated and conducted the "Time-out" before starting the procedure, as per protocol. The patient was asked to participate by confirming the accuracy of the "Time Out" information. Verification of the correct person, site, and procedure were performed and confirmed by me, the nursing staff, and the patient. "Time-out" conducted as per Joint Commission's Universal Protocol (UP.01.01.01). Time:    Description/Narrative of Procedure:          Target: Epidural space via interlaminar opening, initially targeting the lower laminar border of the superior vertebral body. Region: Lumbar Approach: Percutaneous paravertebral  Rationale (medical necessity): procedure needed and proper for the diagnosis and/or treatment of the patient's medical symptoms and needs. Procedural Technique Safety Precautions: Aspiration looking for blood return was conducted prior to all injections. At no point did we inject any substances, as a needle was being advanced. No attempts were made at seeking any  paresthesias. Safe injection practices and needle disposal techniques used. Medications properly checked for expiration dates. SDV (single dose vial) medications used. Description of the Procedure:  Protocol guidelines were followed. The procedure needle was introduced through the skin, ipsilateral to the reported pain, and advanced to the target area. Bone was contacted and the needle walked caudad, until the lamina was cleared. The epidural space was identified using "loss-of-resistance technique" with 2-3 ml of PF-NaCl (0.9% NSS), in a 5cc LOR glass syringe.  There were no vitals filed for this visit.  Start Time:   hrs. End Time:   hrs.  Imaging Guidance (Spinal):          Type of Imaging Technique: Fluoroscopy Guidance (Spinal) Indication(s): Assistance in needle guidance and placement for procedures requiring needle placement in or near specific anatomical locations not easily accessible without such assistance. Exposure Time: Please see nurses notes. Contrast: Before injecting any contrast, we confirmed that the patient did not have an allergy to iodine, shellfish, or radiological contrast. Once satisfactory needle placement was completed at the desired level, radiological contrast was injected. Contrast injected under live fluoroscopy. No contrast complications. See chart for type and volume of contrast used. Fluoroscopic Guidance: I was personally present during the use of fluoroscopy. "Tunnel Vision Technique" used to obtain the best possible view of the target area. Parallax error corrected before commencing the procedure. "Direction-depth-direction" technique used to introduce the needle under continuous pulsed fluoroscopy. Once target was reached, antero-posterior, oblique, and lateral fluoroscopic projection used confirm needle placement in all planes. Images permanently stored in EMR. Interpretation: I personally interpreted the imaging intraoperatively. Adequate needle placement  confirmed in multiple planes. Appropriate spread of contrast into desired area was observed. No evidence of afferent or efferent intravascular uptake. No intrathecal or subarachnoid spread observed. Permanent images saved into the patient's record.  Antibiotic Prophylaxis:   Anti-infectives (From admission, onward)    None      Indication(s): None identified  Post-operative Assessment:  Post-procedure Vital Signs:  Pulse/HCG Rate:    Temp:   Resp:   BP:   SpO2:    EBL: None  Complications: No immediate post-treatment complications observed by team, or reported by patient.  Note: The patient tolerated the entire procedure well. A repeat set of vitals were taken after the procedure and the patient was kept under observation following institutional policy, for this type of procedure. Post-procedural neurological assessment was performed, showing return to baseline, prior to discharge. The patient was provided with post-procedure discharge instructions, including a section on how to identify potential problems. Should any problems arise concerning this procedure, the patient was given instructions to immediately contact us, at any time, without hesitation. In any case, we plan to contact the patient by telephone for a follow-up status report regarding this interventional procedure.  Comments:  No additional relevant information.  Plan of Care (POC)  Orders:  No orders of the defined types were placed in this encounter.  Chronic Opioid Analgesic:  Oxycodone/APAP 10/325 (# 60/month), 1 tab p.o. twice daily (last filled on 02/03/2021) Needs to loose an average of 2.75 lbs/mo to be considered for opioid pharmacotherapy.  MME/day: 30 mg/day   Medications ordered for procedure: No orders of the defined types were placed in this encounter.  Medications administered: Mayur Paolo had no medications administered during this visit.  See the medical record for exact dosing, route, and  time of administration.  Follow-up plan:   No follow-ups on file.       Interventional Therapies  Risk  Complexity Considerations:   Estimated body mass index is 45.98 kg/m down from 50.97 kg/m as calculated from the following:  Height as of this encounter: 6' (1.829 m).   Weight as of this encounter: 339 lbs down from 375 lb 12.8 oz (170.5 kg). Our goal is to bring his BMI down to 30 kg/m (220 lbs for his height). He is supposed to lose 2.75 lbs/month. WNL   Planned  Pending:   Palliative right L2-3 LESI #4    Under consideration:   Diagnostic/therapeutic left IA AC + glenohumeral joint inj. #1  Diagnostic/therapeutic left suprascapular NB #1  Diagnostic/therapeutic bilateral IA hip inj. #1  Diagnostic/therapeutic bilateral IA steroid knee inj. #1  Therapeutic bilateral IA viscosupplementation knee inj. #1  Diagnostic/therapeutic bilateral lumbar facet MBB #1    Completed:   Diagnostic/therapeutic right L2-3 LESI x1 (12/23/2021) (100/100/80/LBP:40LEP:100)  Diagnostic/therapeutic right L3-4 LESI x1 (11/04/21) (100/100/25/25) (100% of LBP)  Diagnostic/therapeutic right L4-5 LESI x2 (11/30/2021) (100/100/100/80)  Toradol/Norflex IM 60/60 mg (03/29/2021)  Referral to orthopedic surgery for evaluation of knees and hip (03/29/2021)  Referral to medical weight management (02/15/2021)  Referral to bariatric surgery (02/15/2021)    Completed by other providers:   [facility]  EMG/PNCV (LE) by Gurney Maxin, MD [facility] neurology) (03/16/2021) (chronic, severe sensorimotor polyneuropathy)   Therapeutic  Palliative (PRN) options:   None established       Recent Visits Date Type Provider Dept  02/16/22 Office Visit Milinda Pointer, MD [facility]  01/06/22 Office Visit Milinda Pointer, MD [facility]  12/23/21 Procedure visit Milinda Pointer, MD [facility]  12/14/21 Office Visit Milinda Pointer, MD  [facility]  11/30/21 Procedure visit Milinda Pointer, MD [facility]  Showing recent visits within past 90 days and meeting all other requirements Future Appointments Date Type Provider Dept  03/01/22 Appointment Milinda Pointer, MD [facility]  05/25/22 Appointment Milinda Pointer, MD [facility]  Showing future appointments within next 90 days and meeting all other requirements  Disposition: Discharge home  Discharge (Date  Time): 03/01/2022;   hrs.   Primary Care Physician: Olin Hauser, DO Location: [facility] Note by: Gaspar Cola, MD Date: 03/01/2022; Time: 11:30 AM  Disclaimer:  Medicine is not an Chief Strategy Officer. The only guarantee in medicine is that nothing is guaranteed. It is important to note that the decision to proceed with this intervention was based on the information collected from the patient. The Data and conclusions were drawn from the patient's questionnaire, the interview, and the physical examination. Because the information was provided in large part by the patient, it cannot be guaranteed that it has not been purposely or unconsciously manipulated. Every effort has been made to obtain as much relevant data as possible for this evaluation. It is important to note that the conclusions that lead to this procedure are derived in large part from the available data. Always take into account that the treatment will also be dependent on availability of resources and existing treatment guidelines, considered by other Pain Management Practitioners as being common knowledge and practice, at the time of the intervention. For Medico-Legal purposes, it is also important to point out that variation in procedural techniques and pharmacological choices are the acceptable norm. The indications, contraindications, technique, and results of the above procedure should only be interpreted and judged  by a Board-Certified Interventional Pain Specialist with extensive familiarity and expertise in the same exact procedure and technique.

## 2022-03-01 ENCOUNTER — Ambulatory Visit: Payer: 59 | Attending: Pain Medicine | Admitting: Pain Medicine

## 2022-03-01 ENCOUNTER — Ambulatory Visit: Payer: 59 | Admitting: Podiatry

## 2022-03-01 ENCOUNTER — Encounter: Payer: Self-pay | Admitting: Pain Medicine

## 2022-03-01 ENCOUNTER — Ambulatory Visit: Payer: Medicare HMO | Admitting: Podiatry

## 2022-03-01 ENCOUNTER — Ambulatory Visit
Admission: RE | Admit: 2022-03-01 | Discharge: 2022-03-01 | Disposition: A | Discharge status: Home / Self Care | Payer: 59 | Source: Ambulatory Visit | Attending: Pain Medicine | Admitting: Pain Medicine

## 2022-03-01 VITALS — BP 121/103 | HR 73 | Temp 97.0°F | Resp 16 | Ht 72.0 in | Wt 315.0 lb

## 2022-03-01 DIAGNOSIS — R7982 Elevated C-reactive protein (CRP): Secondary | ICD-10-CM | POA: Diagnosis not present

## 2022-03-01 DIAGNOSIS — M5136 Other intervertebral disc degeneration, lumbar region: Secondary | ICD-10-CM | POA: Insufficient documentation

## 2022-03-01 DIAGNOSIS — M25562 Pain in left knee: Secondary | ICD-10-CM | POA: Insufficient documentation

## 2022-03-01 DIAGNOSIS — R700 Elevated erythrocyte sedimentation rate: Secondary | ICD-10-CM | POA: Diagnosis not present

## 2022-03-01 DIAGNOSIS — R937 Abnormal findings on diagnostic imaging of other parts of musculoskeletal system: Secondary | ICD-10-CM | POA: Diagnosis not present

## 2022-03-01 DIAGNOSIS — G8929 Other chronic pain: Secondary | ICD-10-CM | POA: Insufficient documentation

## 2022-03-01 DIAGNOSIS — M5416 Radiculopathy, lumbar region: Secondary | ICD-10-CM | POA: Insufficient documentation

## 2022-03-01 DIAGNOSIS — E662 Morbid (severe) obesity with alveolar hypoventilation: Secondary | ICD-10-CM | POA: Diagnosis present

## 2022-03-01 DIAGNOSIS — M5442 Lumbago with sciatica, left side: Secondary | ICD-10-CM | POA: Diagnosis not present

## 2022-03-01 DIAGNOSIS — R0609 Other forms of dyspnea: Secondary | ICD-10-CM | POA: Insufficient documentation

## 2022-03-01 DIAGNOSIS — I10 Essential (primary) hypertension: Secondary | ICD-10-CM | POA: Insufficient documentation

## 2022-03-01 DIAGNOSIS — M48061 Spinal stenosis, lumbar region without neurogenic claudication: Secondary | ICD-10-CM | POA: Insufficient documentation

## 2022-03-01 DIAGNOSIS — M5417 Radiculopathy, lumbosacral region: Secondary | ICD-10-CM | POA: Insufficient documentation

## 2022-03-01 DIAGNOSIS — M79605 Pain in left leg: Secondary | ICD-10-CM | POA: Insufficient documentation

## 2022-03-01 DIAGNOSIS — M5441 Lumbago with sciatica, right side: Secondary | ICD-10-CM | POA: Insufficient documentation

## 2022-03-01 DIAGNOSIS — M25551 Pain in right hip: Secondary | ICD-10-CM | POA: Insufficient documentation

## 2022-03-01 DIAGNOSIS — M79604 Pain in right leg: Secondary | ICD-10-CM | POA: Diagnosis not present

## 2022-03-01 DIAGNOSIS — D1779 Benign lipomatous neoplasm of other sites: Secondary | ICD-10-CM | POA: Insufficient documentation

## 2022-03-01 DIAGNOSIS — M25561 Pain in right knee: Secondary | ICD-10-CM | POA: Diagnosis not present

## 2022-03-01 DIAGNOSIS — M25552 Pain in left hip: Secondary | ICD-10-CM | POA: Diagnosis not present

## 2022-03-01 DIAGNOSIS — Z6841 Body Mass Index (BMI) 40.0 and over, adult: Secondary | ICD-10-CM | POA: Diagnosis present

## 2022-03-01 DIAGNOSIS — E1169 Type 2 diabetes mellitus with other specified complication: Secondary | ICD-10-CM | POA: Diagnosis not present

## 2022-03-01 DIAGNOSIS — J45909 Unspecified asthma, uncomplicated: Secondary | ICD-10-CM | POA: Insufficient documentation

## 2022-03-01 DIAGNOSIS — E7800 Pure hypercholesterolemia, unspecified: Secondary | ICD-10-CM | POA: Insufficient documentation

## 2022-03-01 MED ORDER — ROPIVACAINE HCL 2 MG/ML IJ SOLN
2.0000 mL | Freq: Once | INTRAMUSCULAR | Status: AC
  Administered 2022-03-01: 2 mL via EPIDURAL

## 2022-03-01 MED ORDER — SODIUM CHLORIDE 0.9% FLUSH
2.0000 mL | Freq: Once | INTRAVENOUS | Status: AC
  Administered 2022-03-01: 2 mL

## 2022-03-01 MED ORDER — IOHEXOL 180 MG/ML  SOLN
10.0000 mL | Freq: Once | INTRAMUSCULAR | Status: AC
  Administered 2022-03-01: 10 mL via EPIDURAL

## 2022-03-01 MED ORDER — MIDAZOLAM HCL 2 MG/2ML IJ SOLN
INTRAMUSCULAR | Status: AC
  Filled 2022-03-01: qty 2

## 2022-03-01 MED ORDER — SODIUM CHLORIDE (PF) 0.9 % IJ SOLN
INTRAMUSCULAR | Status: AC
  Filled 2022-03-01: qty 10

## 2022-03-01 MED ORDER — MIDAZOLAM HCL 2 MG/2ML IJ SOLN
0.5000 mg | Freq: Once | INTRAMUSCULAR | Status: AC
  Administered 2022-03-01: 1.5 mg via INTRAVENOUS

## 2022-03-01 MED ORDER — TRIAMCINOLONE ACETONIDE 40 MG/ML IJ SUSP
40.0000 mg | Freq: Once | INTRAMUSCULAR | Status: AC
  Administered 2022-03-01: 40 mg

## 2022-03-01 MED ORDER — LACTATED RINGERS IV SOLN: Freq: Once | INTRAVENOUS | Status: AC

## 2022-03-01 MED ORDER — LIDOCAINE HCL 2 % IJ SOLN
20.0000 mL | Freq: Once | INTRAMUSCULAR | Status: AC
  Administered 2022-03-01: 100 mg

## 2022-03-01 MED ORDER — IOHEXOL 180 MG/ML  SOLN
INTRAMUSCULAR | Status: AC
  Filled 2022-03-01: qty 20

## 2022-03-01 MED ORDER — ROPIVACAINE HCL 2 MG/ML IJ SOLN
INTRAMUSCULAR | Status: AC
  Filled 2022-03-01: qty 20

## 2022-03-01 MED ORDER — TRIAMCINOLONE ACETONIDE 40 MG/ML IJ SUSP
INTRAMUSCULAR | Status: AC
  Filled 2022-03-01: qty 1

## 2022-03-01 MED ORDER — PENTAFLUOROPROP-TETRAFLUOROETH EX AERO
INHALATION_SPRAY | Freq: Once | CUTANEOUS | Status: AC
  Administered 2022-03-01: 30 via TOPICAL

## 2022-03-01 NOTE — Patient Instructions (Signed)
____________________________________________________________________________________________  Post-Procedure Discharge Instructions  Instructions: Apply ice:  Purpose: This will minimize any swelling and discomfort after procedure.  When: Day of procedure, as soon as you get home. How: Fill a plastic sandwich bag with crushed ice. Cover it with a small towel and apply to injection site. How long: (15 min on, 15 min off) Apply for 15 minutes then remove x 15 minutes.  Repeat sequence on day of procedure, until you go to bed. Apply heat:  Purpose: To treat any soreness and discomfort from the procedure. When: Starting the next day after the procedure. How: Apply heat to procedure site starting the day following the procedure. How long: May continue to repeat daily, until discomfort goes away. Food intake: Start with clear liquids (like water) and advance to regular food, as tolerated.  Physical activities: Keep activities to a minimum for the first 8 hours after the procedure. After that, then as tolerated. Driving: If you have received any sedation, be responsible and do not drive. You are not allowed to drive for 24 hours after having sedation. Blood thinner: (Applies only to those taking blood thinners) You may restart your blood thinner 6 hours after your procedure. Insulin: (Applies only to Diabetic patients taking insulin) As soon as you can eat, you may resume your normal dosing schedule. Infection prevention: Keep procedure site clean and dry. Shower daily and clean area with soap and water. Post-procedure Pain Diary: Extremely important that this be done correctly and accurately. Recorded information will be used to determine the next step in treatment. For the purpose of accuracy, follow these rules: Evaluate only the area treated. Do not report or include pain from an untreated area. For the purpose of this evaluation, ignore all other areas of pain, except for the treated  area. After your procedure, avoid taking a long nap and attempting to complete the pain diary after you wake up. Instead, set your alarm clock to go off every hour, on the hour, for the initial 8 hours after the procedure. Document the duration of the numbing medicine, and the relief you are getting from it. Do not go to sleep and attempt to complete it later. It will not be accurate. If you received sedation, it is likely that you were given a medication that may cause amnesia. Because of this, completing the diary at a later time may cause the information to be inaccurate. This information is needed to plan your care. Follow-up appointment: Keep your post-procedure follow-up evaluation appointment after the procedure (usually 2 weeks for most procedures, 6 weeks for radiofrequencies). DO NOT FORGET to bring you pain diary with you.   Expect: (What should I expect to see with my procedure?) From numbing medicine (AKA: Local Anesthetics): Numbness or decrease in pain. You may also experience some weakness, which if present, could last for the duration of the local anesthetic. Onset: Full effect within 15 minutes of injected. Duration: It will depend on the type of local anesthetic used. On the average, 1 to 8 hours.  From steroids (Applies only if steroids were used): Decrease in swelling or inflammation. Once inflammation is improved, relief of the pain will follow. Onset of benefits: Depends on the amount of swelling present. The more swelling, the longer it will take for the benefits to be seen. In some cases, up to 10 days. Duration: Steroids will stay in the system x 2 weeks. Duration of benefits will depend on multiple posibilities including persistent irritating factors. Side-effects: If present, they   may typically last 2 weeks (the duration of the steroids). Frequent: Cramps (if they occur, drink Gatorade and take over-the-counter Magnesium 450-500 mg once to twice a day); water retention with  temporary weight gain; increases in blood sugar; decreased immune system response; increased appetite. Occasional: Facial flushing (red, warm cheeks); mood swings; menstrual changes. Uncommon: Long-term decrease or suppression of natural hormones; bone thinning. (These are more common with higher doses or more frequent use. This is why we prefer that our patients avoid having any injection therapies in other practices.)  Very Rare: Severe mood changes; psychosis; aseptic necrosis. From procedure: Some discomfort is to be expected once the numbing medicine wears off. This should be minimal if ice and heat are applied as instructed.  Call if: (When should I call?) You experience numbness and weakness that gets worse with time, as opposed to wearing off. New onset bowel or bladder incontinence. (Applies only to procedures done in the spine)  Emergency Numbers: Durning business hours (Monday - Thursday, 8:00 AM - 4:00 PM) (Friday, 9:00 AM - 12:00 Noon): [phone] After hours: [phone] NOTE: If you are having a problem and are unable connect with, or to talk to a provider, then go to your nearest urgent care or emergency department. If the problem is serious and urgent, please call 911. ____________________________________________________________________________________________   _________________________________________________________________________________________  Body mass index (BMI)  Body mass index (BMI) is a common tool for deciding whether a person has an appropriate body weight.  It measures a persons weight in relation to their height.   According to the [facility] of health (NIH): A BMI of less than 18.5 means that a person is underweight. A BMI of between 18.5 and 24.9 is ideal. A BMI of between 25 and 29.9 is overweight. A BMI over 30 indicates obesity.  Weight Management Required  URGENT: Your weight has been found to be adversely affecting your  health.  Dear James [name]:  Your current Estimated body mass index is 40.44 kg/m as calculated from the following:   Height as of 02/16/22: '6\' 2"'$  (1.88 m).   Weight as of 02/16/22: 315 lb (142.9 kg).  Please use the table below to identify your weight category and associated incidence of chronic pain, secondary to your weight.  Body Mass Index (BMI) Classification BMI level (kg/m2) Category Associated incidence of chronic pain  <18  Underweight   18.5-24.9 Ideal body weight   25-29.9 Overweight  20%  30-34.9 Obese (Class I)  68%  35-39.9 Severe obesity (Class II)  136%  >40 Extreme obesity (Class III)  254%   In addition: You will be considered "Morbidly Obese", if your BMI is above 30 and you have one or more of the following conditions which are known to be caused and/or directly associated with obesity: 1.    Type 2 Diabetes (Which in turn can lead to cardiovascular diseases (CVD), stroke, peripheral vascular diseases (PVD), retinopathy, nephropathy, and neuropathy) 2.    Cardiovascular Disease (High Blood Pressure; Congestive Heart Failure; High Cholesterol; Coronary Artery Disease; Angina; or History of Heart Attacks) 3.    Breathing problems (Asthma; obesity-hypoventilation syndrome; obstructive sleep apnea; chronic inflammatory airway disease; reactive airway disease; or shortness of breath) 4.    Chronic kidney disease 5.    Liver disease (nonalcoholic fatty liver disease) 6.    High blood pressure 7.    Acid reflux (gastroesophageal reflux disease; heartburn) 8.    Osteoarthritis (OA) (with any of the following: hip pain; knee  pain; and/or low back pain) 9.    Low back pain (Lumbar Facet Syndrome; and/or Degenerative Disc Disease) 10.  Hip pain (Osteoarthritis of hip) (For every 1 lbs of added body weight, there is a 2 lbs increase in pressure inside of each hip articulation. 1:2 mechanical relationship) 11.  Knee pain (Osteoarthritis of knee) (For every 1 lbs of added body  weight, there is a 4 lbs increase in pressure inside of each knee articulation. 1:4 mechanical relationship) (patients with a BMI>30 kg/m2 were 6.8 times more likely to develop knee OA than normal-weight individuals) 12.  Cancer: Epidemiological studies have shown that obesity is a risk factor for: post-menopausal breast cancer; cancers of the endometrium, colon and kidney cancer; malignant adenomas of the oesophagus. Obese subjects have an approximately 1.5-3.5-fold increased risk of developing these cancers compared with normal-weight subjects, and it has been estimated that between 15 and 45% of these cancers can be attributed to overweight. More recent studies suggest that obesity may also increase the risk of other types of cancer, including pancreatic, hepatic and gallbladder cancer. (Ref: Obesity and cancer. Pischon T, Nthlings U, [employer] H. Proc Nutr Soc. 2008 May;67(2):128-45. doi: 40.9811/B1478295621308657.) The International Agency for Research on Cancer (IARC) has identified 13 cancers associated with overweight and obesity: meningioma, multiple myeloma, adenocarcinoma of the esophagus, and cancers of the thyroid, postmenopausal breast cancer, gallbladder, stomach, liver, pancreas, kidney, ovaries, uterus, colon and rectal (colorectal) cancers. 58 percent of all cancers diagnosed in women and 24 percent of those diagnosed in men are associated with overweight and obesity.  Recommendation: At this point it is urgent that you take a step back and concentrate in loosing weight. Dedicate 100% of your efforts on this task. Nothing else will improve your health more than bringing your weight down and your BMI to less than 30. If you are here, you probably have chronic pain. We know that most chronic pain patients have difficulty exercising secondary to their pain. For this reason, you must rely on proper nutrition and diet in order to lose the weight. If your BMI is above 40, you should seriously consider  bariatric surgery. A realistic goal is to lose 10% of your body weight over a period of 12 months.  Be honest to yourself, if over time you have unsuccessfully tried to lose weight, then it is time for you to seek professional help and to enter a medically supervised weight management program, and/or undergo bariatric surgery. Stop procrastinating.   Pain management considerations:  1.    Pharmacological Problems: Be advised that the use of opioid analgesics (oxycodone; hydrocodone; morphine; methadone; codeine; and all of their derivatives) have been associated with decreased metabolism and weight gain.  For this reason, should we see that you are unable to lose weight while taking these medications, it may become necessary for [place] to taper down and indefinitely discontinue them.  2.    Technical Problems: The incidence of successful interventional therapies decreases as the patient's BMI increases. It is much more difficult to accomplish a safe and effective interventional therapy on a patient with a BMI above 35. 3.    Radiation Exposure Problems: The x-rays machine, used to accomplish injection therapies, will automatically increase their x-ray output in order to capture an appropriate bone image. This means that radiation exposure increases exponentially with the patient's BMI. (The higher the BMI, the higher the radiation exposure.) Although the level of radiation used at a given time is still safe to the patient, it  is not for the physician and/or assisting staff. Unfortunately, radiation exposure is accumulative. Because physicians and the staff have to do procedures and be exposed on a daily basis, this can result in health problems such as cancer and radiation burns. Radiation exposure to the staff is monitored by the radiation batches that they wear. The exposure levels are reported back to the staff on a quarterly basis. Depending on levels of exposure, physicians and staff may be obligated by law to  decrease this exposure. This means that they have the right and obligation to refuse providing therapies where they may be overexposed to radiation. For this reason, physicians may decline to offer therapies such as radiofrequency ablation or implants to patients with a BMI above 40. 4.    Current Trends: Be advised that the current trend is to no longer offer certain therapies to patients with a BMI equal to, or above 35, due to increase perioperative risks, increased technical procedural difficulties, and excessive radiation exposure to healthcare personnel.  _________________________________________________________________________________________

## 2022-03-02 ENCOUNTER — Telehealth: Payer: Self-pay

## 2022-03-02 NOTE — Telephone Encounter (Signed)
Post procedure follow up..  Patient states he is doing good 

## 2022-03-04 ENCOUNTER — Other Ambulatory Visit: Payer: 59

## 2022-03-07 ENCOUNTER — Ambulatory Visit (INDEPENDENT_AMBULATORY_CARE_PROVIDER_SITE_OTHER): Payer: 59

## 2022-03-07 DIAGNOSIS — E1142 Type 2 diabetes mellitus with diabetic polyneuropathy: Secondary | ICD-10-CM

## 2022-03-07 DIAGNOSIS — M2142 Flat foot [pes planus] (acquired), left foot: Secondary | ICD-10-CM

## 2022-03-07 DIAGNOSIS — M2141 Flat foot [pes planus] (acquired), right foot: Secondary | ICD-10-CM

## 2022-03-07 NOTE — Progress Notes (Signed)
Patient presents today to pick up diabetic shoes and insoles.  Patient was dispensed 1 pair of diabetic shoes and 3 pairs of foam casted diabetic insoles.   He tried on the shoes with the insoles and the fit was satisfactory.   Will follow up next year for new order.

## 2022-03-14 ENCOUNTER — Other Ambulatory Visit: Payer: Self-pay | Admitting: Family Medicine

## 2022-03-14 DIAGNOSIS — R600 Localized edema: Secondary | ICD-10-CM

## 2022-03-15 ENCOUNTER — Telehealth: Payer: Self-pay | Admitting: Family Medicine

## 2022-03-15 NOTE — Telephone Encounter (Signed)
Pt is calling to see if [employer] sent the paper work to his PCP for his chair. Pt states that his chair has been broke for over a week. Pt states he needs his chair fixed so he can make it to his appts and to be able to get around. Please advise.

## 2022-03-15 NOTE — Telephone Encounter (Signed)
Requested medication (s) are due for refill today: No  Requested medication (s) are on the active medication list: Yes  Last refill:  02/15/22  Future visit scheduled:   Notes to clinic:  Protocol indicates labs needed.    Requested Prescriptions  Pending Prescriptions Disp Refills   furosemide (LASIX) 20 MG tablet [Pharmacy Med Name: Furosemide 20 MG Oral Tablet] 90 tablet 0    Sig: TAKE 1 TABLET BY MOUTH ONCE DAILY AS NEEDED FOR FLUID OR EDEMA     Cardiovascular:  Diuretics - Loop Failed - 03/14/2022  4:41 PM      Failed - K in normal range and within 180 days    Potassium  Date Value Ref Range Status  06/25/2021 4.1 3.5 - 5.3 mmol/L Final         Failed - Ca in normal range and within 180 days    Calcium  Date Value Ref Range Status  06/25/2021 9.0 8.6 - 10.3 mg/dL Final         Failed - Na in normal range and within 180 days    Sodium  Date Value Ref Range Status  06/25/2021 142 135 - 146 mmol/L Final  02/15/2021 137 134 - 144 mmol/L Final         Failed - Cr in normal range and within 180 days    Creat  Date Value Ref Range Status  06/25/2021 0.67 (L) 0.70 - 1.30 mg/dL Final         Failed - Cl in normal range and within 180 days    Chloride  Date Value Ref Range Status  06/25/2021 100 98 - 110 mmol/L Final         Failed - Mg Level in normal range and within 180 days    Magnesium  Date Value Ref Range Status  02/15/2021 1.8 1.6 - 2.3 mg/dL Final         Failed - Last BP in normal range    BP Readings from Last 1 Encounters:  03/01/22 (!) 121/103         Passed - Valid encounter within last 6 months    Recent Outpatient Visits           4 weeks ago Type 2 diabetes mellitus with other specified complication, with long-term current use of insulin ([place])   [place], Devonne Doughty, DO   4 months ago Type 2 diabetes mellitus with other specified complication, with long-term current use of insulin [facility])   [place], Alexander J, DO   8 months ago Annual physical exam   [place], DO   1 year ago Pain and swelling of toe of left foot   [place], Alexander J, DO   1 year ago DDD (degenerative disc disease), lumbar   [place], DO       Future Appointments             In 4 months [place], Devonne Doughty, DO [facility], [facility]

## 2022-03-17 ENCOUNTER — Ambulatory Visit: Payer: 59 | Attending: Pain Medicine | Admitting: Pain Medicine

## 2022-03-17 DIAGNOSIS — M150 Primary generalized (osteo)arthritis: Secondary | ICD-10-CM | POA: Diagnosis not present

## 2022-03-17 DIAGNOSIS — Z91199 Patient's noncompliance with other medical treatment and regimen due to unspecified reason: Secondary | ICD-10-CM

## 2022-03-17 DIAGNOSIS — G894 Chronic pain syndrome: Secondary | ICD-10-CM | POA: Diagnosis not present

## 2022-03-17 DIAGNOSIS — M5136 Other intervertebral disc degeneration, lumbar region: Secondary | ICD-10-CM | POA: Diagnosis not present

## 2022-03-17 DIAGNOSIS — R296 Repeated falls: Secondary | ICD-10-CM | POA: Diagnosis not present

## 2022-03-17 NOTE — Telephone Encounter (Signed)
I left a message for the patient to call back. I have not seen any paperwork so I was trying to get in touch with him to get some information if I can.

## 2022-03-17 NOTE — Progress Notes (Signed)
Unsuccessful attempt to contact patient for Virtual Visit (Pain Management Telehealth)   Patient provided contact information:  [phone] (home); [phone] (mobile); (Preferred) [phone] [email or web address]   Pre-screening:  Our staff was successful in contacting [name] using the above provided information.   I unsuccessfully attempted to make contact with James [name] on 3 different occasions on 03/17/2022 via telephone. I was unable to complete the virtual encounter due to call going directly to voicemail. I was able to leave a message where I clearly identify myself as James Cola, MD and I left a message to call us back to reschedule the call.  The information below was obtained from the nursing staff precharting interview.  Post-procedure evaluation   Procedure: Lumbar epidural steroid injection (LESI) (interlaminar) #2    Laterality: Right   Level:  L2-3 Level.  Imaging: Fluoroscopic guidance         Anesthesia: Local anesthesia (1-2% Lidocaine) Anxiolysis: IV Versed 1.5 mg Sedation: No Sedation                       DOS: 03/01/2022  Performed by: James Cola, MD  Purpose: Diagnostic/Therapeutic Indications: Lumbar radicular pain of intraspinal etiology of more than 4 weeks that has failed to respond to conservative therapy and is severe enough to impact quality of life or function. 1. 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 DDD (degenerative disc disease), lumbar   3. 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 Lumbar lateral recess stenosis (Right: L3-4) (Bilateral: L4-5)   6. Lumbar nerve root impingement (Right: L4)   7. Lumbosacral radiculopathy at L5 (Left)   8. Abnormal MRI, lumbar spine (05/21/2021)   9. Elevated C-reactive protein (CRP)   10. Elevated sed rate    Class 3 obesity with alveolar hypoventilation,  serious comorbidity, and body mass index (BMI) of 45.0 to 49.9 in adult [facility])    Type 2 diabetes mellitus with morbid obesity ([name])    NAS-11 Pain score:   Pre-procedure: 10-Worst pain ever/10   Post-procedure: 5 /10   Today we had a considerable amount of difficulty getting him into position secondary to his weight.  For this reason, I have informed the patient that I will not be repeating any more injections until he brings his BMI down to less than 35 kg/m.    Effectiveness:  Initial hour after procedure: 100 %. Subsequent 4-6 hours post-procedure: 100 %. Analgesia past initial 6 hours: 40 % (ongoing).  Pharmacotherapy Assessment  Analgesic: Oxycodone/APAP 10/325 (# 60/month), 1 tab p.o. twice daily (last filled on 02/03/2021) Needs to loose an average of 2.75 lbs/mo to be considered for opioid pharmacotherapy.  MME/day: 30 mg/day   Follow-up plan:   Reschedule Visit.     Interventional Therapies  Risk Factors  Considerations:   Goal: BMI down to 30 kg/m (Goal: 220 lbs for his height). Must lose 2.75 lbs/month. (03/01/2022 - 315 Lbs.)  Class 3 MO  SOBOE  T2DM  BA  HTN   Planned  Pending:   NO MORE PROCEDURES UNTIL BMI IS <35 (very hard to move onto bed)   Under consideration:   NO MORE PROCEDURES UNTIL BMI IS <35 (very hard to move onto bed)   Completed:   Therapeutic right L2-3 LESI x2 (03/01/2022) ( Diagnostic/therapeutic right L3-4 LESI x1 (11/04/21) (100/100/25/25) (100% of LBP)  Diagnostic/therapeutic right L4-5 LESI  x2 (11/30/2021) (100/100/100/80)  Toradol/Norflex IM 60/60 mg (03/29/2021)  Referral to orthopedic surgery for evaluation of knees and hip (03/29/2021)  Referral to medical weight management (02/15/2021)  Referral to bariatric surgery (02/15/2021)    Completed by other providers:   [place] [facility]  EMG/PNCV (LE) by Gurney Maxin, MD [facility] neurology) (03/16/2021) (chronic, severe sensorimotor polyneuropathy)    Therapeutic  Palliative (PRN) options:   None established      Recent Visits Date Type Provider Dept  03/01/22 Procedure visit Milinda Pointer, MD [facility]  02/16/22 Office Visit Milinda Pointer, MD [facility]  01/06/22 Office Visit Milinda Pointer, MD [facility]  12/23/21 Procedure visit Milinda Pointer, MD [facility]  Showing recent visits within past 90 days and meeting all other requirements Today's Visits Date Type Provider Dept  03/17/22 Office Visit Milinda Pointer, MD [facility]  Showing today's visits and meeting all other requirements Future Appointments Date Type Provider Dept  05/25/22 Appointment Milinda Pointer, MD [facility]  Showing future appointments within next 90 days and meeting all other requirements   Note by: James Cola, MD Date: 03/17/2022; Time: 12:04 PM

## 2022-03-17 NOTE — Patient Instructions (Addendum)
_________________________________________________________________________________________  Body mass index (BMI) Weight Management Required  URGENT: Dear James [name] your weight has been found to be adversely affecting your health. Aggressive action is immediately required. Talk to your primary care physician.  Body mass index (BMI) is a common tool for deciding whether a person has an appropriate body weight.  It measures a persons weight in relation to their height.   According to the [facility] of health (NIH): A BMI of less than 18.5 means that a person is underweight. A BMI of between 18.5 and 24.9 is ideal. A BMI of between 25 and 29.9 is overweight. A BMI over 30 indicates obesity.  Body Mass Index (BMI) Classification BMI level (kg/m2) Category Associated incidence of chronic pain  <18  Underweight   18.5-24.9 Ideal body weight   25-29.9 Overweight  20%  30-34.9 Obese (Class I)  68%  35-39.9 Severe obesity (Class II)  136%  >40 Extreme obesity (Class III)  254%   Your current Estimated body mass index is 42.72 kg/m as calculated from the following:   Height as of 03/01/22: 6' (1.829 m).   Weight as of 03/01/22: 315 lb (142.9 kg).  Morbidly Obese Classification: You will be considered to be "Morbidly Obese" if your BMI is above 30 and you have one or more of the following conditions caused or associated to obesity: 1.    Type 2 Diabetes (Leading to cardiovascular diseases (CVD), stroke, peripheral vascular diseases (PVD), retinopathy, nephropathy, and neuropathy) 2.    Cardiovascular Disease (High Blood Pressure; Congestive Heart Failure; High Cholesterol; Coronary Artery Disease; Angina; Arrhythmias, Dysrhythmias, or Heart Attacks) 3.    Breathing problems (Asthma; obesity-hypoventilation syndrome; obstructive sleep apnea; chronic inflammatory airway disease; reactive airway disease; or shortness of breath) 4.    Chronic kidney disease 5.    Liver disease  (nonalcoholic fatty liver disease) 6.    High blood pressure 7.    Acid reflux (gastroesophageal reflux disease; heartburn) 8.    Osteoarthritis (OA) (affecting the hip(s), the knee(s) and/or the lower back) 9.    Low back pain (Lumbar Facet Syndrome; and/or Degenerative Disc Disease) 10.  Hip pain (Osteoarthritis of hip) (For every 1 lbs of added body weight, there is a 2 lbs increase in pressure inside of each hip articulation. 1:2 mechanical relationship) 11.  Knee pain (Osteoarthritis of knee) (For every 1 lbs of added body weight, there is a 4 lbs increase in pressure inside of each knee articulation. 1:4 mechanical relationship) (patients with a BMI>30 kg/m2 were 6.8 times more likely to develop knee OA than normal-weight individuals) 12.  Cancer: Epidemiological studies have shown that obesity is a risk factor for: post-menopausal breast cancer; cancers of the endometrium, colon and kidney cancer; malignant adenomas of the oesophagus. Obese subjects have an approximately 1.5-3.5-fold increased risk of developing these cancers compared with normal-weight subjects, and it has been estimated that between 15 and 45% of these cancers can be attributed to overweight. More recent studies suggest that obesity may also increase the risk of other types of cancer, including pancreatic, hepatic and gallbladder cancer. (Ref: Obesity and cancer. Pischon T, Nthlings U, [employer] H. Proc Nutr Soc. 2008 May;67(2):128-45. doi: [id number].) The International Agency for Research on Cancer (IARC) has identified 13 cancers associated with overweight and obesity: meningioma, multiple myeloma, adenocarcinoma of the esophagus, and cancers of the thyroid, postmenopausal breast cancer, gallbladder, stomach, liver, pancreas, kidney, ovaries, uterus, colon and rectal (colorectal) cancers. 60 percent of all cancers diagnosed in women and  24 percent of those diagnosed in men are associated with overweight and  obesity.  Recommendation: At this point it is urgent that you take a step back and concentrate in loosing weight. Dedicate 100% of your efforts on this task. Nothing else will improve your health more than bringing your weight down and your BMI to less than 30. If you are here, you probably have chronic pain. We know that most chronic pain patients have difficulty exercising secondary to their pain. For this reason, you must rely on proper nutrition and diet in order to lose the weight. If your BMI is above 40, you should seriously consider bariatric surgery. A realistic goal is to lose 10% of your body weight over a period of 12 months.  Be honest to yourself, if over time you have unsuccessfully tried to lose weight, then it is time for you to seek professional help and to enter a medically supervised weight management program, and/or undergo bariatric surgery. Stop procrastinating.   Pain management considerations:  1.    Pharmacological Problems: Be advised that the use of opioid analgesics (oxycodone; hydrocodone; morphine; methadone; codeine; and all of their derivatives) have been associated with decreased metabolism and weight gain.  For this reason, should we see that you are unable to lose weight while taking these medications, it may become necessary for [place] to taper down and indefinitely discontinue them.  2.    Technical Problems: The incidence of successful interventional therapies decreases as the patient's BMI increases. It is much more difficult to accomplish a safe and effective interventional therapy on a patient with a BMI above 35. 3.    Radiation Exposure Problems: The x-rays machine, used to accomplish injection therapies, will automatically increase their x-ray output in order to capture an appropriate bone image. This means that radiation exposure increases exponentially with the patient's BMI. (The higher the BMI, the higher the radiation exposure.) Although the level of radiation  used at a given time is still safe to the patient, it is not for the physician and/or assisting staff. Unfortunately, radiation exposure is accumulative. Because physicians and the staff have to do procedures and be exposed on a daily basis, this can result in health problems such as cancer and radiation burns. Radiation exposure to the staff is monitored by the radiation batches that they wear. The exposure levels are reported back to the staff on a quarterly basis. Depending on levels of exposure, physicians and staff may be obligated by law to decrease this exposure. This means that they have the right and obligation to refuse providing therapies where they may be overexposed to radiation. For this reason, physicians may decline to offer therapies such as radiofrequency ablation or implants to patients with a BMI above 40. 4.    Current Trends: Be advised that the current trend is to no longer offer certain therapies to patients with a BMI equal to, or above 35, due to increase perioperative risks, increased technical procedural difficulties, and excessive radiation exposure to healthcare personnel.  _________________________________________________________________________________________   ____________________________________________________________________________________________  Opioid Pain Medication Update  To: All patients taking opioid pain medications. (I.e.: hydrocodone, hydromorphone, oxycodone, oxymorphone, morphine, codeine, methadone, tapentadol, tramadol, buprenorphine, fentanyl, etc.)  Re: Updated review of side effects and adverse reactions of opioid analgesics, as well as new information about long term effects of this class of medications.  Direct risks of long-term opioid therapy are not limited to opioid addiction and overdose. Potential medical risks include serious fractures, breathing problems during  sleep, hyperalgesia, immunosuppression, chronic constipation, bowel  obstruction, myocardial infarction, and tooth decay secondary to xerostomia.  Unpredictable adverse effects that can occur even if you take your medication correctly: Cognitive impairment, respiratory depression, and death. Most people think that if they take their medication "correctly", and "as instructed", that they will be safe. Nothing could be farther from the truth. In reality, a significant amount of recorded deaths associated with the use of opioids has occurred in individuals that had taken the medication for a long time, and were taking their medication correctly. The following are examples of how this can happen: Patient taking his/her medication for a long time, as instructed, without any side effects, is given a certain antibiotic or another unrelated medication, which in turn triggers a "Drug-to-drug interaction" leading to disorientation, cognitive impairment, impaired reflexes, respiratory depression or an untoward event leading to serious bodily harm or injury, including death.  Patient taking his/her medication for a long time, as instructed, without any side effects, develops an acute impairment of liver and/or kidney function. This will lead to a rapid inability of the body to breakdown and eliminate their pain medication, which will result in effects similar to an "overdose", but with the same medicine and dose that they had always taken. This again may lead to disorientation, cognitive impairment, impaired reflexes, respiratory depression or an untoward event leading to serious bodily harm or injury, including death.  A similar problem will occur with patients as they grow older and their liver and kidney function begins to decrease as part of the aging process.  Background information: Historically, the original case for using long-term opioid therapy to treat chronic noncancer pain was based on safety assumptions that subsequent experience has called into question. In 1996, the  American Pain Society and the [place] Academy [employer] Medicine issued a consensus statement supporting long-term opioid therapy. This statement acknowledged the dangers of opioid prescribing but concluded that the risk for addiction was low; respiratory depression induced by opioids was short-lived, occurred mainly in opioid-naive patients, and was antagonized by pain; tolerance was not a common problem; and efforts to control diversion should not constrain opioid prescribing. This has now proven to be wrong. Experience regarding the risks for opioid addiction, misuse, and overdose in community practice has failed to support these assumptions.  According to the Centers for Disease Control and Prevention, fatal overdoses involving opioid analgesics have increased sharply over the past decade. Currently, more than 96,700 people die from drug overdoses every year. Opioids are a factor in 7 out of every 10 overdose deaths. Deaths from drug overdose have surpassed motor vehicle accidents as the leading cause of death for individuals between the ages of 36 and 53.  Clinical data suggest that neuroendocrine dysfunction may be very common in both men and women, potentially causing hypogonadism, erectile dysfunction, infertility, decreased libido, osteoporosis, and depression. Recent studies linked higher opioid dose to increased opioid-related mortality. Controlled observational studies reported that long-term opioid therapy may be associated with increased risk for cardiovascular events. Subsequent meta-analysis concluded that the safety of long-term opioid therapy in elderly patients has not been proven.   Side Effects and adverse reactions: Common side effects: Drowsiness (sedation). Dizziness. Nausea and vomiting. Constipation. Physical dependence -- Dependence often manifests with withdrawal symptoms when opioids are discontinued or decreased. Tolerance -- As you take repeated doses of opioids, you  require increased medication to experience the same effect of pain relief. Respiratory depression -- This can occur in healthy people, especially  with higher doses. However, people with COPD, asthma or other lung conditions may be even more susceptible to fatal respiratory impairment.  Uncommon side effects: An increased sensitivity to feeling pain and extreme response to pain (hyperalgesia). Chronic use of opioids can lead to this. Delayed gastric emptying (the process by which the contents of your stomach are moved into your small intestine). Muscle rigidity. Immune system and hormonal dysfunction. Quick, involuntary muscle jerks (myoclonus). Arrhythmia. Itchy skin (pruritus). Dry mouth (xerostomia).  Long-term side effects: Chronic constipation. Sleep-disordered breathing (SDB). Increased risk of bone fractures. Hypothalamic-pituitary-adrenal dysregulation. Increased risk of overdose.  RISKS: Fractures and Falls:  Opioids increase the risk and incidence of falls. This is of particular importance in elderly patients.  Endocrine System:  Long-term administration is associated with endocrine abnormalities (endocrinopathies). (Also known as Opioid-induced Endocrinopathy) Influences on both the hypothalamic-pituitary-adrenal axis?and the hypothalamic-pituitary-gonadal axis have been demonstrated with consequent hypogonadism and adrenal insufficiency in both sexes. Hypogonadism and decreased levels of dehydroepiandrosterone sulfate have been reported in men and women. Endocrine effects include: Amenorrhoea in women (abnormal absence of menstruation) Reduced libido in both sexes Decreased sexual function Erectile dysfunction in men Hypogonadisms (decreased testicular function with shrinkage of testicles) Infertility Depression and fatigue Loss of muscle mass Anxiety Depression Immune suppression Hyperalgesia Weight gain Anemia Osteoporosis Patients (particularly women of  childbearing age) should avoid opioids. There is insufficient evidence to recommend routine monitoring of asymptomatic patients taking opioids in the long-term for hormonal deficiencies.  Immune System: Human studies have demonstrated that opioids have an immunomodulating effect. These effects are mediated via opioid receptors both on immune effector cells and in the central nervous system. Opioids have been demonstrated to have adverse effects on antimicrobial response and anti-tumour surveillance. Buprenorphine has been demonstrated to have no impact on immune function.  Opioid Induced Hyperalgesia: Human studies have demonstrated that prolonged use of opioids can lead to a state of abnormal pain sensitivity, sometimes called opioid induced hyperalgesia (OIH). Opioid induced hyperalgesia is not usually seen in the absence of tolerance to opioid analgesia. Clinically, hyperalgesia may be diagnosed if the patient on long-term opioid therapy presents with increased pain. This might be qualitatively and anatomically distinct from pain related to disease progression or to breakthrough pain resulting from development of opioid tolerance. Pain associated with hyperalgesia tends to be more diffuse than the pre-existing pain and less defined in quality. Management of opioid induced hyperalgesia requires opioid dose reduction.  Cancer: Chronic opioid therapy has been associated with an increased risk of cancer among noncancer patients with chronic pain. This association was more evident in chronic strong opioid users. Chronic opioid consumption causes significant pathological changes in the small intestine and colon. Epidemiological studies have found that there is a link between opium dependence and initiation of gastrointestinal cancers. Cancer is the second leading cause of death after cardiovascular disease. Chronic use of opioids can cause multiple conditions such as GERD, immunosuppression and renal  damage as well as carcinogenic effects, which are associated with the incidence of cancers.   Mortality: Long-term opioid use has been associated with increased mortality among patients with chronic non-cancer pain (CNCP).  Prescription of long-acting opioids for chronic noncancer pain was associated with a significantly increased risk of all-cause mortality, including deaths from causes other than overdose.  Reference: Von Korff M, Kolodny A, Deyo RA, Chou R. Long-term opioid therapy reconsidered. Ann Intern Med. 2011 Sep 6;155(5):325-8. doi: 10.7326/[id number]. PMID: [id number]; PMCIDXX:1631110. Alexandria Lodge RA, Maisie Fus, [place]  KP. Risk of adverse events in patients prescribed long-term opioids: A cohort study in the [place] Clinical Practice Research [employer]. Eur J Pain. 2019 May;23(5):908-922. doi: 10.1002/ejp.1357. Epub 2019 Jan 31. PMID: [id number]. Colameco S, Coren JS, Ciervo CA. Continuous opioid treatment for chronic noncancer pain: a time for moderation in prescribing. Postgrad Med. 2009 Jul;121(4):61-6. doi: 10.3810/pgm.2009.07.2032. PMID: [id number]. Heywood Bene RN, G[place] SD, Blazina I, Rosalio Loud, Bougatsos C, Deyo RA. The effectiveness and risks of long-term opioid therapy for chronic pain: a systematic review for a [employer] of Health Pathways to [employer]. Ann Intern Med. 2015 Feb 17;162(4):276-86. doi: [id number]. PMID: [id number]. Marjory Sneddon [facility], [place] DM. NCHS Data Brief No. 22. [place]: Centers for Disease Control and Prevention; 2009. Sep, Increase in Fatal Poisonings Involving Opioid Analgesics in the [place], 1999-2006. Song IA, Choi HR, Oh TK. Long-term opioid use and mortality in patients with chronic non-cancer pain: Ten-year follow-up study in [place] from 2010 through 2019. EClinicalMedicine. 2022 Jul 18;51:101558. doi: 10.1016/j.[id number].[id number]. PMID: [id number]; PMCIDYO:4697703. Huser, W., Schubert, T., Vogelmann, T. et al. All-cause mortality in patients with long-term opioid therapy compared with non-opioid analgesics for chronic non-cancer pain: a database study. [place] Med 18, 162 (2020). [email or web address]/ Rashidian H, Roxy Cedar, Malekzadeh R, Haghdoost AA. An Ecological Study of the Association between Opiate Use and Incidence of Cancers. Addict Health. 2016 Fall;8(4):252-260. PMID: [id number]; PMCIDYE:622990.  Our Goal: Our goal is to control your pain with means other than the use of opioid pain medications.  Our Recommendation: Talk to your physician about coming off of these medications. We can assist you with the tapering down and stopping these medicines. Based on the new information, even if you cannot completely stop the medication, a decrease in the dose may be associated with a lesser risk. Ask for other means of controlling the pain. Decrease or eliminate those factors that significantly contribute to your pain such as smoking, obesity, and a diet heavily tilted towards "inflammatory" nutrients.  Last Updated: 03/16/2022   ____________________________________________________________________________________________

## 2022-03-18 NOTE — Telephone Encounter (Signed)
Levada Dy from [facility] is calling to see if an expedited prior authorization can be sent to them to have the pt wheel chair fixed for him.   Please advise  Levada Dy phone number:  [phone]  [facility] fax number: [phone]

## 2022-03-19 DIAGNOSIS — E1142 Type 2 diabetes mellitus with diabetic polyneuropathy: Secondary | ICD-10-CM | POA: Diagnosis not present

## 2022-03-19 DIAGNOSIS — M47816 Spondylosis without myelopathy or radiculopathy, lumbar region: Secondary | ICD-10-CM | POA: Diagnosis not present

## 2022-03-19 DIAGNOSIS — M170 Bilateral primary osteoarthritis of knee: Secondary | ICD-10-CM | POA: Diagnosis not present

## 2022-03-23 NOTE — Progress Notes (Unsigned)
Patient: James [name]  Service Category: E/M  Provider: Gaspar Cola, MD  DOB: [birth year]  DOS: 03/24/2022  Location: Office  MRN: [id number]  Setting: Ambulatory outpatient  Referring Provider: Nobie Putnam *  Type: Established Patient  Specialty: Interventional Pain Management  PCP: Olin Hauser, DO  Location: Remote location  Delivery: TeleHealth     Virtual Encounter - Pain Management PROVIDER NOTE: Information contained herein reflects review and annotations entered in association with encounter. Interpretation of such information and data should be left to medically-trained personnel. Information provided to patient can be located elsewhere in the medical record under "Patient Instructions". Document created using STT-dictation technology, any transcriptional errors that may result from process are unintentional.    Contact & Pharmacy Preferred: [phone] Home: [phone] (home) Mobile: [phone] (mobile) E-mail: [email or web address]  [place] [address] [address] Phone: [phone] Fax: [place], [place] [address] Phone: [phone] Fax: [phone]  [id number] [place] [address]: [phone]: [address]: [phone]: [address] [address] [address] [address]: [phone]: [phone]  Pre-screening  [name] "in-person" vs "virtual" encounter. He indicated preferring virtual for this encounter.   Reason COVID-19*  Social distancing based on CDC and AMA  recommendations.   I contacted James Birchwoodon 03/24/2022 via telephone.      I clearly identified myself as FGaspar Cola MD. I verified that I was speaking with the correct person using two identifiers (Name: James [name] and date of birth: [birth year].  Consent I sought verbal advanced consent from James Birchwoodfor virtual visit interactions. I informed Mr. SSpradleyof possible security and privacy concerns, risks, and limitations associated with providing "not-in-person" medical evaluation and management services. I also informed Mr. SSchellingerof the availability of "in-person" appointments. Finally, I informed him that there would be a charge for the virtual visit and that he could be  personally, fully or partially, financially responsible for it. Mr. SCongdonexpressed understanding and agreed to proceed.   Historic Elements   Mr. James Wimpeyis a 62y.o. year old, male patient evaluated today after our last contact on 03/17/2022. [name] has a past medical history of Arthritis, Diabetes mellitus without complication ([place], Hyperlipidemia, and Hypertension. He also  has a past surgical history that includes Colonoscopy with propofol; Appendectomy; and Colonoscopy with propofol (N/A, 04/23/2021). [name] a current medication list which includes the following prescription(s): albuterol, albuterol, atorvastatin, dapagliflozin propanediol, fluticasone, furosemide, hydrochlorothiazide, tresiba flextouch, victoza, losartan, metformin, naloxone, oxycodone hcl, [START ON 04/01/2022] oxycodone hcl, [START ON 05/01/2022] oxycodone hcl, ozempic (1 mg/dose), pantoprazole, pregabalin, and varenicline. He  reports that he has been smoking cigarettes. He has a 2.50 pack-year smoking history. He has never used smokeless tobacco. He reports that he does not drink alcohol and does not use drugs. [name] No Known Allergies.  BMI: Estimated body mass index is 42.72  kg/m as calculated from the following:   Height as of 03/01/22: 6' (1.829 m).   Weight as of 03/01/22: 315 lb (142.9 kg). Last encounter: 03/17/2022. Last procedure:  03/01/2022.  HPI  Today, he is being contacted for a post-procedure assessment.  Post-procedure evaluation   Procedure: Lumbar epidural steroid injection (LESI) (interlaminar) #2    Laterality: Right   Level:  L2-3 Level.  Imaging: Fluoroscopic guidance         Anesthesia: Local anesthesia (1-2% Lidocaine) Anxiolysis: IV Versed 1.5 mg Sedation: No Sedation                       DOS: 03/01/2022  Performed by: Gaspar Cola, MD  Purpose: Diagnostic/Therapeutic Indications: Lumbar radicular pain of intraspinal etiology of more than 4 weeks that has failed to respond to conservative therapy and is severe enough to impact quality of life or function. 1. 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 DDD (degenerative disc disease), lumbar   3. 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 Lumbar lateral recess stenosis (Right: L3-4) (Bilateral: L4-5)   6. Lumbar nerve root impingement (Right: L4)   7. Lumbosacral radiculopathy at L5 (Left)   8. Abnormal MRI, lumbar spine (05/21/2021)   9. Elevated C-reactive protein (CRP)   10. Elevated sed rate    Class 3 obesity with alveolar hypoventilation, serious comorbidity, and body mass index (BMI) of 45.0 to 49.9 in adult [facility])    Type 2 diabetes mellitus with morbid obesity ([place])    NAS-11 Pain score:   Pre-procedure: 10-Worst pain ever/10   Post-procedure: 5 /10   Today we had a considerable amount of difficulty getting him into position secondary to his weight.  For this reason, I have informed the patient that I will not be repeating any more injections until he brings his BMI down to less than 35 kg/m.     Effectiveness:  Initial hour after procedure:    ***. Subsequent 4-6 hours post-procedure:   ***. Analgesia past initial 6 hours:   ***. Ongoing improvement:  Analgesic:  *** Function:    ***    ROM:    ***     Pharmacotherapy Assessment   Opioid Analgesic: Oxycodone/APAP 10/325 (# 60/month), 1 tab p.o. twice daily (last filled on 02/03/2021) Needs to loose an average of 2.75 lbs/mo to be considered for opioid pharmacotherapy.  MME/day: 30 mg/day   Monitoring: [place] PMP: PDMP reviewed during this encounter.       Pharmacotherapy: No side-effects or adverse reactions reported. Compliance: No problems identified. Effectiveness: Clinically acceptable. Plan: Refer to "POC". UDS:  Summary  Date Value Ref Range Status  02/16/2022 Note  Final    Comment:    ==================================================================== [employer] Select 13 (MW) ==================================================================== Test                             Result       Flag       Units  Drug Present and Declared for Prescription Verification   Oxycodone                      2223         EXPECTED   ng/mg creat   Oxymorphone                    1208         EXPECTED   ng/mg creat   Noroxycodone  3040         EXPECTED   ng/mg creat   Noroxymorphone                 475          EXPECTED   ng/mg creat    Sources of oxycodone are scheduled prescription medications.    Oxymorphone, noroxycodone, and noroxymorphone are expected    metabolites of oxycodone. Oxymorphone is also available as a    scheduled prescription medication.  ==================================================================== Test                      Result    Flag   Units      Ref Range   Creatinine              93               mg/dL      >=20 ==================================================================== Declared Medications:  The flagging and interpretation on this report are based on the  following declared medications.  Unexpected results may  arise from  inaccuracies in the declared medications.   **Note: The testing scope of this panel includes these medications:   Oxycodone   **Note: The testing scope of this panel does not include the  following reported medications:   Albuterol  Atorvastatin  Dapagliflozin (Farxiga)  Fluticasone (Flovent HFA)  Furosemide (Lasix)  Hydrochlorothiazide  Insulin Tyler Aas)  Liraglutide (Victoza)  Losartan (Cozaar)  Metformin  Naloxone (Narcan)  Pantoprazole (Protonix)  Pregabalin (Lyrica)  Semaglutide (Ozempic)  Varenicline (Chantix) ==================================================================== For clinical consultation, please call [phone]. ====================================================================    No results found for: "CBDTHCR", "D8THCCBX", "D9THCCBX"   Laboratory Chemistry Profile   Renal Lab Results  Component Value Date   BUN 12 06/25/2021   CREATININE 0.67 (L) 06/25/2021   BCR 18 06/25/2021   GFRNONAA >60 10/30/2020    Hepatic Lab Results  Component Value Date   AST 10 06/25/2021   ALT 8 (L) 06/25/2021   ALBUMIN 4.1 02/15/2021   ALKPHOS 116 02/15/2021    Electrolytes Lab Results  Component Value Date   NA 142 06/25/2021   K 4.1 06/25/2021   CL 100 06/25/2021   CALCIUM 9.0 06/25/2021   MG 1.8 02/15/2021    Bone Lab Results  Component Value Date   25OHVITD1 13 (L) 02/15/2021   25OHVITD2 <1.0 02/15/2021   25OHVITD3 12 02/15/2021    Inflammation (CRP: Acute Phase) (ESR: Chronic Phase) Lab Results  Component Value Date   CRP 29 (H) 02/15/2021   ESRSEDRATE 39 (H) 02/15/2021         Note: Above Lab results reviewed.  Imaging  DG PAIN CLINIC C-ARM 1-60 MIN NO REPORT Fluoro was used, but no Radiologist interpretation will be provided.  Please refer to "NOTES" tab for provider progress note.  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  Diagnoses of Chronic lower extremity pain (1ry area of Pain) (Bilateral), Chronic hip pain (2ry area of Pain) (Bilateral), Chronic knee pain (3ry area of Pain) (Bilateral), and Class 3 obesity with alveolar hypoventilation, serious comorbidity, and body mass index (BMI) of 45.0 to 49.9 in adult [facility]) were also pertinent to this visit.  Plan of Care  Problem-specific:  No problem-specific Assessment & Plan notes found for this encounter.  Mr. Rutherford Groves has a current medication list which includes the following long-term medication(s): albuterol, albuterol, atorvastatin, fluticasone, furosemide, hydrochlorothiazide,  losartan, metformin, oxycodone hcl, [START ON 04/01/2022] oxycodone hcl, [START ON 05/01/2022] oxycodone hcl, pantoprazole, and pregabalin.  Pharmacotherapy (Medications Ordered): No orders of the defined types were placed in this encounter.  Orders:  No orders of the defined types were placed in this encounter.  Follow-up plan:   No follow-ups on file.      Interventional Therapies  Risk Factors  Considerations:   Goal: BMI down to 30 kg/m (Goal: 220 lbs for his height). Must lose 2.75 lbs/month. (03/01/2022 - 315 Lbs.)  Class 3 MO  SOBOE  T2DM  BA  HTN   Planned  Pending:   NO MORE PROCEDURES UNTIL BMI IS <35 (very hard to move onto bed)   Under consideration:   NO MORE PROCEDURES UNTIL BMI IS <35 (very hard to move onto bed)   Completed:   Therapeutic right L2-3 LESI x2 (03/01/2022) ( Diagnostic/therapeutic right L3-4 LESI x1 (11/04/21) (100/100/25/25) (100% of LBP)  Diagnostic/therapeutic right L4-5 LESI x2 (11/30/2021) (100/100/100/80)  Toradol/Norflex IM 60/60 mg (03/29/2021)  Referral to orthopedic surgery for evaluation of knees and hip (03/29/2021)  Referral to medical weight management (02/15/2021)  Referral to bariatric surgery (02/15/2021)    Completed by other providers:   [facility]  EMG/PNCV (LE) by Gurney Maxin, MD [facility] neurology) (03/16/2021) (chronic, severe sensorimotor polyneuropathy)   Therapeutic  Palliative (PRN) options:   None established        Recent Visits Date Type Provider Dept  03/17/22 Office Visit Milinda Pointer, MD [facility]  03/01/22 Procedure visit Milinda Pointer, MD [facility]  02/16/22 Office Visit Milinda Pointer, MD [facility]  01/06/22 Office Visit Milinda Pointer, MD [facility]  12/23/21 Procedure visit Milinda Pointer, MD [facility]  Showing recent visits within past 90 days and meeting all other requirements Future Appointments Date Type Provider Dept  03/24/22 Appointment Milinda Pointer, MD [facility]  05/25/22 Appointment Milinda Pointer, MD [facility]  Showing future appointments within next 90 days and meeting all other requirements  I discussed the assessment and treatment plan with the patient. The patient was provided an opportunity to ask questions and all were answered. The patient agreed with the plan and demonstrated an understanding of the instructions.  Patient advised to call back or seek an in-person evaluation if the symptoms or condition worsens.  Duration of encounter: *** minutes.  Note by: Gaspar Cola, MD Date: 03/24/2022; Time: 7:33 AM

## 2022-03-24 ENCOUNTER — Ambulatory Visit: Payer: 59 | Attending: Pain Medicine | Admitting: Pain Medicine

## 2022-03-24 DIAGNOSIS — M5441 Lumbago with sciatica, right side: Secondary | ICD-10-CM

## 2022-03-24 DIAGNOSIS — M25552 Pain in left hip: Secondary | ICD-10-CM | POA: Diagnosis not present

## 2022-03-24 DIAGNOSIS — M5442 Lumbago with sciatica, left side: Secondary | ICD-10-CM

## 2022-03-24 DIAGNOSIS — M79605 Pain in left leg: Secondary | ICD-10-CM | POA: Diagnosis not present

## 2022-03-24 DIAGNOSIS — G8929 Other chronic pain: Secondary | ICD-10-CM | POA: Diagnosis not present

## 2022-03-24 DIAGNOSIS — M25551 Pain in right hip: Secondary | ICD-10-CM | POA: Diagnosis not present

## 2022-03-24 DIAGNOSIS — M79604 Pain in right leg: Secondary | ICD-10-CM | POA: Diagnosis not present

## 2022-03-24 DIAGNOSIS — M25561 Pain in right knee: Secondary | ICD-10-CM | POA: Diagnosis not present

## 2022-03-24 DIAGNOSIS — M25562 Pain in left knee: Secondary | ICD-10-CM | POA: Diagnosis not present

## 2022-03-24 DIAGNOSIS — J4530 Mild persistent asthma, uncomplicated: Secondary | ICD-10-CM | POA: Diagnosis not present

## 2022-03-24 DIAGNOSIS — Z6841 Body Mass Index (BMI) 40.0 and over, adult: Secondary | ICD-10-CM

## 2022-03-24 DIAGNOSIS — E662 Morbid (severe) obesity with alveolar hypoventilation: Secondary | ICD-10-CM

## 2022-03-24 NOTE — Patient Instructions (Signed)
_________________________________________________________________________________________  Body mass index (BMI) Weight Management Required  URGENT: Dear James [name] your weight has been found to be adversely affecting your health. Aggressive action is immediately required. Talk to your primary care physician.  Body mass index (BMI) is a common tool for deciding whether a person has an appropriate body weight.  It measures a persons weight in relation to their height.   According to the [facility] of health (NIH): A BMI of less than 18.5 means that a person is underweight. A BMI of between 18.5 and 24.9 is ideal. A BMI of between 25 and 29.9 is overweight. A BMI over 30 indicates obesity.  Body Mass Index (BMI) Classification BMI level (kg/m2) Category Associated incidence of chronic pain  <18  Underweight   18.5-24.9 Ideal body weight   25-29.9 Overweight  20%  30-34.9 Obese (Class I)  68%  35-39.9 Severe obesity (Class II)  136%  >40 Extreme obesity (Class III)  254%   Your current Estimated body mass index is 42.72 kg/m as calculated from the following:   Height as of 03/01/22: 6' (1.829 m).   Weight as of 03/01/22: 315 lb (142.9 kg).  Morbidly Obese Classification: You will be considered to be "Morbidly Obese" if your BMI is above 30 and you have one or more of the following conditions caused or associated to obesity: 1.    Type 2 Diabetes (Leading to cardiovascular diseases (CVD), stroke, peripheral vascular diseases (PVD), retinopathy, nephropathy, and neuropathy) 2.    Cardiovascular Disease (High Blood Pressure; Congestive Heart Failure; High Cholesterol; Coronary Artery Disease; Angina; Arrhythmias, Dysrhythmias, or Heart Attacks) 3.    Breathing problems (Asthma; obesity-hypoventilation syndrome; obstructive sleep apnea; chronic inflammatory airway disease; reactive airway disease; or shortness of breath) 4.    Chronic kidney disease 5.    Liver disease  (nonalcoholic fatty liver disease) 6.    High blood pressure 7.    Acid reflux (gastroesophageal reflux disease; heartburn) 8.    Osteoarthritis (OA) (affecting the hip(s), the knee(s) and/or the lower back) 9.    Low back pain (Lumbar Facet Syndrome; and/or Degenerative Disc Disease) 10.  Hip pain (Osteoarthritis of hip) (For every 1 lbs of added body weight, there is a 2 lbs increase in pressure inside of each hip articulation. 1:2 mechanical relationship) 11.  Knee pain (Osteoarthritis of knee) (For every 1 lbs of added body weight, there is a 4 lbs increase in pressure inside of each knee articulation. 1:4 mechanical relationship) (patients with a BMI>30 kg/m2 were 6.8 times more likely to develop knee OA than normal-weight individuals) 12.  Cancer: Epidemiological studies have shown that obesity is a risk factor for: post-menopausal breast cancer; cancers of the endometrium, colon and kidney cancer; malignant adenomas of the oesophagus. Obese subjects have an approximately 1.5-3.5-fold increased risk of developing these cancers compared with normal-weight subjects, and it has been estimated that between 15 and 45% of these cancers can be attributed to overweight. More recent studies suggest that obesity may also increase the risk of other types of cancer, including pancreatic, hepatic and gallbladder cancer. (Ref: Obesity and cancer. Pischon T, Nthlings U, [employer] H. Proc Nutr Soc. 2008 May;67(2):128-45. doi: N3840775.) The International Agency for Research on Cancer (IARC) has identified 13 cancers associated with overweight and obesity: meningioma, multiple myeloma, adenocarcinoma of the esophagus, and cancers of the thyroid, postmenopausal breast cancer, gallbladder, stomach, liver, pancreas, kidney, ovaries, uterus, colon and rectal (colorectal) cancers. 11 percent of all cancers diagnosed in women and  24 percent of those diagnosed in men are associated with overweight and  obesity.  Recommendation: At this point it is urgent that you take a step back and concentrate in loosing weight. Dedicate 100% of your efforts on this task. Nothing else will improve your health more than bringing your weight down and your BMI to less than 30. If you are here, you probably have chronic pain. We know that most chronic pain patients have difficulty exercising secondary to their pain. For this reason, you must rely on proper nutrition and diet in order to lose the weight. If your BMI is above 40, you should seriously consider bariatric surgery. A realistic goal is to lose 10% of your body weight over a period of 12 months.  Be honest to yourself, if over time you have unsuccessfully tried to lose weight, then it is time for you to seek professional help and to enter a medically supervised weight management program, and/or undergo bariatric surgery. Stop procrastinating.   Pain management considerations:  1.    Pharmacological Problems: Be advised that the use of opioid analgesics (oxycodone; hydrocodone; morphine; methadone; codeine; and all of their derivatives) have been associated with decreased metabolism and weight gain.  For this reason, should we see that you are unable to lose weight while taking these medications, it may become necessary for [place] to taper down and indefinitely discontinue them.  2.    Technical Problems: The incidence of successful interventional therapies decreases as the patient's BMI increases. It is much more difficult to accomplish a safe and effective interventional therapy on a patient with a BMI above 35. 3.    Radiation Exposure Problems: The x-rays machine, used to accomplish injection therapies, will automatically increase their x-ray output in order to capture an appropriate bone image. This means that radiation exposure increases exponentially with the patient's BMI. (The higher the BMI, the higher the radiation exposure.) Although the level of radiation  used at a given time is still safe to the patient, it is not for the physician and/or assisting staff. Unfortunately, radiation exposure is accumulative. Because physicians and the staff have to do procedures and be exposed on a daily basis, this can result in health problems such as cancer and radiation burns. Radiation exposure to the staff is monitored by the radiation batches that they wear. The exposure levels are reported back to the staff on a quarterly basis. Depending on levels of exposure, physicians and staff may be obligated by law to decrease this exposure. This means that they have the right and obligation to refuse providing therapies where they may be overexposed to radiation. For this reason, physicians may decline to offer therapies such as radiofrequency ablation or implants to patients with a BMI above 40. 4.    Current Trends: Be advised that the current trend is to no longer offer certain therapies to patients with a BMI equal to, or above 35, due to increase perioperative risks, increased technical procedural difficulties, and excessive radiation exposure to healthcare personnel.  _________________________________________________________________________________________

## 2022-03-25 ENCOUNTER — Ambulatory Visit (INDEPENDENT_AMBULATORY_CARE_PROVIDER_SITE_OTHER): Payer: 59

## 2022-03-25 VITALS — Ht 74.0 in | Wt 315.0 lb

## 2022-03-25 DIAGNOSIS — Z0000 Encounter for general adult medical examination without abnormal findings: Secondary | ICD-10-CM

## 2022-03-25 NOTE — Patient Instructions (Signed)
James [name] , Thank you for taking time to come for your Medicare Wellness Visit. I appreciate your ongoing commitment to your health goals. Please review the following plan we discussed and let me know if I can assist you in the future.   These are the goals we discussed:  Goals      DIET - EAT MORE FRUITS AND VEGETABLES     DIET - INCREASE WATER INTAKE        This is a list of the screening recommended for you and due dates:  Health Maintenance  Topic Date Due   HIV Screening  Never done   Yearly kidney health urinalysis for diabetes  Never done   Hepatitis C Screening: USPSTF Recommendation to screen - Ages 41-79 yo.  Never done   DTaP/Tdap/Td vaccine (1 - Tdap) Never done   COVID-19 Vaccine (4 - 2023-24 season) 09/17/2021   Zoster (Shingles) Vaccine (2 of 2) 01/07/2022   Yearly kidney function blood test for diabetes  06/26/2022   Eye exam for diabetics  07/30/2022   Hemoglobin A1C  08/16/2022   Complete foot exam   02/16/2023   Medicare Annual Wellness Visit  03/25/2023   Colon Cancer Screening  04/23/2024   Flu Shot  Completed   HPV Vaccine  Aged Out    Advanced directives: NO  Conditions/risks identified: NONE  Next appointment: Follow up in one year for your annual wellness visit 03/31/23 @ 10:00 AM BY PHONE  Preventive Care 40-64 Years, Male Preventive care refers to lifestyle choices and visits with your health care provider that can promote health and wellness. What does preventive care include? A yearly physical exam. This is also called an annual well check. Dental exams once or twice a year. Routine eye exams. Ask your health care provider how often you should have your eyes checked. Personal lifestyle choices, including: Daily care of your teeth and gums. Regular physical activity. Eating a healthy diet. Avoiding tobacco and drug use. Limiting alcohol use. Practicing safe sex. Taking low-dose aspirin every day starting at age 85. What happens during  an annual well check? The services and screenings done by your health care provider during your annual well check will depend on your age, overall health, lifestyle risk factors, and family history of disease. Counseling  Your health care provider may ask you questions about your: Alcohol use. Tobacco use. Drug use. Emotional well-being. Home and relationship well-being. Sexual activity. Eating habits. Work and work Statistician. Screening  You may have the following tests or measurements: Height, weight, and BMI. Blood pressure. Lipid and cholesterol levels. These may be checked every 5 years, or more frequently if you are over 17 years old. Skin check. Lung cancer screening. You may have this screening every year starting at age 48 if you have a 30-pack-year history of smoking and currently smoke or have quit within the past 15 years. Fecal occult blood test (FOBT) of the stool. You may have this test every year starting at age 59. Flexible sigmoidoscopy or colonoscopy. You may have a sigmoidoscopy every 5 years or a colonoscopy every 10 years starting at age 15. Prostate cancer screening. Recommendations will vary depending on your family history and other risks. Hepatitis C blood test. Hepatitis B blood test. Sexually transmitted disease (STD) testing. Diabetes screening. This is done by checking your blood sugar (glucose) after you have not eaten for a while (fasting). You may have this done every 1-3 years. Discuss your test results, treatment options, and  if necessary, the need for more tests with your health care provider. Vaccines  Your health care provider may recommend certain vaccines, such as: Influenza vaccine. This is recommended every year. Tetanus, diphtheria, and acellular pertussis (Tdap, Td) vaccine. You may need a Td booster every 10 years. Zoster vaccine. You may need this after age 46. Pneumococcal 13-valent conjugate (PCV13) vaccine. You may need this if you  have certain conditions and have not been vaccinated. Pneumococcal polysaccharide (PPSV23) vaccine. You may need one or two doses if you smoke cigarettes or if you have certain conditions. Talk to your health care provider about which screenings and vaccines you need and how often you need them. This information is not intended to replace advice given to you by your health care provider. Make sure you discuss any questions you have with your health care provider. Document Released: 01/30/2015 Document Revised: 09/23/2015 Document Reviewed: 11/04/2014 Elsevier Interactive Patient Education  2017 [place] Prevention in the Home Falls can cause injuries. They can happen to people of all ages. There are many things you can do to make your home safe and to help prevent falls. What can I do on the outside of my home? Regularly fix the edges of walkways and driveways and fix any cracks. Remove anything that might make you trip as you walk through a door, such as a raised step or threshold. Trim any bushes or trees on the path to your home. Use bright outdoor lighting. Clear any walking paths of anything that might make someone trip, such as rocks or tools. Regularly check to see if handrails are loose or broken. Make sure that both sides of any steps have handrails. Any raised decks and porches should have guardrails on the edges. Have any leaves, snow, or ice cleared regularly. Use sand or salt on walking paths during winter. Clean up any spills in your garage right away. This includes oil or grease spills. What can I do in the bathroom? Use night lights. Install grab bars by the toilet and in the tub and shower. Do not use towel bars as grab bars. Use non-skid mats or decals in the tub or shower. If you need to sit down in the shower, use a plastic, non-slip stool. Keep the floor dry. Clean up any water that spills on the floor as soon as it happens. Remove soap buildup in the tub or  shower regularly. Attach bath mats securely with double-sided non-slip rug tape. Do not have throw rugs and other things on the floor that can make you trip. What can I do in the bedroom? Use night lights. Make sure that you have a light by your bed that is easy to reach. Do not use any sheets or blankets that are too big for your bed. They should not hang down onto the floor. Have a firm chair that has side arms. You can use this for support while you get dressed. Do not have throw rugs and other things on the floor that can make you trip. What can I do in the kitchen? Clean up any spills right away. Avoid walking on wet floors. Keep items that you use a lot in easy-to-reach places. If you need to reach something above you, use a strong step stool that has a grab bar. Keep electrical cords out of the way. Do not use floor polish or wax that makes floors slippery. If you must use wax, use non-skid floor wax. Do not have throw rugs  and other things on the floor that can make you trip. What can I do with my stairs? Do not leave any items on the stairs. Make sure that there are handrails on both sides of the stairs and use them. Fix handrails that are broken or loose. Make sure that handrails are as long as the stairways. Check any carpeting to make sure that it is firmly attached to the stairs. Fix any carpet that is loose or worn. Avoid having throw rugs at the top or bottom of the stairs. If you do have throw rugs, attach them to the floor with carpet tape. Make sure that you have a light switch at the top of the stairs and the bottom of the stairs. If you do not have them, ask someone to add them for you. What else can I do to help prevent falls? Wear shoes that: Do not have high heels. Have rubber bottoms. Are comfortable and fit you well. Are closed at the toe. Do not wear sandals. If you use a stepladder: Make sure that it is fully opened. Do not climb a closed stepladder. Make  sure that both sides of the stepladder are locked into place. Ask someone to hold it for you, if possible. Clearly mark and make sure that you can see: Any grab bars or handrails. First and last steps. Where the edge of each step is. Use tools that help you move around (mobility aids) if they are needed. These include: Canes. Walkers. Scooters. Crutches. Turn on the lights when you go into a dark area. Replace any light bulbs as soon as they burn out. Set up your furniture so you have a clear path. Avoid moving your furniture around. If any of your floors are uneven, fix them. If there are any pets around you, be aware of where they are. Review your medicines with your doctor. Some medicines can make you feel dizzy. This can increase your chance of falling. Ask your doctor what other things that you can do to help prevent falls. This information is not intended to replace advice given to you by your health care provider. Make sure you discuss any questions you have with your health care provider. Document Released: 10/30/2008 Document Revised: 06/11/2015 Document Reviewed: 02/07/2014 Elsevier Interactive Patient Education  2017 [employer].

## 2022-03-25 NOTE — Progress Notes (Signed)
I connected with  [name] on 03/25/22 by a audio enabled telemedicine application and verified that I am speaking with the correct person using two identifiers.  Patient Location: Home  Provider Location: Office/Clinic  I discussed the limitations of evaluation and management by telemedicine. The patient expressed understanding and agreed to proceed.  Subjective:   James [name] is a 62 y.o. male who presents for Medicare Annual/Subsequent preventive examination.  Review of Systems     Cardiac Risk Factors include: advanced age (>34mn, >>95women);diabetes mellitus;male gender     Objective:    Today's Vitals   03/25/22 1030  PainSc: 5    There is no height or weight on file to calculate BMI.     03/25/2022   10:36 AM 02/16/2022   10:58 AM 12/23/2021    9:05 AM 11/30/2021    9:30 AM 11/15/2021    9:45 AM 11/04/2021    8:59 AM 08/17/2021   11:40 AM  Advanced Directives  Does Patient Have a Medical Advance Directive? No No No No No No No  Would patient like information on creating a medical advance directive? No - Patient declined No - Patient declined No - Patient declined  No - Patient declined  No - Patient declined    Current Medications (verified) Outpatient Encounter Medications as of 03/25/2022  Medication Sig   albuterol (PROAIR HFA) 108 (90 Base) MCG/ACT inhaler Inhale 1-2 puffs into the lungs every 6 (six) hours as needed for wheezing or shortness of breath.   albuterol (PROVENTIL) (2.5 MG/3ML) 0.083% nebulizer solution Take 355m(2.'5mg'$ ) by nebulization every 6 hours and as needed for shortness of breath and wheezing.   atorvastatin (LIPITOR) 40 MG tablet Take 1 tablet (40 mg total) by mouth daily.   furosemide (LASIX) 20 MG tablet TAKE 1 TABLET BY MOUTH ONCE DAILY AS NEEDED FOR FLUID OR EDEMA   hydrochlorothiazide (HYDRODIURIL) 25 MG tablet Take 1 tablet (25 mg total) by mouth daily.   losartan (COZAAR) 25 MG tablet Take 1 tablet (25 mg total) by mouth  daily.   metFORMIN (GLUCOPHAGE) 1000 MG tablet TAKE 1 TABLET BY MOUTH WITH BREAKFAST   naloxone (NARCAN) nasal spray 4 mg/0.1 mL Place 1 spray into the nose as needed for up to 365 doses (for opioid-induced respiratory depresssion). In case of emergency (overdose), spray once into each nostril. If no response within 3 minutes, repeat application and call [phone]  Oxycodone HCl 10 MG TABS Take 1 tablet (10 mg total) by mouth in the morning and at bedtime. Must last 30 days.   [START ON 04/01/2022] Oxycodone HCl 10 MG TABS Take 1 tablet (10 mg total) by mouth in the morning and at bedtime. Must last 30 days.   [START ON 05/01/2022] Oxycodone HCl 10 MG TABS Take 1 tablet (10 mg total) by mouth in the morning and at bedtime. Must last 30 days.   OZEMPIC, 1 MG/DOSE, 4 MG/3ML SOPN Inject 1 mg into the skin once a week.   pregabalin (LYRICA) 150 MG capsule TAKE 2 CAPSULES BY MOUTH IN THE MORNING AND 1 AT NIGHT   varenicline (CHANTIX CONTINUING MONTH PAK) 1 MG tablet TK 1 T PO BID   dapagliflozin propanediol (FARXIGA) 10 MG TABS tablet TK 1 T PO QD (Patient not taking: Reported on 03/25/2022)   fluticasone (FLOVENT HFA) 110 MCG/ACT inhaler INL 1 PUFF PO BID (Patient not taking: Reported on 03/25/2022)   insulin degludec ([name] FLEXTOUCH) 100 UNIT/ML FlexTouch Pen INJECT 32 UNITS SQ  ONCE A DAY (Patient not taking: Reported on 03/25/2022)   liraglutide (VICTOZA) 18 MG/3ML SOPN ADM 0.6 MG [place] QD WF (Patient not taking: Reported on 03/25/2022)   pantoprazole (PROTONIX) 40 MG tablet TK 1 T PO QD (Patient not taking: Reported on 03/25/2022)   No facility-administered encounter medications on file as of 03/25/2022.    Allergies (verified) Patient has no known allergies.   History: Past Medical History:  Diagnosis Date   Arthritis    Diabetes mellitus without complication ([place])    Hyperlipidemia    Hypertension    Past Surgical History:  Procedure Laterality Date   APPENDECTOMY     COLONOSCOPY WITH PROPOFOL      COLONOSCOPY WITH PROPOFOL N/A 04/23/2021   Procedure: COLONOSCOPY WITH PROPOFOL;  Surgeon: Jonathon Bellows, MD;  [facility] ENDOSCOPY;  Service: Gastroenterology;  Laterality: N/A;   Family History  Problem Relation Age of Onset   Heart disease Mother    Arthritis Mother    Arthritis Father    Social History   Socioeconomic History   Marital status: Single    Spouse name: Not on file   Number of children: Not on file   Years of education: Not on file   Highest education level: Not on file  Occupational History   Not on file  Tobacco Use   Smoking status: Some Days    Packs/day: 0.10    Years: 25.00    Total pack years: 2.50    Types: Cigarettes   Smokeless tobacco: Never  Vaping Use   Vaping Use: Never used  Substance and Sexual Activity   Alcohol use: Never   Drug use: Never   Sexual activity: Not on file  Other Topics Concern   Not on file  Social History Narrative   Not on file   Social Determinants of Health   Financial Resource Strain: Low Risk  (03/25/2022)   Overall Financial Resource Strain (CARDIA)    Difficulty of Paying Living Expenses: Not hard at all  Food Insecurity: No Food Insecurity (03/25/2022)   Hunger Vital Sign    Worried About Running Out of Food in the Last Year: Never true    [place] in the Last Year: Never true  Transportation Needs: No Transportation Needs (03/25/2022)   PRAPARE - Hydrologist (Medical): No    Lack of Transportation (Non-Medical): No  Physical Activity: Inactive (03/25/2022)   Exercise Vital Sign    Days of Exercise per Week: 0 days    Minutes of Exercise per Session: 0 min  Stress: No Stress Concern Present (03/25/2022)   [place]    Feeling of Stress : Only a little  Social Connections: Socially Isolated (03/25/2022)   Social Connection and Isolation Panel [NHANES]    Frequency of Communication with Friends and Family: More  than three times a week    Frequency of Social Gatherings with Friends and Family: Never    Attends Religious Services: Never    Printmaker: No    Attends Music therapist: Never    Marital Status: Divorced    Tobacco Counseling Ready to quit: Not Answered Counseling given: Not Answered   Clinical Intake:  Pre-visit preparation completed: Yes  Pain : 0-10 Pain Score: 5  Pain Type: Chronic pain Pain Location: Hip Pain Orientation: Right Pain Radiating Towards: LEG     Nutritional Risks: None Diabetes: Yes CBG  done?: No Did pt. bring in CBG monitor from home?: No  How often do you need to have someone help you when you read instructions, pamphlets, or other written materials from your doctor or pharmacy?: 1 - Never  Diabetic?YES Nutrition Risk Assessment:  Has the patient had any N/V/D within the last 2 months?  No  Does the patient have any non-healing wounds?  No  Has the patient had any unintentional weight loss or weight gain?  No   Diabetes:  Is the patient diabetic?  Yes  If diabetic, was a CBG obtained today?  No  Did the patient bring in their glucometer from home?  No  How often do you monitor your CBG's? TWICE/DAY.   Financial Strains and Diabetes Management:  Are you having any financial strains with the device, your supplies or your medication? No .  Does the patient want to be seen by Chronic Care Management for management of their diabetes?  No  Would the patient like to be referred to a Nutritionist or for Diabetic Management?  No   Diabetic Exams:  Diabetic Eye Exam: Completed 07/29/21.  Pt has been advised about the importance in completing this exam. A referral has been placed today. Message sent to referral coordinator for scheduling purposes. Advised pt to expect a call from office referred to regarding appt.  Diabetic Foot Exam: Completed 02/15/22. Pt has been advised about the importance in completing  this exam.   Interpreter Needed?: No  Information entered by :: Kirke Shaggy, LPN   Activities of Daily Living    03/25/2022   10:38 AM  In your present state of health, do you have any difficulty performing the following activities:  Hearing? 0  Vision? 0  Difficulty concentrating or making decisions? 0  Walking or climbing stairs? 1  Dressing or bathing? 0  Doing errands, shopping? 0  Preparing Food and eating ? N  Using the Toilet? N  In the past six months, have you accidently leaked urine? N  Do you have problems with loss of bowel control? N  Managing your Medications? N  Managing your Finances? N  Housekeeping or managing your Housekeeping? Y    Patient Care Team: Olin Hauser, DO as PCP - General (Family Medicine)  Indicate any recent Medical Services you may have received from other than [facility] providers in the past year (date may be approximate).     Assessment:   This is a routine wellness examination for Eno.  Hearing/Vision screen Hearing Screening - Comments:: NO AIDS Vision Screening - Comments:: WEARS GLASSES-   Dietary issues and exercise activities discussed: Current Exercise Habits: The patient does not participate in regular exercise at present   Goals Addressed             This Visit's Progress    DIET - INCREASE WATER INTAKE         Depression Screen    03/25/2022   10:34 AM 02/16/2022   10:58 AM 11/30/2021    9:30 AM 11/15/2021    9:45 AM 11/04/2021    8:59 AM 07/21/2021    8:20 AM 03/19/2021   11:56 AM  PHQ 2/9 Scores  PHQ - 2 Score 0 0 0 0 0 0 2  PHQ- 9 Score 0      8    Fall Risk    03/25/2022   10:37 AM 02/16/2022   10:58 AM 12/23/2021    9:05 AM 11/30/2021    9:29 AM 11/15/2021  9:45 AM  Fall Risk   Falls in the past year? 0 0 0 0 0  Number falls in past yr: 1      Injury with Fall? 0      Risk for fall due to : History of fall(s)      Follow up Falls prevention discussed;Falls evaluation completed         FALL RISK PREVENTION PERTAINING TO THE HOME:  Any stairs in or around the home? Yes  If so, are there any without handrails? No  Home free of loose throw rugs in walkways, pet beds, electrical cords, etc? Yes  Adequate lighting in your home to reduce risk of falls? Yes   ASSISTIVE DEVICES UTILIZED TO PREVENT FALLS:  Life alert? No  Use of a cane, walker or w/c? Yes W/C Grab bars in the bathroom? Yes  Shower chair or bench in shower? Yes  Elevated toilet seat or a handicapped toilet? Yes   Cognitive Function:        03/25/2022   10:43 AM  6CIT Screen  What Year? 0 points  What month? 0 points  What time? 0 points  Count back from 20 0 points  Months in reverse 4 points  Repeat phrase 2 points  Total Score 6 points    Immunizations Immunization History  Administered Date(s) Administered   Influenza,inj,Quad PF,6+ Mos 11/09/2020, 11/12/2021   Influenza,inj,quad, With Preservative 10/19/2016   Moderna Covid-19 Vaccine Bivalent Booster 22yr & up 03/27/2020   PFIZER Comirnaty(Gray Top)Covid-19 Tri-Sucrose Vaccine 06/06/2019   PFIZER(Purple Top)SARS-COV-2 Vaccination 05/16/2019   Zoster Recombinat (Shingrix) 11/12/2021    TDAP status: Due, Education has been provided regarding the importance of this vaccine. Advised may receive this vaccine at local pharmacy or Health Dept. Aware to provide a copy of the vaccination record if obtained from local pharmacy or Health Dept. Verbalized acceptance and understanding.  Flu Vaccine status: Up to date  Pneumococcal vaccine status: Up to date  Covid-19 vaccine status: Completed vaccines  Qualifies for Shingles Vaccine? Yes   Zostavax completed No   Shingrix Completed?: No.    Education has been provided regarding the importance of this vaccine. Patient has been advised to call insurance company to determine out of pocket expense if they have not yet received this vaccine. Advised may also receive vaccine at local pharmacy or  Health Dept. Verbalized acceptance and understanding.  Screening Tests Health Maintenance  Topic Date Due   HIV Screening  Never done   Diabetic kidney evaluation - Urine ACR  Never done   Hepatitis C Screening  Never done   DTaP/Tdap/Td (1 - Tdap) Never done   COVID-19 Vaccine (4 - 2023-24 season) 09/17/2021   Zoster Vaccines- Shingrix (2 of 2) 01/07/2022   Diabetic kidney evaluation - eGFR measurement  06/26/2022   OPHTHALMOLOGY EXAM  07/30/2022   HEMOGLOBIN A1C  08/16/2022   FOOT EXAM  02/16/2023   Medicare Annual Wellness (AWV)  03/25/2023   COLONOSCOPY (Pts [id number] coverage will need to be confirmed)  04/23/2024   INFLUENZA VACCINE  Completed   HPV VACCINES  Aged Out    Health Maintenance  Health Maintenance Due  Topic Date Due   HIV Screening  Never done   Diabetic kidney evaluation - Urine ACR  Never done   Hepatitis C Screening  Never done   DTaP/Tdap/Td (1 - Tdap) Never done   COVID-19 Vaccine (4 - 2023-24 season) 09/17/2021   Zoster Vaccines- Shingrix (2 of 2) 01/07/2022  Colorectal cancer screening: Type of screening: Colonoscopy. Completed 04/23/21. Repeat every 3 years  Lung Cancer Screening: (Low Dose CT Chest recommended if Age 81-80 years, 30 pack-year currently smoking OR have quit w/in 15years.) does not qualify.   Additional Screening:  Hepatitis C Screening: does qualify; Completed NO  Vision Screening: Recommended annual ophthalmology exams for early detection of glaucoma and other disorders of the eye. Is the patient up to date with their annual eye exam?  Yes  Who is the provider or what is the name of the office in which the patient attends annual eye exams? DOESN'T REMEMBER If pt is not established with a provider, would they like to be referred to a provider to establish care? No .   Dental Screening: Recommended annual dental exams for proper oral hygiene  Community Resource Referral / Chronic Care Management: CRR required this  visit?  No   CCM required this visit?  No      Plan:     I have personally reviewed and noted the following in the patient's chart:   Medical and social history Use of alcohol, tobacco or illicit drugs  Current medications and supplements including opioid prescriptions. Patient is currently taking opioid prescriptions. Information provided to patient regarding non-opioid alternatives. Patient advised to discuss non-opioid treatment plan with their provider. Functional ability and status Nutritional status Physical activity Advanced directives List of other physicians Hospitalizations, surgeries, and ER visits in previous 12 months Vitals Screenings to include cognitive, depression, and falls Referrals and appointments  In addition, I have reviewed and discussed with patient certain preventive protocols, quality metrics, and best practice recommendations. A written personalized care plan for preventive services as well as general preventive health recommendations were provided to patient.     Dionisio David, LPN   [phone]   Nurse Notes: Marlynn Perking

## 2022-03-27 ENCOUNTER — Other Ambulatory Visit: Payer: Self-pay | Admitting: Family Medicine

## 2022-03-27 DIAGNOSIS — E1169 Type 2 diabetes mellitus with other specified complication: Secondary | ICD-10-CM

## 2022-03-28 NOTE — Telephone Encounter (Signed)
Requested Prescriptions  Pending Prescriptions Disp Refills   metFORMIN (GLUCOPHAGE) 1000 MG tablet [Pharmacy Med Name: metFORMIN HCl 1000 MG Oral Tablet] 90 tablet 1    Sig: TAKE 1 TABLET BY MOUTH WITH BREAKFAST     Endocrinology:  Diabetes - Biguanides Failed - 03/27/2022  7:54 AM      Failed - Cr in normal range and within 360 days    Creat  Date Value Ref Range Status  06/25/2021 0.67 (L) 0.70 - 1.30 mg/dL Final         Failed - B12 Level in normal range and within 720 days    Vitamin B-12  Date Value Ref Range Status  02/15/2021 217 (L) 232 - 1,245 pg/mL Final         Passed - HBA1C is between 0 and 7.9 and within 180 days    Hemoglobin A1C  Date Value Ref Range Status  02/15/2022 6.0 (A) 4.0 - 5.6 % Final   Hgb A1c MFr Bld  Date Value Ref Range Status  06/25/2021 6.0 (H) <5.7 % of total Hgb Final    Comment:    For someone without known diabetes, a hemoglobin  A1c value between 5.7% and 6.4% is consistent with prediabetes and should be confirmed with a  follow-up test. . For someone with known diabetes, a value <7% indicates that their diabetes is well controlled. A1c targets should be individualized based on duration of diabetes, age, comorbid conditions, and other considerations. . This assay result is consistent with an increased risk of diabetes. . Currently, no consensus exists regarding use of hemoglobin A1c for diagnosis of diabetes for children. .          Passed - eGFR in normal range and within 360 days    GFR, Estimated  Date Value Ref Range Status  10/30/2020 >60 >60 mL/min Final    Comment:    (NOTE) Calculated using the CKD-EPI Creatinine Equation (2021)    eGFR  Date Value Ref Range Status  06/25/2021 108 > OR = 60 mL/min/1.59m Final    Comment:    The eGFR is based on the CKD-EPI 2021 equation. To calculate  the new eGFR from a previous Creatinine or Cystatin C result, go to  https://www.kidney.org/professionals/ kdoqi/gfr%5Fcalculator   02/15/2021 107 >59 mL/min/1.73 Final         Passed - Valid encounter within last 6 months    Recent Outpatient Visits           1 month ago Type 2 diabetes mellitus with other specified complication, with long-term current use of insulin ([facility]   [place] DO   4 months ago Type 2 diabetes mellitus with other specified complication, with long-term current use of insulin ([facility]   [place] DO   9 months ago Annual physical exam   [place] DO   1 year ago Pain and swelling of toe of left foot   [place] DO   1 year ago DDD (degenerative disc disease), lumbar   [place] DO       Future Appointments             In 3 months KParks Ranger [facility]  Passed - CBC within normal limits and completed in the last 12 months    WBC  Date Value Ref Range Status  06/25/2021 11.5 (H) 3.8 - 10.8 Thousand/uL Final   RBC  Date Value Ref Range Status  06/25/2021 5.36 4.20 - 5.80 Million/uL Final   Hemoglobin  Date Value Ref Range Status  06/25/2021 15.4 13.2 - 17.1 g/dL Final   HCT  Date Value Ref Range Status  06/25/2021 47.0 38.5 - 50.0 % Final   MCHC  Date Value Ref Range Status  06/25/2021 32.8 32.0 - 36.0 g/dL Final   [facility]  Date Value Ref Range Status  06/25/2021 28.7 27.0 - 33.0 pg Final   MCV  Date Value Ref Range Status  06/25/2021 87.7 80.0 - 100.0 fL Final   No results found for: "PLTCOUNTKUC", "LABPLAT", "POCPLA" RDW  Date Value Ref Range Status  06/25/2021 13.8 11.0 - 15.0 % Final

## 2022-03-31 DIAGNOSIS — M5136 Other intervertebral disc degeneration, lumbar region: Secondary | ICD-10-CM | POA: Diagnosis not present

## 2022-03-31 DIAGNOSIS — R296 Repeated falls: Secondary | ICD-10-CM | POA: Diagnosis not present

## 2022-03-31 DIAGNOSIS — G894 Chronic pain syndrome: Secondary | ICD-10-CM | POA: Diagnosis not present

## 2022-03-31 DIAGNOSIS — M150 Primary generalized (osteo)arthritis: Secondary | ICD-10-CM | POA: Diagnosis not present

## 2022-04-15 DIAGNOSIS — G894 Chronic pain syndrome: Secondary | ICD-10-CM | POA: Diagnosis not present

## 2022-04-15 DIAGNOSIS — R296 Repeated falls: Secondary | ICD-10-CM | POA: Diagnosis not present

## 2022-04-15 DIAGNOSIS — M150 Primary generalized (osteo)arthritis: Secondary | ICD-10-CM | POA: Diagnosis not present

## 2022-04-15 DIAGNOSIS — M5136 Other intervertebral disc degeneration, lumbar region: Secondary | ICD-10-CM | POA: Diagnosis not present

## 2022-04-16 ENCOUNTER — Other Ambulatory Visit: Payer: Self-pay | Admitting: Family Medicine

## 2022-04-16 DIAGNOSIS — J9801 Acute bronchospasm: Secondary | ICD-10-CM

## 2022-04-18 NOTE — Telephone Encounter (Signed)
Requested Prescriptions  Pending Prescriptions Disp Refills   albuterol (VENTOLIN HFA) 108 (90 Base) MCG/ACT inhaler [Pharmacy Med Name: Albuterol Sulfate HFA 108 (90 Base) MCG/ACT Inhalation Aerosol Solution] 8 g 2    Sig: INHALE 1 TO 2 PUFFS BY MOUTH EVERY 6 HOURS AS NEEDED FOR WHEEZING FOR SHORTNESS OF BREATH     Pulmonology:  Beta Agonists 2 Failed - 04/16/2022  6:52 AM      Failed - Last BP in normal range    BP Readings from Last 1 Encounters:  03/01/22 (!) 121/103         Passed - Last Heart Rate in normal range    Pulse Readings from Last 1 Encounters:  03/01/22 73         Passed - Valid encounter within last 12 months    Recent Outpatient Visits           2 months ago Type 2 diabetes mellitus with other specified complication, with long-term current use of insulin ([employer])   [place], Devonne Doughty, DO   5 months ago Type 2 diabetes mellitus with other specified complication, with long-term current use of insulin [facility])   [place], DO   9 months ago Annual physical exam   [place], DO   1 year ago Pain and swelling of toe of left foot   [place], DO   1 year ago DDD (degenerative disc disease), lumbar   [place], DO       Future Appointments             In 3 months [place] Ranger, Devonne Doughty, DO [facility], [facility]

## 2022-04-19 ENCOUNTER — Other Ambulatory Visit: Payer: Self-pay | Admitting: Family Medicine

## 2022-04-19 DIAGNOSIS — I10 Essential (primary) hypertension: Secondary | ICD-10-CM

## 2022-04-19 DIAGNOSIS — E1142 Type 2 diabetes mellitus with diabetic polyneuropathy: Secondary | ICD-10-CM | POA: Diagnosis not present

## 2022-04-19 DIAGNOSIS — M170 Bilateral primary osteoarthritis of knee: Secondary | ICD-10-CM | POA: Diagnosis not present

## 2022-04-19 DIAGNOSIS — M47816 Spondylosis without myelopathy or radiculopathy, lumbar region: Secondary | ICD-10-CM | POA: Diagnosis not present

## 2022-04-19 NOTE — Telephone Encounter (Signed)
Rx 06/25/21 #90 3RF- too soon Requested Prescriptions  Pending Prescriptions Disp Refills   hydrochlorothiazide (HYDRODIURIL) 25 MG tablet [Pharmacy Med Name: hydroCHLOROthiazide 25 MG Oral Tablet] 90 tablet 0    Sig: Take 1 tablet by mouth once daily     Cardiovascular: Diuretics - Thiazide Failed - 04/19/2022  6:52 AM      Failed - Cr in normal range and within 180 days    Creat  Date Value Ref Range Status  06/25/2021 0.67 (L) 0.70 - 1.30 mg/dL Final         Failed - K in normal range and within 180 days    Potassium  Date Value Ref Range Status  06/25/2021 4.1 3.5 - 5.3 mmol/L Final         Failed - Na in normal range and within 180 days    Sodium  Date Value Ref Range Status  06/25/2021 142 135 - 146 mmol/L Final  02/15/2021 137 134 - 144 mmol/L Final         Failed - Last BP in normal range    BP Readings from Last 1 Encounters:  03/01/22 (!) 121/103         Passed - Valid encounter within last 6 months    Recent Outpatient Visits           2 months ago Type 2 diabetes mellitus with other specified complication, with long-term current use of insulin [facility])   [place], Devonne Doughty, DO   5 months ago Type 2 diabetes mellitus with other specified complication, with long-term current use of insulin [facility])   [facility] Olin Hauser, DO   9 months ago Annual physical exam   [place], DO   1 year ago Pain and swelling of toe of left foot   [place], Alexander J, DO   1 year ago DDD (degenerative disc disease), lumbar   [place], DO       Future Appointments             In 3 months [place] Ranger, Devonne Doughty, DO [facility], [facility]

## 2022-04-24 DIAGNOSIS — J4530 Mild persistent asthma, uncomplicated: Secondary | ICD-10-CM | POA: Diagnosis not present

## 2022-04-29 ENCOUNTER — Telehealth: Payer: Self-pay | Admitting: Family Medicine

## 2022-04-29 NOTE — Telephone Encounter (Signed)
Pt is calling in because he says he notices at night that his breathing cuts off and wants to know what Dr. Althea [name] thinks it could be. Pt would like someone to follow up with him. Please advise.

## 2022-04-29 NOTE — Telephone Encounter (Signed)
Pt needs an appointment for this.  He is scheduled for 04/17 at 2:20.    Thanks,   -Vernona Rieger

## 2022-05-04 ENCOUNTER — Ambulatory Visit (INDEPENDENT_AMBULATORY_CARE_PROVIDER_SITE_OTHER): Payer: 59 | Admitting: Family Medicine

## 2022-05-04 ENCOUNTER — Encounter: Payer: Self-pay | Admitting: Family Medicine

## 2022-05-04 VITALS — BP 108/69 | HR 69 | Ht 74.0 in | Wt 329.0 lb

## 2022-05-04 DIAGNOSIS — J4531 Mild persistent asthma with (acute) exacerbation: Secondary | ICD-10-CM

## 2022-05-04 DIAGNOSIS — J9801 Acute bronchospasm: Secondary | ICD-10-CM

## 2022-05-04 MED ORDER — PREDNISONE 20 MG PO TABS
ORAL_TABLET | ORAL | 0 refills | Status: DC
Start: 2022-05-04 — End: 2022-05-25

## 2022-05-04 MED ORDER — LEVOFLOXACIN 500 MG PO TABS
500.0000 mg | ORAL_TABLET | Freq: Every day | ORAL | 0 refills | Status: DC
Start: 2022-05-04 — End: 2022-05-25

## 2022-05-04 MED ORDER — ALBUTEROL SULFATE HFA 108 (90 BASE) MCG/ACT IN AERS: INHALATION_SPRAY | RESPIRATORY_TRACT | 2 refills | Status: DC

## 2022-05-04 MED ORDER — FLUTICASONE-SALMETEROL 250-50 MCG/ACT IN AEPB
1.0000 | INHALATION_SPRAY | Freq: Two times a day (BID) | RESPIRATORY_TRACT | 2 refills | Status: AC
Start: 2022-05-04 — End: ?

## 2022-05-04 MED ORDER — GUAIFENESIN-CODEINE 100-10 MG/5ML PO SYRP
5.0000 mL | ORAL_SOLUTION | Freq: Four times a day (QID) | ORAL | 0 refills | Status: DC | PRN
Start: 2022-05-04 — End: 2022-06-21

## 2022-05-04 NOTE — Progress Notes (Signed)
Subjective:    Patient ID: James [name], male    DOB: 02-21-1960, 62 y.o.   MRN: [id number]  James [name] is a 62 y.o. male presenting on 05/04/2022 for Cough (Has had to use nebulizer.  Feel congested making him short of breath and trouble sleeping)   HPI  Asthma Exacerbation / Cough Congestion Recent acute onset worse 1 week with productive cough wheezing and dyspnea shortness of breath He has asthma, has Flovent, and Albuterol, out of rescue inhaler needs new orders. Has nebulizer PRN No recent antibiotic or treatment course for this.      05/04/2022    2:34 PM 03/25/2022   10:34 AM 02/16/2022   10:58 AM  Depression screen PHQ 2/9  Decreased Interest 0 0 0  Down, Depressed, Hopeless 0 0 0  PHQ - 2 Score 0 0 0  Altered sleeping 1 0   Tired, decreased energy 0 0   Change in appetite 0 0   Feeling bad or failure about yourself  0 0   Trouble concentrating 0 0   Moving slowly or fidgety/restless 0 0   Suicidal thoughts 0 0   PHQ-9 Score 1 0   Difficult doing work/chores  Not difficult at all     Social History   Tobacco Use   Smoking status: Some Days    Packs/day: 0.10    Years: 25.00    Additional pack years: 0.00    Total pack years: 2.50    Types: Cigarettes   Smokeless tobacco: Never  Vaping Use   Vaping Use: Never used  Substance Use Topics   Alcohol use: Never   Drug use: Never    Review of Systems Per HPI unless specifically indicated above     Objective:    BP 108/69   Pulse 69   Ht  (1.88 m)   Wt (!) 329 lb (149.2 kg)   SpO2 94%   BMI 42.24 kg/m   Wt Readings from Last 3 Encounters:  05/04/22 (!) 329 lb (149.2 kg)  03/25/22 (!) 315 lb (142.9 kg)  03/01/22 (!) 315 lb (142.9 kg)    Physical Exam Vitals and nursing note reviewed.  Constitutional:      General: He is not in acute distress.    Appearance: He is well-developed. He is obese. He is not diaphoretic.     Comments: Well-appearing, comfortable, cooperative  HENT:      Head: Normocephalic and atraumatic.  Eyes:     General:        Right eye: No discharge.        Left eye: No discharge.     Conjunctiva/sclera: Conjunctivae normal.  Neck:     Thyroid: No thyromegaly.  Cardiovascular:     Rate and Rhythm: Normal rate and regular rhythm.     Pulses: Normal pulses.     Heart sounds: Normal heart sounds. No murmur heard. Pulmonary:     Effort: Pulmonary effort is normal. No respiratory distress.     Breath sounds: Wheezing present. No rales.     Comments: coughing Musculoskeletal:        General: Normal range of motion.     Cervical back: Normal range of motion and neck supple.     Comments: motorized wheelchair  Lymphadenopathy:     Cervical: No cervical adenopathy.  Skin:    General: Skin is warm and dry.     Findings: No erythema or rash.  Neurological:     Mental Status: He  is alert and oriented to person, place, and time. Mental status is at baseline.  Psychiatric:        Behavior: Behavior normal.     Comments: Well groomed, good eye contact, normal speech and thoughts    Results for orders placed or performed in visit on 02/16/22  ToxASSURE Select 13 (MW), Urine  Result Value Ref Range   Summary Note       Assessment & Plan:   Problem List Items Addressed This Visit     Mild persistent asthma without complication - Primary   Relevant Medications   albuterol (VENTOLIN HFA) 108 (90 Base) MCG/ACT inhaler   fluticasone-salmeterol (ADVAIR DISKUS) 250-50 MCG/ACT AEPB   predniSONE (DELTASONE) 20 MG tablet   levofloxacin (LEVAQUIN) 500 MG tablet   guaiFENesin-codeine (ROBITUSSIN AC) 100-10 MG/5ML syrup   Other Visit Diagnoses     Bronchospasm       Relevant Medications   albuterol (VENTOLIN HFA) 108 (90 Base) MCG/ACT inhaler   fluticasone-salmeterol (ADVAIR DISKUS) 250-50 MCG/ACT AEPB   predniSONE (DELTASONE) 20 MG tablet   guaiFENesin-codeine (ROBITUSSIN AC) 100-10 MG/5ML syrup       Acute Asthma Exacerbation Pulse ox  94%  Start Prednisone taper Start taking Levaquin antibiotic  daily x 7 days Stop Flovent Start Advair inhaler twice a day Codeine cough syrup as needed Albuterol rescue inhaler as needed  Consider CXR if not improved, return criteria reviewed.  Meds ordered this encounter  Medications   albuterol (VENTOLIN HFA) 108 (90 Base) MCG/ACT inhaler    Sig: INHALE 1 TO 2 PUFFS BY MOUTH EVERY 6 HOURS AS NEEDED FOR WHEEZING FOR SHORTNESS OF BREATH    Dispense:  8 g    Refill:  2   fluticasone-salmeterol (ADVAIR DISKUS) 250-50 MCG/ACT AEPB    Sig: Inhale 1 puff into the lungs in the morning and at bedtime.    Dispense:  60 each    Refill:  2   predniSONE (DELTASONE) 20 MG tablet    Sig: Take daily with food. Start with  (3 pills) x 2 days, then reduce to  (2 pills) x 2 days, then  (1 pill) x 3 days    Dispense:  13 tablet    Refill:  0   levofloxacin (LEVAQUIN) 500 MG tablet    Sig: Take 1 tablet (500 mg total) by mouth daily. For 7 days    Dispense:  7 tablet    Refill:  0   guaiFENesin-codeine (ROBITUSSIN AC) 100-10 MG/5ML syrup    Sig: Take 5-10 mLs by mouth 4 (four) times daily as needed for cough.    Dispense:  180 mL    Refill:  0      Follow up plan: Return if symptoms worsen or fail to improve.   Saralyn Pilar, DO [facility] 05/04/2022, 2:57 PM

## 2022-05-04 NOTE — Patient Instructions (Addendum)
Thank you for coming to the office today.  Start Prednisone taper Start taking Levaquin antibiotic  daily x 7 days Stop Flovent Start Advair inhaler twice a day Codeinen cough syrup as needed Albuterol rescue inhaler as needed   Please schedule a Follow-up Appointment to: Return if symptoms worsen or fail to improve.  If you have any other questions or concerns, please feel free to call the office or send a message through MyChart. You may also schedule an earlier appointment if necessary.  Additionally, you may be receiving a survey about your experience at our office within a few days to 1 week by e-mail or mail. We value your feedback.  Saralyn Pilar, DO [facility], [place]

## 2022-05-16 DIAGNOSIS — M5136 Other intervertebral disc degeneration, lumbar region: Secondary | ICD-10-CM | POA: Diagnosis not present

## 2022-05-16 DIAGNOSIS — M150 Primary generalized (osteo)arthritis: Secondary | ICD-10-CM | POA: Diagnosis not present

## 2022-05-16 DIAGNOSIS — G894 Chronic pain syndrome: Secondary | ICD-10-CM | POA: Diagnosis not present

## 2022-05-16 DIAGNOSIS — R296 Repeated falls: Secondary | ICD-10-CM | POA: Diagnosis not present

## 2022-05-18 ENCOUNTER — Telehealth: Payer: Self-pay | Admitting: Pain Medicine

## 2022-05-18 NOTE — Telephone Encounter (Addendum)
Patient is having severe pain in lower back and down right leg. Says he fell last week but did not have any pain at that time. He would like someone to call him  He has an appt on 05-25-22

## 2022-05-18 NOTE — Telephone Encounter (Signed)
Spoke with patient.  He states that he fell last Thursday but did not seek treatment.  States he is hurting in his  low back and right leg.  States it is hard to sit down and get up.   When questioned, he states that this is his same pain and does not think it has gooten any worse from the fall.  States he would like an Epidural.  He has an appointment on 05-25-2022 but is hurting so bad that he dont know if he can wait that long.  Please advise. Thanks

## 2022-05-19 ENCOUNTER — Telehealth: Payer: Self-pay | Admitting: Family Medicine

## 2022-05-19 DIAGNOSIS — M47816 Spondylosis without myelopathy or radiculopathy, lumbar region: Secondary | ICD-10-CM | POA: Diagnosis not present

## 2022-05-19 DIAGNOSIS — E1142 Type 2 diabetes mellitus with diabetic polyneuropathy: Secondary | ICD-10-CM | POA: Diagnosis not present

## 2022-05-19 DIAGNOSIS — M170 Bilateral primary osteoarthritis of knee: Secondary | ICD-10-CM | POA: Diagnosis not present

## 2022-05-19 MED ORDER — BLOOD PRESSURE MONITORING DEVI: 1.0000 | 0 refills | Status: DC

## 2022-05-19 NOTE — Telephone Encounter (Signed)
Blood pressure device rx sent.

## 2022-05-19 NOTE — Telephone Encounter (Signed)
Renee with [facility] called asking if Dr. Kirtland Bouchard would send the a prescription for a blood pressure cuff so the pt can check his BP at home/  CB#  [phone]

## 2022-05-19 NOTE — Telephone Encounter (Signed)
Attempted to call patient with message from Dr Laban Emperor. LM to call office.

## 2022-05-20 ENCOUNTER — Other Ambulatory Visit: Payer: Self-pay | Admitting: Family Medicine

## 2022-05-20 DIAGNOSIS — E1169 Type 2 diabetes mellitus with other specified complication: Secondary | ICD-10-CM

## 2022-05-20 NOTE — Telephone Encounter (Signed)
Requested Prescriptions  Pending Prescriptions Disp Refills   OZEMPIC, 1 MG/DOSE, 4 MG/3ML SOPN [Pharmacy Med Name: Ozempic (1 MG/DOSE) 4 MG/3ML Subcutaneous Solution Pen-injector] 9 mL 0    Sig: INJECT 1MG  INTO THE SKIN ONCE A WEEK     Endocrinology:  Diabetes - GLP-1 Receptor Agonists - semaglutide Failed - 05/20/2022  6:52 AM      Failed - HBA1C in normal range and within 180 days    Hemoglobin A1C  Date Value Ref Range Status  02/15/2022 6.0 (A) 4.0 - 5.6 % Final   Hgb A1c MFr Bld  Date Value Ref Range Status  06/25/2021 6.0 (H) <5.7 % of total Hgb Final    Comment:    For someone without known diabetes, a hemoglobin  A1c value between 5.7% and 6.4% is consistent with prediabetes and should be confirmed with a  follow-up test. . For someone with known diabetes, a value <7% indicates that their diabetes is well controlled. A1c targets should be individualized based on duration of diabetes, age, comorbid conditions, and other considerations. . This assay result is consistent with an increased risk of diabetes. . Currently, no consensus exists regarding use of hemoglobin A1c for diagnosis of diabetes for children. .          Failed - Cr in normal range and within 360 days    Creat  Date Value Ref Range Status  06/25/2021 0.67 (L) 0.70 - 1.30 mg/dL Final         Passed - Valid encounter within last 6 months    Recent Outpatient Visits           2 weeks ago Mild persistent asthma with acute exacerbation   [place] [facility] [place], Netta Neat, DO   3 months ago Type 2 diabetes mellitus with other specified complication, with long-term current use of insulin [facility])   [place] [facility] [place], Netta Neat, DO   6 months ago Type 2 diabetes mellitus with other specified complication, with long-term current use of insulin [facility])   [facility] Smitty Cords, DO   10 months ago Annual  physical exam   [place] [facility] Smitty Cords, DO   1 year ago Pain and swelling of toe of left foot   [place] [facility] Althea Charon, Netta Neat, DO       Future Appointments             In 2 months Althea Charon, Netta Neat, DO  [facility], [facility]

## 2022-05-24 DIAGNOSIS — J4530 Mild persistent asthma, uncomplicated: Secondary | ICD-10-CM | POA: Diagnosis not present

## 2022-05-24 NOTE — Progress Notes (Unsigned)
PROVIDER NOTE: Information contained herein reflects review and annotations entered in association with encounter. Interpretation of such information and data should be left to medically-trained personnel. Information provided to patient can be located elsewhere in the medical record under "Patient Instructions". Document created using STT-dictation technology, any transcriptional errors that may result from process are unintentional.    Patient: James [name]  Service Category: E/M  Provider: Oswaldo Done, MD  DOB: 06-Jan-1961  DOS: 05/25/2022  Referring Provider: Saralyn Pilar *  MRN: [id number]  Specialty: Interventional Pain Management  PCP: Smitty Cords, DO  Type: Established Patient  Setting: Ambulatory outpatient    Location: Office  Delivery: Face-to-face     HPI  James [name], a 62 y.o. year old male, is here today because of his No primary diagnosis found.. James [name] primary complain today is No chief complaint on file.  Pertinent problems: James [name] has Chronic pain syndrome; DDD (degenerative disc disease), lumbar; Primary osteoarthritis involving multiple joints; Weakness of lower extremities (Bilateral); Chronic lower extremity pain (1ry area of Pain) (Bilateral); Chronic low back pain (4th area of Pain) (Bilateral) w/ sciatica (Bilateral); Chronic shoulder pain (5th area of Pain) (Left); Chronic hip pain (2ry area of Pain) (Bilateral); Chronic knee pain (3ry area of Pain) (Bilateral); Osteoarthritis of hips (Bilateral); Osteoarthritis of hip (Left); Osteoarthritis of hip (Right); Osteoarthritis of knee (Left); Lumbosacral radiculopathy at L5 (Left); Diabetic neuropathy (HCC); Osteoarthritis of AC (acromioclavicular) joint (Left); Osteoarthritis of shoulder (Left); Rotator cuff arthropathy of shoulder (Left); Lumbosacral facet hypertrophy (L4-5, L5-S1); Lumbar facet syndrome; Abnormal MRI, hip (04/21/2021); Osteoarthritis of knees (Bilateral)  (L>R); Abnormal EMG (electromyogram) (03/16/2021); Polyneuropathy, peripheral sensorimotor axonal; Diabetic sensorimotor polyneuropathy (HCC); Abnormal MRI, lumbar spine (05/21/2021); Chronic arthropathy; 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 Pain Assessment: Severity of   is reported as a  /10. Location:    / . Onset:  . Quality:  . Timing:  . Modifying factor(s):  Marland Kitchen Vitals:  vitals were not taken for this visit.  BMI: Estimated body mass index is 42.24 kg/m as calculated from the following:   Height as of 05/04/22: 6\' 2"  (1.88 m).   Weight as of 05/04/22: 329 lb (149.2 kg). Last encounter: 03/24/2022. Last procedure: 03/01/2022.  Reason for encounter: medication management. ***  RTCB: 08/29/2022   Pharmacotherapy Assessment  Analgesic: Oxycodone/APAP 10/325 (# 60/month), 1 tab p.o. twice daily (last filled on 02/03/2021) Needs to loose an average of 2.75 lbs/mo to be considered for opioid pharmacotherapy.  MME/day: 30 mg/day   Monitoring: [place] PMP: PDMP reviewed during this encounter.       Pharmacotherapy: No side-effects or adverse reactions reported. Compliance: No problems identified. Effectiveness: Clinically acceptable.  No notes on file  No results found for: "CBDTHCR" No results found for: "D8THCCBX" No results found for: "D9THCCBX"  UDS:  Summary  Date Value Ref Range Status  02/16/2022 Note  Final    Comment:    ==================================================================== [employer]  (MW) ==================================================================== Test                             Result       Flag  Units  Drug Present and Declared for Prescription Verification   Oxycodone                      2223         EXPECTED   ng/mg creat   Oxymorphone                    1208         EXPECTED   ng/mg creat   Noroxycodone                   3040         EXPECTED   ng/mg creat   Noroxymorphone                 475          EXPECTED   ng/mg creat    Sources of oxycodone are scheduled prescription medications.    Oxymorphone, noroxycodone, and noroxymorphone are expected    metabolites of oxycodone. Oxymorphone is also available as a    scheduled prescription medication.  ==================================================================== Test                      Result    Flag   Units      Ref Range   Creatinine              93               mg/dL      >=82 ==================================================================== Declared Medications:  The flagging and interpretation on this report are based on the  following declared medications.  Unexpected results may arise from  inaccuracies in the declared medications.   **Note: The testing scope of this panel includes these medications:   Oxycodone   **Note: The testing scope of this panel does not include the  following reported medications:   Albuterol  Atorvastatin  Dapagliflozin (Farxiga)  Fluticasone (Flovent HFA)  Furosemide (Lasix)  Hydrochlorothiazide  Insulin Evaristo Bury)  Liraglutide (Victoza)  Losartan (Cozaar)  Metformin  Naloxone (Narcan)  Pantoprazole (Protonix)  Pregabalin (Lyrica)  Semaglutide (Ozempic)  Varenicline (Chantix) ==================================================================== For clinical consultation, please call [phone]. ====================================================================       ROS  Constitutional: Denies any fever or  chills Gastrointestinal: No reported hemesis, hematochezia, vomiting, or acute GI distress Musculoskeletal: Denies any acute onset joint swelling, redness, loss of ROM, or weakness Neurological: No reported episodes of acute onset apraxia, aphasia, dysarthria, agnosia, amnesia, paralysis, loss of coordination, or loss of consciousness  Medication Review  Blood Pressure Monitoring, Oxycodone HCl, Semaglutide (1 MG/DOSE), albuterol, atorvastatin, fluticasone-salmeterol, furosemide, guaiFENesin-codeine, hydrochlorothiazide, levofloxacin, losartan, metFORMIN, naloxone, pantoprazole, predniSONE, pregabalin, and varenicline  History Review  Allergy: James [name] has No Known Allergies. Drug: James [name]  reports no history of drug use. Alcohol:  reports no history of alcohol use. Tobacco:  reports that he has been smoking cigarettes. He has a 2.50 pack-year smoking history. He has never used smokeless tobacco. Social: James [name]  reports that he has been smoking cigarettes. He has a 2.50 pack-year smoking history. He has never used smokeless tobacco. He reports that he does not drink alcohol and does not use drugs. Medical:  has a past medical history of Arthritis, Diabetes mellitus without complication (HCC), Hyperlipidemia, and Hypertension. Surgical: James [name]  has a past surgical history that includes Colonoscopy with propofol; Appendectomy; and Colonoscopy with propofol (N/A, 04/23/2021). Family: family history includes Arthritis in his father  and mother; Heart disease in his mother.  Laboratory Chemistry Profile   Renal Lab Results  Component Value Date   BUN 12 06/25/2021   CREATININE 0.67 (L) 06/25/2021   BCR 18 06/25/2021   GFRNONAA >60 10/30/2020    Hepatic Lab Results  Component Value Date   AST 10 06/25/2021   ALT 8 (L) 06/25/2021   ALBUMIN 4.1 02/15/2021   ALKPHOS 116 02/15/2021    Electrolytes Lab Results  Component Value Date   NA 142 06/25/2021   K 4.1  06/25/2021   CL 100 06/25/2021   CALCIUM 9.0 06/25/2021   MG 1.8 02/15/2021    Bone Lab Results  Component Value Date   25OHVITD1 13 (L) 02/15/2021   25OHVITD2 <1.0 02/15/2021   25OHVITD3 12 02/15/2021    Inflammation (CRP: Acute Phase) (ESR: Chronic Phase) Lab Results  Component Value Date   CRP 29 (H) 02/15/2021   ESRSEDRATE 39 (H) 02/15/2021         Note: Above Lab results reviewed.  Recent Imaging Review  DG PAIN CLINIC C-ARM 1-60 MIN NO REPORT Fluoro was used, but no Radiologist interpretation will be provided.  Please refer to "NOTES" tab for provider progress note. Note: Reviewed        Physical Exam  General appearance: Well nourished, well developed, and well hydrated. In no apparent acute distress Mental status: Alert, oriented x 3 (person, place, & time)       Respiratory: No evidence of acute respiratory distress Eyes: PERLA Vitals: There were no vitals taken for this visit. BMI: Estimated body mass index is 42.24 kg/m as calculated from the following:   Height as of 05/04/22: 6\' 2"  (1.88 m).   Weight as of 05/04/22: 329 lb (149.2 kg). Ideal: Patient weight not recorded  Assessment   Diagnosis Status  No diagnosis found. Controlled Controlled Controlled   Updated Problems: No problems updated.  Plan of Care  Problem-specific:  No problem-specific Assessment & Plan notes found for this encounter.  Mr. Michaela Jourdan has a current medication list which includes the following long-term medication(s): albuterol, albuterol, atorvastatin, fluticasone-salmeterol, furosemide, hydrochlorothiazide, losartan, metformin, oxycodone hcl, oxycodone hcl, oxycodone hcl, pantoprazole, and pregabalin.  Pharmacotherapy (Medications Ordered): No orders of the defined types were placed in this encounter.  Orders:  No orders of the defined types were placed in this encounter.  Follow-up plan:   No follow-ups on file.      Interventional Therapies  Risk Factors   Considerations:   Goal: BMI down to 30 kg/m (Goal: 220 lbs for his height). Must lose 2.75 lbs/month. (03/01/2022 - 315 Lbs.)  Class 3 MO  SOBOE  T2DM  BA  HTN   Planned  Pending:   NO MORE PROCEDURES UNTIL BMI IS <35 (very hard to move onto bed)   Under consideration:   NO MORE PROCEDURES UNTIL BMI IS <35 (very hard to move onto bed)   Completed:   Therapeutic right L2-3 LESI x2 (03/01/2022) ( Diagnostic/therapeutic right L3-4 LESI x1 (11/04/21) (100/100/25/25) (100% of LBP)  Diagnostic/therapeutic right L4-5 LESI x2 (11/30/2021) (100/100/100/80)  Toradol/Norflex IM 60/60 mg (03/29/2021)  Referral to orthopedic surgery for evaluation of knees and hip (03/29/2021)  Referral to medical weight management (02/15/2021)  Referral to bariatric surgery (02/15/2021)    Completed by other providers:   [facility]  EMG/PNCV (LE) by Theora Master, MD [facility] [place] [facility]) (03/16/2021) (chronic, severe sensorimotor polyneuropathy)   Therapeutic  Palliative (PRN) options:   None established  Recent Visits Date Type Provider Dept  03/24/22 Office Visit Delano Metz, MD [facility]  03/17/22 Office Visit Delano Metz, MD [facility]  03/01/22 Procedure visit Delano Metz, MD [facility]  Showing recent visits within past 90 days and meeting all other requirements Future Appointments Date Type Provider Dept  05/25/22 Appointment Delano Metz, MD [facility]  Showing future appointments within next 90 days and meeting all other requirements  I discussed the assessment and treatment plan with the patient. The patient was provided an opportunity to ask questions and all were answered. The patient agreed with the plan and demonstrated an understanding of the instructions.  Patient advised to call back or seek an in-person evaluation if the symptoms or condition worsens.  Duration of  encounter: *** minutes.  Total time on encounter, as per AMA guidelines included both the face-to-face and non-face-to-face time personally spent by the physician and/or other qualified health care professional(s) on the day of the encounter (includes time in activities that require the physician or other qualified health care professional and does not include time in activities normally performed by clinical staff). Physician's time may include the following activities when performed: Preparing to see the patient (e.g., pre-charting review of records, searching for previously ordered imaging, lab work, and nerve conduction tests) Review of prior analgesic pharmacotherapies. Reviewing PMP Interpreting ordered tests (e.g., lab work, imaging, nerve conduction tests) Performing post-procedure evaluations, including interpretation of diagnostic procedures Obtaining and/or reviewing separately obtained history Performing a medically appropriate examination and/or evaluation Counseling and educating the patient/family/caregiver Ordering medications, tests, or procedures Referring and communicating with other health care professionals (when not separately reported) Documenting clinical information in the electronic or other health record Independently interpreting results (not separately reported) and communicating results to the patient/ family/caregiver Care coordination (not separately reported)  Note by: Oswaldo Done, MD Date: 05/25/2022; Time: 7:25 AM

## 2022-05-24 NOTE — Patient Instructions (Signed)
____________________________________________________________________________________________  Opioid Pain Medication Update  To: All patients taking opioid pain medications. (I.e.: hydrocodone, hydromorphone, oxycodone, oxymorphone, morphine, codeine, methadone, tapentadol, tramadol, buprenorphine, fentanyl, etc.)  Re: Updated review of side effects and adverse reactions of opioid analgesics, as well as new information about long term effects of this class of medications.  Direct risks of long-term opioid therapy are not limited to opioid addiction and overdose. Potential medical risks include serious fractures, breathing problems during sleep, hyperalgesia, immunosuppression, chronic constipation, bowel obstruction, myocardial infarction, and tooth decay secondary to xerostomia.  Unpredictable adverse effects that can occur even if you take your medication correctly: Cognitive impairment, respiratory depression, and death. Most people think that if they take their medication "correctly", and "as instructed", that they will be safe. Nothing could be farther from the truth. In reality, a significant amount of recorded deaths associated with the use of opioids has occurred in individuals that had taken the medication for a long time, and were taking their medication correctly. The following are examples of how this can happen: Patient taking his/her medication for a long time, as instructed, without any side effects, is given a certain antibiotic or another unrelated medication, which in turn triggers a "Drug-to-drug interaction" leading to disorientation, cognitive impairment, impaired reflexes, respiratory depression or an untoward event leading to serious bodily harm or injury, including death.  Patient taking his/her medication for a long time, as instructed, without any side effects, develops an acute impairment of liver and/or kidney function. This will lead to a rapid inability of the body to  breakdown and eliminate their pain medication, which will result in effects similar to an "overdose", but with the same medicine and dose that they had always taken. This again may lead to disorientation, cognitive impairment, impaired reflexes, respiratory depression or an untoward event leading to serious bodily harm or injury, including death.  A similar problem will occur with patients as they grow older and their liver and kidney function begins to decrease as part of the aging process.  Background information: Historically, the original case for using long-term opioid therapy to treat chronic noncancer pain was based on safety assumptions that subsequent experience has called into question. In 1996, the American Pain Society and the American Academy of Pain Medicine issued a consensus statement supporting long-term opioid therapy. This statement acknowledged the dangers of opioid prescribing but concluded that the risk for addiction was low; respiratory depression induced by opioids was short-lived, occurred mainly in opioid-naive patients, and was antagonized by pain; tolerance was not a common problem; and efforts to control diversion should not constrain opioid prescribing. This has now proven to be wrong. Experience regarding the risks for opioid addiction, misuse, and overdose in community practice has failed to support these assumptions.  According to the Centers for Disease Control and Prevention, fatal overdoses involving opioid analgesics have increased sharply over the past decade. Currently, more than 96,700 people die from drug overdoses every year. Opioids are a factor in 7 out of every 10 overdose deaths. Deaths from drug overdose have surpassed motor vehicle accidents as the leading cause of death for individuals between the ages of 35 and 54.  Clinical data suggest that neuroendocrine dysfunction may be very common in both men and women, potentially causing hypogonadism, erectile  dysfunction, infertility, decreased libido, osteoporosis, and depression. Recent studies linked higher opioid dose to increased opioid-related mortality. Controlled observational studies reported that long-term opioid therapy may be associated with increased risk for cardiovascular events. Subsequent meta-analysis concluded   that the safety of long-term opioid therapy in elderly patients has not been proven.   Side Effects and adverse reactions: Common side effects: Drowsiness (sedation). Dizziness. Nausea and vomiting. Constipation. Physical dependence -- Dependence often manifests with withdrawal symptoms when opioids are discontinued or decreased. Tolerance -- As you take repeated doses of opioids, you require increased medication to experience the same effect of pain relief. Respiratory depression -- This can occur in healthy people, especially with higher doses. However, people with COPD, asthma or other lung conditions may be even more susceptible to fatal respiratory impairment.  Uncommon side effects: An increased sensitivity to feeling pain and extreme response to pain (hyperalgesia). Chronic use of opioids can lead to this. Delayed gastric emptying (the process by which the contents of your stomach are moved into your small intestine). Muscle rigidity. Immune system and hormonal dysfunction. Quick, involuntary muscle jerks (myoclonus). Arrhythmia. Itchy skin (pruritus). Dry mouth (xerostomia).  Long-term side effects: Chronic constipation. Sleep-disordered breathing (SDB). Increased risk of bone fractures. Hypothalamic-pituitary-adrenal dysregulation. Increased risk of overdose.  RISKS: Fractures and Falls:  Opioids increase the risk and incidence of falls. This is of particular importance in elderly patients.  Endocrine System:  Long-term administration is associated with endocrine abnormalities (endocrinopathies). (Also known as Opioid-induced Endocrinopathy) Influences  on both the hypothalamic-pituitary-adrenal axis?and the hypothalamic-pituitary-gonadal axis have been demonstrated with consequent hypogonadism and adrenal insufficiency in both sexes. Hypogonadism and decreased levels of dehydroepiandrosterone sulfate have been reported in men and women. Endocrine effects include: Amenorrhoea in women (abnormal absence of menstruation) Reduced libido in both sexes Decreased sexual function Erectile dysfunction in men Hypogonadisms (decreased testicular function with shrinkage of testicles) Infertility Depression and fatigue Loss of muscle mass Anxiety Depression Immune suppression Hyperalgesia Weight gain Anemia Osteoporosis Patients (particularly women of childbearing age) should avoid opioids. There is insufficient evidence to recommend routine monitoring of asymptomatic patients taking opioids in the long-term for hormonal deficiencies.  Immune System: Human studies have demonstrated that opioids have an immunomodulating effect. These effects are mediated via opioid receptors both on immune effector cells and in the central nervous system. Opioids have been demonstrated to have adverse effects on antimicrobial response and anti-tumour surveillance. Buprenorphine has been demonstrated to have no impact on immune function.  Opioid Induced Hyperalgesia: Human studies have demonstrated that prolonged use of opioids can lead to a state of abnormal pain sensitivity, sometimes called opioid induced hyperalgesia (OIH). Opioid induced hyperalgesia is not usually seen in the absence of tolerance to opioid analgesia. Clinically, hyperalgesia may be diagnosed if the patient on long-term opioid therapy presents with increased pain. This might be qualitatively and anatomically distinct from pain related to disease progression or to breakthrough pain resulting from development of opioid tolerance. Pain associated with hyperalgesia tends to be more diffuse than the  pre-existing pain and less defined in quality. Management of opioid induced hyperalgesia requires opioid dose reduction.  Cancer: Chronic opioid therapy has been associated with an increased risk of cancer among noncancer patients with chronic pain. This association was more evident in chronic strong opioid users. Chronic opioid consumption causes significant pathological changes in the small intestine and colon. Epidemiological studies have found that there is a link between opium dependence and initiation of gastrointestinal cancers. Cancer is the second leading cause of death after cardiovascular disease. Chronic use of opioids can cause multiple conditions such as GERD, immunosuppression and renal damage as well as carcinogenic effects, which are associated with the incidence of cancers.   Mortality: Long-term opioid use   has been associated with increased mortality among patients with chronic non-cancer pain (CNCP).  Prescription of long-acting opioids for chronic noncancer pain was associated with a significantly increased risk of all-cause mortality, including deaths from causes other than overdose.  Reference: Von Korff M, Kolodny A, Deyo RA, Chou R. Long-term opioid therapy reconsidered. Ann Intern Med. 2011 Sep 6;155(5):325-8. doi: 10.7326/[id number]. PMID: 21893626; PMCID: PMC3280085. Bedson J, Chen Y, Ashworth J, Hayward RA, Dunn KM, Jordan KP. Risk of adverse events in patients prescribed long-term opioids: A cohort study in the [place] Clinical Practice Research Datalink. Eur J Pain. 2019 May;23(5):908-922. doi: 10.1002/ejp.1357. Epub 2019 Jan 31. PMID: 30620116. Colameco S, Coren JS, Ciervo CA. Continuous opioid treatment for chronic noncancer pain: a time for moderation in prescribing. Postgrad Med. 2009 Jul;121(4):61-6. doi: 10.3810/pgm.2009.07.2032. PMID: 19641271. Chou R, Turner JA, Devine EB, Hansen RN, Sullivan SD, Blazina I, Dana T, Bougatsos C, Deyo RA. The  effectiveness and risks of long-term opioid therapy for chronic pain: a systematic review for a National Institutes of Health Pathways to Prevention Workshop. Ann Intern Med. 2015 Feb 17;162(4):276-86. doi: 10.7326/M14-2559. PMID: 25581257. Warner M, Chen LH, Makuc DM. NCHS Data Brief No. 22. [place]: Centers for Disease Control and Prevention; 2009. Sep, Increase in Fatal Poisonings Involving Opioid Analgesics in the [place], 1999-2006. Song IA, Choi HR, Oh TK. Long-term opioid use and mortality in patients with chronic non-cancer pain: Ten-year follow-up study in [place] from 2010 through 2019. EClinicalMedicine. 2022 Jul 18;51:101558. doi: 10.1016/j.eclinm.2022.101558. PMID: 35875817; PMCID: PMC9304910. Huser, W., Schubert, T., Vogelmann, T. et al. All-cause mortality in patients with long-term opioid therapy compared with non-opioid analgesics for chronic non-cancer pain: a database study. BMC Med 18, 162 (2020). https://doi.org/10.1186/[email or web address] Rashidian H, Zendehdel K, Kamangar F, Malekzadeh R, Haghdoost AA. An Ecological Study of the Association between Opiate Use and Incidence of Cancers. Addict Health. 2016 Fall;8(4):252-260. PMID: 28819556; PMCID: PMC5554805.  Our Goal: Our goal is to control your pain with means other than the use of opioid pain medications.  Our Recommendation: Talk to your physician about coming off of these medications. We can assist you with the tapering down and stopping these medicines. Based on the new information, even if you cannot completely stop the medication, a decrease in the dose may be associated with a lesser risk. Ask for other means of controlling the pain. Decrease or eliminate those factors that significantly contribute to your pain such as smoking, obesity, and a diet heavily tilted towards "inflammatory" nutrients.  Last Updated: 03/16/2022    ____________________________________________________________________________________________     ____________________________________________________________________________________________  Patient Information update  To: All of our patients.  Re: Name change.  It has been made official that our current name, "[facility]"   will soon be changed to "[facility]".   The purpose of this change is to eliminate any confusion created by the concept of our practice being a "Medication Management Pain Clinic". In the past this has led to the misconception that we treat pain primarily by the use of prescription medications.  Nothing can be farther from the truth.   Understanding PAIN MANAGEMENT: To further understand what our practice does, you first have to understand that "Pain Management" is a subspecialty that requires additional training once a physician has completed their specialty training, which can be in either Anesthesia, Neurology, Psychiatry, or Physical Medicine and Rehabilitation (PMR). Each one of these contributes to the final approach taken by each physician to   the management of their patient's pain. To be a "Pain Management Specialist" you must have first completed one of the specialty trainings below.  Anesthesiologists - trained in clinical pharmacology and interventional techniques such as nerve blockade and regional as well as central neuroanatomy. They are trained to block pain before, during, and after surgical interventions.  Neurologists - trained in the diagnosis and pharmacological treatment of complex neurological conditions, such as Multiple Sclerosis, Parkinson's, spinal cord injuries, and other systemic conditions that may be associated with symptoms that may include but are not limited to pain. They tend to rely primarily on the treatment of chronic pain  using prescription medications.  Psychiatrist - trained in conditions affecting the psychosocial wellbeing of patients including but not limited to depression, anxiety, schizophrenia, personality disorders, addiction, and other substance use disorders that may be associated with chronic pain. They tend to rely primarily on the treatment of chronic pain using prescription medications.   Physical Medicine and Rehabilitation (PMR) physicians, also known as physiatrists - trained to treat a wide variety of medical conditions affecting the brain, spinal cord, nerves, bones, joints, ligaments, muscles, and tendons. Their training is primarily aimed at treating patients that have suffered injuries that have caused severe physical impairment. Their training is primarily aimed at the physical therapy and rehabilitation of those patients. They may also work alongside orthopedic surgeons or neurosurgeons using their expertise in assisting surgical patients to recover after their surgeries.  INTERVENTIONAL PAIN MANAGEMENT is sub-subspecialty of Pain Management.  Our physicians are Board-certified in Anesthesia, Pain Management, and Interventional Pain Management.  This meaning that not only have they been trained and Board-certified in their specialty of Anesthesia, and subspecialty of Pain Management, but they have also received further training in the sub-subspecialty of Interventional Pain Management, in order to become Board-certified as INTERVENTIONAL PAIN MANAGEMENT SPECIALIST.    Mission: Our goal is to use our skills in  INTERVENTIONAL PAIN MANAGEMENT as alternatives to the chronic use of prescription opioid medications for the treatment of pain. To make this more clear, we have changed our name to reflect what we do and offer. We will continue to offer medication management assessment and recommendations, but we will not be taking over any patient's medication  management.  ____________________________________________________________________________________________     ____________________________________________________________________________________________  National Pain Medication Shortage  The [place] is experiencing worsening drug shortages. These have had a negative widespread effect on patient care and treatment. Not expected to improve any time soon. Predicted to last past 2029.   Drug shortage list (generic names) Oxycodone IR Oxycodone/APAP Oxymorphone IR Hydromorphone Hydrocodone/APAP Morphine  Where is the problem?  Manufacturing and supply level.  Will this shortage affect you?  Only if you take any of the above pain medications.  How? You may be unable to fill your prescription.  Your pharmacist may offer a "partial fill" of your prescription. (Warning: Do not accept partial fills.) Prescriptions partially filled cannot be transferred to another pharmacy. Read our Medication Rules and Regulation. Depending on how much medicine you are dependent on, you may experience withdrawals when unable to get the medication.  Recommendations: Consider ending your dependence on opioid pain medications. Ask your pain specialist to assist you with the process. Consider switching to a medication currently not in shortage, such as Buprenorphine. Talk to your pain specialist about this option. Consider decreasing your pain medication requirements by managing tolerance thru "Drug Holidays". This may help minimize withdrawals, should you run out of medicine. Control your pain thru   the use of non-pharmacological interventional therapies.   Your prescriber: Prescribers cannot be blamed for shortages. Medication manufacturing and supply issues cannot be fixed by the prescriber.   NOTE: The prescriber is not responsible for supplying the medication, or solving supply issues. Work with your pharmacist to solve it. The patient is responsible for  the decision to take or continue taking the medication and for identifying and securing a legal supply source. By law, supplying the medication is the job and responsibility of the pharmacy. The prescriber is responsible for the evaluation, monitoring, and prescribing of these medications.   Prescribers will NOT: Re-issue prescriptions that have been partially filled. Re-issue prescriptions already sent to a pharmacy.  Re-send prescriptions to a different pharmacy because yours did not have your medication. Ask pharmacist to order more medicine or transfer the prescription to another pharmacy. (Read below.)  New 2023 regulation: "September 17, 2021 Revised Regulation Allows DEA-Registered Pharmacies to Transfer Electronic Prescriptions at a Patient's Request DEA Headquarters Division - Public Information Office Patients now have the ability to request their electronic prescription be transferred to another pharmacy without having to go back to their practitioner to initiate the request. This revised regulation went into effect on Monday, September 13, 2021.     At a patient's request, a DEA-registered retail pharmacy can now transfer an electronic prescription for a controlled substance (schedules II-V) to another DEA-registered retail pharmacy. Prior to this change, patients would have to go through their practitioner to cancel their prescription and have it re-issued to a different pharmacy. The process was taxing and time consuming for both patients and practitioners.    The Drug Enforcement Administration (DEA) published its intent to revise the process for transferring electronic prescriptions on December 06, 2019.  The final rule was published in the federal register on August 12, 2021 and went into effect 30 days later.  Under the final rule, a prescription can only be transferred once between pharmacies, and only if allowed under existing state or other applicable law. The prescription must  remain in its electronic form; may not be altered in any way; and the transfer must be communicated directly between two licensed pharmacists. It's important to note, any authorized refills transfer with the original prescription, which means the entire prescription will be filled at the same pharmacy".  Reference: https://www.dea.gov/stories/2023/2023-09/2021-09-01/revised-regulation-allows-dea-registered-pharmacies-transfer (DEA website announcement)  https://www.govinfo.gov/content/pkg/FR-2021-08-12/pdf/2023-15847.pdf (Federal Register  Department of Justice)   Federal Register / Vol. 88, No. 143 / Thursday, August 12, 2021 / Rules and Regulations DEPARTMENT OF JUSTICE  Drug Enforcement Administration  21 CFR Part 1306  [Docket No. [id number]]  RIN [id number]-AB64 Transfer of Electronic Prescriptions for Schedules II-V Controlled Substances Between Pharmacies for Initial Filling  ____________________________________________________________________________________________     ____________________________________________________________________________________________  Transfer of Pain Medication between Pharmacies  Re: 2023 DEA Clarification on existing regulation  Published on DEA Website: September 17, 2021  Title: Revised Regulation Allows DEA-Registered Pharmacies to Transfer Electronic Prescriptions at a Patient's Request DEA Headquarters Division - Public Information Office  "Patients now have the ability to request their electronic prescription be transferred to another pharmacy without having to go back to their practitioner to initiate the request. This revised regulation went into effect on Monday, September 13, 2021.     At a patient's request, a DEA-registered retail pharmacy can now transfer an electronic prescription for a controlled substance (schedules II-V) to another DEA-registered retail pharmacy. Prior to this change, patients would have to go through their practitioner to  cancel their prescription   and have it re-issued to a different pharmacy. The process was taxing and time consuming for both patients and practitioners.    The Drug Enforcement Administration (DEA) published its intent to revise the process for transferring electronic prescriptions on December 06, 2019.  The final rule was published in the federal register on August 12, 2021 and went into effect 30 days later.  Under the final rule, a prescription can only be transferred once between pharmacies, and only if allowed under existing state or other applicable law. The prescription must remain in its electronic form; may not be altered in any way; and the transfer must be communicated directly between two licensed pharmacists. It's important to note, any authorized refills transfer with the original prescription, which means the entire prescription will be filled at the same pharmacy."    REFERENCES: 1. DEA website announcement https://www.dea.gov/stories/2023/2023-09/2021-09-01/revised-regulation-allows-dea-registered-pharmacies-transfer  2. Department of Justice website  https://www.govinfo.gov/content/pkg/FR-2021-08-12/pdf/2023-15847.pdf  3. DEPARTMENT OF JUSTICE Drug Enforcement Administration 21 CFR Part 1306 [Docket No. [id number]] RIN [id number]-AB64 "Transfer of Electronic Prescriptions for Schedules II-V Controlled Substances Between Pharmacies for Initial Filling"  ____________________________________________________________________________________________     _______________________________________________________________________  Medication Rules  Purpose: To inform patients, and their family members, of our medication rules and regulations.  Applies to: All patients receiving prescriptions from our practice (written or electronic).  Pharmacy of record: This is the pharmacy where your electronic prescriptions will be sent. Make sure we have the correct one.  Electronic prescriptions: In  compliance with the [place] Strengthen Opioid Misuse Prevention (STOP) Act of 2017 (Session Law 2017-74/H243), effective January 17, 2018, all controlled substances must be electronically prescribed. Written prescriptions, faxing, or calling prescriptions to a pharmacy will no longer be done.  Prescription refills: These will be provided only during in-person appointments. No medications will be renewed without a "face-to-face" evaluation with your provider. Applies to all prescriptions.  NOTE: The following applies primarily to controlled substances (Opioid* Pain Medications).   Type of encounter (visit): For patients receiving controlled substances, face-to-face visits are required. (Not an option and not up to the patient.)  Patient's responsibilities: Pain Pills: Bring all pain pills to every appointment (except for procedure appointments). Pill Bottles: Bring pills in original pharmacy bottle. Bring bottle, even if empty. Always bring the bottle of the most recent fill.  Medication refills: You are responsible for knowing and keeping track of what medications you are taking and when is it that you will need a refill. The day before your appointment: write a list of all prescriptions that need to be refilled. The day of the appointment: give the list to the admitting nurse. Prescriptions will be written only during appointments. No prescriptions will be written on procedure days. If you forget a medication: it will not be "Called in", "Faxed", or "electronically sent". You will need to get another appointment to get these prescribed. No early refills. Do not call asking to have your prescription filled early. Partial  or short prescriptions: Occasionally your pharmacy may not have enough pills to fill your prescription.  NEVER ACCEPT a partial fill or a prescription that is short of the total amount of pills that you were prescribed.  With controlled substances the law allows 72 hours for  the pharmacy to complete the prescription.  If the prescription is not completed within 72 hours, the pharmacist will require a new prescription to be written. This means that you will be short on your medicine and we WILL NOT send another prescription to complete your original   prescription.  Instead, request the pharmacy to send a carrier to a nearby branch to get enough medication to provide you with your full prescription. Prescription Accuracy: You are responsible for carefully inspecting your prescriptions before leaving our office. Have the discharge nurse carefully go over each prescription with you, before taking them home. Make sure that your name is accurately spelled, that your address is correct. Check the name and dose of your medication to make sure it is accurate. Check the number of pills, and the written instructions to make sure they are clear and accurate. Make sure that you are given enough medication to last until your next medication refill appointment. Taking Medication: Take medication as prescribed. When it comes to controlled substances, taking less pills or less frequently than prescribed is permitted and encouraged. Never take more pills than instructed. Never take the medication more frequently than prescribed.  Inform other Doctors: Always inform, all of your healthcare providers, of all the medications you take. Pain Medication from other Providers: You are not allowed to accept any additional pain medication from any other Doctor or Healthcare provider. There are two exceptions to this rule. (see below) In the event that you require additional pain medication, you are responsible for notifying us, as stated below. Cough Medicine: Often these contain an opioid, such as codeine or hydrocodone. Never accept or take cough medicine containing these opioids if you are already taking an opioid* medication. The combination may cause respiratory failure and death. Medication Agreement:  You are responsible for carefully reading and following our Medication Agreement. This must be signed before receiving any prescriptions from our practice. Safely store a copy of your signed Agreement. Violations to the Agreement will result in no further prescriptions. (Additional copies of our Medication Agreement are available upon request.) Laws, Rules, & Regulations: All patients are expected to follow all Federal and State Laws, Statutes, Rules, & Regulations. Ignorance of the Laws does not constitute a valid excuse.  Illegal drugs and Controlled Substances: The use of illegal substances (including, but not limited to marijuana and its derivatives) and/or the illegal use of any controlled substances is strictly prohibited. Violation of this rule may result in the immediate and permanent discontinuation of any and all prescriptions being written by our practice. The use of any illegal substances is prohibited. Adopted CDC guidelines & recommendations: Target dosing levels will be at or below 60 MME/day. Use of benzodiazepines** is not recommended.  Exceptions: There are only two exceptions to the rule of not receiving pain medications from other Healthcare Providers. Exception #1 (Emergencies): In the event of an emergency (i.e.: accident requiring emergency care), you are allowed to receive additional pain medication. However, you are responsible for: As soon as you are able, call our office [phone], at any time of the day or night, and leave a message stating your name, the date and nature of the emergency, and the name and dose of the medication prescribed. In the event that your call is answered by a member of our staff, make sure to document and save the date, time, and the name of the person that took your information.  Exception #2 (Planned Surgery): In the event that you are scheduled by another doctor or dentist to have any type of surgery or procedure, you are allowed (for a period no  longer than 30 days), to receive additional pain medication, for the acute post-op pain. However, in this case, you are responsible for picking up a copy of   our "Post-op Pain Management for Surgeons" handout, and giving it to your surgeon or dentist. This document is available at our office, and does not require an appointment to obtain it. Simply go to our office during business hours (Monday-Thursday from 8:00 AM to 4:00 PM) (Friday 8:00 AM to 12:00 Noon) or if you have a scheduled appointment with us, prior to your surgery, and ask for it by name. In addition, you are responsible for: calling our office [phone], at any time of the day or night, and leaving a message stating your name, name of your surgeon, type of surgery, and date of procedure or surgery. Failure to comply with your responsibilities may result in termination of therapy involving the controlled substances. Medication Agreement Violation. Following the above rules, including your responsibilities will help you in avoiding a Medication Agreement Violation ("Breaking your Pain Medication Contract").  Consequences:  Not following the above rules may result in permanent discontinuation of medication prescription therapy.  *Opioid medications include: morphine, codeine, oxycodone, oxymorphone, hydrocodone, hydromorphone, meperidine, tramadol, tapentadol, buprenorphine, fentanyl, methadone. **Benzodiazepine medications include: diazepam (Valium), alprazolam (Xanax), clonazepam (Klonopine), lorazepam (Ativan), clorazepate (Tranxene), chlordiazepoxide (Librium), estazolam (Prosom), oxazepam (Serax), temazepam (Restoril), triazolam (Halcion) (Last updated: 11/09/2021) ______________________________________________________________________    ______________________________________________________________________  Medication Recommendations and Reminders  Applies to: All patients receiving prescriptions (written and/or  electronic).  Medication Rules & Regulations: You are responsible for reading, knowing, and following our "Medication Rules" document. These exist for your safety and that of others. They are not flexible and neither are we. Dismissing or ignoring them is an act of "non-compliance" that may result in complete and irreversible termination of such medication therapy. For safety reasons, "non-compliance" will not be tolerated. As with the [place] fundamental legal principle of "ignorance of the law is no defense", we will accept no excuses for not having read and knowing the content of documents provided to you by our practice.  Pharmacy of record:  Definition: This is the pharmacy where your electronic prescriptions will be sent.  We do not endorse any particular pharmacy. It is up to you and your insurance to decide what pharmacy to use.  We do not restrict you in your choice of pharmacy. However, once we write for your prescriptions, we will NOT be re-sending more prescriptions to fix restricted supply problems created by your pharmacy, or your insurance.  The pharmacy listed in the electronic medical record should be the one where you want electronic prescriptions to be sent. If you choose to change pharmacy, simply notify our nursing staff. Changes will be made only during your regular appointments and not over the phone.  Recommendations: Keep all of your pain medications in a safe place, under lock and key, even if you live alone. We will NOT replace lost, stolen, or damaged medication. We do not accept "Police Reports" as proof of medications having been stolen. After you fill your prescription, take 1 week's worth of pills and put them away in a safe place. You should keep a separate, properly labeled bottle for this purpose. The remainder should be kept in the original bottle. Use this as your primary supply, until it runs out. Once it's gone, then you know that you have 1 week's worth of medicine,  and it is time to come in for a prescription refill. If you do this correctly, it is unlikely that you will ever run out of medicine. To make sure that the above recommendation works, it is very important that you make   sure your medication refill appointments are scheduled at least 1 week before you run out of medicine. To do this in an effective manner, make sure that you do not leave the office without scheduling your next medication management appointment. Always ask the nursing staff to show you in your prescription , when your medication will be running out. Then arrange for the receptionist to get you a return appointment, at least 7 days before you run out of medicine. Do not wait until you have 1 or 2 pills left, to come in. This is very poor planning and does not take into consideration that we may need to cancel appointments due to bad weather, sickness, or emergencies affecting our staff. DO NOT ACCEPT A "Partial Fill": If for any reason your pharmacy does not have enough pills/tablets to completely fill or refill your prescription, do not allow for a "partial fill". The law allows the pharmacy to complete that prescription within 72 hours, without requiring a new prescription. If they do not fill the rest of your prescription within those 72 hours, you will need a separate prescription to fill the remaining amount, which we will NOT provide. If the reason for the partial fill is your insurance, you will need to talk to the pharmacist about payment alternatives for the remaining tablets, but again, DO NOT ACCEPT A PARTIAL FILL, unless you can trust your pharmacist to obtain the remainder of the pills within 72 hours.  Prescription refills and/or changes in medication(s):  Prescription refills, and/or changes in dose or medication, will be conducted only during scheduled medication management appointments. (Applies to both, written and electronic prescriptions.) No refills on procedure days. No  medication will be changed or started on procedure days. No changes, adjustments, and/or refills will be conducted on a procedure day. Doing so will interfere with the diagnostic portion of the procedure. No phone refills. No medications will be "called into the pharmacy". No Fax refills. No weekend refills. No Holliday refills. No after hours refills.  Remember:  Business hours are:  Monday to Thursday 8:00 AM to 4:00 PM Provider's Schedule: Idris Edmundson, MD - Appointments are:  Medication management: Monday and Wednesday 8:00 AM to 4:00 PM Procedure day: Tuesday and Thursday 7:30 AM to 4:00 PM Bilal Lateef, MD - Appointments are:  Medication management: Tuesday and Thursday 8:00 AM to 4:00 PM Procedure day: Monday and Wednesday 7:30 AM to 4:00 PM (Last update: 11/09/2021) ______________________________________________________________________    ____________________________________________________________________________________________  Drug Holidays  What is a "Drug Holiday"? Drug Holiday: is the name given to the process of slowly tapering down and temporarily stopping the pain medication for the purpose of decreasing or eliminating tolerance to the drug.  Benefits Improved effectiveness Decreased required effective dose Improved pain control End dependence on high dose therapy Decrease cost of therapy Uncovering "opioid-induced hyperalgesia". (OIH)  What is "opioid hyperalgesia"? It is a paradoxical increase in pain caused by exposure to opioids. Stopping the opioid pain medication, contrary to the expected, it actually decreases or completely eliminates the pain. Ref.: "A comprehensive review of opioid-induced hyperalgesia". Marion Lee, et.al. Pain Physician. 2011 Mar-Apr;14(2):145-61.  What is tolerance? Tolerance: the progressive loss of effectiveness of a pain medicine due to repetitive use. A common problem of opioid pain medications.  How long should a "Drug  Holiday" last? Effectiveness depends on the patient staying off all opioid pain medicines for a minimum of 14 consecutive days. (2 weeks)  How about just taking less of the medicine? Does not   work. Will not accomplish goal of eliminating the excess receptors.  How about switching to a different pain medicine? (AKA. "Opioid rotation") Does not work. Creates the illusion of effectiveness by taking advantage of inaccurate equivalent dose calculations between different opioids. -This "technique" was promoted by studies funded by pharmaceutical companies, such as [employer], creators of "OxyContin".  Can I stop the medicine "cold turkey"? We do not recommend it. You should always coordinate with your prescribing physician to make the transition as smoothly as possible. Avoid stopping the medicine abruptly without consulting. We recommend a "slow taper".  What is a slow taper? Taper: refers to the gradual decrease in dose.   How do I stop/taper the dose? Slowly. Decrease the daily amount of pills that you take by one (1) pill every seven (7) days. This is called a "slow downward taper". Example: if you normally take four (4) pills per day, drop it to three (3) pills per day for seven (7) days, then to two (2) pills per day for seven (7) days, then to one (1) per day for seven (7) days, and then stop the medicine. The 14 day "Drug Holiday" starts on the first day without medicine.   Will I experience withdrawals? Unlikely with a slow taper.  What triggers withdrawals? Withdrawals are triggered by the sudden/abrupt stop of high dose opioids. Withdrawals can be avoided by slowly decreasing the dose over a prolonged period of time.  What are withdrawals? Symptoms associated with sudden/abrupt reduction/stopping of high-dose, long-term use of pain medication. Withdrawal are seldom seen on low dose therapy, or patients rarely taking opioid medication.  Early Withdrawal Symptoms may  include: Agitation Anxiety Muscle aches Increased tearing Insomnia Runny nose Sweating Yawning  Late symptoms may include: Abdominal cramping Diarrhea Dilated pupils Goose bumps Nausea Vomiting  When could I see withdrawals? Onset: 8-24 hours after last use for most opioids. 12-48 hours for long-acting opioids (i.e.: methadone)  How long could they last? Duration: 4-10 days for most opioids. 14-21 days for long-acting opioids (i.e.: methadone)  What will happen after I complete my "Drug Holiday"? The need and indications for the opioid analgesic will be reviewed before restarting the medication. Dose requirements will likely decrease and the dose will need to be adjusted accordingly.   (Last update: 04/06/2022) ____________________________________________________________________________________________    ____________________________________________________________________________________________  WARNING: CBD (cannabidiol) & Delta (Delta-8 tetrahydrocannabinol) products.   Applicable to:  All individuals currently taking or considering taking CBD (cannabidiol) and, more important, all patients taking opioid analgesic controlled substances (pain medication). (Example: oxycodone; oxymorphone; hydrocodone; hydromorphone; morphine; methadone; tramadol; tapentadol; fentanyl; buprenorphine; butorphanol; dextromethorphan; meperidine; codeine; etc.)  Introduction:  Recently there has been a drive towards the use of "natural" products for the treatment of different conditions, including pain anxiety and sleep disorders. Marijuana and hemp are two varieties of the cannabis genus plants. Marijuana and its derivatives are illegal, while hemp and its derivatives are not. Cannabidiol (CBD) and tetrahydrocannabinol (THC), are two natural compounds found in plants of the Cannabis genus. They can both be extracted from hemp or marijuana. Both compounds interact with your body's endocannabinoid  system in very different ways. CBD is associated with pain relief (analgesia) while THC is associated with the psychoactive effects ("the high") obtained from the use of marijuana products. There are two main types of THC: Delta-9, which comes from the marijuana plant and it is illegal, and Delta-8, which comes from the hemp plant, and it is legal. (Both, Delta-9-THC and Delta-8-THC are psychoactive and   give you "the high".)   Legality:  Marijuana and its derivatives: illegal Hemp and its derivatives: Legal (State dependent) UPDATE: (03/05/2021) The Drug Enforcement Agency (DEA) issued a letter stating that "delta" cannabinoids, including Delta-8-THCO and Delta-9-THCO, synthetically derived from hemp do not qualify as hemp and will be viewed as Schedule I drugs. (Schedule I drugs, substances, or chemicals are defined as drugs with no currently accepted medical use and a high potential for abuse. Some examples of Schedule I drugs are: heroin, lysergic acid diethylamide (LSD), marijuana (cannabis), 3,4-methylenedioxymethamphetamine (ecstasy), methaqualone, and peyote.) (https://www.dea.gov)  Legal status of CBD in [place]:  "Conditionally Legal"  Reference: "FDA Regulation of Cannabis and Cannabis-Derived Products, Including Cannabidiol (CBD)" - https://www.fda.gov/news-events/public-health-focus/fda-regulation-cannabis-and-cannabis-derived-products-including-cannabidiol-cbd  Warning:  CBD is not FDA approved and has not undergo the same manufacturing controls as prescription drugs.  This means that the purity and safety of available CBD may be questionable. Most of the time, despite manufacturer's claims, it is contaminated with THC (delta-9-tetrahydrocannabinol - the chemical in marijuana responsible for the "HIGH").  When this is the case, the THC contaminant will trigger a positive urine drug screen (UDS) test for Marijuana (carboxy-THC).   The FDA recently put out a warning about 5 things that everyone  should be aware of regarding Delta-8 THC: Delta-8 THC products have not been evaluated or approved by the FDA for safe use and may be marketed in ways that put the public health at risk. The FDA has received adverse event reports involving delta-8 THC-containing products. Delta-8 THC has psychoactive and intoxicating effects. Delta-8 THC manufacturing often involve use of potentially harmful chemicals to create the concentrations of delta-8 THC claimed in the marketplace. The final delta-8 THC product may have potentially harmful by-products (contaminants) due to the chemicals used in the process. Manufacturing of delta-8 THC products may occur in uncontrolled or unsanitary settings, which may lead to the presence of unsafe contaminants or other potentially harmful substances. Delta-8 THC products should be kept out of the reach of children and pets.  NOTE: Because a positive UDS for any illicit substance is a violation of our medication agreement, your opioid analgesics (pain medicine) may be permanently discontinued.  MORE ABOUT CBD  General Information: CBD was discovered in 1940 and it is a derivative of the cannabis sativa genus plants (Marijuana and Hemp). It is one of the 113 identified substances found in Marijuana. It accounts for up to 40% of the plant's extract. As of 2018, preliminary clinical studies on CBD included research for the treatment of anxiety, movement disorders, and pain. CBD is available and consumed in multiple forms, including inhalation of smoke or vapor, as an aerosol spray, and by mouth. It may be supplied as an oil containing CBD, capsules, dried cannabis, or as a liquid solution. CBD is thought not to be as psychoactive as THC (delta-9-tetrahydrocannabinol - the chemical in marijuana responsible for the "HIGH"). Studies suggest that CBD may interact with different biological target receptors in the body, including cannabinoid and other neurotransmitter receptors. As of  2018 the mechanism of action for its biological effects has not been determined.  Side-effects  Adverse reactions: Dry mouth, diarrhea, decreased appetite, fatigue, drowsiness, malaise, weakness, sleep disturbances, and others.  Drug interactions:  CBD may interact with medications such as blood-thinners. CBD causes drowsiness on its own and it will increase drowsiness caused by other medications, including antihistamines (such as Benadryl), benzodiazepines (Xanax, Ativan, Valium), antipsychotics, antidepressants, opioids, alcohol and supplements such as kava, melatonin and St. John's Wort.    Other drug interactions: Brivaracetam (Briviact); Caffeine; Carbamazepine (Tegretol); Citalopram (Celexa); Clobazam (Onfi); Eslicarbazepine (Aptiom); Everolimus (Zostress); Lithium; Methadone (Dolophine); Rufinamide (Banzel); Sedative medications (CNS depressants); Sirolimus (Rapamune); Stiripentol (Diacomit); Tacrolimus (Prograf); Tamoxifen ; Soltamox); Topiramate (Topamax); Valproate; Warfarin (Coumadin); Zonisamide. (Last update: 12/27/2021) ____________________________________________________________________________________________   ____________________________________________________________________________________________  Naloxone Nasal Spray  Why am I receiving this medication? [place] STOP ACT requires that all patients taking high dose opioids or at risk of opioids respiratory depression, be prescribed an opioid reversal agent, such as Naloxone (AKA: Narcan).  What is this medication? NALOXONE (nal OX one) treats opioid overdose, which causes slow or shallow breathing, severe drowsiness, or trouble staying awake. Call emergency services after using this medication. You may need additional treatment. Naloxone works by reversing the effects of opioids. It belongs to a group of medications called opioid blockers.  COMMON BRAND NAME(S): Kloxxado, Narcan  What should I tell my care team before  I take this medication? They need to know if you have any of these conditions: Heart disease Substance use disorder An unusual or allergic reaction to naloxone, other medications, foods, dyes, or preservatives Pregnant or trying to get pregnant Breast-feeding  When to use this medication? This medication is to be used for the treatment of respiratory depression (less than 8 breaths per minute) secondary to opioid overdose.   How to use this medication? This medication is for use in the nose. Lay the person on their back. Support their neck with your hand and allow the head to tilt back before giving the medication. The nasal spray should be given into 1 nostril. After giving the medication, move the person onto their side. Do not remove or test the nasal spray until ready to use. Get emergency medical help right away after giving the first dose of this medication, even if the person wakes up. You should be familiar with how to recognize the signs and symptoms of a narcotic overdose. If more doses are needed, give the additional dose in the other nostril. Talk to your care team about the use of this medication in children. While this medication may be prescribed for children as young as newborns for selected conditions, precautions do apply.  Naloxone Overdosage: If you think you have taken too much of this medicine contact a poison control center or emergency room at once.  NOTE: This medicine is only for you. Do not share this medicine with others.  What if I miss a dose? This does not apply.  What may interact with this medication? This is only used during an emergency. No interactions are expected during emergency use. This list may not describe all possible interactions. Give your health care provider a list of all the medicines, herbs, non-prescription drugs, or dietary supplements you use. Also tell them if you smoke, drink alcohol, or use illegal drugs. Some items may interact with  your medicine.  What should I watch for while using this medication? Keep this medication ready for use in the case of an opioid overdose. Make sure that you have the phone number of your care team and local hospital ready. You may need to have additional doses of this medication. Each nasal spray contains a single dose. Some emergencies may require additional doses. After use, bring the treated person to the nearest hospital or call 911. Make sure the treating care team knows that the person has received a dose of this medication. You will receive additional instructions on what to do during and after use of this   medication before an emergency occurs.  What side effects may I notice from receiving this medication? Side effects that you should report to your care team as soon as possible: Allergic reactions--skin rash, itching, hives, swelling of the face, lips, tongue, or throat Side effects that usually do not require medical attention (report these to your care team if they continue or are bothersome): Constipation Dryness or irritation inside the nose Headache Increase in blood pressure Muscle spasms Stuffy nose Toothache This list may not describe all possible side effects. Call your doctor for medical advice about side effects. You may report side effects to FDA at 1-800-FDA-1088.  Where should I keep my medication? Because this is an emergency medication, you should keep it with you at all times.  Keep out of the reach of children and pets. Store between 20 and 25 degrees C (68 and 77 degrees F). Do not freeze. Throw away any unused medication after the expiration date. Keep in original box until ready to use.  NOTE: This sheet is a summary. It may not cover all possible information. If you have questions about this medicine, talk to your doctor, pharmacist, or health care provider.   2023 Elsevier/Gold Standard (2020-09-11  00:00:00)  ____________________________________________________________________________________________   

## 2022-05-25 ENCOUNTER — Encounter: Payer: Self-pay | Admitting: Pain Medicine

## 2022-05-25 ENCOUNTER — Ambulatory Visit: Payer: 59 | Attending: Pain Medicine | Admitting: Pain Medicine

## 2022-05-25 VITALS — BP 121/83 | HR 88 | Temp 97.6°F | Resp 18 | Ht 72.0 in | Wt >= 6400 oz

## 2022-05-25 DIAGNOSIS — M25552 Pain in left hip: Secondary | ICD-10-CM | POA: Insufficient documentation

## 2022-05-25 DIAGNOSIS — M25512 Pain in left shoulder: Secondary | ICD-10-CM | POA: Insufficient documentation

## 2022-05-25 DIAGNOSIS — Z79891 Long term (current) use of opiate analgesic: Secondary | ICD-10-CM

## 2022-05-25 DIAGNOSIS — M25551 Pain in right hip: Secondary | ICD-10-CM | POA: Insufficient documentation

## 2022-05-25 DIAGNOSIS — M5442 Lumbago with sciatica, left side: Secondary | ICD-10-CM | POA: Diagnosis not present

## 2022-05-25 DIAGNOSIS — M5136 Other intervertebral disc degeneration, lumbar region: Secondary | ICD-10-CM | POA: Diagnosis not present

## 2022-05-25 DIAGNOSIS — G894 Chronic pain syndrome: Secondary | ICD-10-CM

## 2022-05-25 DIAGNOSIS — M47816 Spondylosis without myelopathy or radiculopathy, lumbar region: Secondary | ICD-10-CM

## 2022-05-25 DIAGNOSIS — G8929 Other chronic pain: Secondary | ICD-10-CM | POA: Diagnosis not present

## 2022-05-25 DIAGNOSIS — M159 Polyosteoarthritis, unspecified: Secondary | ICD-10-CM | POA: Diagnosis not present

## 2022-05-25 DIAGNOSIS — M150 Primary generalized (osteo)arthritis: Secondary | ICD-10-CM

## 2022-05-25 DIAGNOSIS — M79604 Pain in right leg: Secondary | ICD-10-CM | POA: Diagnosis not present

## 2022-05-25 DIAGNOSIS — M5441 Lumbago with sciatica, right side: Secondary | ICD-10-CM | POA: Diagnosis not present

## 2022-05-25 DIAGNOSIS — Z79899 Other long term (current) drug therapy: Secondary | ICD-10-CM | POA: Diagnosis not present

## 2022-05-25 DIAGNOSIS — M129 Arthropathy, unspecified: Secondary | ICD-10-CM

## 2022-05-25 DIAGNOSIS — M25562 Pain in left knee: Secondary | ICD-10-CM | POA: Insufficient documentation

## 2022-05-25 DIAGNOSIS — M25561 Pain in right knee: Secondary | ICD-10-CM | POA: Insufficient documentation

## 2022-05-25 DIAGNOSIS — M79605 Pain in left leg: Secondary | ICD-10-CM | POA: Insufficient documentation

## 2022-05-25 DIAGNOSIS — M51369 Other intervertebral disc degeneration, lumbar region without mention of lumbar back pain or lower extremity pain: Secondary | ICD-10-CM

## 2022-05-25 MED ORDER — OXYCODONE HCL 10 MG PO TABS
10.0000 mg | ORAL_TABLET | Freq: Two times a day (BID) | ORAL | 0 refills | Status: DC
Start: 2022-06-30 — End: 2022-08-29

## 2022-05-25 MED ORDER — OXYCODONE HCL 10 MG PO TABS
10.0000 mg | ORAL_TABLET | Freq: Two times a day (BID) | ORAL | 0 refills | Status: DC
Start: 2022-07-30 — End: 2022-08-29

## 2022-05-25 MED ORDER — OXYCODONE HCL 10 MG PO TABS
10.0000 mg | ORAL_TABLET | Freq: Two times a day (BID) | ORAL | 0 refills | Status: DC
Start: 2022-05-31 — End: 2022-08-29

## 2022-05-25 NOTE — Progress Notes (Signed)
Safety precautions to be maintained throughout the outpatient stay will include: orient to surroundings, keep bed in low position, maintain call bell within reach at all times, provide assistance with transfer out of bed and ambulation.   Nursing Pain Medication Assessment:  Safety precautions to be maintained throughout the outpatient stay will include: orient to surroundings, keep bed in low position, maintain call bell within reach at all times, provide assistance with transfer out of bed and ambulation.  Medication Inspection Compliance: Pill count conducted under aseptic conditions, in front of the patient. Neither the pills nor the bottle was removed from the patient's sight at any time. Once count was completed pills were immediately returned to the patient in their original bottle.  Medication: Oxycodone IR Pill/Patch Count:  8 of 60 pills remain Pill/Patch Appearance: Markings consistent with prescribed medication Bottle Appearance: Standard pharmacy container. Clearly labeled. Filled Date: 04 / 14 / 2024 Last Medication intake:  Today

## 2022-05-30 ENCOUNTER — Ambulatory Visit: Payer: 59 | Admitting: Family Medicine

## 2022-06-15 DIAGNOSIS — R296 Repeated falls: Secondary | ICD-10-CM | POA: Diagnosis not present

## 2022-06-15 DIAGNOSIS — M5136 Other intervertebral disc degeneration, lumbar region: Secondary | ICD-10-CM | POA: Diagnosis not present

## 2022-06-15 DIAGNOSIS — G894 Chronic pain syndrome: Secondary | ICD-10-CM | POA: Diagnosis not present

## 2022-06-15 DIAGNOSIS — M150 Primary generalized (osteo)arthritis: Secondary | ICD-10-CM | POA: Diagnosis not present

## 2022-06-17 ENCOUNTER — Inpatient Hospital Stay
Admission: EM | Admit: 2022-06-17 | Discharge: 2022-06-21 | DRG: 638 | Disposition: A | Discharge status: Home Health | Payer: 59 | Attending: Internal Medicine | Admitting: Internal Medicine

## 2022-06-17 ENCOUNTER — Encounter: Payer: Self-pay | Admitting: Emergency Medicine

## 2022-06-17 DIAGNOSIS — Z8261 Family history of arthritis: Secondary | ICD-10-CM | POA: Diagnosis not present

## 2022-06-17 DIAGNOSIS — M129 Arthropathy, unspecified: Secondary | ICD-10-CM | POA: Diagnosis not present

## 2022-06-17 DIAGNOSIS — L03314 Cellulitis of groin: Secondary | ICD-10-CM | POA: Diagnosis present

## 2022-06-17 DIAGNOSIS — Z79899 Other long term (current) drug therapy: Secondary | ICD-10-CM

## 2022-06-17 DIAGNOSIS — E1140 Type 2 diabetes mellitus with diabetic neuropathy, unspecified: Secondary | ICD-10-CM | POA: Diagnosis not present

## 2022-06-17 DIAGNOSIS — Z8249 Family history of ischemic heart disease and other diseases of the circulatory system: Secondary | ICD-10-CM

## 2022-06-17 DIAGNOSIS — E1169 Type 2 diabetes mellitus with other specified complication: Secondary | ICD-10-CM

## 2022-06-17 DIAGNOSIS — R0902 Hypoxemia: Secondary | ICD-10-CM | POA: Diagnosis not present

## 2022-06-17 DIAGNOSIS — Z7951 Long term (current) use of inhaled steroids: Secondary | ICD-10-CM | POA: Diagnosis not present

## 2022-06-17 DIAGNOSIS — M5136 Other intervertebral disc degeneration, lumbar region: Secondary | ICD-10-CM | POA: Diagnosis present

## 2022-06-17 DIAGNOSIS — G894 Chronic pain syndrome: Secondary | ICD-10-CM | POA: Diagnosis present

## 2022-06-17 DIAGNOSIS — R252 Cramp and spasm: Secondary | ICD-10-CM | POA: Diagnosis not present

## 2022-06-17 DIAGNOSIS — E0800 Diabetes mellitus due to underlying condition with hyperosmolarity without nonketotic hyperglycemic-hyperosmolar coma (NKHHC): Principal | ICD-10-CM

## 2022-06-17 DIAGNOSIS — E1165 Type 2 diabetes mellitus with hyperglycemia: Secondary | ICD-10-CM | POA: Diagnosis present

## 2022-06-17 DIAGNOSIS — R531 Weakness: Secondary | ICD-10-CM | POA: Diagnosis not present

## 2022-06-17 DIAGNOSIS — Z743 Need for continuous supervision: Secondary | ICD-10-CM | POA: Diagnosis not present

## 2022-06-17 DIAGNOSIS — R739 Hyperglycemia, unspecified: Secondary | ICD-10-CM | POA: Diagnosis not present

## 2022-06-17 DIAGNOSIS — B372 Candidiasis of skin and nail: Secondary | ICD-10-CM

## 2022-06-17 DIAGNOSIS — Z7984 Long term (current) use of oral hypoglycemic drugs: Secondary | ICD-10-CM

## 2022-06-17 DIAGNOSIS — Z5320 Procedure and treatment not carried out because of patient's decision for unspecified reasons: Secondary | ICD-10-CM | POA: Diagnosis not present

## 2022-06-17 DIAGNOSIS — B9689 Other specified bacterial agents as the cause of diseases classified elsewhere: Secondary | ICD-10-CM | POA: Diagnosis not present

## 2022-06-17 DIAGNOSIS — Z79891 Long term (current) use of opiate analgesic: Secondary | ICD-10-CM

## 2022-06-17 DIAGNOSIS — J4530 Mild persistent asthma, uncomplicated: Secondary | ICD-10-CM | POA: Diagnosis present

## 2022-06-17 DIAGNOSIS — Z7901 Long term (current) use of anticoagulants: Secondary | ICD-10-CM | POA: Diagnosis not present

## 2022-06-17 DIAGNOSIS — F17210 Nicotine dependence, cigarettes, uncomplicated: Secondary | ICD-10-CM | POA: Diagnosis present

## 2022-06-17 DIAGNOSIS — Z6841 Body Mass Index (BMI) 40.0 and over, adult: Secondary | ICD-10-CM

## 2022-06-17 DIAGNOSIS — E785 Hyperlipidemia, unspecified: Secondary | ICD-10-CM | POA: Diagnosis not present

## 2022-06-17 DIAGNOSIS — M51369 Other intervertebral disc degeneration, lumbar region without mention of lumbar back pain or lower extremity pain: Secondary | ICD-10-CM | POA: Diagnosis present

## 2022-06-17 DIAGNOSIS — L0390 Cellulitis, unspecified: Secondary | ICD-10-CM

## 2022-06-17 DIAGNOSIS — E1100 Type 2 diabetes mellitus with hyperosmolarity without nonketotic hyperglycemic-hyperosmolar coma (NKHHC): Principal | ICD-10-CM | POA: Diagnosis present

## 2022-06-17 DIAGNOSIS — D72829 Elevated white blood cell count, unspecified: Secondary | ICD-10-CM

## 2022-06-17 DIAGNOSIS — I10 Essential (primary) hypertension: Secondary | ICD-10-CM | POA: Diagnosis present

## 2022-06-17 DIAGNOSIS — M170 Bilateral primary osteoarthritis of knee: Secondary | ICD-10-CM | POA: Diagnosis not present

## 2022-06-17 DIAGNOSIS — M47816 Spondylosis without myelopathy or radiculopathy, lumbar region: Secondary | ICD-10-CM | POA: Diagnosis not present

## 2022-06-17 DIAGNOSIS — R9431 Abnormal electrocardiogram [ECG] [EKG]: Secondary | ICD-10-CM | POA: Diagnosis not present

## 2022-06-17 DIAGNOSIS — F1190 Opioid use, unspecified, uncomplicated: Secondary | ICD-10-CM

## 2022-06-17 DIAGNOSIS — E1142 Type 2 diabetes mellitus with diabetic polyneuropathy: Secondary | ICD-10-CM | POA: Diagnosis not present

## 2022-06-17 DIAGNOSIS — R0689 Other abnormalities of breathing: Secondary | ICD-10-CM

## 2022-06-17 DIAGNOSIS — I499 Cardiac arrhythmia, unspecified: Secondary | ICD-10-CM | POA: Diagnosis not present

## 2022-06-17 DIAGNOSIS — E871 Hypo-osmolality and hyponatremia: Secondary | ICD-10-CM

## 2022-06-17 LAB — BLOOD GAS, VENOUS
Acid-Base Excess: 9.1 mmol/L — ABNORMAL HIGH (ref 0.0–2.0)
Bicarbonate: 38 mmol/L — ABNORMAL HIGH (ref 20.0–28.0)
O2 Saturation: 42.2 %
Patient temperature: 37
pCO2, Ven: 72 mmHg (ref 44–60)
pH, Ven: 7.33 (ref 7.25–7.43)
pO2, Ven: <31 mmHg — CL (ref 32–45)

## 2022-06-17 LAB — COMPREHENSIVE METABOLIC PANEL
ALT: 9 U/L (ref 0–44)
AST: 14 U/L — ABNORMAL LOW (ref 15–41)
Albumin: 3.9 g/dL (ref 3.5–5.0)
Alkaline Phosphatase: 135 U/L — ABNORMAL HIGH (ref 38–126)
Anion gap: 13 (ref 5–15)
BUN: 9 mg/dL (ref 6–20)
CO2: 29 mmol/L (ref 22–32)
Calcium: 8.7 mg/dL — ABNORMAL LOW (ref 8.9–10.3)
Chloride: 80 mmol/L — ABNORMAL LOW (ref 98–111)
Creatinine, Ser: 1 mg/dL (ref 0.61–1.24)
GFR, Estimated: >60 mL/min (ref >60)
Glucose, Bld: 760 mg/dL (ref 70–99)
Potassium: 4.6 mmol/L (ref 3.5–5.1)
Sodium: 122 mmol/L — ABNORMAL LOW (ref 135–145)
Total Bilirubin: 1.1 mg/dL (ref 0.3–1.2)
Total Protein: 6.8 g/dL (ref 6.5–8.1)

## 2022-06-17 LAB — CBC WITH DIFFERENTIAL/PLATELET
Abs Immature Granulocytes: 0.05 10*3/uL (ref 0.00–0.07)
Basophils Absolute: 0.1 10*3/uL (ref 0.0–0.1)
Basophils Relative: 1 %
Eosinophils Absolute: 0.1 10*3/uL (ref 0.0–0.5)
Eosinophils Relative: 1 %
HCT: 50.1 % (ref 39.0–52.0)
Hemoglobin: 16.5 g/dL (ref 13.0–17.0)
Immature Granulocytes: 0 %
Lymphocytes Relative: 12 %
Lymphs Abs: 1.5 10*3/uL (ref 0.7–4.0)
MCH: 28.9 pg (ref 26.0–34.0)
MCHC: 32.9 g/dL (ref 30.0–36.0)
MCV: 87.7 fL (ref 80.0–100.0)
Monocytes Absolute: 1.1 10*3/uL — ABNORMAL HIGH (ref 0.1–1.0)
Monocytes Relative: 9 %
Neutro Abs: 9.6 10*3/uL — ABNORMAL HIGH (ref 1.7–7.7)
Neutrophils Relative %: 77 %
Platelets: 294 10*3/uL (ref 150–400)
RBC: 5.71 MIL/uL (ref 4.22–5.81)
RDW: 14 % (ref 11.5–15.5)
WBC: 12.3 10*3/uL — ABNORMAL HIGH (ref 4.0–10.5)
nRBC: 0 % (ref 0.0–0.2)

## 2022-06-17 LAB — TROPONIN I (HIGH SENSITIVITY)
Troponin I (High Sensitivity): 6 ng/L (ref <18)
Troponin I (High Sensitivity): 7 ng/L (ref <18)

## 2022-06-17 LAB — URINALYSIS, ROUTINE W REFLEX MICROSCOPIC
Bacteria, UA: NONE SEEN
Bilirubin Urine: NEGATIVE
Glucose, UA: >=500 mg/dL — AB
Ketones, ur: NEGATIVE mg/dL
Nitrite: NEGATIVE
Protein, ur: NEGATIVE mg/dL
Specific Gravity, Urine: 1.026 (ref 1.005–1.030)
pH: 6 (ref 5.0–8.0)

## 2022-06-17 LAB — CBG MONITORING, ED
Glucose-Capillary: 493 mg/dL — ABNORMAL HIGH (ref 70–99)
Glucose-Capillary: >600 mg/dL (ref 70–99)

## 2022-06-17 LAB — BETA-HYDROXYBUTYRIC ACID: Beta-Hydroxybutyric Acid: 0.27 mmol/L (ref 0.05–0.27)

## 2022-06-17 MED ORDER — LACTATED RINGERS IV SOLN: INTRAVENOUS | Status: DC

## 2022-06-17 MED ORDER — SENNOSIDES-DOCUSATE SODIUM 8.6-50 MG PO TABS: 1.0000 | ORAL_TABLET | Freq: Every evening | ORAL | Status: DC | PRN

## 2022-06-17 MED ORDER — ACETAMINOPHEN 325 MG PO TABS
650.0000 mg | ORAL_TABLET | Freq: Four times a day (QID) | ORAL | Status: DC | PRN
  Administered 2022-06-20: 650 mg via ORAL
  Filled 2022-06-17: qty 2

## 2022-06-17 MED ORDER — LACTATED RINGERS IV BOLUS
2000.0000 mL | Freq: Once | INTRAVENOUS | Status: AC
  Administered 2022-06-17: 2000 mL via INTRAVENOUS

## 2022-06-17 MED ORDER — ONDANSETRON HCL 4 MG/2ML IJ SOLN: 4.0000 mg | Freq: Four times a day (QID) | INTRAMUSCULAR | Status: DC | PRN

## 2022-06-17 MED ORDER — KETOROLAC TROMETHAMINE 30 MG/ML IJ SOLN
30.0000 mg | Freq: Four times a day (QID) | INTRAMUSCULAR | Status: DC | PRN
  Administered 2022-06-18 – 2022-06-21 (×4): 30 mg via INTRAVENOUS
  Filled 2022-06-17 (×5): qty 1

## 2022-06-17 MED ORDER — DEXTROSE 50 % IV SOLN: 0.0000 mL | INTRAVENOUS | Status: DC | PRN

## 2022-06-17 MED ORDER — ATORVASTATIN CALCIUM 20 MG PO TABS
40.0000 mg | ORAL_TABLET | Freq: Every day | ORAL | Status: DC
  Administered 2022-06-18 – 2022-06-21 (×4): 40 mg via ORAL
  Filled 2022-06-17 (×4): qty 2

## 2022-06-17 MED ORDER — OXYCODONE HCL 5 MG PO TABS
10.0000 mg | ORAL_TABLET | ORAL | Status: DC | PRN
  Administered 2022-06-18 – 2022-06-21 (×6): 10 mg via ORAL
  Filled 2022-06-17 (×6): qty 2

## 2022-06-17 MED ORDER — ENOXAPARIN SODIUM 100 MG/ML IJ SOSY
90.0000 mg | PREFILLED_SYRINGE | INTRAMUSCULAR | Status: DC
  Administered 2022-06-18 – 2022-06-21 (×4): 90 mg via SUBCUTANEOUS
  Filled 2022-06-17 (×4): qty 1

## 2022-06-17 MED ORDER — ONDANSETRON HCL 4 MG PO TABS: 4.0000 mg | ORAL_TABLET | Freq: Four times a day (QID) | ORAL | Status: DC | PRN

## 2022-06-17 MED ORDER — HYDROMORPHONE HCL 1 MG/ML IJ SOLN
1.0000 mg | Freq: Once | INTRAMUSCULAR | Status: AC
  Administered 2022-06-17: 1 mg via INTRAVENOUS
  Filled 2022-06-17: qty 1

## 2022-06-17 MED ORDER — MICONAZOLE NITRATE POWD: Freq: Three times a day (TID) | Status: DC

## 2022-06-17 MED ORDER — SODIUM CHLORIDE 0.9 % IV BOLUS
1000.0000 mL | Freq: Once | INTRAVENOUS | Status: AC
  Administered 2022-06-17: 1000 mL via INTRAVENOUS

## 2022-06-17 MED ORDER — INSULIN REGULAR(HUMAN) IN NACL 100-0.9 UT/100ML-% IV SOLN
INTRAVENOUS | Status: DC
  Administered 2022-06-17: 19 [IU]/h via INTRAVENOUS
  Filled 2022-06-17: qty 100

## 2022-06-17 MED ORDER — PREGABALIN 75 MG PO CAPS
150.0000 mg | ORAL_CAPSULE | Freq: Two times a day (BID) | ORAL | Status: DC
  Administered 2022-06-18 – 2022-06-21 (×8): 150 mg via ORAL
  Filled 2022-06-17 (×8): qty 2

## 2022-06-17 MED ORDER — LOSARTAN POTASSIUM 25 MG PO TABS
25.0000 mg | ORAL_TABLET | Freq: Every day | ORAL | Status: DC
  Administered 2022-06-18 – 2022-06-21 (×4): 25 mg via ORAL
  Filled 2022-06-17 (×4): qty 1

## 2022-06-17 MED ORDER — NYSTATIN 100000 UNIT/GM EX POWD
Freq: Three times a day (TID) | CUTANEOUS | Status: DC
  Filled 2022-06-17 (×2): qty 15

## 2022-06-17 MED ORDER — DEXTROSE IN LACTATED RINGERS 5 % IV SOLN: INTRAVENOUS | Status: DC

## 2022-06-17 MED ORDER — IPRATROPIUM-ALBUTEROL 0.5-2.5 (3) MG/3ML IN SOLN: 3.0000 mL | RESPIRATORY_TRACT | Status: DC | PRN

## 2022-06-17 MED ORDER — LACTATED RINGERS IV BOLUS: 3500.0000 mL | Freq: Once | INTRAVENOUS | Status: DC

## 2022-06-17 MED ORDER — POTASSIUM CHLORIDE 10 MEQ/100ML IV SOLN
10.0000 meq | INTRAVENOUS | Status: AC
  Administered 2022-06-17: 10 meq via INTRAVENOUS
  Filled 2022-06-17: qty 100

## 2022-06-17 MED ORDER — ACETAMINOPHEN 650 MG RE SUPP: 650.0000 mg | Freq: Four times a day (QID) | RECTAL | Status: DC | PRN

## 2022-06-17 NOTE — Assessment & Plan Note (Addendum)
Chronic continuous use of opiates History of epidural steroid injections Multimodal pain control with home oxycodone and Lyrica Toradol for breakthrough with Dilaudid as needed Physical therapy evaluation

## 2022-06-17 NOTE — Assessment & Plan Note (Signed)
Suspect secondary to HHS No stigmata of infection

## 2022-06-17 NOTE — Assessment & Plan Note (Addendum)
Related to HHS and chronic physical deconditioning PT consult

## 2022-06-17 NOTE — ED Notes (Signed)
Pt placed on 3L [place] at this time.

## 2022-06-17 NOTE — ED Triage Notes (Signed)
Weakness x 2 days with increasing tremors. 4 person assist now - lives alone. Diabetic non complaint with meds. 93% RA.

## 2022-06-17 NOTE — Assessment & Plan Note (Signed)
Likely pseudohyponatremia from hyperglycemia Continue to monitor

## 2022-06-17 NOTE — ED Provider Notes (Signed)
[facility] Provider Note   Event Date/Time   First MD Initiated Contact with Patient 06/17/22 2056     (approximate) History  Weakness  HPI James [name] is a 62 y.o. male with a past medical history of morbid obesity, poorly controlled diabetes, and hypertension who presents complaining of generalized weakness and increased urination over the past week.  Patient denies medication adherence to his diabetes medicines.  Patient denies any recent travel or sick contacts.  Patient states that he is also concerned about a rash that he has in his inguinal region due to not being able to get up to go urinate.  Patient states that he has been urinating and cups and they have been spilling around his groin area. ROS: Patient currently denies any vision changes, tinnitus, difficulty speaking, facial droop, sore throat, chest pain, shortness of breath, abdominal pain, nausea/vomiting/diarrhea, dysuria, or numbness/paresthesias in any extremity   Physical Exam  Triage Vital Signs: ED Triage Vitals  Enc Vitals Group     BP 06/17/22 2030 (!) 115/92     Pulse --      Resp --      Temp --      Temp src --      SpO2 06/17/22 2030 (!) 84 %     Weight --      Height --      Head Circumference --      Peak Flow --      Pain Score 06/17/22 2014 0     Pain Loc --      Pain Edu? --      Excl. in GC? --    Most recent vital signs: Vitals:   06/17/22 2030  BP: (!) 115/92  SpO2: (!) 84%   General: Awake, oriented x4. CV:  Good peripheral perfusion.  Resp:  Normal effort.  Abd:  No distention.  Other:  Morbidly obese middle-aged African-American male laying in bed no acute distress ED Results / Procedures / Treatments  Labs (all labs ordered are listed, but only abnormal results are displayed) Labs Reviewed  URINALYSIS, ROUTINE W REFLEX MICROSCOPIC  CBC WITH DIFFERENTIAL/PLATELET  COMPREHENSIVE METABOLIC PANEL  CBG MONITORING, ED  TROPONIN I (HIGH SENSITIVITY)    EKG ED ECG REPORT I, Merwyn Katos, the attending physician, personally viewed and interpreted this ECG. Date: 06/17/2022 EKG Time: 2059 Rate: 78 Rhythm: normal sinus rhythm QRS Axis: normal Intervals: normal ST/T Wave abnormalities: normal Narrative Interpretation: no evidence of acute ischemia PROCEDURES: Critical Care performed: Yes, see critical care procedure note(s) .1-3 Lead EKG Interpretation  Performed by: Merwyn Katos, MD Authorized by: Merwyn Katos, MD     Interpretation: normal     ECG rate:  71   ECG rate assessment: normal     Rhythm: sinus rhythm     Ectopy: none     Conduction: normal   CRITICAL CARE Performed by: Merwyn Katos  Total critical care time: 33 minutes  Critical care time was exclusive of separately billable procedures and treating other patients.  Critical care was necessary to treat or prevent imminent or life-threatening deterioration.  Critical care was time spent personally by me on the following activities: development of treatment plan with patient and/or surrogate as well as nursing, discussions with consultants, evaluation of patient's response to treatment, examination of patient, obtaining history from patient or surrogate, ordering and performing treatments and interventions, ordering and review of laboratory studies, ordering and review of radiographic studies,  pulse oximetry and re-evaluation of patient's condition.  MEDICATIONS ORDERED IN ED: Medications - No data to display IMPRESSION / MDM / ASSESSMENT AND PLAN / ED COURSE  I reviewed the triage vital signs and the nursing notes.                             *The patient is on the cardiac monitor to evaluate for evidence of arrhythmia and/or significant heart rate changes. Patient's presentation is most consistent with acute presentation with potential threat to life or bodily function. Patients presentation most consistent with hyperglycemic state WITH evidence of  HHS. Given Exam, History, and Workup I have low suspicion for an emergent precipitating factor of this hyperglycemic state such as atypical MI, acute abdomen, or other serious bacterial illness. 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 pH: 7.33 Potassium: 4.6  Patient started on continuous fluid boluses of LR, insulin drip, potassium chloride supplemented as needed given potassium level at each BMP Reassessment: Mental status improving Dispo: Admit  Clinical Course as of 06/17/22 2316  Fri Jun 17, 2022  2314 EKG 12-Lead [EB]    Clinical Course User Index [EB] Merwyn Katos, MD   FINAL CLINICAL IMPRESSION(S) / ED DIAGNOSES   Final diagnoses:  None   Rx / DC Orders   ED Discharge Orders     None      Note:  This document was prepared using Dragon voice recognition software and may include unintentional dictation errors.   Merwyn Katos, MD 06/17/22 [phone]

## 2022-06-17 NOTE — Progress Notes (Signed)
PHARMACIST - PHYSICIAN COMMUNICATION  CONCERNING:  Enoxaparin (Lovenox) for DVT Prophylaxis    RECOMMENDATION: Patient was prescribed enoxaprin 40mg  q24 hours for VTE prophylaxis.   Filed Weights   06/17/22 2300  Weight: (!) 182.3 kg (402 lb)    Body mass index is 54.52 kg/m.  Estimated Creatinine Clearance: 132.8 mL/min (by C-G formula based on SCr of 1 mg/dL).   Based on [facility] policy patient is candidate for enoxaparin 0.5mg /kg TBW SQ every 24 hours based on BMI being >30.  DESCRIPTION: Pharmacy has adjusted enoxaparin dose per [facility] policy.  Patient is now receiving enoxaparin 0.5 mg/kg every 24 hours   Otelia Sergeant, PharmD, [facility] 06/17/2022 11:45 PM

## 2022-06-17 NOTE — H&P (Signed)
History and Physical    Patient: James [name] WUJ:[id number] DOB: Dec 27, 1960 DOA: 06/17/2022 DOS: the patient was seen and examined on 06/17/2022 PCP: Smitty Cords, DO  Patient coming from: Home  Chief Complaint:  Chief Complaint  Patient presents with   Weakness    HPI: Josehua Litalien is a 62 y.o. 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 who was brought to the ED with a 2-day complaint of protracted weakness, shakiness and nonadherence with medication due to inability to perform ADLs, IADLs and help himself in any capacity at home where he lives alone.  Patient was declined ESI at his last pain clinic appointment on 5/8 due to concern for worsening weight gain and lately, due to his limitations, has been unable to help himself and get out of bed . He denies nausea, vomiting, fever or chills and denies abdominal pain or diarrhea.  He has no cough or shortness of breath or chest pain.  He denies visual disturbance or one-sided weakness numbness or tingling. He complains of increased severity of his usual musculoskeletal pain. ED course and data review: Vitals within normal limits.  Labs notable for glucose of 760 with normal anion gap, normal bicarb.  Sodium 122.  Beta hydroxybutyric acid 0.27 and venous pH 7.33.  pCO2 on VBG noted to be high at 72.  Urinalysis unremarkable.  WBC 12,000 with normal hemoglobin. EKG, personally reviewed and interpreted showing sinus at 78 with no concerning ST-T wave changes. Patient was given at 3 L NS bolus and started on an insulin infusion and given potassium repletion per Endo tool. Hospitalist consulted for admission.   Review of Systems: As mentioned in the history of present illness. All other systems reviewed and are negative.  Past  Medical History:  Diagnosis Date   Arthritis    Diabetes mellitus without complication (HCC)    Hyperlipidemia    Hypertension    Past Surgical History:  Procedure Laterality Date   APPENDECTOMY     COLONOSCOPY WITH PROPOFOL     COLONOSCOPY WITH PROPOFOL N/A 04/23/2021   Procedure: COLONOSCOPY WITH PROPOFOL;  Surgeon: Wyline Mood, MD;  Location: [facility] ENDOSCOPY;  Service: Gastroenterology;  Laterality: N/A;   Social History:  reports that he has been smoking cigarettes. He has a 2.50 pack-year smoking history. He has never used smokeless tobacco. He reports that he does not drink alcohol and does not use drugs.  No Known Allergies  Family History  Problem Relation Age of Onset   Heart disease Mother    Arthritis Mother    Arthritis Father     Prior to Admission medications   Medication Sig Start Date End Date Taking? Authorizing Provider  albuterol (VENTOLIN HFA) 108 (90 Base) MCG/ACT inhaler INHALE 1 TO 2 PUFFS BY MOUTH EVERY 6 HOURS AS NEEDED FOR WHEEZING FOR SHORTNESS OF BREATH 05/04/22  Yes Karamalegos, Netta Neat, DO  atorvastatin (LIPITOR) 40 MG tablet Take 1 tablet (40 mg total) by mouth daily. 06/25/21  Yes Karamalegos, Netta Neat, DO  fluticasone-salmeterol (ADVAIR DISKUS) 250-50 MCG/ACT AEPB Inhale 1 puff into the lungs in the morning and at bedtime. 05/04/22  Yes Karamalegos, Netta Neat, DO  furosemide (LASIX) 20 MG tablet TAKE 1 TABLET BY MOUTH ONCE DAILY AS NEEDED FOR FLUID OR EDEMA 03/15/22  Yes Karamalegos, Netta Neat, DO  hydrochlorothiazide (HYDRODIURIL)  25 MG tablet Take 1 tablet (25 mg total) by mouth daily. 06/25/21  Yes Karamalegos, Netta Neat, DO  losartan (COZAAR) 25 MG tablet Take 1 tablet (25 mg total) by mouth daily. 06/25/21  Yes Karamalegos, Netta Neat, DO  metFORMIN (GLUCOPHAGE) 1000 MG tablet TAKE 1 TABLET BY MOUTH WITH BREAKFAST 03/28/22  Yes Karamalegos, Netta Neat, DO  Oxycodone HCl 10 MG TABS Take 1 tablet (10 mg total) by mouth in the morning and at  bedtime. Must last 30 days. 05/31/22 06/30/22 Yes Delano Metz, MD  OZEMPIC, 1 MG/DOSE, 4 MG/3ML SOPN INJECT 1MG  INTO THE SKIN ONCE A WEEK 05/20/22  Yes Karamalegos, Netta Neat, DO  pantoprazole (PROTONIX) 40 MG tablet Take 40 mg by mouth daily.   Yes [provider]  pregabalin (LYRICA) 150 MG capsule TAKE 2 CAPSULES BY MOUTH IN THE MORNING AND 1 AT NIGHT 01/06/22  Yes Karamalegos, Alexander J, DO  albuterol (PROVENTIL) (2.5 MG/3ML) 0.083% nebulizer solution Take 3mL (2.5mg ) by nebulization every 6 hours and as needed for shortness of breath and wheezing. Patient not taking: Reported on 06/17/2022 02/22/22   Smitty Cords, DO  Blood Pressure Monitoring DEVI 1 Device by Does not apply route once a week. Patient not taking: Reported on 06/17/2022 05/19/22   Smitty Cords, DO  guaiFENesin-codeine (ROBITUSSIN AC) 100-10 MG/5ML syrup Take 5-10 mLs by mouth 4 (four) times daily as needed for cough. Patient not taking: Reported on 06/17/2022 05/04/22   Smitty Cords, DO  naloxone [facility]) nasal spray 4 mg/0.1 mL Place 1 spray into the nose as needed for up to 365 doses (for opioid-induced respiratory depresssion). In case of emergency (overdose), spray once into each nostril. If no response within 3 minutes, repeat application and call 911. Patient not taking: Reported on 06/17/2022 11/15/21 11/15/22  Delano Metz, MD  Oxycodone HCl 10 MG TABS Take 1 tablet (10 mg total) by mouth in the morning and at bedtime. Must last 30 days. 05/01/22 05/31/22  Delano Metz, MD  Oxycodone HCl 10 MG TABS Take 1 tablet (10 mg total) by mouth in the morning and at bedtime. Must last 30 days. 06/30/22 07/30/22  Delano Metz, MD  Oxycodone HCl 10 MG TABS Take 1 tablet (10 mg total) by mouth in the morning and at bedtime. Must last 30 days. 07/30/22 08/29/22  Delano Metz, MD  varenicline (CHANTIX CONTINUING MONTH PAK) 1 MG tablet TK 1 T PO BID Patient not taking: Reported  on 06/17/2022    [provider]    Physical Exam: Vitals:   06/17/22 2030 06/17/22 2145  BP: (!) 115/92 (!) 139/110  Pulse:  79  Resp:  13  SpO2:  94%   Physical Exam Vitals and nursing note reviewed.  Constitutional:      General: He is not in acute distress. HENT:     Head: Normocephalic and atraumatic.  Cardiovascular:     Rate and Rhythm: Normal rate and regular rhythm.     Heart sounds: Normal heart sounds.  Pulmonary:     Effort: Pulmonary effort is normal.     Breath sounds: Normal breath sounds.  Abdominal:     Palpations: Abdomen is soft.     Tenderness: There is no abdominal tenderness.  Skin:    Comments: See pic below  Neurological:     Mental Status: Mental status is at baseline.     Labs on Admission: I have personally reviewed following labs and imaging studies  CBC: Recent Labs  Lab  06/17/22 2109  WBC 12.3*  NEUTROABS 9.6*  HGB 16.5  HCT 50.1  MCV 87.7  PLT 294   Basic Metabolic Panel: Recent Labs  Lab 06/17/22 2109  NA 122*  K 4.6  CL 80*  CO2 29  GLUCOSE 760*  BUN 9  CREATININE 1.00  CALCIUM 8.7*   GFR: CrCl cannot be calculated (Unknown ideal weight.). Liver Function Tests: Recent Labs  Lab 06/17/22 2109  AST 14*  ALT 9  ALKPHOS 135*  BILITOT 1.1  PROT 6.8  ALBUMIN 3.9   No results for input(s): "LIPASE", "AMYLASE" in the last 168 hours. No results for input(s): "AMMONIA" in the last 168 hours. Coagulation Profile: No results for input(s): "INR", "PROTIME" in the last 168 hours. Cardiac Enzymes: No results for input(s): "CKTOTAL", "CKMB", "CKMBINDEX", "TROPONINI" in the last 168 hours. BNP (last 3 results) No results for input(s): "PROBNP" in the last 8760 hours. HbA1C: No results for input(s): "HGBA1C" in the last 72 hours. CBG: Recent Labs  Lab 06/17/22 2241  GLUCAP >600*   Lipid Profile: No results for input(s): "CHOL", "HDL", "LDLCALC", "TRIG", "CHOLHDL", "LDLDIRECT" in the last 72 hours. Thyroid  Function Tests: No results for input(s): "TSH", "T4TOTAL", "FREET4", "T3FREE", "THYROIDAB" in the last 72 hours. Anemia Panel: No results for input(s): "VITAMINB12", "FOLATE", "FERRITIN", "TIBC", "IRON", "RETICCTPCT" in the last 72 hours. Urine analysis:    Component Value Date/Time   COLORURINE STRAW (A) 06/17/2022 2109   APPEARANCEUR CLEAR (A) 06/17/2022 2109   LABSPEC 1.026 06/17/2022 2109   PHURINE 6.0 06/17/2022 2109   GLUCOSEU >=500 (A) 06/17/2022 2109   HGBUR SMALL (A) 06/17/2022 2109   BILIRUBINUR NEGATIVE 06/17/2022 2109   KETONESUR NEGATIVE 06/17/2022 2109   PROTEINUR NEGATIVE 06/17/2022 2109   NITRITE NEGATIVE 06/17/2022 2109   LEUKOCYTESUR SMALL (A) 06/17/2022 2109    Radiological Exams on Admission: No results found.   Data Reviewed: Relevant notes from primary care and specialist visits, past discharge summaries as available in EHR, including Care Everywhere. Prior diagnostic testing as pertinent to current admission diagnoses Updated medications and problem lists for reconciliation ED course, including vitals, labs, imaging, treatment and response to treatment Triage notes, nursing and pharmacy notes and ED provider's notes Notable results as noted in HPI   Assessment and Plan: * Hyperosmolar hyperglycemic state (HHS) (HCC) Continue IV fluids Continue insulin infusion per Endo tool and will transition when blood sugars more controlled. Patient previously on Ozempic and metformin Follow A1c.  Last A1c was 6 in January 2024  Cellulitis Candidal intertrigo with secondary bacterial infection IV fluconazole 200 mg x 1 dose and 100 mg daily Ceftriaxone daily Topical nystatin Wound care consult  Hypercapnia Mild intermittent asthma VBG with pCO2 of 72.  O2 sats on room air 92-93 Possibly underlying OSA/OHS DuoNebs as needed Might benefit from sleep study As needed oxygen , might benefit from CPAP  Hyponatremia Likely pseudohyponatremia from  hyperglycemia Continue to monitor  Class 3 severe obesity with serious comorbidity and body mass index (BMI) of 50.0 to 59.9 in adult [facility]) Complicating factor to overall prognosis and care  Chronic pain syndrome Chronic continuous use of opiates History of epidural steroid injections Multimodal pain control with home oxycodone and Lyrica Toradol for breakthrough with Dilaudid as needed Physical therapy evaluation  Generalized weakness Related to HHS and chronic physical deconditioning PT consult    DVT prophylaxis: Lovenox  Consults: none  Advance Care Planning: dull code  Family Communication: none  Disposition Plan: Back to previous home environment  Severity of Illness: The appropriate patient status for this patient is INPATIENT. Inpatient status is judged to be reasonable and necessary in order to provide the required intensity of service to ensure the patient's safety. The patient's presenting symptoms, physical exam findings, and initial radiographic and laboratory data in the context of their chronic comorbidities is felt to place them at high risk for further clinical deterioration. Furthermore, it is not anticipated that the patient will be medically stable for discharge from the hospital within 2 midnights of admission.   * I certify that at the point of admission it is my clinical judgment that the patient will require inpatient hospital care spanning beyond 2 midnights from the point of admission due to high intensity of service, high risk for further deterioration and high frequency of surveillance required.*  Author: Andris Baumann, MD 06/17/2022 10:54 PM  For on call review [email or web address].

## 2022-06-17 NOTE — Assessment & Plan Note (Addendum)
Mild intermittent asthma VBG with pCO2 of 72.  O2 sats on room air 92-93 Possibly underlying OSA/OHS DuoNebs as needed Might benefit from sleep study As needed oxygen , might benefit from CPAP

## 2022-06-17 NOTE — Assessment & Plan Note (Addendum)
Continue IV fluids Continue insulin infusion per Endo tool and will transition when blood sugars more controlled. Patient previously on Ozempic and metformin Follow A1c.  Last A1c was 6 in January 2024

## 2022-06-17 NOTE — Assessment & Plan Note (Signed)
Complicating factor to overall prognosis and care 

## 2022-06-18 ENCOUNTER — Other Ambulatory Visit: Payer: Self-pay

## 2022-06-18 DIAGNOSIS — B372 Candidiasis of skin and nail: Secondary | ICD-10-CM

## 2022-06-18 DIAGNOSIS — E1100 Type 2 diabetes mellitus with hyperosmolarity without nonketotic hyperglycemic-hyperosmolar coma (NKHHC): Secondary | ICD-10-CM | POA: Diagnosis not present

## 2022-06-18 DIAGNOSIS — L0390 Cellulitis, unspecified: Secondary | ICD-10-CM

## 2022-06-18 LAB — GLUCOSE, CAPILLARY
Glucose-Capillary: 275 mg/dL — ABNORMAL HIGH (ref 70–99)
Glucose-Capillary: 288 mg/dL — ABNORMAL HIGH (ref 70–99)
Glucose-Capillary: 381 mg/dL — ABNORMAL HIGH (ref 70–99)

## 2022-06-18 LAB — BASIC METABOLIC PANEL
Anion gap: 12 (ref 5–15)
Anion gap: 19 — ABNORMAL HIGH (ref 5–15)
Anion gap: 9 (ref 5–15)
BUN: 8 mg/dL (ref 6–20)
BUN: 8 mg/dL (ref 6–20)
BUN: 9 mg/dL (ref 6–20)
CO2: 25 mmol/L (ref 22–32)
CO2: 29 mmol/L (ref 22–32)
CO2: 30 mmol/L (ref 22–32)
Calcium: 8.8 mg/dL — ABNORMAL LOW (ref 8.9–10.3)
Calcium: 8.8 mg/dL — ABNORMAL LOW (ref 8.9–10.3)
Calcium: 8.9 mg/dL (ref 8.9–10.3)
Chloride: 85 mmol/L — ABNORMAL LOW (ref 98–111)
Chloride: 89 mmol/L — ABNORMAL LOW (ref 98–111)
Chloride: 93 mmol/L — ABNORMAL LOW (ref 98–111)
Creatinine, Ser: 0.76 mg/dL (ref 0.61–1.24)
Creatinine, Ser: 0.85 mg/dL (ref 0.61–1.24)
Creatinine, Ser: 1.05 mg/dL (ref 0.61–1.24)
GFR, Estimated: >60 mL/min (ref >60)
GFR, Estimated: >60 mL/min (ref >60)
GFR, Estimated: >60 mL/min (ref >60)
Glucose, Bld: 253 mg/dL — ABNORMAL HIGH (ref 70–99)
Glucose, Bld: 381 mg/dL — ABNORMAL HIGH (ref 70–99)
Glucose, Bld: 470 mg/dL — ABNORMAL HIGH (ref 70–99)
Potassium: 3.4 mmol/L — ABNORMAL LOW (ref 3.5–5.1)
Potassium: 4.3 mmol/L (ref 3.5–5.1)
Potassium: 4.4 mmol/L (ref 3.5–5.1)
Sodium: 129 mmol/L — ABNORMAL LOW (ref 135–145)
Sodium: 131 mmol/L — ABNORMAL LOW (ref 135–145)
Sodium: 131 mmol/L — ABNORMAL LOW (ref 135–145)

## 2022-06-18 LAB — CBG MONITORING, ED
Glucose-Capillary: 520 mg/dL (ref 70–99)
Glucose-Capillary: 491 mg/dL — ABNORMAL HIGH (ref 70–99)
Glucose-Capillary: 392 mg/dL — ABNORMAL HIGH (ref 70–99)
Glucose-Capillary: 421 mg/dL — ABNORMAL HIGH (ref 70–99)
Glucose-Capillary: 588 mg/dL (ref 70–99)
Glucose-Capillary: 475 mg/dL — ABNORMAL HIGH (ref 70–99)
Glucose-Capillary: 136 mg/dL — ABNORMAL HIGH (ref 70–99)
Glucose-Capillary: 141 mg/dL — ABNORMAL HIGH (ref 70–99)
Glucose-Capillary: 158 mg/dL — ABNORMAL HIGH (ref 70–99)
Glucose-Capillary: 272 mg/dL — ABNORMAL HIGH (ref 70–99)

## 2022-06-18 LAB — BETA-HYDROXYBUTYRIC ACID
Beta-Hydroxybutyric Acid: 0.81 mmol/L — ABNORMAL HIGH (ref 0.05–0.27)
Beta-Hydroxybutyric Acid: 0.52 mmol/L — ABNORMAL HIGH (ref 0.05–0.27)
Beta-Hydroxybutyric Acid: 0.11 mmol/L (ref 0.05–0.27)

## 2022-06-18 MED ORDER — ALBUTEROL SULFATE (2.5 MG/3ML) 0.083% IN NEBU: 3.0000 mL | INHALATION_SOLUTION | RESPIRATORY_TRACT | Status: DC | PRN

## 2022-06-18 MED ORDER — INSULIN GLARGINE-YFGN 100 UNIT/ML ~~LOC~~ SOLN
30.0000 [IU] | Freq: Every day | SUBCUTANEOUS | Status: DC
  Filled 2022-06-18: qty 0.3

## 2022-06-18 MED ORDER — DEXTROSE 50 % IV SOLN: 0.0000 mL | INTRAVENOUS | Status: DC | PRN

## 2022-06-18 MED ORDER — LACTATED RINGERS IV SOLN: INTRAVENOUS | Status: DC

## 2022-06-18 MED ORDER — INSULIN GLARGINE-YFGN 100 UNIT/ML ~~LOC~~ SOLN
15.0000 [IU] | SUBCUTANEOUS | Status: DC
  Filled 2022-06-18: qty 0.15

## 2022-06-18 MED ORDER — FLUCONAZOLE IN SODIUM CHLORIDE 200-0.9 MG/100ML-% IV SOLN
200.0000 mg | INTRAVENOUS | Status: DC
  Administered 2022-06-18: 200 mg via INTRAVENOUS
  Filled 2022-06-18: qty 100

## 2022-06-18 MED ORDER — INSULIN REGULAR(HUMAN) IN NACL 100-0.9 UT/100ML-% IV SOLN
INTRAVENOUS | Status: DC
  Administered 2022-06-18: 15 [IU]/h via INTRAVENOUS
  Filled 2022-06-18: qty 100

## 2022-06-18 MED ORDER — INSULIN GLARGINE-YFGN 100 UNIT/ML ~~LOC~~ SOLN
10.0000 [IU] | Freq: Once | SUBCUTANEOUS | Status: AC
  Administered 2022-06-18: 10 [IU] via SUBCUTANEOUS
  Filled 2022-06-18: qty 0.1

## 2022-06-18 MED ORDER — INSULIN GLARGINE-YFGN 100 UNIT/ML ~~LOC~~ SOLN
15.0000 [IU] | Freq: Every day | SUBCUTANEOUS | Status: DC
  Administered 2022-06-18: 15 [IU] via SUBCUTANEOUS
  Filled 2022-06-18 (×2): qty 0.15

## 2022-06-18 MED ORDER — LACTATED RINGERS IV BOLUS: 20.0000 mL/kg | Freq: Once | INTRAVENOUS | Status: DC

## 2022-06-18 MED ORDER — DEXTROSE IN LACTATED RINGERS 5 % IV SOLN: INTRAVENOUS | Status: DC

## 2022-06-18 MED ORDER — INSULIN GLARGINE-YFGN 100 UNIT/ML ~~LOC~~ SOLN
15.0000 [IU] | Freq: Two times a day (BID) | SUBCUTANEOUS | Status: DC
  Filled 2022-06-18: qty 0.15

## 2022-06-18 MED ORDER — POTASSIUM CHLORIDE 10 MEQ/100ML IV SOLN
10.0000 meq | INTRAVENOUS | Status: AC
  Administered 2022-06-18 (×2): 10 meq via INTRAVENOUS
  Filled 2022-06-18 (×2): qty 100

## 2022-06-18 MED ORDER — INSULIN GLARGINE-YFGN 100 UNIT/ML ~~LOC~~ SOLN
25.0000 [IU] | Freq: Every day | SUBCUTANEOUS | Status: DC
Start: 2022-06-18 — End: 2022-06-18

## 2022-06-18 MED ORDER — INSULIN ASPART 100 UNIT/ML IJ SOLN: 0.0000 [IU] | Freq: Every day | INTRAMUSCULAR | Status: DC

## 2022-06-18 MED ORDER — INSULIN ASPART 100 UNIT/ML IJ SOLN
10.0000 [IU] | Freq: Once | INTRAMUSCULAR | Status: DC
  Filled 2022-06-18: qty 0.1

## 2022-06-18 MED ORDER — SODIUM CHLORIDE 0.9 % IV SOLN
1.0000 g | INTRAVENOUS | Status: DC
  Administered 2022-06-18 – 2022-06-21 (×4): 1 g via INTRAVENOUS
  Filled 2022-06-18 (×4): qty 10

## 2022-06-18 MED ORDER — OXYCODONE HCL 5 MG PO TABS
10.0000 mg | ORAL_TABLET | Freq: Two times a day (BID) | ORAL | Status: DC
  Administered 2022-06-18 – 2022-06-21 (×7): 10 mg via ORAL
  Filled 2022-06-18 (×7): qty 2

## 2022-06-18 MED ORDER — PANTOPRAZOLE SODIUM 40 MG PO TBEC
40.0000 mg | DELAYED_RELEASE_TABLET | Freq: Every day | ORAL | Status: DC
  Administered 2022-06-18 – 2022-06-21 (×4): 40 mg via ORAL
  Filled 2022-06-18 (×4): qty 1

## 2022-06-18 MED ORDER — POTASSIUM CHLORIDE CRYS ER 20 MEQ PO TBCR
40.0000 meq | EXTENDED_RELEASE_TABLET | Freq: Once | ORAL | Status: AC
  Administered 2022-06-18: 40 meq via ORAL
  Filled 2022-06-18: qty 2

## 2022-06-18 MED ORDER — FLUCONAZOLE 100MG IVPB
100.0000 mg | INTRAVENOUS | Status: DC
  Administered 2022-06-19 – 2022-06-20 (×2): 100 mg via INTRAVENOUS
  Filled 2022-06-18 (×2): qty 50

## 2022-06-18 MED ORDER — INSULIN ASPART 100 UNIT/ML IJ SOLN: 0.0000 [IU] | Freq: Three times a day (TID) | INTRAMUSCULAR | Status: DC

## 2022-06-18 MED ORDER — INSULIN ASPART 100 UNIT/ML IJ SOLN
0.0000 [IU] | INTRAMUSCULAR | Status: DC
  Administered 2022-06-18 (×2): 8 [IU] via SUBCUTANEOUS
  Administered 2022-06-18: 15 [IU] via SUBCUTANEOUS
  Administered 2022-06-18: 8 [IU] via SUBCUTANEOUS
  Administered 2022-06-19: 3 [IU] via SUBCUTANEOUS
  Administered 2022-06-19: 5 [IU] via SUBCUTANEOUS
  Administered 2022-06-19 (×3): 11 [IU] via SUBCUTANEOUS
  Administered 2022-06-19: 8 [IU] via SUBCUTANEOUS
  Administered 2022-06-20: 15 [IU] via SUBCUTANEOUS
  Administered 2022-06-20: 8 [IU] via SUBCUTANEOUS
  Administered 2022-06-20: 3 [IU] via SUBCUTANEOUS
  Administered 2022-06-20: 5 [IU] via SUBCUTANEOUS
  Administered 2022-06-20: 15 [IU] via SUBCUTANEOUS
  Administered 2022-06-21: 2 [IU] via SUBCUTANEOUS
  Administered 2022-06-21: 5 [IU] via SUBCUTANEOUS
  Administered 2022-06-21: 3 [IU] via SUBCUTANEOUS
  Filled 2022-06-18 (×17): qty 1

## 2022-06-18 MED ORDER — BARRIER CREAM NON-SPECIFIED: 1.0000 | TOPICAL_CREAM | Freq: Two times a day (BID) | TOPICAL | Status: DC | PRN

## 2022-06-18 MED ORDER — INSULIN ASPART 100 UNIT/ML IV SOLN
10.0000 [IU] | Freq: Once | INTRAVENOUS | Status: DC
  Filled 2022-06-18: qty 0.1

## 2022-06-18 MED ORDER — FLUTICASONE FUROATE-VILANTEROL 200-25 MCG/ACT IN AEPB
1.0000 | INHALATION_SPRAY | Freq: Every day | RESPIRATORY_TRACT | Status: DC
  Administered 2022-06-18 – 2022-06-21 (×4): 1 via RESPIRATORY_TRACT
  Filled 2022-06-18: qty 28

## 2022-06-18 NOTE — ED Notes (Signed)
Redraw BGL as it seemed inaccurate on different finger. Utilized lowest BGL to titrate insulin off

## 2022-06-18 NOTE — Progress Notes (Signed)
PROGRESS NOTE    James [name]  GNF:[id number] DOB: 04-28-1960 DOA: 06/17/2022 PCP: Smitty Cords, DO    Brief Narrative:  62 y.o. 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 who was brought to the ED with a 2-day complaint of protracted weakness, shakiness and nonadherence with medication due to inability to perform ADLs, IADLs and help himself in any capacity at home where he lives alone.  Patient was declined ESI at his last pain clinic appointment on 5/8 due to concern for worsening weight gain and lately, due to his limitations, has been unable to help himself and get out of bed . He denies nausea, vomiting, fever or chills and denies abdominal pain or diarrhea.  He has no cough or shortness of breath or chest pain.  He denies visual disturbance or one-sided weakness numbness or tingling. He complains of increased severity of his usual musculoskeletal pain. ED course and data review: Vitals within normal limits.  Labs notable for glucose of 760 with normal anion gap, normal bicarb.  Sodium 122.  Beta hydroxybutyric acid 0.27 and venous pH 7.33.  pCO2 on VBG noted to be high at 72.  Urinalysis unremarkable.  WBC 12,000 with normal hemoglobin. EKG, personally reviewed and interpreted showing sinus at 78 with no concerning ST-T wave changes. Patient was given at 3 L NS bolus and started on an insulin infusion and given potassium repletion per Endo tool. Hospitalist consulted for admission.    Assessment & Plan:   Principal Problem:   Hyperosmolar hyperglycemic state (HHS) (HCC) Active Problems:   Cellulitis   Candidal intertrigo   Chronic pain syndrome   Class 3 severe obesity with serious comorbidity and body mass index (BMI) of 50.0 to 59.9 in adult [facility])   Chronic,  continuous use of opioids   Hyponatremia   Hypercapnia   DDD (degenerative disc disease), lumbar   Chronic use of opiate for therapeutic purpose   Chronic arthropathy   Mild persistent asthma without complication   Generalized weakness   Hyperosmolar hyperglycemic state (HHS) (HCC) Glycemic control improved.  Patient did have briefly have a elevated anion gap.  This is now closed.  Sugars have improved. Plan: Patient is mentating clearly.  Will transition to medical telemetry.  Subcutaneous insulin regimen ordered.  Diabetes coordinator consult appreciated.  Carb modified diet.   Cellulitis Candidal intertrigo with secondary bacterial infection IV fluconazole 200 mg x 1 dose and 100 mg daily Ceftriaxone daily Topical nystatin Wound care consult   Hypercapnia Mild intermittent asthma VBG with pCO2 of 72.  O2 sats on room air 92-93 Suspect underlying OSA/OHS DuoNebs as needed Might benefit from sleep study as outpatient As needed oxygen , might benefit from CPAP   Hyponatremia Likely depressed in the setting of hyperglycemia   Class 3 severe obesity with serious comorbidity and body mass index (BMI) of 50.0 to 59.9 in adult [facility]) Complicating factor to overall prognosis and care   Chronic pain syndrome Chronic continuous use of opiates History of epidural steroid injections Multimodal pain control with home oxycodone and Lyrica Toradol for breakthrough with Dilaudid as needed Physical therapy evaluation   Generalized weakness Related to HHS and chronic physical deconditioning PT consult   DVT prophylaxis: SQ Lovenox Code Status: Full Family Communication: None Disposition Plan: Status is: Inpatient Remains inpatient appropriate because: Severe  hyperglycemia   Level of care: Telemetry Medical  Consultants:  None  Procedures:  None  Antimicrobials: Ceftriaxone Fluconazole  Subjective: seen and examined.  Resting in bed.  Endorses hunger and  thirst.   Objective: Vitals:   06/18/22 1230 06/18/22 1300 06/18/22 1404 06/18/22 1425  BP: (!) 96/58 94/84  100/74  Pulse: 72 77  72  Resp: 13 18  15   Temp:  98.3 F (36.8 C)  98.3 F (36.8 C)  TempSrc:  Oral    SpO2: 93% 95%  (!) 82%  Weight:      Height:   6' (1.829 m)     Intake/Output Summary (Last 24 hours) at 06/18/2022 1452 Last data filed at 06/18/2022 0918 Gross per 24 hour  Intake 100 ml  Output --  Net 100 ml   Filed Weights   06/17/22 2300  Weight: (!) 182.3 kg    Examination:  General exam: NAD.  Appears chronically ill Respiratory system: Clear to auscultation. Respiratory effort normal. Cardiovascular system: S1-S2, RRR, no murmurs, no pedal edema Gastrointestinal system: Obese, NT/ND, bowel sounds Central nervous system: Alert and oriented. No focal neurological deficits. Extremities: Decreased proximal Skin: Inguinal rash Psychiatry: Judgement and insight appear normal. Mood & affect appropriate.     Data Reviewed: I have personally reviewed following labs and imaging studies  CBC: Recent Labs  Lab 06/17/22 2109  WBC 12.3*  NEUTROABS 9.6*  HGB 16.5  HCT 50.1  MCV 87.7  PLT 294   Basic Metabolic Panel: Recent Labs  Lab 06/17/22 2109 06/17/22 2345 06/18/22 0601 06/18/22 1058  NA 122* 129* 131* 131*  K 4.6 3.4* 4.3 4.4  CL 80* 85* 89* 93*  CO2 29 25 30 29   GLUCOSE 760* 470* 381* 253*  BUN 9 9 8 8   CREATININE 1.00 1.05 0.76 0.85  CALCIUM 8.7* 8.9 8.8* 8.8*   GFR: Estimated Creatinine Clearance: 156.2 mL/min (by C-G formula based on SCr of 0.85 mg/dL). Liver Function Tests: Recent Labs  Lab 06/17/22 2109  AST 14*  ALT 9  ALKPHOS 135*  BILITOT 1.1  PROT 6.8  ALBUMIN 3.9   No results for input(s): "LIPASE", "AMYLASE" in the last 168 hours. No results for input(s): "AMMONIA" in the last 168 hours. Coagulation Profile: No results for input(s): "INR", "PROTIME" in the last 168 hours. Cardiac Enzymes: No results for  input(s): "CKTOTAL", "CKMB", "CKMBINDEX", "TROPONINI" in the last 168 hours. BNP (last 3 results) No results for input(s): "PROBNP" in the last 8760 hours. HbA1C: No results for input(s): "HGBA1C" in the last 72 hours. CBG: Recent Labs  Lab 06/18/22 0705 06/18/22 0804 06/18/22 0805 06/18/22 0918 06/18/22 1145  GLUCAP 475* 141* 136* 158* 272*   Lipid Profile: No results for input(s): "CHOL", "HDL", "LDLCALC", "TRIG", "CHOLHDL", "LDLDIRECT" in the last 72 hours. Thyroid Function Tests: No results for input(s): "TSH", "T4TOTAL", "FREET4", "T3FREE", "THYROIDAB" in the last 72 hours. Anemia Panel: No results for input(s): "VITAMINB12", "FOLATE", "FERRITIN", "TIBC", "IRON", "RETICCTPCT" in the last 72 hours. Sepsis Labs: No results for input(s): "PROCALCITON", "LATICACIDVEN" in the last 168 hours.  No results found for this or any previous visit (from the past 240 hour(s)).       Radiology Studies: No results found.      Scheduled Meds:  atorvastatin  40 mg Oral Daily   enoxaparin (LOVENOX) injection  90 mg Subcutaneous Q24H   fluticasone furoate-vilanterol  1 puff Inhalation Daily   insulin aspart  0-15 Units Subcutaneous Q4H   insulin glargine-yfgn  15 Units Subcutaneous Daily   losartan  25 mg Oral Daily   nystatin   Topical TID   oxyCODONE  10 mg Oral BID   pantoprazole  40 mg Oral QAC breakfast   pregabalin  150 mg Oral BID   Continuous Infusions:  cefTRIAXone (ROCEPHIN)  IV Stopped (06/18/22 0440)   dextrose 5% lactated ringers Stopped (06/18/22 1156)   [START ON 06/19/2022] fluconazole (DIFLUCAN) IV     lactated ringers 125 mL/hr at 06/18/22 1159     LOS: 1 day     Tresa Moore, MD [facility]   If 7PM-7AM, please contact night-coverage  06/18/2022, 2:52 PM

## 2022-06-18 NOTE — ED Notes (Signed)
Advised nurse that patient has ready bed 

## 2022-06-18 NOTE — Assessment & Plan Note (Signed)
Candidal intertrigo with secondary bacterial infection IV fluconazole 200 mg x 1 dose and 100 mg daily Ceftriaxone daily Topical nystatin Wound care consult

## 2022-06-18 NOTE — ED Notes (Signed)
CBG 141, rechecked on other hand and is 136. Insulin paused.

## 2022-06-18 NOTE — Progress Notes (Signed)
       CROSS COVER NOTE  NAME: James [name] MRN: [id number] DOB : 1960-03-01    Concern from nurse Eyvonne Mechanic   hey doc, Pt is refusing insulin right now and cramping pretty bad in both hands., can I get a PRN morphine order or something for pain   he is screaming from the room at this time Okay sorry Pt has been medicated per ED provider but is still cramping badly in his hand. His endotool stated to start his insulin @19U /hr. Pt began cramping within 30 mins of start, K+ was 4.6. IV K+ was also started @the  same time as the insulin tho. Pt is refusing insulin drip @this  time. Last BGL was 493  Pt does not have a gap. Just wanted to inform you of what is happening      Pertinent findings on chart review: Patient admitted a few hours ago for HHS  Assessment and  Interventions   Assessment: Patient reassessed.  Repeat blood sugar 463.  He does not want the insulin  Plan: Will continue IV hydration Will give IV insulin 10 units x 1 with CBG every hour  with repeat IV insulin and  transition to subcu Oral potassium ordered

## 2022-06-18 NOTE — Inpatient Diabetes Management (Addendum)
Inpatient Diabetes Program Recommendations  AACE/ADA: New Consensus Statement on Inpatient Glycemic Control (2015)  Target Ranges:  Prepandial:   less than 140 mg/dL      Peak postprandial:   less than 180 mg/dL (1-2 hours)      Critically ill patients:  140 - 180 mg/dL   Lab Results  Component Value Date   GLUCAP 136 (H) 06/18/2022   HGBA1C 6.0 (A) 02/15/2022    Review of Glycemic Control  Latest Reference Range & Units 06/18/22 05:56 06/18/22 07:05 06/18/22 08:04 06/18/22 08:05  Glucose-Capillary 70 - 99 mg/dL 161 (H) 096 (H) 045 (H) 136 (H)  (H): Data is abnormally high Diabetes history: Type 2 DM Outpatient Diabetes medications: Metformin 1000 mg QD (NT), Ozempic 1 mg qwk (NT) Current orders for Inpatient glycemic control: IV insulin  Inpatient Diabetes Program Recommendations:    Noted IV insulin set at 0 units/hr due to significant drop in blood sugar.  In reviewing Endotool program, CBG checks and titration were delayed thus giving more insulin to patient.  Secure chat sent to MD and RN to ensure proper check times per [facility] program for patient safety. Will plan to check at 0900.   Addendum: Noted IV insulin discontinued. Current orders: Semglee 15 units QD and Novolog 0-15 Q4H. Secure chat sent to MD with updated recs- increase Semglee to BID and if trends exceed 400's mg/dL restart IV insulin.  Thanks, Lujean Rave, MSN, RNC-OB Diabetes Coordinator [phone] (8a-5p)

## 2022-06-18 NOTE — Progress Notes (Signed)
       CROSS COVER NOTE  NAME: James [name] MRN: [id number] DOB : 08/21/1960    Assessment and  Interventions   Assessment: Following discontinuation of transition from IV to subcu insulin BMP resulted with glucose 470 and anion gap of 19  Plan: Insulin infusion restarted per Endo tool Patient counseled on the importance X

## 2022-06-18 NOTE — ED Notes (Signed)
Wrote to Dr Georgeann Oppenheim to ask whether still to give long-acting insulin.

## 2022-06-19 DIAGNOSIS — E1100 Type 2 diabetes mellitus with hyperosmolarity without nonketotic hyperglycemic-hyperosmolar coma (NKHHC): Secondary | ICD-10-CM | POA: Diagnosis not present

## 2022-06-19 LAB — GLUCOSE, CAPILLARY
Glucose-Capillary: 175 mg/dL — ABNORMAL HIGH (ref 70–99)
Glucose-Capillary: 240 mg/dL — ABNORMAL HIGH (ref 70–99)
Glucose-Capillary: 267 mg/dL — ABNORMAL HIGH (ref 70–99)
Glucose-Capillary: 309 mg/dL — ABNORMAL HIGH (ref 70–99)
Glucose-Capillary: 312 mg/dL — ABNORMAL HIGH (ref 70–99)
Glucose-Capillary: 315 mg/dL — ABNORMAL HIGH (ref 70–99)
Glucose-Capillary: 324 mg/dL — ABNORMAL HIGH (ref 70–99)

## 2022-06-19 LAB — BASIC METABOLIC PANEL
Anion gap: 10 (ref 5–15)
BUN: 9 mg/dL (ref 6–20)
CO2: 29 mmol/L (ref 22–32)
Calcium: 8.4 mg/dL — ABNORMAL LOW (ref 8.9–10.3)
Chloride: 92 mmol/L — ABNORMAL LOW (ref 98–111)
Creatinine, Ser: 0.75 mg/dL (ref 0.61–1.24)
GFR, Estimated: >60 mL/min (ref >60)
Glucose, Bld: 243 mg/dL — ABNORMAL HIGH (ref 70–99)
Potassium: 4.4 mmol/L (ref 3.5–5.1)
Sodium: 131 mmol/L — ABNORMAL LOW (ref 135–145)

## 2022-06-19 MED ORDER — INSULIN GLARGINE-YFGN 100 UNIT/ML ~~LOC~~ SOLN
25.0000 [IU] | Freq: Every day | SUBCUTANEOUS | Status: DC
  Administered 2022-06-19 – 2022-06-20 (×2): 25 [IU] via SUBCUTANEOUS
  Filled 2022-06-19 (×2): qty 0.25

## 2022-06-19 NOTE — Evaluation (Signed)
Physical Therapy Evaluation Patient Details Name: James [name] MRN: [id number] DOB: [birth year] Today's Date: 06/19/2022  History of Present Illness  James [name] is a 62 y.o. 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 who was brought to the ED with a 2-day complaint of protracted weakness, shakiness and nonadherence with medication due to inability to perform ADLs, IADLs and help himself in any capacity at home where James [name] lives alone. In the ED pt was found to have glucose of 760. Pt admitted for further management.  Clinical Impression  Patient resting in bed upon arrival to room; alert and oriented, follows commands and agreeable for participation with session. Eager for OOB to chair.  Denies pain.  Bilat UE/LE strength and ROM grossly symmetrical and WFL for basic transfers as needed; R hip/knee with limited tolerance for end-range flexion (baseline, chronic arthritic changes per patient).  Currently completes bed mobility with close sup; sit/stand, standing balance and bed/chair transfer with RW, min/mod assist +2 (increased assist for sit/stand, improves in indep once upright).  Ambulation deferred, as patient non-ambulatory at baseline.  Anticipate good progression in transfer confidence/ability with continued repetition throughout remaining hospital stay. Would benefit from skilled PT to address above deficits and promote optimal return to PLOF.; recommend post-acute PT follow up as indicated by interdisciplinary care team.     Of note, bariatric recliner, RW and BSC placed in room for use throughout remainder of hospital stay.       Recommendations for follow up therapy are one component of a multi-disciplinary discharge planning process, led by the attending physician.  Recommendations  may be updated based on patient status, additional functional criteria and insurance authorization.  Follow Up Recommendations       Assistance Recommended at Discharge    Patient can return home with the following  A little help with walking and/or transfers;A little help with bathing/dressing/bathroom    Equipment Recommendations    Recommendations for Other Services       Functional Status Assessment Patient has had a recent decline in their functional status and demonstrates the ability to make significant improvements in function in a reasonable and predictable amount of time.     Precautions / Restrictions Precautions Precautions: Fall Restrictions Weight Bearing Restrictions: No      Mobility  Bed Mobility Overal bed mobility: Needs Assistance Bed Mobility: Supine to Sit     Supine to sit: Supervision          Transfers Overall transfer level: Needs assistance Equipment used: Rolling walker (2 wheels) Transfers: Sit to/from Stand, Bed to chair/wheelchair/BSC Sit to Stand: +2 physical assistance, Mod assist, From elevated surface Stand pivot transfers: Min assist, +2 physical assistance         General transfer comment: heavy use of UEs for lift off; stabilizes to min assist +1-2 once upright    Ambulation/Gait               General Gait Details: non-ambulatory at baseline  Stairs            Wheelchair Mobility    Modified Rankin (Stroke Patients Only)       Balance Overall balance assessment: Needs assistance Sitting-balance support: No upper extremity supported, Feet supported Sitting balance-Leahy Scale: Good     Standing balance support: Bilateral upper extremity supported Standing balance-Leahy Scale: Fair  Pertinent Vitals/Pain Pain Assessment Pain Assessment: No/denies pain    Home Living Family/patient expects to be discharged to:: Private residence Living Arrangements:  Alone Available Help at Discharge: Neighbor;Available PRN/intermittently Type of Home: Apartment Home Access: Elevator       Home Layout: One level Home Equipment: Wheelchair - power;Tub bench Additional Comments: [employer] WC has had a flat Tire for last month. James [name] is awaiting a new tire and is hopeful it will be delivered soon (company already aware)    Prior Function Prior Level of Function : Independent/Modified Independent             Mobility Comments: ~ 1 month PTA was able to stand pivot to electric WC. Since it has had a flat tire, James [name] has gotten much more limited ADLs Comments: Was generaly able to take a few steps to/from [facility] to toilet, shower chair, etc.Has gotten progressively weaker over the last month.     Hand Dominance   Dominant Hand: Right    Extremity/Trunk Assessment   Upper Extremity Assessment Upper Extremity Assessment: Overall WFL for tasks assessed (grossly 4/5 throughout)    Lower Extremity Assessment Lower Extremity Assessment: Generalized weakness (grossly 4- to 4/5 throughout; limited tolerance for flexion of R hip/knee (endorses chronic, arthritic changes; needs THR per report))       Communication   Communication: No difficulties  Cognition Arousal/Alertness: Awake/alert Behavior During Therapy: WFL for tasks assessed/performed Overall Cognitive Status: Within Functional Limits for tasks assessed                                          General Comments      Exercises     Assessment/Plan    PT Assessment Patient needs continued PT services  PT Problem List Decreased strength;Decreased range of motion;Decreased balance;Decreased activity tolerance;Decreased mobility;Decreased knowledge of use of DME;Decreased safety awareness;Decreased knowledge of precautions       PT Treatment Interventions DME instruction;Functional mobility training;Therapeutic activities;Patient/family education;Therapeutic exercise;Balance  training    PT Goals (Current goals can be found in the Care Plan section)  Acute Rehab PT Goals Patient Stated Goal: to get my strength back and get better PT Goal Formulation: With patient Time For Goal Achievement: 07/03/22 Potential to Achieve Goals: Good Additional Goals Additional Goal #1: Indep with HEP for LE strengthening, pressure relief and overall conditioning to maximize safety/indep/partipation with functional activities    Frequency Min 4X/week     Co-evaluation               AM-PAC PT "6 Clicks" Mobility  Outcome Measure Help needed turning from your back to your side while in a flat bed without using bedrails?: None Help needed moving from lying on your back to sitting on the side of a flat bed without using bedrails?: None Help needed moving to and from a bed to a chair (including a wheelchair)?: A Lot Help needed standing up from a chair using your arms (e.g., wheelchair or bedside chair)?: A Lot Help needed to walk in hospital room?: Total Help needed climbing 3-5 steps with a railing? : Total 6 Click Score: 14    End of Session Equipment Utilized During Treatment: Gait belt Activity Tolerance: Patient tolerated treatment well Patient left: in chair;with call bell/phone within reach (alarm pad in place; box not in room. RN informed/aware; to locate for room as needed/available) Nurse Communication: Mobility status PT  Visit Diagnosis: Muscle weakness (generalized) (M62.81)    Time: 1610-9604 PT Time Calculation (min) (ACUTE ONLY): 18 min   Charges:   PT Evaluation $PT Eval Moderate Complexity: 1 Mod         Irean Kendricks H. Manson Passey, PT, DPT, NCS 06/19/22, 3:30 PM [phone]

## 2022-06-19 NOTE — TOC Initial Note (Signed)
Transition of Care [facility]) - Initial/Assessment Note    Patient Details  Name: James [name] MRN: [id number] Date of Birth: 02-Feb-1960  Transition of Care [facility]) CM/SW Contact:    Liliana Cline, LCSW Phone Number: 06/19/2022, 5:02 PM  Clinical Narrative:                 CSW spoke to patient regarding PT and OT recs for Home Health. Patient is from home alone. Uses [employer] transportation services.  Patient says he has a new PCP appointment on 6/6 but he cannot remember who it is with, he has it written down at home. Previously saw Dr. Althea Charon. Pharmacy is [facility].  Patient has an Mining engineer wheelchair and tub bench at home. Patient says he has called [employer] and told them his tire is messed up on his wheelchair, he said he expects it to be fixed when he gets home. Patient says he has their contact information at home to follow up if needed. Patient states he has a neighbor who lives down the hall in his apartment and can help him as needed. His brother can provide transportation at times, but patient says his brother is unable to take him home at DC. Patient requests EMS or wheelchair transport when DC.  Patient is agreeable to [facility], confirmed home address in chart. Barbara Cower with [facility] is checking to see if they can accept.   Expected Discharge Plan: Home w Home Health Services Barriers to Discharge: Continued Medical Work up   Patient Goals and CMS Choice Patient states their goals for this hospitalization and ongoing recovery are:: home with [facility] CMS Medicare.gov Compare Post Acute Care list provided to:: Patient Choice offered to / list presented to : Patient Reserve ownership interest in [facility].provided to:: Patient    Expected Discharge Plan and Services       Living arrangements for the past 2 months: Single Family Home                                      Prior Living Arrangements/Services Living arrangements for  the past 2 months: Single Family Home Lives with:: Self Patient language and need for interpreter reviewed:: Yes Do you feel safe going back to the place where you live?: Yes      Need for Family Participation in Patient Care: Yes (Comment) Care giver support system in place?: Yes (comment)   Criminal Activity/Legal Involvement Pertinent to Current Situation/Hospitalization: No - Comment as needed  Activities of Daily Living Home Assistive Devices/Equipment: Wheelchair ADL Screening (condition at time of admission) Patient's cognitive ability adequate to safely complete daily activities?: Yes Is the patient deaf or have difficulty hearing?: No Does the patient have difficulty seeing, even when wearing glasses/contacts?: No Does the patient have difficulty concentrating, remembering, or making decisions?: No Patient able to express need for assistance with ADLs?: Yes Does the patient have difficulty dressing or bathing?: Yes Independently performs ADLs?: Yes (appropriate for developmental age) Does the patient have difficulty walking or climbing stairs?: Yes Weakness of Legs: Right Weakness of Arms/Hands: None  Permission Sought/Granted Permission sought to share information with : Facility Industrial/product designer granted to share information with : Yes, Verbal Permission Granted     Permission granted to share info w AGENCY: HH and DME agencies        Emotional Assessment  Orientation: : Oriented to Self, Oriented to Situation, Oriented to Place, Oriented to  Time Alcohol / Substance Use: Not Applicable Psych Involvement: No (comment)  Admission diagnosis:  Hyperosmolar hyperglycemic state (HHS) (HCC) [E11.00] Patient Active Problem List   Diagnosis Date Noted   Cellulitis 06/18/2022   Candidal intertrigo 06/18/2022   Hyperosmolar hyperglycemic state (HHS) (HCC) 06/17/2022   Chronic, continuous use of opioids 06/17/2022   Hyponatremia 06/17/2022    Hypercapnia 06/17/2022   Generalized weakness 06/17/2022   Asthma 03/01/2022   Dyspnea on exertion 03/01/2022   Hypertensive disorder 03/01/2022   Hypercholesterolemia 03/01/2022   Mild persistent asthma without complication 02/15/2022   Class 3 severe obesity with serious comorbidity and body mass index (BMI) of 50.0 to 59.9 in adult [facility]) 02/15/2022   Chronic thigh pain (Right) 12/14/2021    Class: Chronic   Class 3 obesity with alveolar hypoventilation, serious comorbidity, and body mass index (BMI) of 45.0 to 49.9 in adult (HCC) 11/04/2021   Epidural lipomatosis (L1-2 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) 11/03/2021   Lumbar facet arthropathy (Multilevel) (L1-2 through L5-S1) (Bilateral) 11/03/2021   Lumbar central spinal stenosis, 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 11/03/2021   Lumbar foraminal stenosis (Bilateral: L3-4, L4-5) (Left: L2-3) 11/03/2021   Lumbar lateral recess stenosis (Right: L3-4) (Bilateral: L4-5) 11/03/2021   Lumbar nerve root impingement (Right: L4) 11/03/2021   Herniated nucleus pulposus, L3-4 (Right) 11/03/2021   Hyperlipidemia associated with type 2 diabetes mellitus (HCC) 06/25/2021   Abnormal MRI, lumbar spine (05/21/2021) 05/24/2021   Chronic arthropathy 05/24/2021   Pes planus 05/24/2021   Osteoarthritis of AC (acromioclavicular) joint (Left) 04/29/2021   Osteoarthritis of shoulder (Left) 04/29/2021   Rotator cuff arthropathy of shoulder (Left) 04/29/2021   Lumbosacral facet hypertrophy (L4-5, L5-S1) 04/29/2021   Lumbar facet syndrome 04/29/2021   Abnormal MRI, hip (04/21/2021) 04/29/2021   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) 04/29/2021   Abnormal EMG (electromyogram) (03/16/2021) 04/29/2021   Polyneuropathy, 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   Diabetic sensorimotor polyneuropathy (HCC) 04/29/2021   Diabetic neuropathy (HCC) 04/26/2021   Osteoarthritis of hips (Bilateral) 03/29/2021   Osteoarthritis of hip (Left) 03/29/2021   Osteoarthritis of hip (Right) 03/29/2021    Osteoarthritis of knee (Left) 03/29/2021   Elevated sed rate 03/29/2021   Vitamin D deficiency 03/29/2021   Elevated C-reactive protein (CRP) 03/29/2021   Lumbosacral radiculopathy at L5 (Left) 03/29/2021   Fall (03/22/2021) 03/29/2021   Pharmacologic therapy 02/15/2021   Disorder of skeletal system 02/15/2021   Problems influencing health status 02/15/2021   Chronic use of opiate for therapeutic purpose 02/15/2021   Type 2 diabetes mellitus with morbid obesity (HCC) 02/15/2021   Chronic lower extremity pain (1ry area of Pain) (Bilateral) 02/15/2021   Chronic low back pain (4th area of Pain) (Bilateral) w/ sciatica (Bilateral) 02/15/2021   Chronic shoulder pain (5th area of Pain) (Left) 02/15/2021   Chronic hip pain (2ry area of Pain) (Bilateral) 02/15/2021   Chronic knee pain (3ry area of Pain) (Bilateral) 02/15/2021   Weakness of lower extremities (Bilateral) 01/15/2021   Recurrent falls 01/15/2021   Chronic pain syndrome 11/09/2020   DDD (degenerative disc disease), lumbar 11/09/2020   Primary osteoarthritis involving multiple joints 11/09/2020   Diabetes mellitus (HCC) 11/09/2020   PCP:  Smitty Cords, DO Pharmacy:   [facility] [address]), [place] - [address] Jerilynn Mages [place]) [place] [address] Phone: [phone] Fax: [phone]  [facility] - [place], [place] - [address] [place] [address] Phone: [phone] Fax: [phone]  [id number] - [address] [place] - [address] [place] [address] Phone: [phone] Fax: [phone]  Patsy Lager [address] [place] [address] Phone: [phone] Fax: [phone]  [facility] - [place], [place] - [address] [place] [address] Phone: [phone] Fax: [phone]     Social Determinants of Health  (SDOH) Social History: SDOH Screenings   Food Insecurity: No Food Insecurity (06/18/2022)  Housing: Low Risk  (06/18/2022)  Transportation Needs: No Transportation Needs (06/18/2022)  Utilities: Not At Risk (06/18/2022)  Alcohol Screen: Low Risk  (05/04/2022)  Depression (PHQ2-9): Low Risk  (05/25/2022)  Financial Resource Strain: Low Risk  (03/25/2022)  Physical Activity: Inactive (03/25/2022)  Social Connections: Socially Isolated (03/25/2022)  Stress: No Stress Concern Present (03/25/2022)  Tobacco Use: High Risk (06/17/2022)   SDOH Interventions:     Readmission Risk Interventions     No data to display

## 2022-06-19 NOTE — Evaluation (Signed)
Occupational Therapy Evaluation Patient Details Name: James [name] MRN: [id number] DOB: 1960/03/26 Today's Date: 06/19/2022   History of Present Illness James [name] is a 62 y.o. 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 who was brought to the ED with a 2-day complaint of protracted weakness, shakiness and nonadherence with medication due to inability to perform ADLs, IADLs and help himself in any capacity at home where he lives alone. In the ED pt was found to have glucose of 760. Pt admitted for further management.   Clinical Impression   James [name] was seen for OT evaluation this date. Prior to hospital admission, pt was reports he had been managing his ADLs/IADLs with MOD I at home until ~ 1 month PTA. He reports getting a flat tire in his electric WC which he uses as his primary method of mobility. Pt states on a good day he is able to stand pivot to his WC and can take a few steps to get on/off his commode, in his shower, etc. Pt lives alone in an apartment home with an elevator to enter. Pt presents to acute OT demonstrating impaired ADL performance and functional mobility 2/2 generalized weakness, decreased activity tolerance, and increased pain with mobility (See OT problem list for additional functional deficits). Pt currently requires SUPERVISION for bed mobility. MOD A +2 for STS t/fs and to take side steps at EOB.  Pt would benefit from skilled OT services to address noted impairments and functional limitations (see below for any additional details) in order to maximize safety and independence while minimizing falls risk and caregiver burden.Anticipate the need for follow up OT services upon acute hospital DC.       Recommendations for follow up therapy are one component of a  multi-disciplinary discharge planning process, led by the attending physician.  Recommendations may be updated based on patient status, additional functional criteria and insurance authorization.   Assistance Recommended at Discharge Intermittent Supervision/Assistance  Patient can return home with the following A lot of help with bathing/dressing/bathroom;A lot of help with walking and/or transfers;Assist for transportation;Assistance with cooking/housework    Functional Status Assessment  Patient has had a recent decline in their functional status and demonstrates the ability to make significant improvements in function in a reasonable and predictable amount of time.  Equipment Recommendations  Other (comment) (Defer)    Recommendations for Other Services       Precautions / Restrictions Precautions Precautions: Fall Restrictions Weight Bearing Restrictions: No      Mobility Bed Mobility Overal bed mobility: Needs Assistance Bed Mobility: Supine to Sit     Supine to sit: Supervision, HOB elevated     General bed mobility comments: Increased time/effort to perform but pt is ultimately able to come to sitting at EOB without physical assist.    Transfers Overall transfer level: Needs assistance Equipment used: Rolling walker (2 wheels) Transfers: Sit to/from Stand Sit to Stand: +2 physical assistance, Mod assist, From elevated surface           General transfer comment: STS from elevated bed height, increased time/effort to perform side steps at EOB with MOD A +2 for balance and to advance RW.      Balance Overall balance assessment: Needs assistance Sitting-balance support: Feet supported, No upper extremity supported Sitting balance-Leahy Scale: Normal Sitting balance - Comments:  steady static sitting, reaching outside BOS at EOB.   Standing balance support: Reliant on assistive device for balance, During functional activity, Bilateral upper extremity  supported Standing balance-Leahy Scale: Fair Standing balance comment: Requires support with weight shift.                           ADL either performed or assessed with clinical judgement   ADL Overall ADL's : Needs assistance/impaired                                       General ADL Comments: Pt is functionally limited by increased pain with mobility, generalized weakness, obesity, and decreased activity tolerance. He requires supervision for sup>sit t/f. MOD A +2 for STS attempts and to take side steps at EOB. Anticipate MOD/MAX A for LB dressing and bathing from STS.     Vision Patient Visual Report: No change from baseline       Perception     Praxis      Pertinent Vitals/Pain Pain Assessment Pain Assessment: 0-10 Pain Score: 10-Worst pain ever Pain Location: RLE and back Pain Descriptors / Indicators: Aching, Sore, Grimacing Pain Intervention(s): Patient requesting pain meds-RN notified, Limited activity within patient's tolerance, Monitored during session, Repositioned, Premedicated before session     Hand Dominance Right   Extremity/Trunk Assessment Upper Extremity Assessment Upper Extremity Assessment: Generalized weakness (Increased edema in BUE)   Lower Extremity Assessment Lower Extremity Assessment: Generalized weakness       Communication Communication Communication: No difficulties   Cognition Arousal/Alertness: Awake/alert Behavior During Therapy: WFL for tasks assessed/performed Overall Cognitive Status: Within Functional Limits for tasks assessed                                 General Comments: Pleasant, conversational, motivated to get OOB.     General Comments       Exercises Other Exercises Other Exercises: Pt educated on role of OT in acute setting, safety, falls prevention for home and hospital, and DC recs.   Shoulder Instructions      Home Living Family/patient expects to be discharged  to:: Private residence Living Arrangements: Alone Available Help at Discharge: Neighbor;Available PRN/intermittently Type of Home: Apartment Home Access: Elevator     Home Layout: One level     Bathroom Shower/Tub: Runner, broadcasting/film/video: Wheelchair - power;Tub bench   Additional Comments: Electri WC has had a flat Tire for last month. He is awaiting a new tire and is hopeful it will be delivered soon.      Prior Functioning/Environment Prior Level of Function : Independent/Modified Independent             Mobility Comments: ~ 1 month PTA was able to stand pivot to electric WC. Since it has had a flat tire, he has gotten much more limited ADLs Comments: Was generaly able to take a few steps to/from [facility] to toilet, shower chair, etc.Has gotten progressively weaker over the last month.        OT Problem List: Decreased strength;Decreased coordination;Pain;Decreased activity tolerance;Decreased safety awareness;Impaired balance (sitting and/or standing);Decreased knowledge of use of DME or AE;Impaired UE functional use;Decreased range of motion;Increased edema;Obesity      OT Treatment/Interventions: Self-care/ADL training;Therapeutic exercise;Therapeutic activities;DME and/or AE instruction;Patient/family  education;Balance training;Energy conservation    OT Goals(Current goals can be found in the care plan section) Acute Rehab OT Goals Patient Stated Goal: To go home and get therapy OT Goal Formulation: With patient Time For Goal Achievement: 07/03/22 Potential to Achieve Goals: Good ADL Goals Pt Will Perform Lower Body Dressing: sit to/from stand;with modified independence;with adaptive equipment Pt Will Transfer to Toilet: bedside commode;stand pivot transfer;with modified independence Pt Will Perform Toileting - Clothing Manipulation and hygiene: sit to/from stand;sitting/lateral leans;with adaptive equipment;with modified independence  OT Frequency: Min  2X/week    Co-evaluation              AM-PAC OT "6 Clicks" Daily Activity     Outcome Measure Help from another person eating meals?: None Help from another person taking care of personal grooming?: None Help from another person toileting, which includes using toliet, bedpan, or urinal?: A Lot Help from another person bathing (including washing, rinsing, drying)?: A Lot Help from another person to put on and taking off regular upper body clothing?: A Little Help from another person to put on and taking off regular lower body clothing?: A Lot 6 Click Score: 17   End of Session Equipment Utilized During Treatment: Gait belt;Rolling walker (2 wheels) Nurse Communication: Mobility status;Other (comment) (Pt requests to stay sitting at EOB)  Activity Tolerance: Patient tolerated treatment well Patient left: with bed alarm set;in bed;with call bell/phone within reach (seated EOB per pt request)  OT Visit Diagnosis: Other abnormalities of gait and mobility (R26.89);Muscle weakness (generalized) (M62.81);Pain Pain - Right/Left: Right Pain - part of body: Knee;Leg                Time: 1191-4782 OT Time Calculation (min): 33 min Charges:  OT General Charges $OT Visit: 1 Visit OT Evaluation $OT Eval Moderate Complexity: 1 Mod OT Treatments $Self Care/Home Management : 8-22 mins  Rockney Ghee, M.S., OTR/L 06/19/22, 12:14 PM'

## 2022-06-19 NOTE — Plan of Care (Signed)
  Problem: Education: Goal: Knowledge of General Education information will improve Description: Including pain rating scale, medication(s)/side effects and non-pharmacologic comfort measures Outcome: Progressing   Problem: Health Behavior/Discharge Planning: Goal: Ability to manage health-related needs will improve Outcome: Progressing   Problem: Clinical Measurements: Goal: Diagnostic test results will improve Outcome: Progressing Goal: Cardiovascular complication will be avoided Outcome: Progressing   Problem: Activity: Goal: Risk for activity intolerance will decrease Outcome: Progressing   Problem: Nutrition: Goal: Adequate nutrition will be maintained Outcome: Progressing   Problem: Pain Managment: Goal: General experience of comfort will improve Outcome: Progressing   

## 2022-06-19 NOTE — Progress Notes (Signed)
PROGRESS NOTE    James [name]  ZOX:[id number] DOB: Oct 07, 1960 DOA: 06/17/2022 PCP: Smitty Cords, DO    Brief Narrative:  62 y.o. 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 who was brought to the ED with a 2-day complaint of protracted weakness, shakiness and nonadherence with medication due to inability to perform ADLs, IADLs and help himself in any capacity at home where he lives alone.  Patient was declined ESI at his last pain clinic appointment on 5/8 due to concern for worsening weight gain and lately, due to his limitations, has been unable to help himself and get out of bed . He denies nausea, vomiting, fever or chills and denies abdominal pain or diarrhea.  He has no cough or shortness of breath or chest pain.  He denies visual disturbance or one-sided weakness numbness or tingling. He complains of increased severity of his usual musculoskeletal pain. ED course and data review: Vitals within normal limits.  Labs notable for glucose of 760 with normal anion gap, normal bicarb.  Sodium 122.  Beta hydroxybutyric acid 0.27 and venous pH 7.33.  pCO2 on VBG noted to be high at 72.  Urinalysis unremarkable.  WBC 12,000 with normal hemoglobin. EKG, personally reviewed and interpreted showing sinus at 78 with no concerning ST-T wave changes. Patient was given at 3 L NS bolus and started on an insulin infusion and given potassium repletion per Endo tool. Hospitalist consulted for admission.    Assessment & Plan:   Principal Problem:   Hyperosmolar hyperglycemic state (HHS) (HCC) Active Problems:   Cellulitis   Candidal intertrigo   Chronic pain syndrome   Class 3 severe obesity with serious comorbidity and body mass index (BMI) of 50.0 to 59.9 in adult [facility])   Chronic,  continuous use of opioids   Hyponatremia   Hypercapnia   DDD (degenerative disc disease), lumbar   Chronic use of opiate for therapeutic purpose   Chronic arthropathy   Mild persistent asthma without complication   Generalized weakness   Hyperosmolar hyperglycemic state (HHS) (HCC) Glycemic control improved.  Patient did have briefly have a elevated anion gap.  This is now closed.  Sugars have improved. Plan: Continue basal bolus regimen.  Semglee 25 units daily.  Sliding scale.  Titrate regimen as tolerated.  Goal blood sugar while hospitalized 140-180.  Continue carb modified diet.  Diabetes coordinator follow-up.  Cellulitis Candidal intertrigo with secondary bacterial infection IV fluconazole 200 mg x 1 dose and 100 mg daily Ceftriaxone daily Topical nystatin Pending wound care consult   Hypercapnia Mild intermittent asthma VBG with pCO2 of 72.  O2 sats on room air 92-93 Suspect underlying OSA/OHS DuoNebs as needed Might benefit from sleep study as outpatient As needed oxygen , might benefit from CPAP   Hyponatremia Likely depressed in the setting of hyperglycemia   Class 3 severe obesity with serious comorbidity and body mass index (BMI) of 50.0 to 59.9 in adult [facility]) Complicating factor to overall prognosis and care   Chronic pain syndrome Chronic continuous use of opiates History of epidural steroid injections Multimodal pain control with home oxycodone and Lyrica Toradol for breakthrough with Dilaudid as needed Physical therapy evaluation   Generalized weakness Related to HHS and chronic physical deconditioning PT consult   DVT prophylaxis: SQ Lovenox Code Status: Full Family Communication: None Disposition  Plan: Status is: Inpatient Remains inpatient appropriate because: Uncontrolled diabetes   Level of care: Telemetry Medical  Consultants:  None  Procedures:  None  Antimicrobials: Ceftriaxone Fluconazole   Subjective: Seen and examined.   Much better today.    Objective: Vitals:   06/18/22 1404 06/18/22 1425 06/19/22 0114 06/19/22 0752  BP:  100/74 (!) 106/44 (!) 153/115  Pulse:  72 81 63  Resp:  15 18 18   Temp:  98.3 F (36.8 C) (!) 97.4 F (36.3 C) 97.9 F (36.6 C)  TempSrc:      SpO2:  (!) 82% 96% 99%  Weight:      Height: 6' (1.829 m)       Intake/Output Summary (Last 24 hours) at 06/19/2022 1055 Last data filed at 06/19/2022 0515 Gross per 24 hour  Intake 2859.42 ml  Output 350 ml  Net 2509.42 ml   Filed Weights   06/17/22 2300  Weight: (!) 182.3 kg    Examination:  General exam: No acute distress Respiratory system: Lung sounds decreased at bases.  Normal work of breathing.  Room air Cardiovascular system: S1-S2, RRR, no murmurs, no pedal edema Gastrointestinal system: Obese, NT/ND, bowel sounds Central nervous system: Alert and oriented. No focal neurological deficits. Extremities: Strength intact Skin: Inguinal rash Psychiatry: Judgement and insight appear normal. Mood & affect appropriate.     Data Reviewed: I have personally reviewed following labs and imaging studies  CBC: Recent Labs  Lab 06/17/22 2109  WBC 12.3*  NEUTROABS 9.6*  HGB 16.5  HCT 50.1  MCV 87.7  PLT 294   Basic Metabolic Panel: Recent Labs  Lab 06/17/22 2109 06/17/22 2345 06/18/22 0601 06/18/22 1058 06/19/22 0339  NA 122* 129* 131* 131* 131*  K 4.6 3.4* 4.3 4.4 4.4  CL 80* 85* 89* 93* 92*  CO2 29 25 30 29 29   GLUCOSE 760* 470* 381* 253* 243*  BUN 9 9 8 8 9   CREATININE 1.00 1.05 0.76 0.85 0.75  CALCIUM 8.7* 8.9 8.8* 8.8* 8.4*   GFR: Estimated Creatinine Clearance: 166 mL/min (by C-G formula based on SCr of 0.75 mg/dL). Liver Function Tests: Recent Labs  Lab 06/17/22 2109  AST 14*  ALT 9  ALKPHOS 135*  BILITOT 1.1  PROT 6.8  ALBUMIN 3.9   No results for input(s): "LIPASE", "AMYLASE" in the last 168 hours. No results for input(s): "AMMONIA" in the last 168 hours. Coagulation Profile: No  results for input(s): "INR", "PROTIME" in the last 168 hours. Cardiac Enzymes: No results for input(s): "CKTOTAL", "CKMB", "CKMBINDEX", "TROPONINI" in the last 168 hours. BNP (last 3 results) No results for input(s): "PROBNP" in the last 8760 hours. HbA1C: No results for input(s): "HGBA1C" in the last 72 hours. CBG: Recent Labs  Lab 06/18/22 1543 06/18/22 1953 06/18/22 2345 06/19/22 0355 06/19/22 0753  GLUCAP 381* 288* 275* 240* 267*   Lipid Profile: No results for input(s): "CHOL", "HDL", "LDLCALC", "TRIG", "CHOLHDL", "LDLDIRECT" in the last 72 hours. Thyroid Function Tests: No results for input(s): "TSH", "T4TOTAL", "FREET4", "T3FREE", "THYROIDAB" in the last 72 hours. Anemia Panel: No results for input(s): "VITAMINB12", "FOLATE", "FERRITIN", "TIBC", "IRON", "RETICCTPCT" in the last 72 hours. Sepsis Labs: No results for input(s): "PROCALCITON", "LATICACIDVEN" in the last 168 hours.  No results found for this or any previous visit (from the past 240 hour(s)).       Radiology Studies: No results found.      Scheduled Meds:  atorvastatin  40 mg Oral Daily   enoxaparin (LOVENOX) injection  90 mg Subcutaneous Q24H   fluticasone furoate-vilanterol  1 puff Inhalation Daily   insulin aspart  0-15 Units Subcutaneous Q4H   insulin glargine-yfgn  25 Units Subcutaneous Daily   losartan  25 mg Oral Daily   nystatin   Topical TID   oxyCODONE  10 mg Oral BID   pantoprazole  40 mg Oral QAC breakfast   pregabalin  150 mg Oral BID   Continuous Infusions:  cefTRIAXone (ROCEPHIN)  IV Stopped (06/19/22 0229)   fluconazole (DIFLUCAN) IV 50 mL/hr at 06/19/22 0332     LOS: 2 days     Tresa Moore, MD [facility]   If 7PM-7AM, please contact night-coverage  06/19/2022, 10:55 AM

## 2022-06-20 DIAGNOSIS — E1100 Type 2 diabetes mellitus with hyperosmolarity without nonketotic hyperglycemic-hyperosmolar coma (NKHHC): Secondary | ICD-10-CM | POA: Diagnosis not present

## 2022-06-20 LAB — BASIC METABOLIC PANEL
Anion gap: 6 (ref 5–15)
BUN: 9 mg/dL (ref 6–20)
CO2: 32 mmol/L (ref 22–32)
Calcium: 8.7 mg/dL — ABNORMAL LOW (ref 8.9–10.3)
Chloride: 100 mmol/L (ref 98–111)
Creatinine, Ser: 0.6 mg/dL — ABNORMAL LOW (ref 0.61–1.24)
GFR, Estimated: >60 mL/min (ref >60)
Glucose, Bld: 168 mg/dL — ABNORMAL HIGH (ref 70–99)
Potassium: 3.9 mmol/L (ref 3.5–5.1)
Sodium: 138 mmol/L (ref 135–145)

## 2022-06-20 LAB — GLUCOSE, CAPILLARY
Glucose-Capillary: 109 mg/dL — ABNORMAL HIGH (ref 70–99)
Glucose-Capillary: 166 mg/dL — ABNORMAL HIGH (ref 70–99)
Glucose-Capillary: 224 mg/dL — ABNORMAL HIGH (ref 70–99)
Glucose-Capillary: 267 mg/dL — ABNORMAL HIGH (ref 70–99)
Glucose-Capillary: 388 mg/dL — ABNORMAL HIGH (ref 70–99)
Glucose-Capillary: 402 mg/dL — ABNORMAL HIGH (ref 70–99)

## 2022-06-20 MED ORDER — INSULIN ASPART 100 UNIT/ML IJ SOLN
6.0000 [IU] | Freq: Three times a day (TID) | INTRAMUSCULAR | Status: DC
  Administered 2022-06-20 – 2022-06-21 (×4): 6 [IU] via SUBCUTANEOUS
  Filled 2022-06-20 (×4): qty 1

## 2022-06-20 MED ORDER — FLUCONAZOLE 100 MG PO TABS
100.0000 mg | ORAL_TABLET | Freq: Every day | ORAL | Status: DC
  Administered 2022-06-20: 100 mg via ORAL
  Filled 2022-06-20: qty 1

## 2022-06-20 MED ORDER — INSULIN GLARGINE-YFGN 100 UNIT/ML ~~LOC~~ SOLN
28.0000 [IU] | Freq: Every day | SUBCUTANEOUS | Status: DC
  Administered 2022-06-21: 28 [IU] via SUBCUTANEOUS
  Filled 2022-06-20: qty 0.28

## 2022-06-20 NOTE — Plan of Care (Signed)
  RD consulted for nutrition education regarding diabetes.   Lab Results  Component Value Date   HGBA1C 6.0 (A) 02/15/2022  PTA medications reviewed and include 1000 mg metformin daily and 1 mg ozempic weekly.   Labs reviewed: CBGS: 175-309 (inpatient orders for glycemic control are 0-15 units insulin aspart every 4 hours, 6 units insulin aspart TID, and 28 units insulin glargine-yfgn daily).    Spoke with pt at bedside, who reports feeling better today. He shares that he feels better from first being admitted. Pt shares that he was diagnosed with DM approximately 5 years ago, but never received any formal education for this. Per pt, he eats "anything and everything", but has recently started to make healthier choices in order to lose weight to better qualify to hip surgery. Pt reports it has been difficult for him to get around over the past over the past few days PTA as one of the wheels on his electric wheelchair is broken.   Pt lives alone, but uses his electric wheelchair to get around. Pt reports he also has transportation to MD appointments. He reports no difficulty taking his medications and that he was taking his medications PTA.   Pt consumes bacon and eggs for breakfast and meat, starch, and vegetables for lunch. Pt admits to drinking a lot of juice and adding brown sugar to his sweet potatoes. Spent most of the visit discussing ways to decrease sugars in his diet as well as low calorie beverages. Pt is interested in learning more about DM and is agreeable to outpatient education.   RD provided "Carbohydrate Counting for People with Diabetes" and "Plate Method" handouts from the Academy of [employer] and Dietetics. Discussed different food groups and their effects on blood sugar, emphasizing carbohydrate-containing foods. Provided list of carbohydrates and recommended serving sizes of common foods.  Discussed importance of controlled and consistent carbohydrate intake throughout the day.  Provided examples of ways to balance meals/snacks and encouraged intake of high-fiber, whole grain complex carbohydrates. Teach back method used.  Expect fair to good compliance.  Body mass index is 54.52 kg/m. Pt meets criteria for obesity, class III based on current BMI. Obesity is a complex, chronic medical condition that is optimally managed by a multidisciplinary care team. Weight loss is not an ideal goal for an acute inpatient hospitalization. However, if further work-up for obesity is warranted, consider outpatient referral to 's Nutrition and Diabetes Education Services.    Current diet order is carb modified, patient is consuming approximately 100% of meals at this time. Labs and medications reviewed. No further nutrition interventions warranted at this time. RD contact information provided. If additional nutrition issues arise, please re-consult RD.  Levada Schilling, RD, LDN, CDCES Registered Dietitian II Certified Diabetes Care and Education Specialist Please refer to [facility] for RD and/or RD on-call/weekend/after hours pager

## 2022-06-20 NOTE — Discharge Instructions (Signed)

## 2022-06-20 NOTE — Progress Notes (Signed)
PROGRESS NOTE    James [name]  ZOX:[id number] DOB: 1960/10/10 DOA: 06/17/2022 PCP: Smitty Cords, DO    Brief Narrative:  62 y.o. 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 who was brought to the ED with a 2-day complaint of protracted weakness, shakiness and nonadherence with medication due to inability to perform ADLs, IADLs and help himself in any capacity at home where he lives alone.  Patient was declined ESI at his last pain clinic appointment on 5/8 due to concern for worsening weight gain and lately, due to his limitations, has been unable to help himself and get out of bed . He denies nausea, vomiting, fever or chills and denies abdominal pain or diarrhea.  He has no cough or shortness of breath or chest pain.  He denies visual disturbance or one-sided weakness numbness or tingling. He complains of increased severity of his usual musculoskeletal pain. ED course and data review: Vitals within normal limits.  Labs notable for glucose of 760 with normal anion gap, normal bicarb.  Sodium 122.  Beta hydroxybutyric acid 0.27 and venous pH 7.33.  pCO2 on VBG noted to be high at 72.  Urinalysis unremarkable.  WBC 12,000 with normal hemoglobin. EKG, personally reviewed and interpreted showing sinus at 78 with no concerning ST-T wave changes. Patient was given at 3 L NS bolus and started on an insulin infusion and given potassium repletion per Endo tool. Hospitalist consulted for admission.    Assessment & Plan:   Principal Problem:   Hyperosmolar hyperglycemic state (HHS) (HCC) Active Problems:   Cellulitis   Candidal intertrigo   Chronic pain syndrome   Class 3 severe obesity with serious comorbidity and body mass index (BMI) of 50.0 to 59.9 in adult [facility])   Chronic,  continuous use of opioids   Hyponatremia   Hypercapnia   DDD (degenerative disc disease), lumbar   Chronic use of opiate for therapeutic purpose   Chronic arthropathy   Mild persistent asthma without complication   Generalized weakness   Hyperosmolar hyperglycemic state (HHS) (HCC) Glycemic control improved.  Patient did have briefly have a elevated anion gap.  This is now closed.  Sugars have improved. Plan: Continue basal bolus regimen.  Increase Semglee to 28 units daily.  NovoLog 5 units 3 times daily with meals.  Sliding scale.  Check hemoglobin A1c.  DM coordinator follow-up.  RD consultation for dietary instruction.  Anticipate discharge 6/4.    Cellulitis Candidal intertrigo with secondary bacterial infection IV fluconazole 200 mg x 1 dose and 100 mg daily Ceftriaxone daily Topical nystatin WOC consult   Hypercapnia Mild intermittent asthma VBG with pCO2 of 72.  O2 sats on room air 92-93 Suspect underlying OSA/OHS DuoNebs as needed Might benefit from sleep study as outpatient As needed oxygen , might benefit from CPAP   Hyponatremia Likely depressed in the setting of hyperglycemia   Class 3 severe obesity with serious comorbidity and body mass index (BMI) of 50.0 to 59.9 in adult [facility]) Complicating factor to overall prognosis and care   Chronic pain syndrome Chronic continuous use of opiates History of epidural steroid injections Multimodal pain control with home oxycodone and Lyrica Toradol for breakthrough with Dilaudid as needed Physical therapy evaluation   Generalized weakness Related to HHS and chronic physical deconditioning PT consult   DVT prophylaxis: SQ  Lovenox Code Status: Full Family Communication: None Disposition Plan: Status is: Inpatient Remains inpatient appropriate because: Uncontrolled diabetes   Level of care: Telemetry Medical  Consultants:  None  Procedures:  None  Antimicrobials: Ceftriaxone Fluconazole    Subjective: Seen and examined.  Much better today.  Concern regarding waxing waning sugars    Objective: Vitals:   06/19/22 1653 06/19/22 1735 06/19/22 2348 06/20/22 0754  BP:  (!) 132/90 122/82 132/86  Pulse: 65  74 69  Resp: 19  20 16   Temp: 97.9 F (36.6 C)  98.4 F (36.9 C) 98 F (36.7 C)  TempSrc:      SpO2: 96%  93% 97%  Weight:      Height:        Intake/Output Summary (Last 24 hours) at 06/20/2022 1237 Last data filed at 06/20/2022 1159 Gross per 24 hour  Intake 1118.26 ml  Output 1325 ml  Net -206.74 ml   Filed Weights   06/17/22 2300  Weight: (!) 182.3 kg    Examination:  General exam: NAD Respiratory system: Diminished to bases.  No work of breathing.  Room air Cardiovascular system: S1-S2, RRR, no murmurs, no pedal edema Gastrointestinal system: Obese, NT/ND, bowel sounds Central nervous system: Alert and oriented. No focal neurological deficits. Extremities: Strength intact Skin: Inguinal rash Psychiatry: Judgement and insight appear normal. Mood & affect appropriate.     Data Reviewed: I have personally reviewed following labs and imaging studies  CBC: Recent Labs  Lab 06/17/22 2109  WBC 12.3*  NEUTROABS 9.6*  HGB 16.5  HCT 50.1  MCV 87.7  PLT 294   Basic Metabolic Panel: Recent Labs  Lab 06/17/22 2345 06/18/22 0601 06/18/22 1058 06/19/22 0339 06/20/22 0402  NA 129* 131* 131* 131* 138  K 3.4* 4.3 4.4 4.4 3.9  CL 85* 89* 93* 92* 100  CO2 25 30 29 29  32  GLUCOSE 470* 381* 253* 243* 168*  BUN 9 8 8 9 9   CREATININE 1.05 0.76 0.85 0.75 0.60*  CALCIUM 8.9 8.8* 8.8* 8.4* 8.7*   GFR: Estimated Creatinine Clearance: 166 mL/min (A) (by C-G formula based on SCr of 0.6 mg/dL (L)). Liver Function Tests: Recent Labs  Lab 06/17/22 2109  AST 14*  ALT 9  ALKPHOS 135*  BILITOT 1.1  PROT 6.8  ALBUMIN 3.9   No results for input(s): "LIPASE", "AMYLASE" in the last 168 hours. No results for input(s): "AMMONIA" in the last 168  hours. Coagulation Profile: No results for input(s): "INR", "PROTIME" in the last 168 hours. Cardiac Enzymes: No results for input(s): "CKTOTAL", "CKMB", "CKMBINDEX", "TROPONINI" in the last 168 hours. BNP (last 3 results) No results for input(s): "PROBNP" in the last 8760 hours. HbA1C: No results for input(s): "HGBA1C" in the last 72 hours. CBG: Recent Labs  Lab 06/19/22 2023 06/19/22 2343 06/20/22 0422 06/20/22 0750 06/20/22 1210  GLUCAP 315* 175* 166* 224* 267*   Lipid Profile: No results for input(s): "CHOL", "HDL", "LDLCALC", "TRIG", "CHOLHDL", "LDLDIRECT" in the last 72 hours. Thyroid Function Tests: No results for input(s): "TSH", "T4TOTAL", "FREET4", "T3FREE", "THYROIDAB" in the last 72 hours. Anemia Panel: No results for input(s): "VITAMINB12", "FOLATE", "FERRITIN", "TIBC", "IRON", "RETICCTPCT" in the last 72 hours. Sepsis Labs: No results for input(s): "PROCALCITON", "LATICACIDVEN" in the last 168 hours.  No results found for this or any previous visit (from the past 240 hour(s)).       Radiology Studies: No results found.      Scheduled Meds:  atorvastatin  40 mg  Oral Daily   enoxaparin (LOVENOX) injection  90 mg Subcutaneous Q24H   fluticasone furoate-vilanterol  1 puff Inhalation Daily   insulin aspart  0-15 Units Subcutaneous Q4H   insulin aspart  6 Units Subcutaneous TID WC   [START ON 06/21/2022] insulin glargine-yfgn  28 Units Subcutaneous Daily   losartan  25 mg Oral Daily   nystatin   Topical TID   oxyCODONE  10 mg Oral BID   pantoprazole  40 mg Oral QAC breakfast   pregabalin  150 mg Oral BID   Continuous Infusions:  cefTRIAXone (ROCEPHIN)  IV Stopped (06/20/22 0226)   fluconazole (DIFLUCAN) IV Stopped (06/20/22 0358)     LOS: 3 days     Tresa Moore, MD [facility]   If 7PM-7AM, please contact night-coverage  06/20/2022, 12:37 PM

## 2022-06-20 NOTE — Plan of Care (Signed)
  Problem: Education: Goal: Knowledge of General Education information will improve Description: Including pain rating scale, medication(s)/side effects and non-pharmacologic comfort measures Outcome: Progressing   Problem: Health Behavior/Discharge Planning: Goal: Ability to manage health-related needs will improve Outcome: Progressing   Problem: Clinical Measurements: Goal: Ability to maintain clinical measurements within normal limits will improve Outcome: Progressing Goal: Diagnostic test results will improve Outcome: Progressing Goal: Respiratory complications will improve Outcome: Progressing Goal: Cardiovascular complication will be avoided Outcome: Progressing   Problem: Activity: Goal: Risk for activity intolerance will decrease Outcome: Progressing   Problem: Nutrition: Goal: Adequate nutrition will be maintained Outcome: Progressing   Problem: Elimination: Goal: Will not experience complications related to bowel motility Outcome: Progressing Goal: Will not experience complications related to urinary retention Outcome: Progressing   Problem: Pain Managment: Goal: General experience of comfort will improve Outcome: Progressing   

## 2022-06-20 NOTE — Inpatient Diabetes Management (Addendum)
Inpatient Diabetes Program Recommendations  AACE/ADA: New Consensus Statement on Inpatient Glycemic Control (2015)  Target Ranges:  Prepandial:   less than 140 mg/dL      Peak postprandial:   less than 180 mg/dL (1-2 hours)      Critically ill patients:  140 - 180 mg/dL    Latest Reference Range & Units 06/18/22 23:45 06/19/22 03:55 06/19/22 07:53 06/19/22 11:44 06/19/22 15:51 06/19/22 19:47 06/19/22 20:23  Glucose-Capillary 70 - 99 mg/dL 706 (H)  8 units Novolog 240 (H)  5 units Novolog  267 (H)  8 units Novolog  25 units Semglee @0947  312 (H)  11 units Novolog 324 (H)  11 units Novolog 309 (H)  11 units Novolog 315 (H)  (H): Data is abnormally high  Latest Reference Range & Units 06/19/22 23:43 06/20/22 04:22 06/20/22 07:50  Glucose-Capillary 70 - 99 mg/dL 237 (H)  3 units Novolog 166 (H)  3 units Novolog 224 (H)  (H): Data is abnormally high     Admit:  Hyperosmolar hyperglycemic state  Cellulitis Candidal intertrigo with secondary bacterial infection Generalized weakness   Home DM Meds:  Metformin 1000 mg QD (NOT Taking)      Ozempic 1 mg qwk (NOT Taking)    Current Orders: Semglee 25 units Daily      Novolog Moderate Correction Scale/ SSI (0-15 units) Q4 hours    MD- Please consider:  1. Increase Semglee to 28 units Daily  2. Change Novolog SSI frequency to TID AC + HS  3. Start Novolog Meal Coverage: Novolog 6 units TID with meals HOLD if pt NPO HOLD if pt eats <50% meals  MD- Do you anticipate pt will need Insulin for home?    Addendum 12:45pm--Met w/ pt at bedside.  Pt told me he stopped taking his Ozempic about 1 month ago b/c he said he didn't see that he was losing any weight and thought it wasn't helping. Still has 6 doses at home. Has been taking the Metformin daily. Is willing to take insulin at home if needed to help with CBG control--Maybe a simple once daily regimen with basal insulin? His Last A1c was 6% per PCP record review. I  reviewed with him how to use the insulin pen and he is very familiar and seems competent with pen injections.  Verbally reviewed with pt how to use an insulin pen and pt able to verbalize how to place insulin pen needle on the pen, how to dial dose.  Asked pt to hold needle in skin after injection for 10 seconds.  Pt willing and agreeable to start insulin at home if needed.       --Will follow patient during hospitalization--  Ambrose Finland RN, MSN, CDCES Diabetes Coordinator Inpatient Glycemic Control Team Team Pager: [phone] (8a-5p)

## 2022-06-20 NOTE — Care Management Important Message (Signed)
Important Message  Patient Details  Name: James [name] MRN: [id number] Date of Birth: 02/09/1960   Medicare Important Message Given:  N/A - LOS <3 / Initial given by admissions     Olegario Messier [name] 06/20/2022, 11:13 AM

## 2022-06-21 ENCOUNTER — Other Ambulatory Visit: Payer: Self-pay | Admitting: *Deleted

## 2022-06-21 ENCOUNTER — Other Ambulatory Visit (HOSPITAL_COMMUNITY): Payer: Self-pay

## 2022-06-21 DIAGNOSIS — E1100 Type 2 diabetes mellitus with hyperosmolarity without nonketotic hyperglycemic-hyperosmolar coma (NKHHC): Secondary | ICD-10-CM | POA: Diagnosis not present

## 2022-06-21 LAB — BASIC METABOLIC PANEL
Anion gap: 10 (ref 5–15)
BUN: 10 mg/dL (ref 6–20)
CO2: 29 mmol/L (ref 22–32)
Calcium: 8.7 mg/dL — ABNORMAL LOW (ref 8.9–10.3)
Chloride: 98 mmol/L (ref 98–111)
Creatinine, Ser: 0.59 mg/dL — ABNORMAL LOW (ref 0.61–1.24)
GFR, Estimated: >60 mL/min (ref >60)
Glucose, Bld: 121 mg/dL — ABNORMAL HIGH (ref 70–99)
Potassium: 4.1 mmol/L (ref 3.5–5.1)
Sodium: 137 mmol/L (ref 135–145)

## 2022-06-21 LAB — GLUCOSE, CAPILLARY
Glucose-Capillary: 121 mg/dL — ABNORMAL HIGH (ref 70–99)
Glucose-Capillary: 180 mg/dL — ABNORMAL HIGH (ref 70–99)
Glucose-Capillary: 206 mg/dL — ABNORMAL HIGH (ref 70–99)

## 2022-06-21 LAB — HEMOGLOBIN A1C
Hgb A1c MFr Bld: 12.9 % — ABNORMAL HIGH (ref 4.8–5.6)
Mean Plasma Glucose: 324 mg/dL

## 2022-06-21 LAB — MISC LABCORP TEST (SEND OUT): Labcorp test code: 83935

## 2022-06-21 MED ORDER — FLUCONAZOLE 100 MG PO TABS: 100.0000 mg | ORAL_TABLET | Freq: Every day | ORAL | 0 refills | Status: AC

## 2022-06-21 MED ORDER — "PEN NEEDLES 3/16"" 31G X 5 MM MISC": 1.0000 | Freq: Three times a day (TID) | 0 refills | Status: AC

## 2022-06-21 MED ORDER — OZEMPIC (1 MG/DOSE) 4 MG/3ML ~~LOC~~ SOPN
PEN_INJECTOR | SUBCUTANEOUS | 0 refills | Status: AC
Start: 2022-06-21 — End: ?

## 2022-06-21 MED ORDER — LANTUS SOLOSTAR 100 UNIT/ML ~~LOC~~ SOPN: 25.0000 [IU] | PEN_INJECTOR | Freq: Every day | SUBCUTANEOUS | 0 refills | Status: DC

## 2022-06-21 MED ORDER — CEPHALEXIN 500 MG PO CAPS: 500.0000 mg | ORAL_CAPSULE | Freq: Four times a day (QID) | ORAL | 0 refills | Status: AC

## 2022-06-21 NOTE — Plan of Care (Signed)

## 2022-06-21 NOTE — TOC Benefit Eligibility Note (Signed)
Patient Product/process development scientist completed.    The patient is currently admitted and upon discharge could be taking Lantus pen.  The current 30 day co-pay is $0.00.   The patient is insured through [employer] Part [employer]   This test claim was processed through [facility]- copay amounts may vary at other pharmacies due to pharmacy/plan contracts, or as the patient moves through the different stages of their insurance plan.  Roland Earl, CPHT Pharmacy Patient Advocate Specialist [facility] Patient Advocate Team Direct Number: [phone]  Fax: [phone]

## 2022-06-21 NOTE — Discharge Summary (Signed)
Physician Discharge Summary  James [name] WGN:[id number] DOB: 07/18/1960 DOA: 06/17/2022  PCP: Smitty Cords, DO  Admit date: 06/17/2022 Discharge date: 06/21/2022  Admitted From: Home Disposition:  Home with home health  Recommendations for Outpatient Follow-up:  Follow up with PCP in 1-2 weeks   Home Health:Yes PT OT RN Equipment/Devices:None  Discharge Condition:Stable  CODE STATUS:FULL  Diet recommendation: Carb mod  Brief/Interim Summary: 62 y.o. 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 who was brought to the ED with a 2-day complaint of protracted weakness, shakiness and nonadherence with medication due to inability to perform ADLs, IADLs and help himself in any capacity at home where he lives alone.  Patient was declined ESI at his last pain clinic appointment on 5/8 due to concern for worsening weight gain and lately, due to his limitations, has been unable to help himself and get out of bed . He denies nausea, vomiting, fever or chills and denies abdominal pain or diarrhea.  He has no cough or shortness of breath or chest pain.  He denies visual disturbance or one-sided weakness numbness or tingling. He complains of increased severity of his usual musculoskeletal pain. ED course and data review: Vitals within normal limits.  Labs notable for glucose of 760 with normal anion gap, normal bicarb.  Sodium 122.  Beta hydroxybutyric acid 0.27 and venous pH 7.33.  pCO2 on VBG noted to be high at 72.  Urinalysis unremarkable.  WBC 12,000 with normal hemoglobin. EKG, personally reviewed and interpreted showing sinus at 78 with no concerning ST-T wave changes. Patient was given at 3 L NS bolus and started on an insulin infusion and given potassium repletion per Endo  tool. Hospitalist consulted for admission.     Discharge Diagnoses:  Principal Problem:   Hyperosmolar hyperglycemic state (HHS) (HCC) Active Problems:   Cellulitis   Candidal intertrigo   Chronic pain syndrome   Class 3 severe obesity with serious comorbidity and body mass index (BMI) of 50.0 to 59.9 in adult [facility])   Chronic, continuous use of opioids   Hyponatremia   Hypercapnia   DDD (degenerative disc disease), lumbar   Chronic use of opiate for therapeutic purpose   Chronic arthropathy   Mild persistent asthma without complication   Generalized weakness   Hyperosmolar hyperglycemic state (HHS) (HCC) Glycemic control improved.  Patient did have briefly have a elevated anion gap.  This is now closed.  Sugars have improved. Plan: At time of discharge will recommend initiation of Lantus 25 units daily.  Continue home metformin and Ozempic.  Hemoglobin A1c greater than 12.  Very poor control.  Patient will need regular follow-up with his primary care physician for further titration.  Educated on glycemic control and the importance of dietary adherence.  Patient expressed understanding.   Cellulitis Candidal intertrigo with secondary bacterial infection Improving at time of urgent.  Would recommend additional 4 days of fluconazole in addition to Keflex.  Prescribed at time of DC   Hypercapnia Mild intermittent asthma VBG with pCO2 of 72.  O2 sats on room air 92-93 Suspect underlying OSA/OHS DuoNebs as needed Might benefit from sleep study as outpatient    Hyponatremia Likely depressed in the setting of hyperglycemia   Class 3 severe obesity with serious comorbidity and body mass index (BMI) of 50.0 to 59.9 in adult [facility])  Complicating factor to overall prognosis and care   Chronic pain syndrome Chronic continuous use of opiates History of epidural steroid injections Multimodal pain control with home oxycodone and Lyrica    Generalized weakness Related to HHS and  chronic physical deconditioning PT consult, home health PT ordered  Discharge Instructions  Discharge Instructions     Amb Referral to Nutrition and Diabetic Education   Complete by: As directed    Diet - low sodium heart healthy   Complete by: As directed    Increase activity slowly   Complete by: As directed       Allergies as of 06/21/2022   No Known Allergies      Medication List     STOP taking these medications    Blood Pressure Monitoring Devi   Chantix Continuing Month Pak 1 MG tablet Generic drug: varenicline   guaiFENesin-codeine 100-10 MG/5ML syrup Commonly known as: ROBITUSSIN AC   naloxone 4 MG/0.1ML Liqd nasal spray kit Commonly known as: NARCAN       TAKE these medications    albuterol 108 (90 Base) MCG/ACT inhaler Commonly known as: VENTOLIN HFA INHALE 1 TO 2 PUFFS BY MOUTH EVERY 6 HOURS AS NEEDED FOR WHEEZING FOR SHORTNESS OF BREATH What changed: Another medication with the same name was removed. Continue taking this medication, and follow the directions you see here.   atorvastatin 40 MG tablet Commonly known as: Lipitor Take 1 tablet (40 mg total) by mouth daily.   cephALEXin 500 MG capsule Commonly known as: Keflex Take 1 capsule (500 mg total) by mouth 4 (four) times daily for 4 days.   fluconazole 100 MG tablet Commonly known as: DIFLUCAN Take 1 tablet (100 mg total) by mouth at bedtime for 4 days.   fluticasone-salmeterol 250-50 MCG/ACT Aepb Commonly known as: Advair Diskus Inhale 1 puff into the lungs in the morning and at bedtime.   furosemide 20 MG tablet Commonly known as: LASIX TAKE 1 TABLET BY MOUTH ONCE DAILY AS NEEDED FOR FLUID OR EDEMA   hydrochlorothiazide 25 MG tablet Commonly known as: HYDRODIURIL Take 1 tablet (25 mg total) by mouth daily.   Lantus SoloStar 100 UNIT/ML Solostar Pen Generic drug: insulin glargine Inject 25 Units into the skin daily.   losartan 25 MG tablet Commonly known as: Cozaar Take 1  tablet (25 mg total) by mouth daily.   metFORMIN 1000 MG tablet Commonly known as: GLUCOPHAGE TAKE 1 TABLET BY MOUTH WITH BREAKFAST   Oxycodone HCl 10 MG Tabs Take 1 tablet (10 mg total) by mouth in the morning and at bedtime. Must last 30 days.   Oxycodone HCl 10 MG Tabs Take 1 tablet (10 mg total) by mouth in the morning and at bedtime. Must last 30 days.   Oxycodone HCl 10 MG Tabs Take 1 tablet (10 mg total) by mouth in the morning and at bedtime. Must last 30 days. Start taking on: June 30, 2022   Oxycodone HCl 10 MG Tabs Take 1 tablet (10 mg total) by mouth in the morning and at bedtime. Must last 30 days. Start taking on: July 30, 2022   Ozempic (1 MG/DOSE) 4 MG/3ML Sopn Generic drug: Semaglutide (1 MG/DOSE) INJECT 1MG  INTO THE SKIN ONCE A WEEK   pantoprazole 40 MG tablet Commonly known as: PROTONIX Take 40 mg by mouth daily.   Pen Needles 3/16" 31G X 5 MM Misc 1 each by Does not apply route 4 (four) times daily -  before meals and at bedtime.  pregabalin 150 MG capsule Commonly known as: LYRICA TAKE 2 CAPSULES BY MOUTH IN THE MORNING AND 1 AT NIGHT        No Known Allergies  Consultations: None   Procedures/Studies: No results found.    Subjective: Seen and examined at the time of discharge.  Stable no distress.  Appropriate for discharge home.  Discharge Exam: Vitals:   06/20/22 2359 06/21/22 0744  BP: 114/67 134/80  Pulse: 64 67  Resp: 20 18  Temp: 98.5 F (36.9 C) 98.1 F (36.7 C)  SpO2: 95% 97%   Vitals:   06/20/22 0754 06/20/22 1544 06/20/22 2359 06/21/22 0744  BP: 132/86 106/71 114/67 134/80  Pulse: 69 69 64 67  Resp: 16 20 20 18   Temp: 98 F (36.7 C) 98.5 F (36.9 C) 98.5 F (36.9 C) 98.1 F (36.7 C)  TempSrc:    Oral  SpO2: 97% 95% 95% 97%  Weight:      Height:        General: Pt is alert, awake, not in acute distress Cardiovascular: RRR, S1/S2 +, no rubs, no gallops Respiratory: CTA bilaterally, no wheezing, no  rhonchi Abdominal: Soft, NT, ND, bowel sounds + Extremities: no edema, no cyanosis    The results of significant diagnostics from this hospitalization (including imaging, microbiology, ancillary and laboratory) are listed below for reference.     Microbiology: No results found for this or any previous visit (from the past 240 hour(s)).   Labs: BNP (last 3 results) No results for input(s): "BNP" in the last 8760 hours. Basic Metabolic Panel: Recent Labs  Lab 06/18/22 0601 06/18/22 1058 06/19/22 0339 06/20/22 0402 06/21/22 0421  NA 131* 131* 131* 138 137  K 4.3 4.4 4.4 3.9 4.1  CL 89* 93* 92* 100 98  CO2 30 29 29  32 29  GLUCOSE 381* 253* 243* 168* 121*  BUN 8 8 9 9 10   CREATININE 0.76 0.85 0.75 0.60* 0.59*  CALCIUM 8.8* 8.8* 8.4* 8.7* 8.7*   Liver Function Tests: Recent Labs  Lab 06/17/22 2109  AST 14*  ALT 9  ALKPHOS 135*  BILITOT 1.1  PROT 6.8  ALBUMIN 3.9   No results for input(s): "LIPASE", "AMYLASE" in the last 168 hours. No results for input(s): "AMMONIA" in the last 168 hours. CBC: Recent Labs  Lab 06/17/22 2109  WBC 12.3*  NEUTROABS 9.6*  HGB 16.5  HCT 50.1  MCV 87.7  PLT 294   Cardiac Enzymes: No results for input(s): "CKTOTAL", "CKMB", "CKMBINDEX", "TROPONINI" in the last 168 hours. BNP: Invalid input(s): "POCBNP" CBG: Recent Labs  Lab 06/20/22 1949 06/20/22 2351 06/21/22 0357 06/21/22 0807 06/21/22 1144  GLUCAP 388* 109* 121* 180* 206*   D-Dimer No results for input(s): "DDIMER" in the last 72 hours. Hgb A1c Recent Labs    06/20/22 0401  HGBA1C 12.9*   Lipid Profile No results for input(s): "CHOL", "HDL", "LDLCALC", "TRIG", "CHOLHDL", "LDLDIRECT" in the last 72 hours. Thyroid function studies No results for input(s): "TSH", "T4TOTAL", "T3FREE", "THYROIDAB" in the last 72 hours.  Invalid input(s): "FREET3" Anemia work up No results for input(s): "VITAMINB12", "FOLATE", "FERRITIN", "TIBC", "IRON", "RETICCTPCT" in the last 72  hours. Urinalysis    Component Value Date/Time   COLORURINE STRAW (A) 06/17/2022 2109   APPEARANCEUR CLEAR (A) 06/17/2022 2109   LABSPEC 1.026 06/17/2022 2109   PHURINE 6.0 06/17/2022 2109   GLUCOSEU >=500 (A) 06/17/2022 2109   HGBUR SMALL (A) 06/17/2022 2109   BILIRUBINUR NEGATIVE 06/17/2022 2109   KETONESUR NEGATIVE  06/17/2022 2109   PROTEINUR NEGATIVE 06/17/2022 2109   NITRITE NEGATIVE 06/17/2022 2109   LEUKOCYTESUR SMALL (A) 06/17/2022 2109   Sepsis Labs Recent Labs  Lab 06/17/22 2109  WBC 12.3*   Microbiology No results found for this or any previous visit (from the past 240 hour(s)).   Time coordinating discharge: Over 30 minutes  SIGNED:   Tresa Moore, MD  [facility] 06/21/2022, 11:51 AM Pager   If 7PM-7AM, please contact night-coverage

## 2022-06-21 NOTE — TOC Transition Note (Signed)
Transition of Care [facility]) - CM/SW Discharge Note   Patient Details  Name: James [name] MRN: [id number] Date of Birth: 03/07/1960  Transition of Care [facility]) CM/SW Contact:  Garret Reddish, RN Phone Number: 06/21/2022, 10:36 AM   Clinical Narrative:  Chart reviewed.  Noted that patient will be a discharge for today.    I have spoken with patient about the recommendations for [facility] services on discharge. Patient has no home health preference.  I have asked Elnita Maxwell with [facility] to accept home health referral.  Home Health services are for [facility] RN, PT and OT.   Patient reports that he has a 2 wheeled rolling walker at home.  Patient reports that he has an electric wheelchair at home that the wheel is not working properly.  The wheelchair company will be coming out to his home on 06/27/2022 to repair the electric wheelchair.    Patient reports that his brother will transport him home today.    I have informed staff nurse of the above information.      Final next level of care: Home w Home Health Services Barriers to Discharge: No Barriers Identified   Patient Goals and CMS Choice CMS Medicare.gov Compare Post Acute Care list provided to:: Patient Choice offered to / list presented to : Patient  Discharge Placement                      Patient and family notified of of transfer: 06/21/22  Discharge Plan and Services Additional resources added to the After Visit Summary for                            [facility] Arranged: RN, PT, OT [facility] Agency: [employer] [facility] Date [facility] Agency Contacted: 06/21/22 Time HH Agency Contacted: 1020 Representative spoke with at [facility] Agency: Becky Sax  Social Determinants of Health (SDOH) Interventions SDOH Screenings   Food Insecurity: No Food Insecurity (06/18/2022)  Housing: Low Risk  (06/18/2022)  Transportation Needs: No Transportation Needs (06/18/2022)  Utilities: Not At Risk (06/18/2022)  Alcohol Screen: Low Risk  (05/04/2022)   Depression (PHQ2-9): Low Risk  (05/25/2022)  Financial Resource Strain: Low Risk  (03/25/2022)  Physical Activity: Inactive (03/25/2022)  Social Connections: Socially Isolated (03/25/2022)  Stress: No Stress Concern Present (03/25/2022)  Tobacco Use: High Risk (06/17/2022)     Readmission Risk Interventions     No data to display

## 2022-06-21 NOTE — Care Management Important Message (Signed)
Important Message  Patient Details  Name: James [name] MRN: [id number] Date of Birth: Sep 24, 1960   Medicare Important Message Given:  N/A - LOS <3 / Initial given by admissions     James [name] 06/21/2022, 9:11 AM

## 2022-06-21 NOTE — Consult Note (Signed)
[facility] Customer service manager [facility]) Accountable Care Organization (ACO) [facility] Liaison Note  06/21/2022  James [name] Jan 29, 1960 [id number]  Location: [facility] RN Hospital Liaison met patient at bedside at [facility].  Insurance: [employer]   James [name] is a 62 y.o. male who is a Primary Care Patient of Althea Charon, Netta Neat, DO [facility]). The patient was screened for readmission hospitalization with noted low risk score for unplanned readmission risk with 1 IP in 6 months.  The patient was assessed for potential [facility]) Care Management service needs for post hospital transition for care coordination. Review of patient's electronic medical record reveals patient James [name]. Spoke with pt concerning [facility] services with nurse care coordinator for diabetes management along with any other available resources. Discussed pt's current A1C and increase risk if this condition is not managed. Pt agreed and receptive to a referral with [facility] RN care coordinator to assist with managing his diabetes. Pt also interested in a nutritionist/dietitian [facility] liaison informed pt to go through his provider's office for a possible referral for this assistance. Will make a referral for [facility] services. Pt will be discharged with [facility] services for RN/OT/PT. Pt lives alone with a supportive brother. Patient was given an appointment reminder card and 24 hour Nurse Advice Line magnet.   Plan: [facility] Liaison will continue to follow progress and disposition to asess for post hospital community care coordination/management needs.  Referral request for community care coordination: pending disposition.   [facility] Care Management/Population Health does not replace or interfere with any arrangements made by the Inpatient Transition of Care team.   For questions contact:   Elliot Cousin, RN, BSN [facility] Liaison [facility]   [facility]  Population Health Office Hours MTWF  8:00 am-6:00 pm Off on Thursday [phone] mobile [phone] [Office toll free line][facility] Office Hours are M-F 8:30 - 5 pm 24 hour nurse advise line [phone] Concierge  [name]

## 2022-06-23 ENCOUNTER — Telehealth: Payer: Self-pay | Admitting: *Deleted

## 2022-06-23 DIAGNOSIS — Z7984 Long term (current) use of oral hypoglycemic drugs: Secondary | ICD-10-CM | POA: Diagnosis not present

## 2022-06-23 DIAGNOSIS — L03115 Cellulitis of right lower limb: Secondary | ICD-10-CM | POA: Diagnosis not present

## 2022-06-23 DIAGNOSIS — Z7985 Long-term (current) use of injectable non-insulin antidiabetic drugs: Secondary | ICD-10-CM | POA: Diagnosis not present

## 2022-06-23 DIAGNOSIS — G894 Chronic pain syndrome: Secondary | ICD-10-CM | POA: Diagnosis not present

## 2022-06-23 DIAGNOSIS — J45909 Unspecified asthma, uncomplicated: Secondary | ICD-10-CM | POA: Diagnosis not present

## 2022-06-23 DIAGNOSIS — I10 Essential (primary) hypertension: Secondary | ICD-10-CM | POA: Diagnosis not present

## 2022-06-23 DIAGNOSIS — E1165 Type 2 diabetes mellitus with hyperglycemia: Secondary | ICD-10-CM | POA: Diagnosis not present

## 2022-06-23 DIAGNOSIS — M6281 Muscle weakness (generalized): Secondary | ICD-10-CM | POA: Diagnosis not present

## 2022-06-23 DIAGNOSIS — E1142 Type 2 diabetes mellitus with diabetic polyneuropathy: Secondary | ICD-10-CM | POA: Diagnosis not present

## 2022-06-23 DIAGNOSIS — Z9181 History of falling: Secondary | ICD-10-CM | POA: Diagnosis not present

## 2022-06-23 NOTE — Progress Notes (Signed)
  Care Coordination  Outreach Note  06/23/2022 Name: Jahlani Murrie MRN: [id number] DOB: 22-Nov-1960   Care Coordination Outreach Attempts: An unsuccessful telephone outreach was attempted today to offer the patient information about available care coordination services.  Follow Up Plan:  Additional outreach attempts will be made to offer the patient care coordination information and services.   Encounter Outcome:  No Answer  Christie Nottingham  Care Coordination Care Guide  Direct Dial: [phone]

## 2022-06-24 ENCOUNTER — Other Ambulatory Visit: Payer: Self-pay | Admitting: Family Medicine

## 2022-06-24 ENCOUNTER — Telehealth: Payer: Self-pay | Admitting: *Deleted

## 2022-06-24 DIAGNOSIS — E1169 Type 2 diabetes mellitus with other specified complication: Secondary | ICD-10-CM

## 2022-06-24 DIAGNOSIS — J4530 Mild persistent asthma, uncomplicated: Secondary | ICD-10-CM | POA: Diagnosis not present

## 2022-06-24 DIAGNOSIS — Z794 Long term (current) use of insulin: Secondary | ICD-10-CM

## 2022-06-24 NOTE — Telephone Encounter (Signed)
Requested Prescriptions  Pending Prescriptions Disp Refills   atorvastatin (LIPITOR) 40 MG tablet [Pharmacy Med Name: Atorvastatin Calcium 40 MG Oral Tablet] 90 tablet 0    Sig: Take 1 tablet by mouth once daily     Cardiovascular:  Antilipid - Statins Failed - 06/24/2022  6:52 AM      Failed - Lipid Panel in normal range within the last 12 months    Cholesterol  Date Value Ref Range Status  06/25/2021 124 <200 mg/dL Final   LDL Cholesterol (Calc)  Date Value Ref Range Status  06/25/2021 62 mg/dL (calc) Final    Comment:    Reference range: <100 . Desirable range <100 mg/dL for primary prevention;   <70 mg/dL for patients with CHD or diabetic patients  with > or = 2 CHD risk factors. Marland Kitchen LDL-C is now calculated using the Martin-Hopkins  calculation, which is a validated novel method providing  better accuracy than the Friedewald equation in the  estimation of LDL-C.  Horald Pollen et al. Lenox Ahr. 4098;119(14): 2061-2068  (http://education.Quest[employer].com/faq/FAQ164)    HDL  Date Value Ref Range Status  06/25/2021 43 > OR = 40 mg/dL Final   Triglycerides  Date Value Ref Range Status  06/25/2021 106 <150 mg/dL Final         Passed - Patient is not pregnant      Passed - Valid encounter within last 12 months    Recent Outpatient Visits           1 month ago Mild persistent asthma with acute exacerbation   [facility] [facility] [place], Netta Neat, DO   4 months ago Type 2 diabetes mellitus with other specified complication, with long-term current use of insulin [facility])   [place] [facility] [place], Netta Neat, DO   7 months ago Type 2 diabetes mellitus with other specified complication, with long-term current use of insulin (HCC)   [place] [facility] [place], Netta Neat, DO   12 months ago Annual physical exam   [place] [facility] Smitty Cords, DO   1 year ago Pain  and swelling of toe of left foot   [place] [facility] [place], Netta Neat, DO       Future Appointments             In 4 weeks Althea Charon, Netta Neat, DO [facility]             losartan (COZAAR) 25 MG tablet [Pharmacy Med Name: Losartan Potassium 25 MG Oral Tablet] 90 tablet 0    Sig: Take 1 tablet by mouth once daily     Cardiovascular:  Angiotensin Receptor Blockers Failed - 06/24/2022  6:52 AM      Failed - Cr in normal range and within 180 days    Creat  Date Value Ref Range Status  06/25/2021 0.67 (L) 0.70 - 1.30 mg/dL Final   Creatinine, Ser  Date Value Ref Range Status  06/21/2022 0.59 (L) 0.61 - 1.24 mg/dL Final         Passed - K in normal range and within 180 days    Potassium  Date Value Ref Range Status  06/21/2022 4.1 3.5 - 5.1 mmol/L Final         Passed - Patient is not pregnant      Passed - Last BP in normal range    BP Readings from Last 1  Encounters:  06/21/22 134/80         Passed - Valid encounter within last 6 months    Recent Outpatient Visits           1 month ago Mild persistent asthma with acute exacerbation   [place] [facility] [place], Netta Neat, DO   4 months ago Type 2 diabetes mellitus with other specified complication, with long-term current use of insulin [facility])   [place] [facility] [place], Netta Neat, DO   7 months ago Type 2 diabetes mellitus with other specified complication, with long-term current use of insulin [facility])   [place] [facility] Smitty Cords, DO   12 months ago Annual physical exam   [place] [facility] Smitty Cords, DO   1 year ago Pain and swelling of toe of left foot   [place] [facility] Althea Charon, Netta Neat, DO       Future Appointments             In 4 weeks Althea Charon, Netta Neat, DO [facility]

## 2022-06-24 NOTE — Transitions of Care (Post Inpatient/ED Visit) (Signed)
   06/24/2022  Name: James [name] MRN: [id number] DOB: Jul 19, 1960  Today's TOC FU Call Status: Today's TOC FU Call Status:: Unsuccessul Call (1st Attempt) Unsuccessful Call (1st Attempt) Date: 06/24/22  Attempted to reach the patient regarding the most recent Inpatient/ED visit.  Follow Up Plan: Additional outreach attempts will be made to reach the patient to complete the Transitions of Care (Post Inpatient/ED visit) call.   Gean Maidens BSN RN [facility] Care Management [phone]

## 2022-06-24 NOTE — Transitions of Care (Post Inpatient/ED Visit) (Signed)
   06/24/2022  Name: Marjorie Lussier MRN: [id number] DOB: 1960/04/26  Today's TOC FU Call Status: Today's TOC FU Call Status:: Unsuccessful Call (2nd Attempt) Unsuccessful Call (2nd Attempt) Date: 06/24/22  Attempted to reach the patient regarding the most recent Inpatient/ED visit.  Follow Up Plan: Additional outreach attempts will be made to reach the patient to complete the Transitions of Care (Post Inpatient/ED visit) call.   Gean Maidens BSN RN [facility] Care Management [phone]

## 2022-06-24 NOTE — Progress Notes (Unsigned)
  Care Coordination  Outreach Note  06/24/2022 Name: Keiandre Cygan MRN: [id number] DOB: May 18, 1960   Care Coordination Outreach Attempts: A second unsuccessful outreach was attempted today to offer the patient with information about available care coordination services.  Follow Up Plan:  Additional outreach attempts will be made to offer the patient care coordination information and services.   Encounter Outcome:  No Answer  Christie Nottingham  Care Coordination Care Guide  Direct Dial: [phone]

## 2022-06-27 ENCOUNTER — Telehealth: Payer: Self-pay | Admitting: *Deleted

## 2022-06-27 DIAGNOSIS — Z7985 Long-term (current) use of injectable non-insulin antidiabetic drugs: Secondary | ICD-10-CM | POA: Diagnosis not present

## 2022-06-27 DIAGNOSIS — J45909 Unspecified asthma, uncomplicated: Secondary | ICD-10-CM | POA: Diagnosis not present

## 2022-06-27 DIAGNOSIS — E1165 Type 2 diabetes mellitus with hyperglycemia: Secondary | ICD-10-CM | POA: Diagnosis not present

## 2022-06-27 DIAGNOSIS — I10 Essential (primary) hypertension: Secondary | ICD-10-CM | POA: Diagnosis not present

## 2022-06-27 DIAGNOSIS — Z7984 Long term (current) use of oral hypoglycemic drugs: Secondary | ICD-10-CM | POA: Diagnosis not present

## 2022-06-27 DIAGNOSIS — E1142 Type 2 diabetes mellitus with diabetic polyneuropathy: Secondary | ICD-10-CM | POA: Diagnosis not present

## 2022-06-27 DIAGNOSIS — Z9181 History of falling: Secondary | ICD-10-CM | POA: Diagnosis not present

## 2022-06-27 DIAGNOSIS — M6281 Muscle weakness (generalized): Secondary | ICD-10-CM | POA: Diagnosis not present

## 2022-06-27 DIAGNOSIS — L03115 Cellulitis of right lower limb: Secondary | ICD-10-CM | POA: Diagnosis not present

## 2022-06-27 DIAGNOSIS — G894 Chronic pain syndrome: Secondary | ICD-10-CM | POA: Diagnosis not present

## 2022-06-27 NOTE — Progress Notes (Signed)
  Care Coordination  Outreach Note  06/27/2022 Name: Elic Vencill MRN: [id number] DOB: Apr 12, 1960   Care Coordination Outreach Attempts: A third unsuccessful outreach was attempted today to offer the patient with information about available care coordination services.  Follow Up Plan:  No further outreach attempts will be made at this time. We have been unable to contact the patient to offer or enroll patient in care coordination services  Encounter Outcome:  No Answer  Christie Nottingham  Care Coordination Care Guide  Direct Dial: [phone]

## 2022-06-27 NOTE — Transitions of Care (Post Inpatient/ED Visit) (Signed)
   06/27/2022  Name: James [name] MRN: [id number] DOB: 01-17-1961  Today's TOC FU Call Status: Today's TOC FU Call Status:: Unsuccessful Call (3rd Attempt) Unsuccessful Call (3rd Attempt) Date: 06/27/22  Attempted to reach the patient regarding the most recent Inpatient/ED visit.  Follow Up Plan: No further outreach attempts will be made at this time. We have been unable to contact the patient.  Gean Maidens BSN RN [facility] Care Management [phone]

## 2022-06-28 ENCOUNTER — Telehealth: Payer: Self-pay

## 2022-06-28 DIAGNOSIS — I10 Essential (primary) hypertension: Secondary | ICD-10-CM | POA: Diagnosis not present

## 2022-06-28 DIAGNOSIS — J45909 Unspecified asthma, uncomplicated: Secondary | ICD-10-CM | POA: Diagnosis not present

## 2022-06-28 DIAGNOSIS — Z794 Long term (current) use of insulin: Secondary | ICD-10-CM

## 2022-06-28 DIAGNOSIS — L03115 Cellulitis of right lower limb: Secondary | ICD-10-CM | POA: Diagnosis not present

## 2022-06-28 DIAGNOSIS — E1142 Type 2 diabetes mellitus with diabetic polyneuropathy: Secondary | ICD-10-CM | POA: Diagnosis not present

## 2022-06-28 DIAGNOSIS — Z7984 Long term (current) use of oral hypoglycemic drugs: Secondary | ICD-10-CM | POA: Diagnosis not present

## 2022-06-28 DIAGNOSIS — Z7985 Long-term (current) use of injectable non-insulin antidiabetic drugs: Secondary | ICD-10-CM | POA: Diagnosis not present

## 2022-06-28 DIAGNOSIS — E1165 Type 2 diabetes mellitus with hyperglycemia: Secondary | ICD-10-CM | POA: Diagnosis not present

## 2022-06-28 DIAGNOSIS — Z9181 History of falling: Secondary | ICD-10-CM | POA: Diagnosis not present

## 2022-06-28 DIAGNOSIS — G894 Chronic pain syndrome: Secondary | ICD-10-CM | POA: Diagnosis not present

## 2022-06-28 DIAGNOSIS — M6281 Muscle weakness (generalized): Secondary | ICD-10-CM | POA: Diagnosis not present

## 2022-06-28 MED ORDER — ONETOUCH VERIO W/DEVICE KIT
PACK | 0 refills | Status: AC
Start: 2022-06-28 — End: ?

## 2022-06-28 MED ORDER — ONETOUCH VERIO VI STRP
ORAL_STRIP | 3 refills | Status: AC
Start: 2022-06-28 — End: ?

## 2022-06-28 MED ORDER — ONETOUCH DELICA LANCETS 33G MISC
3 refills | Status: AC
Start: 2022-06-28 — End: ?

## 2022-06-28 NOTE — Telephone Encounter (Signed)
Please notify them:  Regarding Pain, we would need more information - but he is followed by Pain Management and is on pain medication. They should be notified about his pain. Also, if it is sudden acute pain or due to any particular cause like injury or trauma or other illness, it may warrant evaluation at hospital ED if severe 10 out of 10 pain.  2. New Glucometer - OneTouch Verio sent to pharmacy. With test supplies.   Saralyn Pilar, DO [facility] 06/28/2022, 2:58 PM

## 2022-06-28 NOTE — Telephone Encounter (Signed)
I called and spoke with Victorino Dike from [place]. I notified her of what Dr. Cheron Schaumann recommended concerning the severe pain. She informed me that it was the patient chronic hip pain that is managed by pain management. She verbalize understanding that he needed to follow up with pain management concerning this pain.

## 2022-06-28 NOTE — Telephone Encounter (Signed)
Copied from CRM [id number]. Topic: General - Other >> Jun 28, 2022  2:08 PM [name] wrote: James [name] with [facility] health called to report that patient's pain is 10 out of 10. Patient is also in need of a new glucometer. Please advise.

## 2022-06-30 ENCOUNTER — Other Ambulatory Visit: Payer: Self-pay | Admitting: Family Medicine

## 2022-06-30 DIAGNOSIS — I10 Essential (primary) hypertension: Secondary | ICD-10-CM | POA: Diagnosis not present

## 2022-06-30 DIAGNOSIS — M6281 Muscle weakness (generalized): Secondary | ICD-10-CM | POA: Diagnosis not present

## 2022-06-30 DIAGNOSIS — Z7984 Long term (current) use of oral hypoglycemic drugs: Secondary | ICD-10-CM | POA: Diagnosis not present

## 2022-06-30 DIAGNOSIS — Z7985 Long-term (current) use of injectable non-insulin antidiabetic drugs: Secondary | ICD-10-CM | POA: Diagnosis not present

## 2022-06-30 DIAGNOSIS — E1165 Type 2 diabetes mellitus with hyperglycemia: Secondary | ICD-10-CM | POA: Diagnosis not present

## 2022-06-30 DIAGNOSIS — G894 Chronic pain syndrome: Secondary | ICD-10-CM | POA: Diagnosis not present

## 2022-06-30 DIAGNOSIS — E1142 Type 2 diabetes mellitus with diabetic polyneuropathy: Secondary | ICD-10-CM | POA: Diagnosis not present

## 2022-06-30 DIAGNOSIS — Z9181 History of falling: Secondary | ICD-10-CM | POA: Diagnosis not present

## 2022-06-30 DIAGNOSIS — L03115 Cellulitis of right lower limb: Secondary | ICD-10-CM | POA: Diagnosis not present

## 2022-06-30 DIAGNOSIS — M159 Polyosteoarthritis, unspecified: Secondary | ICD-10-CM

## 2022-06-30 DIAGNOSIS — J45909 Unspecified asthma, uncomplicated: Secondary | ICD-10-CM | POA: Diagnosis not present

## 2022-06-30 NOTE — Telephone Encounter (Signed)
Requested medication (s) are due for refill today: yes  Requested medication (s) are on the active medication list: yes  Last refill:  01/06/22 #270 1 refill  Future visit scheduled: yes in 3 weeks   Notes to clinic:  not delegated per protocol do you want to refill Rx?     Requested Prescriptions  Pending Prescriptions Disp Refills   pregabalin (LYRICA) 150 MG capsule [Pharmacy Med Name: Pregabalin 150 MG Oral Capsule] 270 capsule 0    Sig: TAKE 2 CAPSULES BY MOUTH IN THE MORNING AND 1 CAPSULE AT NIGHT     Not Delegated - Neurology:  Anticonvulsants - Controlled - pregabalin Failed - 06/30/2022 10:18 AM      Failed - This refill cannot be delegated      Failed - Cr in normal range and within 360 days    Creat  Date Value Ref Range Status  06/25/2021 0.67 (L) 0.70 - 1.30 mg/dL Final   Creatinine, Ser  Date Value Ref Range Status  06/21/2022 0.59 (L) 0.61 - 1.24 mg/dL Final         Passed - Completed PHQ-2 or PHQ-9 in the last 360 days      Passed - Valid encounter within last 12 months    Recent Outpatient Visits           1 month ago Mild persistent asthma with acute exacerbation   [place] [facility] Smitty Cords, DO   4 months ago Type 2 diabetes mellitus with other specified complication, with long-term current use of insulin [facility])   [place] [facility], Netta Neat, DO   7 months ago Type 2 diabetes mellitus with other specified complication, with long-term current use of insulin [facility])   [place] [facility] Smitty Cords, DO   1 year ago Annual physical exam   [place] [facility] Smitty Cords, DO   1 year ago Pain and swelling of toe of left foot   [place] [facility] Althea Charon, Netta Neat, DO       Future Appointments             In 3 weeks Althea Charon, Netta Neat, DO [facility] [facility],  [facility]

## 2022-07-04 ENCOUNTER — Telehealth: Payer: Self-pay

## 2022-07-04 DIAGNOSIS — E1165 Type 2 diabetes mellitus with hyperglycemia: Secondary | ICD-10-CM | POA: Diagnosis not present

## 2022-07-04 DIAGNOSIS — I10 Essential (primary) hypertension: Secondary | ICD-10-CM | POA: Diagnosis not present

## 2022-07-04 DIAGNOSIS — Z9181 History of falling: Secondary | ICD-10-CM | POA: Diagnosis not present

## 2022-07-04 DIAGNOSIS — M6281 Muscle weakness (generalized): Secondary | ICD-10-CM | POA: Diagnosis not present

## 2022-07-04 DIAGNOSIS — Z7985 Long-term (current) use of injectable non-insulin antidiabetic drugs: Secondary | ICD-10-CM | POA: Diagnosis not present

## 2022-07-04 DIAGNOSIS — G894 Chronic pain syndrome: Secondary | ICD-10-CM | POA: Diagnosis not present

## 2022-07-04 DIAGNOSIS — L03115 Cellulitis of right lower limb: Secondary | ICD-10-CM | POA: Diagnosis not present

## 2022-07-04 DIAGNOSIS — J45909 Unspecified asthma, uncomplicated: Secondary | ICD-10-CM | POA: Diagnosis not present

## 2022-07-04 DIAGNOSIS — E1142 Type 2 diabetes mellitus with diabetic polyneuropathy: Secondary | ICD-10-CM | POA: Diagnosis not present

## 2022-07-04 DIAGNOSIS — Z7984 Long term (current) use of oral hypoglycemic drugs: Secondary | ICD-10-CM | POA: Diagnosis not present

## 2022-07-04 NOTE — Telephone Encounter (Signed)
Copied from CRM [id number]. Topic: Referral - Request for Referral >> Jul 04, 2022  1:14 PM Alfred Levins wrote: Home Health Verbal Orders - Caller/Agency: [name] with [name] Number: [phone] Requesting OT Frequency:  1w6

## 2022-07-04 NOTE — Telephone Encounter (Signed)
Verbal given to proceed with therapy.

## 2022-07-04 NOTE — Telephone Encounter (Signed)
Okay to proceed w/ orders  Saralyn Pilar, DO [facility] 07/04/2022, 2:13 PM

## 2022-07-07 DIAGNOSIS — E1165 Type 2 diabetes mellitus with hyperglycemia: Secondary | ICD-10-CM | POA: Diagnosis not present

## 2022-07-07 DIAGNOSIS — Z9181 History of falling: Secondary | ICD-10-CM | POA: Diagnosis not present

## 2022-07-07 DIAGNOSIS — Z7984 Long term (current) use of oral hypoglycemic drugs: Secondary | ICD-10-CM | POA: Diagnosis not present

## 2022-07-07 DIAGNOSIS — Z7985 Long-term (current) use of injectable non-insulin antidiabetic drugs: Secondary | ICD-10-CM | POA: Diagnosis not present

## 2022-07-07 DIAGNOSIS — E1142 Type 2 diabetes mellitus with diabetic polyneuropathy: Secondary | ICD-10-CM | POA: Diagnosis not present

## 2022-07-07 DIAGNOSIS — J45909 Unspecified asthma, uncomplicated: Secondary | ICD-10-CM | POA: Diagnosis not present

## 2022-07-07 DIAGNOSIS — I10 Essential (primary) hypertension: Secondary | ICD-10-CM | POA: Diagnosis not present

## 2022-07-07 DIAGNOSIS — G894 Chronic pain syndrome: Secondary | ICD-10-CM | POA: Diagnosis not present

## 2022-07-07 DIAGNOSIS — M6281 Muscle weakness (generalized): Secondary | ICD-10-CM | POA: Diagnosis not present

## 2022-07-07 DIAGNOSIS — L03115 Cellulitis of right lower limb: Secondary | ICD-10-CM | POA: Diagnosis not present

## 2022-07-11 DIAGNOSIS — I10 Essential (primary) hypertension: Secondary | ICD-10-CM | POA: Diagnosis not present

## 2022-07-11 DIAGNOSIS — Z9181 History of falling: Secondary | ICD-10-CM | POA: Diagnosis not present

## 2022-07-11 DIAGNOSIS — Z7984 Long term (current) use of oral hypoglycemic drugs: Secondary | ICD-10-CM | POA: Diagnosis not present

## 2022-07-11 DIAGNOSIS — L03115 Cellulitis of right lower limb: Secondary | ICD-10-CM | POA: Diagnosis not present

## 2022-07-11 DIAGNOSIS — E1165 Type 2 diabetes mellitus with hyperglycemia: Secondary | ICD-10-CM | POA: Diagnosis not present

## 2022-07-11 DIAGNOSIS — M6281 Muscle weakness (generalized): Secondary | ICD-10-CM | POA: Diagnosis not present

## 2022-07-11 DIAGNOSIS — J45909 Unspecified asthma, uncomplicated: Secondary | ICD-10-CM | POA: Diagnosis not present

## 2022-07-11 DIAGNOSIS — Z7985 Long-term (current) use of injectable non-insulin antidiabetic drugs: Secondary | ICD-10-CM | POA: Diagnosis not present

## 2022-07-11 DIAGNOSIS — E1142 Type 2 diabetes mellitus with diabetic polyneuropathy: Secondary | ICD-10-CM | POA: Diagnosis not present

## 2022-07-11 DIAGNOSIS — G894 Chronic pain syndrome: Secondary | ICD-10-CM | POA: Diagnosis not present

## 2022-07-14 ENCOUNTER — Other Ambulatory Visit: Payer: Self-pay | Admitting: Family Medicine

## 2022-07-14 DIAGNOSIS — Z9181 History of falling: Secondary | ICD-10-CM | POA: Diagnosis not present

## 2022-07-14 DIAGNOSIS — I10 Essential (primary) hypertension: Secondary | ICD-10-CM | POA: Diagnosis not present

## 2022-07-14 DIAGNOSIS — M6281 Muscle weakness (generalized): Secondary | ICD-10-CM | POA: Diagnosis not present

## 2022-07-14 DIAGNOSIS — L03115 Cellulitis of right lower limb: Secondary | ICD-10-CM | POA: Diagnosis not present

## 2022-07-14 DIAGNOSIS — M159 Polyosteoarthritis, unspecified: Secondary | ICD-10-CM

## 2022-07-14 DIAGNOSIS — J45909 Unspecified asthma, uncomplicated: Secondary | ICD-10-CM | POA: Diagnosis not present

## 2022-07-14 DIAGNOSIS — Z7985 Long-term (current) use of injectable non-insulin antidiabetic drugs: Secondary | ICD-10-CM | POA: Diagnosis not present

## 2022-07-14 DIAGNOSIS — G894 Chronic pain syndrome: Secondary | ICD-10-CM

## 2022-07-14 DIAGNOSIS — E1142 Type 2 diabetes mellitus with diabetic polyneuropathy: Secondary | ICD-10-CM | POA: Diagnosis not present

## 2022-07-14 DIAGNOSIS — Z7984 Long term (current) use of oral hypoglycemic drugs: Secondary | ICD-10-CM | POA: Diagnosis not present

## 2022-07-14 DIAGNOSIS — E1165 Type 2 diabetes mellitus with hyperglycemia: Secondary | ICD-10-CM | POA: Diagnosis not present

## 2022-07-15 ENCOUNTER — Telehealth: Payer: Self-pay | Admitting: Family Medicine

## 2022-07-15 NOTE — Telephone Encounter (Signed)
Requested medications are due for refill today.  no  Requested medications are on the active medications list.  yes  Last refill. 07/01/2022 #270 0 rf  Future visit scheduled.   yes  Notes to clinic.  Refill/refusal not delegated.    Requested Prescriptions  Pending Prescriptions Disp Refills   pregabalin (LYRICA) 150 MG capsule [Pharmacy Med Name: Pregabalin 150 MG Oral Capsule] 270 capsule 0    Sig: TAKE 2 CAPSULES BY MOUTH IN THE MORNING AND 1 CAPSULE AT NIGHT     Not Delegated - Neurology:  Anticonvulsants - Controlled - pregabalin Failed - 07/14/2022  3:57 PM      Failed - This refill cannot be delegated      Failed - Cr in normal range and within 360 days    Creat  Date Value Ref Range Status  06/25/2021 0.67 (L) 0.70 - 1.30 mg/dL Final   Creatinine, Ser  Date Value Ref Range Status  06/21/2022 0.59 (L) 0.61 - 1.24 mg/dL Final         Passed - Completed PHQ-2 or PHQ-9 in the last 360 days      Passed - Valid encounter within last 12 months    Recent Outpatient Visits           2 months ago Mild persistent asthma with acute exacerbation   [place] [facility] Smitty Cords, DO   5 months ago Type 2 diabetes mellitus with other specified complication, with long-term current use of insulin [facility])   [place] [facility], Netta Neat, DO   8 months ago Type 2 diabetes mellitus with other specified complication, with long-term current use of insulin [facility])   [place] [facility] Smitty Cords, DO   1 year ago Annual physical exam   [place] [facility] Smitty Cords, DO   1 year ago Pain and swelling of toe of left foot   [place] [facility] Althea Charon, Netta Neat, DO       Future Appointments             In 1 week Althea Charon, Netta Neat, DO [place] [facility], [facility]

## 2022-07-15 NOTE — Telephone Encounter (Signed)
Noreene Larsson with [facility] called to report a missed PT visit for the patient.  Pt was not at home.

## 2022-07-18 DIAGNOSIS — Z9181 History of falling: Secondary | ICD-10-CM | POA: Diagnosis not present

## 2022-07-18 DIAGNOSIS — L03115 Cellulitis of right lower limb: Secondary | ICD-10-CM | POA: Diagnosis not present

## 2022-07-18 DIAGNOSIS — Z7985 Long-term (current) use of injectable non-insulin antidiabetic drugs: Secondary | ICD-10-CM | POA: Diagnosis not present

## 2022-07-18 DIAGNOSIS — I10 Essential (primary) hypertension: Secondary | ICD-10-CM | POA: Diagnosis not present

## 2022-07-18 DIAGNOSIS — J45909 Unspecified asthma, uncomplicated: Secondary | ICD-10-CM | POA: Diagnosis not present

## 2022-07-18 DIAGNOSIS — M6281 Muscle weakness (generalized): Secondary | ICD-10-CM | POA: Diagnosis not present

## 2022-07-18 DIAGNOSIS — G894 Chronic pain syndrome: Secondary | ICD-10-CM | POA: Diagnosis not present

## 2022-07-18 DIAGNOSIS — E1165 Type 2 diabetes mellitus with hyperglycemia: Secondary | ICD-10-CM | POA: Diagnosis not present

## 2022-07-18 DIAGNOSIS — E1142 Type 2 diabetes mellitus with diabetic polyneuropathy: Secondary | ICD-10-CM | POA: Diagnosis not present

## 2022-07-18 DIAGNOSIS — Z7984 Long term (current) use of oral hypoglycemic drugs: Secondary | ICD-10-CM | POA: Diagnosis not present

## 2022-07-19 DIAGNOSIS — M47816 Spondylosis without myelopathy or radiculopathy, lumbar region: Secondary | ICD-10-CM | POA: Diagnosis not present

## 2022-07-19 DIAGNOSIS — M170 Bilateral primary osteoarthritis of knee: Secondary | ICD-10-CM | POA: Diagnosis not present

## 2022-07-19 DIAGNOSIS — E1142 Type 2 diabetes mellitus with diabetic polyneuropathy: Secondary | ICD-10-CM | POA: Diagnosis not present

## 2022-07-20 LAB — OSMOLALITY: Osmolality: 297 mOsm/kg — ABNORMAL HIGH (ref 275–295)

## 2022-07-22 ENCOUNTER — Encounter: Payer: 59 | Admitting: Family Medicine

## 2022-07-24 DIAGNOSIS — J4530 Mild persistent asthma, uncomplicated: Secondary | ICD-10-CM | POA: Diagnosis not present

## 2022-07-26 DIAGNOSIS — Z79899 Other long term (current) drug therapy: Secondary | ICD-10-CM | POA: Diagnosis not present

## 2022-07-27 ENCOUNTER — Ambulatory Visit: Payer: 59 | Admitting: Dietician

## 2022-07-27 DIAGNOSIS — Z993 Dependence on wheelchair: Secondary | ICD-10-CM | POA: Diagnosis not present

## 2022-07-27 DIAGNOSIS — F17200 Nicotine dependence, unspecified, uncomplicated: Secondary | ICD-10-CM | POA: Diagnosis not present

## 2022-07-27 DIAGNOSIS — Z0289 Encounter for other administrative examinations: Secondary | ICD-10-CM | POA: Diagnosis not present

## 2022-07-27 DIAGNOSIS — Z1211 Encounter for screening for malignant neoplasm of colon: Secondary | ICD-10-CM | POA: Diagnosis not present

## 2022-07-28 DIAGNOSIS — Z7984 Long term (current) use of oral hypoglycemic drugs: Secondary | ICD-10-CM | POA: Diagnosis not present

## 2022-07-28 DIAGNOSIS — Z9181 History of falling: Secondary | ICD-10-CM | POA: Diagnosis not present

## 2022-07-28 DIAGNOSIS — G894 Chronic pain syndrome: Secondary | ICD-10-CM | POA: Diagnosis not present

## 2022-07-28 DIAGNOSIS — Z7985 Long-term (current) use of injectable non-insulin antidiabetic drugs: Secondary | ICD-10-CM | POA: Diagnosis not present

## 2022-07-28 DIAGNOSIS — E1142 Type 2 diabetes mellitus with diabetic polyneuropathy: Secondary | ICD-10-CM | POA: Diagnosis not present

## 2022-07-28 DIAGNOSIS — J45909 Unspecified asthma, uncomplicated: Secondary | ICD-10-CM | POA: Diagnosis not present

## 2022-07-28 DIAGNOSIS — L03115 Cellulitis of right lower limb: Secondary | ICD-10-CM | POA: Diagnosis not present

## 2022-07-28 DIAGNOSIS — E1165 Type 2 diabetes mellitus with hyperglycemia: Secondary | ICD-10-CM | POA: Diagnosis not present

## 2022-07-28 DIAGNOSIS — M6281 Muscle weakness (generalized): Secondary | ICD-10-CM | POA: Diagnosis not present

## 2022-07-28 DIAGNOSIS — I10 Essential (primary) hypertension: Secondary | ICD-10-CM | POA: Diagnosis not present

## 2022-07-29 DIAGNOSIS — M6281 Muscle weakness (generalized): Secondary | ICD-10-CM | POA: Diagnosis not present

## 2022-07-29 DIAGNOSIS — G894 Chronic pain syndrome: Secondary | ICD-10-CM | POA: Diagnosis not present

## 2022-07-29 DIAGNOSIS — I10 Essential (primary) hypertension: Secondary | ICD-10-CM | POA: Diagnosis not present

## 2022-07-29 DIAGNOSIS — E1165 Type 2 diabetes mellitus with hyperglycemia: Secondary | ICD-10-CM | POA: Diagnosis not present

## 2022-07-29 DIAGNOSIS — L03115 Cellulitis of right lower limb: Secondary | ICD-10-CM | POA: Diagnosis not present

## 2022-07-29 DIAGNOSIS — Z9181 History of falling: Secondary | ICD-10-CM | POA: Diagnosis not present

## 2022-07-29 DIAGNOSIS — J45909 Unspecified asthma, uncomplicated: Secondary | ICD-10-CM | POA: Diagnosis not present

## 2022-07-29 DIAGNOSIS — Z7985 Long-term (current) use of injectable non-insulin antidiabetic drugs: Secondary | ICD-10-CM | POA: Diagnosis not present

## 2022-07-29 DIAGNOSIS — Z7984 Long term (current) use of oral hypoglycemic drugs: Secondary | ICD-10-CM | POA: Diagnosis not present

## 2022-07-29 DIAGNOSIS — E1142 Type 2 diabetes mellitus with diabetic polyneuropathy: Secondary | ICD-10-CM | POA: Diagnosis not present

## 2022-08-02 DIAGNOSIS — J4530 Mild persistent asthma, uncomplicated: Secondary | ICD-10-CM | POA: Diagnosis not present

## 2022-08-02 DIAGNOSIS — I700 Atherosclerosis of aorta: Secondary | ICD-10-CM | POA: Diagnosis not present

## 2022-08-02 DIAGNOSIS — K635 Polyp of colon: Secondary | ICD-10-CM | POA: Diagnosis not present

## 2022-08-02 DIAGNOSIS — E1142 Type 2 diabetes mellitus with diabetic polyneuropathy: Secondary | ICD-10-CM | POA: Diagnosis not present

## 2022-08-02 DIAGNOSIS — K219 Gastro-esophageal reflux disease without esophagitis: Secondary | ICD-10-CM | POA: Diagnosis not present

## 2022-08-02 DIAGNOSIS — I77819 Aortic ectasia, unspecified site: Secondary | ICD-10-CM | POA: Diagnosis not present

## 2022-08-02 DIAGNOSIS — I10 Essential (primary) hypertension: Secondary | ICD-10-CM | POA: Diagnosis not present

## 2022-08-02 DIAGNOSIS — G894 Chronic pain syndrome: Secondary | ICD-10-CM | POA: Diagnosis not present

## 2022-08-02 DIAGNOSIS — E785 Hyperlipidemia, unspecified: Secondary | ICD-10-CM | POA: Diagnosis not present

## 2022-08-02 DIAGNOSIS — F17200 Nicotine dependence, unspecified, uncomplicated: Secondary | ICD-10-CM | POA: Diagnosis not present

## 2022-08-02 DIAGNOSIS — Z0289 Encounter for other administrative examinations: Secondary | ICD-10-CM | POA: Diagnosis not present

## 2022-08-02 DIAGNOSIS — M48061 Spinal stenosis, lumbar region without neurogenic claudication: Secondary | ICD-10-CM | POA: Diagnosis not present

## 2022-08-02 DIAGNOSIS — E1169 Type 2 diabetes mellitus with other specified complication: Secondary | ICD-10-CM | POA: Diagnosis not present

## 2022-08-02 DIAGNOSIS — Z741 Need for assistance with personal care: Secondary | ICD-10-CM | POA: Diagnosis not present

## 2022-08-02 DIAGNOSIS — E1159 Type 2 diabetes mellitus with other circulatory complications: Secondary | ICD-10-CM | POA: Diagnosis not present

## 2022-08-03 ENCOUNTER — Ambulatory Visit: Payer: Self-pay

## 2022-08-03 DIAGNOSIS — I10 Essential (primary) hypertension: Secondary | ICD-10-CM | POA: Diagnosis not present

## 2022-08-03 DIAGNOSIS — Z9181 History of falling: Secondary | ICD-10-CM | POA: Diagnosis not present

## 2022-08-03 DIAGNOSIS — M6281 Muscle weakness (generalized): Secondary | ICD-10-CM | POA: Diagnosis not present

## 2022-08-03 DIAGNOSIS — L03115 Cellulitis of right lower limb: Secondary | ICD-10-CM | POA: Diagnosis not present

## 2022-08-03 DIAGNOSIS — E1142 Type 2 diabetes mellitus with diabetic polyneuropathy: Secondary | ICD-10-CM | POA: Diagnosis not present

## 2022-08-03 DIAGNOSIS — E1165 Type 2 diabetes mellitus with hyperglycemia: Secondary | ICD-10-CM | POA: Diagnosis not present

## 2022-08-03 DIAGNOSIS — G894 Chronic pain syndrome: Secondary | ICD-10-CM | POA: Diagnosis not present

## 2022-08-03 DIAGNOSIS — J45909 Unspecified asthma, uncomplicated: Secondary | ICD-10-CM | POA: Diagnosis not present

## 2022-08-03 DIAGNOSIS — Z7984 Long term (current) use of oral hypoglycemic drugs: Secondary | ICD-10-CM | POA: Diagnosis not present

## 2022-08-03 DIAGNOSIS — Z7985 Long-term (current) use of injectable non-insulin antidiabetic drugs: Secondary | ICD-10-CM | POA: Diagnosis not present

## 2022-08-03 NOTE — Telephone Encounter (Signed)
I reviewed his concerns. He has now been scheduled w/ Pain Management on 08/15/22. Since that is 12 days away, my advice is to keep with that plan to proceed to their treatment on 7/29.  Saralyn Pilar, DO [facility] 08/03/2022, 5:58 PM

## 2022-08-03 NOTE — Telephone Encounter (Signed)
Reason for Disposition  Hip pain is a chronic symptom (recurrent or ongoing AND present > 4 weeks)    He is going to call his pain management doctor.   See notes for details.  Answer Assessment - Initial Assessment Questions 1. LOCATION and RADIATION: "Where is the pain located?"      I called pt back.   There was a message from [place] from [place] that pt was having severe hip pain. I have very bad right hip pain.   I can't hardly get up and down.   I've had hip pain for over 10 yrs but it's gotten worse.   I use an electric chair due to my hip.   The pain medicine is not working anymore. I usually go to pain management.   I've been out of commission for 2 months because my electric chair broke and then my phone broke.    The pain management doctor was giving me epidural shots that really helps.    I can't sit too long.  I can't stand.  I want to get back started on the epidural shots.  I was going to call the pain management doctor today since I got my phone fixed.    My level is 10/10 every day in my hip.   I'm going to call them today at 2:00 because that is after their lunch time.   I want to call the pain management first.  I know Dr. Althea Charon can't really do anything else plus when I signed that contract with the pain management clinic I can't get pain medication from Dr. Althea Charon.    I let him know if there was anything else we could help him with just give [place] a call back.   He thanked me for calling.    2. QUALITY: "What does the pain feel like?"  (e.g., sharp, dull, aching, burning)     Very bad pain in my right hip 3. SEVERITY: "How bad is the pain?" "What does it keep you from doing?"   (Scale 1-10; or mild, moderate, severe)   -  MILD (1-3): doesn't interfere with normal activities    -  MODERATE (4-7): interferes with normal activities (e.g., work or school) or awakens from sleep, limping    -  SEVERE (8-10): excruciating pain, unable to do any normal activities, unable to  walk     Severe   10/10 pain 4. ONSET: "When did the pain start?" "Does it come and go, or is it there all the time?"     This has been going on for over 10 years but lately it's gotten worse because I wasn't able to go to the pain management clinic.   My electric chair broke.  So I've been out of commission for the last 2 months. 5. WORK OR EXERCISE: "Has there been any recent work or exercise that involved this part of the body?"      No 6. CAUSE: "What do you think is causing the hip pain?"      Chronic 7. AGGRAVATING FACTORS: "What makes the hip pain worse?" (e.g., walking, climbing stairs, running)     Standing and sitting are painful 8. OTHER SYMPTOMS: "Do you have any other symptoms?" (e.g., back pain, pain shooting down leg,  fever, rash)     No  Protocols used: Hip Pain-A-AH  Chief Complaint: Severe right hip pain because his electric chair broke and he wasn't able to get to the pain management clinic for  his epidural shots for 2 months.   His chair is fixed so he is going to call today at 2:00 and get restarted. Symptoms: Severe right hip pain Frequency: On going for over 10 yrs but worse lately since he wasn't able to make it to the pain clinic. Pertinent Negatives: Patient denies N/A Disposition: [] ED /[] Urgent Care (no appt availability in office) / [] Appointment(In office/virtual)/ []  [facility] [place] [facility]/ [x] Home Care/ [] Refused Recommended Disposition /[] [place] [facility]/ []  Follow-up with PCP Additional Notes: I called him after getting a message from [place] from [facility] that he was having severe hip pain.  He is contacting pain management to get restarted on the epidural shots for his chronic hip pain.

## 2022-08-03 NOTE — Telephone Encounter (Addendum)
James [name] from [place] called to report pt stated in severe hip pain. Pt stated he  went to appt moving more than usual   Late entry: called pt and LM on Vm to call backto discuss pain.

## 2022-08-09 ENCOUNTER — Telehealth: Payer: Self-pay | Admitting: Family Medicine

## 2022-08-09 DIAGNOSIS — G894 Chronic pain syndrome: Secondary | ICD-10-CM | POA: Diagnosis not present

## 2022-08-09 DIAGNOSIS — E1142 Type 2 diabetes mellitus with diabetic polyneuropathy: Secondary | ICD-10-CM | POA: Diagnosis not present

## 2022-08-09 DIAGNOSIS — Z7984 Long term (current) use of oral hypoglycemic drugs: Secondary | ICD-10-CM | POA: Diagnosis not present

## 2022-08-09 DIAGNOSIS — L03115 Cellulitis of right lower limb: Secondary | ICD-10-CM | POA: Diagnosis not present

## 2022-08-09 DIAGNOSIS — Z9181 History of falling: Secondary | ICD-10-CM | POA: Diagnosis not present

## 2022-08-09 DIAGNOSIS — I10 Essential (primary) hypertension: Secondary | ICD-10-CM | POA: Diagnosis not present

## 2022-08-09 DIAGNOSIS — E1165 Type 2 diabetes mellitus with hyperglycemia: Secondary | ICD-10-CM | POA: Diagnosis not present

## 2022-08-09 DIAGNOSIS — Z7985 Long-term (current) use of injectable non-insulin antidiabetic drugs: Secondary | ICD-10-CM | POA: Diagnosis not present

## 2022-08-09 DIAGNOSIS — J45909 Unspecified asthma, uncomplicated: Secondary | ICD-10-CM | POA: Diagnosis not present

## 2022-08-09 DIAGNOSIS — M6281 Muscle weakness (generalized): Secondary | ICD-10-CM | POA: Diagnosis not present

## 2022-08-09 NOTE — Telephone Encounter (Signed)
Spoke with patient and says his pain level is 8/10 now.  He is following up with pain management.

## 2022-08-09 NOTE — Telephone Encounter (Signed)
Would recommend that he be seen for further evaluation.  If he is in 10 out of 10 pain, would recommend urgent care or ER evaluation

## 2022-08-09 NOTE — Telephone Encounter (Signed)
Pt is complaining with pain in his rt hip going down to his foot 10 out of 10 scale.  Just took Oxycontin.  Please advise  Call patient [phone]

## 2022-08-11 DIAGNOSIS — E1142 Type 2 diabetes mellitus with diabetic polyneuropathy: Secondary | ICD-10-CM | POA: Diagnosis not present

## 2022-08-11 DIAGNOSIS — G894 Chronic pain syndrome: Secondary | ICD-10-CM | POA: Diagnosis not present

## 2022-08-11 DIAGNOSIS — Z9181 History of falling: Secondary | ICD-10-CM | POA: Diagnosis not present

## 2022-08-11 DIAGNOSIS — L03115 Cellulitis of right lower limb: Secondary | ICD-10-CM | POA: Diagnosis not present

## 2022-08-11 DIAGNOSIS — I10 Essential (primary) hypertension: Secondary | ICD-10-CM | POA: Diagnosis not present

## 2022-08-11 DIAGNOSIS — E1165 Type 2 diabetes mellitus with hyperglycemia: Secondary | ICD-10-CM | POA: Diagnosis not present

## 2022-08-11 DIAGNOSIS — J45909 Unspecified asthma, uncomplicated: Secondary | ICD-10-CM | POA: Diagnosis not present

## 2022-08-11 DIAGNOSIS — Z7984 Long term (current) use of oral hypoglycemic drugs: Secondary | ICD-10-CM | POA: Diagnosis not present

## 2022-08-11 DIAGNOSIS — Z7985 Long-term (current) use of injectable non-insulin antidiabetic drugs: Secondary | ICD-10-CM | POA: Diagnosis not present

## 2022-08-11 DIAGNOSIS — M6281 Muscle weakness (generalized): Secondary | ICD-10-CM | POA: Diagnosis not present

## 2022-08-11 NOTE — Progress Notes (Signed)
(  08/15/2022) NO-SHOW to requested appointment to evaluate increased leg pain.  The patient apparently called just before his appointment to let us know that he was not going to make it since his ride did not pick him up on time.  Today I have instructed the staff to give him a call before rescheduling and to remind him of our last conversation that we would not be performing any more procedures until he has lost a significant amount of weight.  Procedure notes have documented a significant difficulty getting the patient in and out of the procedure table, secondary to his body habitus and weight (402 Lbs.).

## 2022-08-15 ENCOUNTER — Ambulatory Visit (HOSPITAL_BASED_OUTPATIENT_CLINIC_OR_DEPARTMENT_OTHER): Payer: 59 | Admitting: Pain Medicine

## 2022-08-15 DIAGNOSIS — M5136 Other intervertebral disc degeneration, lumbar region: Secondary | ICD-10-CM

## 2022-08-15 DIAGNOSIS — M5416 Radiculopathy, lumbar region: Secondary | ICD-10-CM

## 2022-08-15 DIAGNOSIS — Z6841 Body Mass Index (BMI) 40.0 and over, adult: Secondary | ICD-10-CM

## 2022-08-15 DIAGNOSIS — Z91199 Patient's noncompliance with other medical treatment and regimen due to unspecified reason: Secondary | ICD-10-CM

## 2022-08-15 DIAGNOSIS — M79651 Pain in right thigh: Secondary | ICD-10-CM

## 2022-08-15 DIAGNOSIS — R937 Abnormal findings on diagnostic imaging of other parts of musculoskeletal system: Secondary | ICD-10-CM

## 2022-08-15 DIAGNOSIS — G8929 Other chronic pain: Secondary | ICD-10-CM

## 2022-08-24 ENCOUNTER — Encounter: Payer: 59 | Admitting: Pain Medicine

## 2022-08-28 NOTE — Progress Notes (Unsigned)
PROVIDER NOTE: Information contained herein reflects review and annotations entered in association with encounter. Interpretation of such information and data should be left to medically-trained personnel. Information provided to patient can be located elsewhere in the medical record under "Patient Instructions". Document created using STT-dictation technology, any transcriptional errors that may result from process are unintentional.    Patient: James [name]  Service Category: E/M  Provider: Oswaldo Done, MD  DOB: May 11, 1960  DOS: 08/29/2022  Referring Provider: Saralyn Pilar *  MRN: [id number]  Specialty: Interventional Pain Management  PCP: Smitty Cords, DO  Type: Established Patient  Setting: Ambulatory outpatient    Location: Office  Delivery: Face-to-face     HPI  [name], a 62 y.o. year old male, is here today because of his No primary diagnosis found.. Mr. [place] primary complain today is No chief complaint on file.  Pertinent problems: Mr. Fronda has Chronic pain syndrome; DDD (degenerative disc disease), lumbar; Primary osteoarthritis involving multiple joints; Weakness of lower extremities (Bilateral); Chronic lower extremity pain (1ry area of Pain) (Bilateral); Chronic low back pain (4th area of Pain) (Bilateral) w/ sciatica (Bilateral); Chronic shoulder pain (5th area of Pain) (Left); Chronic hip pain (2ry area of Pain) (Bilateral); Chronic knee pain (3ry area of Pain) (Bilateral); Osteoarthritis of hips (Bilateral); Osteoarthritis of hip (Left); Osteoarthritis of hip (Right); Osteoarthritis of knee (Left); Lumbosacral radiculopathy at L5 (Left); Diabetic neuropathy (HCC); Osteoarthritis of AC (acromioclavicular) joint (Left); Osteoarthritis of shoulder (Left); Rotator cuff arthropathy of shoulder (Left); Lumbosacral facet hypertrophy (L4-5, L5-S1); Lumbar facet syndrome; Abnormal MRI, hip (04/21/2021); Osteoarthritis of knees (Bilateral)  (L>R); Abnormal EMG (electromyogram) (03/16/2021); Polyneuropathy, peripheral sensorimotor axonal; Diabetic sensorimotor polyneuropathy (HCC); Abnormal MRI, lumbar spine (05/21/2021); Chronic arthropathy; 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 Pain Assessment: Severity of   is reported as a  /10. Location:    / . Onset:  . Quality:  . Timing:  . Modifying factor(s):  Marland Kitchen Vitals:  vitals were not taken for this visit.  BMI: Estimated body mass index is 54.52 kg/m as calculated from the following:   Height as of 06/18/22: 6' (1.829 m).   Weight as of 06/17/22: 402 lb (182.3 kg). Last encounter: 08/15/2022. Last procedure: 03/01/2022.  Reason for encounter:  *** . ***  Pharmacotherapy Assessment  Analgesic: Oxycodone/APAP 10/325 (# 60/month), 1 tab p.o. twice daily (last filled on 02/03/2021) Needs to loose an average of 2.75 lbs/mo to be considered for opioid pharmacotherapy.  MME/day: 30 mg/day   Monitoring: [place] PMP: PDMP reviewed during this encounter.       Pharmacotherapy: No side-effects or adverse reactions reported. Compliance: No problems identified. Effectiveness: Clinically acceptable.  No notes on file  No results found for: "CBDTHCR" No results found for: "D8THCCBX" No results found for: "D9THCCBX"  UDS:  Summary  Date Value Ref Range Status  02/16/2022 Note  Final    Comment:    ==================================================================== [employer] Select 13 (MW) ==================================================================== Test                              Result       Flag       Units  Drug Present and Declared for Prescription Verification   Oxycodone                      2223         EXPECTED   ng/mg creat   Oxymorphone                    1208         EXPECTED   ng/mg creat   Noroxycodone                   3040         EXPECTED   ng/mg creat   Noroxymorphone                 475          EXPECTED   ng/mg creat    Sources of oxycodone are scheduled prescription medications.    Oxymorphone, noroxycodone, and noroxymorphone are expected    metabolites of oxycodone. Oxymorphone is also available as a    scheduled prescription medication.  ==================================================================== Test                      Result    Flag   Units      Ref Range   Creatinine              93               mg/dL      >=16 ==================================================================== Declared Medications:  The flagging and interpretation on this report are based on the  following declared medications.  Unexpected results may arise from  inaccuracies in the declared medications.   **Note: The testing scope of this panel includes these medications:   Oxycodone   **Note: The testing scope of this panel does not include the  following reported medications:   Albuterol  Atorvastatin  Dapagliflozin (Farxiga)  Fluticasone (Flovent HFA)  Furosemide (Lasix)  Hydrochlorothiazide  Insulin Evaristo Bury)  Liraglutide (Victoza)  Losartan (Cozaar)  Metformin  Naloxone (Narcan)  Pantoprazole (Protonix)  Pregabalin (Lyrica)  Semaglutide (Ozempic)  Varenicline (Chantix) ==================================================================== For clinical consultation, please call [phone]. ====================================================================       ROS  Constitutional: Denies any fever or chills Gastrointestinal: No reported hemesis, hematochezia, vomiting, or acute GI distress Musculoskeletal: Denies any acute  onset joint swelling, redness, loss of ROM, or weakness Neurological: No reported episodes of acute onset apraxia, aphasia, dysarthria, agnosia, amnesia, paralysis, loss of coordination, or loss of consciousness  Medication Review  OneTouch Delica Lancets 33G, OneTouch Verio, Oxycodone HCl, Pen Needles 3/16", Semaglutide (1 MG/DOSE), albuterol, atorvastatin, fluticasone-salmeterol, furosemide, glucose blood, hydrochlorothiazide, insulin glargine, losartan, metFORMIN, pantoprazole, and pregabalin  History Review  Allergy: Mr. Shamah has No Known Allergies. Drug: Mr. Silverstone  reports no history of drug use. Alcohol:  reports no history of alcohol use. Tobacco:  reports that he has been smoking cigarettes. He has a 2.5 pack-year smoking history. He has never used smokeless tobacco. Social: Mr. Geissler  reports that he has been smoking cigarettes. He has a 2.5 pack-year smoking history. He has never used smokeless tobacco. He reports that he does not drink alcohol and does not use drugs. Medical:  has a past medical history of Arthritis, Diabetes mellitus without complication (HCC), Hyperlipidemia, and Hypertension. Surgical: Mr. Root  has a past surgical history that includes Colonoscopy with propofol; Appendectomy; and Colonoscopy with propofol (N/A, 04/23/2021). Family: family history includes Arthritis  in his father and mother; Heart disease in his mother.  Laboratory Chemistry Profile   Renal Lab Results  Component Value Date   BUN 10 06/21/2022   CREATININE 0.59 (L) 06/21/2022   BCR 18 06/25/2021   GFRNONAA >60 06/21/2022    Hepatic Lab Results  Component Value Date   AST 14 (L) 06/17/2022   ALT 9 06/17/2022   ALBUMIN 3.9 06/17/2022   ALKPHOS 135 (H) 06/17/2022    Electrolytes Lab Results  Component Value Date   NA 137 06/21/2022   K 4.1 06/21/2022   CL 98 06/21/2022   CALCIUM 8.7 (L) 06/21/2022   MG 1.8 02/15/2021    Bone Lab Results  Component Value Date    25OHVITD1 13 (L) 02/15/2021   25OHVITD2 <1.0 02/15/2021   25OHVITD3 12 02/15/2021    Inflammation (CRP: Acute Phase) (ESR: Chronic Phase) Lab Results  Component Value Date   CRP 29 (H) 02/15/2021   ESRSEDRATE 39 (H) 02/15/2021         Note: Above Lab results reviewed.  Recent Imaging Review  DG PAIN CLINIC C-ARM 1-60 MIN NO REPORT Fluoro was used, but no Radiologist interpretation will be provided.  Please refer to "NOTES" tab for provider progress note. Note: Reviewed        Physical Exam  General appearance: Well nourished, well developed, and well hydrated. In no apparent acute distress Mental status: Alert, oriented x 3 (person, place, & time)       Respiratory: No evidence of acute respiratory distress Eyes: PERLA Vitals: There were no vitals taken for this visit. BMI: Estimated body mass index is 54.52 kg/m as calculated from the following:   Height as of 06/18/22: 6' (1.829 m).   Weight as of 06/17/22: 402 lb (182.3 kg). Ideal: Patient weight not recorded  Assessment   Diagnosis Status  No diagnosis found. Controlled Controlled Controlled   Updated Problems: No problems updated.  Plan of Care  Problem-specific:  No problem-specific Assessment & Plan notes found for this encounter.  Mr. Decorius Outwater has a current medication list which includes the following long-term medication(s): albuterol, atorvastatin, fluticasone-salmeterol, furosemide, hydrochlorothiazide, lantus solostar, losartan, metformin, oxycodone hcl, oxycodone hcl, oxycodone hcl, oxycodone hcl, pantoprazole, and pregabalin.  Pharmacotherapy (Medications Ordered): No orders of the defined types were placed in this encounter.  Orders:  No orders of the defined types were placed in this encounter.  Follow-up plan:   No follow-ups on file.      Interventional Therapies  Risk Factors  Considerations:   Goal: BMI down to 30 kg/m (Goal: 220 lbs for his height). Must lose 2.75 lbs/month.  (03/01/2022 - 315 Lbs.)  Class 3 MO  SOBOE  T2DM  BA  HTN   Planned  Pending:   NO MORE PROCEDURES UNTIL BMI IS <35 (very hard to move onto bed)   Under consideration:   NO MORE PROCEDURES UNTIL BMI IS <35 (very hard to move onto bed)   Completed:   Therapeutic right L2-3 LESI x2 (03/01/2022) ( Diagnostic/therapeutic right L3-4 LESI x1 (11/04/21) (100/100/25/25) (100% of LBP)  Diagnostic/therapeutic right L4-5 LESI x2 (11/30/2021) (100/100/100/80)  Toradol/Norflex IM 60/60 mg (03/29/2021)  Referral to orthopedic surgery for evaluation of knees and hip (03/29/2021)  Referral to medical weight management (02/15/2021)  Referral to bariatric surgery (02/15/2021)    Completed by other providers:   [facility]  EMG/PNCV (LE) by Theora Master, MD [facility] neurology) (03/16/2021) (chronic, severe sensorimotor polyneuropathy)   Therapeutic  Palliative (PRN) options:   None established       Recent Visits No visits were found meeting these conditions. Showing recent visits within past 90 days and meeting all other requirements Future Appointments Date Type Provider Dept  08/29/22 Appointment Delano Metz, MD [facility]  Showing future appointments within next 90 days and meeting all other requirements  I discussed the assessment and treatment plan with the patient. The patient was provided an opportunity to ask questions and all were answered. The patient agreed with the plan and demonstrated an understanding of the instructions.  Patient advised to call back or seek an in-person evaluation if the symptoms or condition worsens.  Duration of encounter: *** minutes.  Total time on encounter, as per AMA guidelines included both the face-to-face and non-face-to-face time personally spent by the physician and/or other qualified health care professional(s) on the day of the encounter (includes time in activities that require the physician or  other qualified health care professional and does not include time in activities normally performed by clinical staff). Physician's time may include the following activities when performed: Preparing to see the patient (e.g., pre-charting review of records, searching for previously ordered imaging, lab work, and nerve conduction tests) Review of prior analgesic pharmacotherapies. Reviewing PMP Interpreting ordered tests (e.g., lab work, imaging, nerve conduction tests) Performing post-procedure evaluations, including interpretation of diagnostic procedures Obtaining and/or reviewing separately obtained history Performing a medically appropriate examination and/or evaluation Counseling and educating the patient/family/caregiver Ordering medications, tests, or procedures Referring and communicating with other health care professionals (when not separately reported) Documenting clinical information in the electronic or other health record Independently interpreting results (not separately reported) and communicating results to the patient/ family/caregiver Care coordination (not separately reported)  Note by: Oswaldo Done, MD Date: 08/29/2022; Time: 8:52 AM

## 2022-08-29 ENCOUNTER — Ambulatory Visit: Payer: 59 | Attending: Pain Medicine | Admitting: Pain Medicine

## 2022-08-29 ENCOUNTER — Encounter: Payer: Self-pay | Admitting: Pain Medicine

## 2022-08-29 VITALS — BP 152/99 | HR 84 | Temp 98.2°F | Ht 72.0 in | Wt 339.5 lb

## 2022-08-29 DIAGNOSIS — M5136 Other intervertebral disc degeneration, lumbar region: Secondary | ICD-10-CM | POA: Diagnosis present

## 2022-08-29 DIAGNOSIS — E1169 Type 2 diabetes mellitus with other specified complication: Secondary | ICD-10-CM | POA: Diagnosis present

## 2022-08-29 DIAGNOSIS — M5442 Lumbago with sciatica, left side: Secondary | ICD-10-CM | POA: Insufficient documentation

## 2022-08-29 DIAGNOSIS — M25552 Pain in left hip: Secondary | ICD-10-CM | POA: Insufficient documentation

## 2022-08-29 DIAGNOSIS — M47816 Spondylosis without myelopathy or radiculopathy, lumbar region: Secondary | ICD-10-CM | POA: Insufficient documentation

## 2022-08-29 DIAGNOSIS — G894 Chronic pain syndrome: Secondary | ICD-10-CM | POA: Insufficient documentation

## 2022-08-29 DIAGNOSIS — M25562 Pain in left knee: Secondary | ICD-10-CM | POA: Diagnosis present

## 2022-08-29 DIAGNOSIS — M159 Polyosteoarthritis, unspecified: Secondary | ICD-10-CM | POA: Diagnosis present

## 2022-08-29 DIAGNOSIS — M5441 Lumbago with sciatica, right side: Secondary | ICD-10-CM | POA: Insufficient documentation

## 2022-08-29 DIAGNOSIS — G8929 Other chronic pain: Secondary | ICD-10-CM | POA: Diagnosis present

## 2022-08-29 DIAGNOSIS — M25551 Pain in right hip: Secondary | ICD-10-CM | POA: Insufficient documentation

## 2022-08-29 DIAGNOSIS — Z79899 Other long term (current) drug therapy: Secondary | ICD-10-CM | POA: Diagnosis present

## 2022-08-29 DIAGNOSIS — Z6841 Body Mass Index (BMI) 40.0 and over, adult: Secondary | ICD-10-CM | POA: Diagnosis present

## 2022-08-29 DIAGNOSIS — M25561 Pain in right knee: Secondary | ICD-10-CM | POA: Insufficient documentation

## 2022-08-29 DIAGNOSIS — Z79891 Long term (current) use of opiate analgesic: Secondary | ICD-10-CM | POA: Diagnosis present

## 2022-08-29 DIAGNOSIS — M79605 Pain in left leg: Secondary | ICD-10-CM | POA: Diagnosis not present

## 2022-08-29 DIAGNOSIS — M25512 Pain in left shoulder: Secondary | ICD-10-CM | POA: Diagnosis present

## 2022-08-29 DIAGNOSIS — M129 Arthropathy, unspecified: Secondary | ICD-10-CM | POA: Diagnosis present

## 2022-08-29 DIAGNOSIS — M79604 Pain in right leg: Secondary | ICD-10-CM | POA: Insufficient documentation

## 2022-08-29 MED ORDER — OXYCODONE HCL 10 MG PO TABS
10.0000 mg | ORAL_TABLET | Freq: Two times a day (BID) | ORAL | 0 refills | Status: DC
Start: 2022-10-28 — End: 2022-09-29

## 2022-08-29 MED ORDER — KETOROLAC TROMETHAMINE 60 MG/2ML IM SOLN
60.0000 mg | Freq: Once | INTRAMUSCULAR | Status: AC
  Administered 2022-08-29: 60 mg via INTRAMUSCULAR
  Filled 2022-08-29: qty 2

## 2022-08-29 MED ORDER — OXYCODONE HCL 10 MG PO TABS
10.0000 mg | ORAL_TABLET | Freq: Two times a day (BID) | ORAL | 0 refills | Status: DC
Start: 2022-08-29 — End: 2022-09-29

## 2022-08-29 MED ORDER — OXYCODONE HCL 10 MG PO TABS
10.0000 mg | ORAL_TABLET | Freq: Two times a day (BID) | ORAL | 0 refills | Status: DC
Start: 2022-09-28 — End: 2022-09-29

## 2022-08-29 MED ORDER — NALOXONE HCL 4 MG/0.1ML NA LIQD
1.0000 | NASAL | 0 refills | Status: AC | PRN
Start: 2022-08-29 — End: 2023-08-29

## 2022-08-29 MED ORDER — METHOCARBAMOL 1000 MG/10ML IJ SOLN
200.0000 mg | Freq: Once | INTRAMUSCULAR | Status: AC
  Administered 2022-08-29: 200 mg via INTRAMUSCULAR
  Filled 2022-08-29: qty 10

## 2022-08-29 NOTE — Progress Notes (Signed)
Safety precautions to be maintained throughout the outpatient stay will include: orient to surroundings, keep bed in low position, maintain call bell within reach at all times, provide assistance with transfer out of bed and ambulation.   Nursing Pain Medication Assessment:  Safety precautions to be maintained throughout the outpatient stay will include: orient to surroundings, keep bed in low position, maintain call bell within reach at all times, provide assistance with transfer out of bed and ambulation.  Medication Inspection Compliance: Pill count conducted under aseptic conditions, in front of the patient. Neither the pills nor the bottle was removed from the patient's sight at any time. Once count was completed pills were immediately returned to the patient in their original bottle.  Medication: Oxycodone IR Pill/Patch Count:  0 of 60 pills remain Pill/Patch Appearance: Markings consistent with prescribed medication Bottle Appearance: Standard pharmacy container. Clearly labeled. Filled Date: 7 / 13 / 2024 Last Medication intake:  Yesterday

## 2022-08-29 NOTE — Patient Instructions (Addendum)
_________________________________________________________________________________________  Body mass index (BMI) Weight Management Required  URGENT: Dear [name] your weight has been found to be adversely affecting your health. Aggressive action is immediately required. Talk to your primary care physician.  Body mass index (BMI) is a common tool for deciding whether a person has an appropriate body weight.  It measures a persons weight in relation to their height.   According to the [facility] of health (NIH): A BMI of less than 18.5 means that a person is underweight. A BMI of between 18.5 and 24.9 is ideal. A BMI of between 25 and 29.9 is overweight. A BMI over 30 indicates obesity.  Body Mass Index (BMI) Classification BMI level (kg/m2) Category Associated incidence of chronic pain  <18  Underweight   18.5-24.9 Ideal body weight   25-29.9 Overweight  20%  30-34.9 Obese (Class I)  68%  35-39.9 Severe obesity (Class II)  136%  >40 Extreme obesity (Class III)  254%   Your current Estimated body mass index is 36.89 kg/m as calculated from the following:   Height as of this encounter: 6' (1.829 m).   Weight as of this encounter: 272 lb (123.4 kg).  Morbidly Obese Classification: You will be considered to be "Morbidly Obese" if your BMI is above 30 and you have one or more of the following conditions caused or associated to obesity: 1.    Type 2 Diabetes (Leading to cardiovascular diseases (CVD), stroke, peripheral vascular diseases (PVD), retinopathy, nephropathy, and neuropathy) 2.    Cardiovascular Disease (High Blood Pressure; Congestive Heart Failure; High Cholesterol; Coronary Artery Disease; Angina; Arrhythmias, Dysrhythmias, or Heart Attacks) 3.    Breathing problems (Asthma; obesity-hypoventilation syndrome; obstructive sleep apnea; chronic inflammatory airway disease; reactive airway disease; or shortness of breath) 4.    Chronic kidney disease 5.    Liver  disease (nonalcoholic fatty liver disease) 6.    High blood pressure 7.    Acid reflux (gastroesophageal reflux disease; heartburn) 8.    Osteoarthritis (OA) (affecting the hip(s), the knee(s) and/or the lower back) 9.    Low back pain (Lumbar Facet Syndrome; and/or Degenerative Disc Disease) 10.  Hip pain (Osteoarthritis of hip) (For every 1 lbs of added body weight, there is a 2 lbs increase in pressure inside of each hip articulation. 1:2 mechanical relationship) 11.  Knee pain (Osteoarthritis of knee) (For every 1 lbs of added body weight, there is a 4 lbs increase in pressure inside of each knee articulation. 1:4 mechanical relationship) (patients with a BMI>30 kg/m2 were 6.8 times more likely to develop knee OA than normal-weight individuals) 12.  Cancer: Epidemiological studies have shown that obesity is a risk factor for: post-menopausal breast cancer; cancers of the endometrium, colon and kidney cancer; malignant adenomas of the oesophagus. Obese subjects have an approximately 1.5-3.5-fold increased risk of developing these cancers compared with normal-weight subjects, and it has been estimated that between 15 and 45% of these cancers can be attributed to overweight. More recent studies suggest that obesity may also increase the risk of other types of cancer, including pancreatic, hepatic and gallbladder cancer. (Ref: Obesity and cancer. Pischon T, Nthlings U, [employer] H. Proc Nutr Soc. 2008 May;67(2):128-45. doi: 10.1017/[id number].) The International Agency for Research on Cancer (IARC) has identified 13 cancers associated with overweight and obesity: meningioma, multiple myeloma, adenocarcinoma of the esophagus, and cancers of the thyroid, postmenopausal breast cancer, gallbladder, stomach, liver, pancreas, kidney, ovaries, uterus, colon and rectal (colorectal) cancers. 55 percent of all cancers diagnosed in  women and 24 percent of those diagnosed in men are associated with overweight and  obesity.  Recommendation: At this point it is urgent that you take a step back and concentrate in loosing weight. Dedicate 100% of your efforts on this task. Nothing else will improve your health more than bringing your weight down and your BMI to less than 30. If you are here, you probably have chronic pain. We know that most chronic pain patients have difficulty exercising secondary to their pain. For this reason, you must rely on proper nutrition and diet in order to lose the weight. If your BMI is above 40, you should seriously consider bariatric surgery. A realistic goal is to lose 10% of your body weight over a period of 12 months.  Be honest to yourself, if over time you have unsuccessfully tried to lose weight, then it is time for you to seek professional help and to enter a medically supervised weight management program, and/or undergo bariatric surgery. Stop procrastinating.   Pain management considerations:  1.    Pharmacological Problems: Be advised that the use of opioid analgesics (oxycodone; hydrocodone; morphine; methadone; codeine; and all of their derivatives) have been associated with decreased metabolism and weight gain.  For this reason, should we see that you are unable to lose weight while taking these medications, it may become necessary for [place] to taper down and indefinitely discontinue them.  2.    Technical Problems: The incidence of successful interventional therapies decreases as the patient's BMI increases. It is much more difficult to accomplish a safe and effective interventional therapy on a patient with a BMI above 35. 3.    Radiation Exposure Problems: The x-rays machine, used to accomplish injection therapies, will automatically increase their x-ray output in order to capture an appropriate bone image. This means that radiation exposure increases exponentially with the patient's BMI. (The higher the BMI, the higher the radiation exposure.) Although the level of radiation  used at a given time is still safe to the patient, it is not for the physician and/or assisting staff. Unfortunately, radiation exposure is accumulative. Because physicians and the staff have to do procedures and be exposed on a daily basis, this can result in health problems such as cancer and radiation burns. Radiation exposure to the staff is monitored by the radiation batches that they wear. The exposure levels are reported back to the staff on a quarterly basis. Depending on levels of exposure, physicians and staff may be obligated by law to decrease this exposure. This means that they have the right and obligation to refuse providing therapies where they may be overexposed to radiation. For this reason, physicians may decline to offer therapies such as radiofrequency ablation or implants to patients with a BMI above 40. 4.    Current Trends: Be advised that the current trend is to no longer offer certain therapies to patients with a BMI equal to, or above 35, due to increase perioperative risks, increased technical procedural difficulties, and excessive radiation exposure to healthcare personnel.  _________________________________________________________________________________________   ____________________________________________________________________________________________  Opioid Pain Medication Update  To: All patients taking opioid pain medications. (I.e.: hydrocodone, hydromorphone, oxycodone, oxymorphone, morphine, codeine, methadone, tapentadol, tramadol, buprenorphine, fentanyl, etc.)  Re: Updated review of side effects and adverse reactions of opioid analgesics, as well as new information about long term effects of this class of medications.  Direct risks of long-term opioid therapy are not limited to opioid addiction and overdose. Potential medical risks include serious fractures, breathing  problems during sleep, hyperalgesia, immunosuppression, chronic constipation, bowel  obstruction, myocardial infarction, and tooth decay secondary to xerostomia.  Unpredictable adverse effects that can occur even if you take your medication correctly: Cognitive impairment, respiratory depression, and death. Most people think that if they take their medication "correctly", and "as instructed", that they will be safe. Nothing could be farther from the truth. In reality, a significant amount of recorded deaths associated with the use of opioids has occurred in individuals that had taken the medication for a long time, and were taking their medication correctly. The following are examples of how this can happen: Patient taking his/her medication for a long time, as instructed, without any side effects, is given a certain antibiotic or another unrelated medication, which in turn triggers a "Drug-to-drug interaction" leading to disorientation, cognitive impairment, impaired reflexes, respiratory depression or an untoward event leading to serious bodily harm or injury, including death.  Patient taking his/her medication for a long time, as instructed, without any side effects, develops an acute impairment of liver and/or kidney function. This will lead to a rapid inability of the body to breakdown and eliminate their pain medication, which will result in effects similar to an "overdose", but with the same medicine and dose that they had always taken. This again may lead to disorientation, cognitive impairment, impaired reflexes, respiratory depression or an untoward event leading to serious bodily harm or injury, including death.  A similar problem will occur with patients as they grow older and their liver and kidney function begins to decrease as part of the aging process.  Background information: Historically, the original case for using long-term opioid therapy to treat chronic noncancer pain was based on safety assumptions that subsequent experience has called into question. In 1996, the  American Pain Society and the American Academy of Pain Medicine issued a consensus statement supporting long-term opioid therapy. This statement acknowledged the dangers of opioid prescribing but concluded that the risk for addiction was low; respiratory depression induced by opioids was short-lived, occurred mainly in opioid-naive patients, and was antagonized by pain; tolerance was not a common problem; and efforts to control diversion should not constrain opioid prescribing. This has now proven to be wrong. Experience regarding the risks for opioid addiction, misuse, and overdose in community practice has failed to support these assumptions.  According to the Centers for Disease Control and Prevention, fatal overdoses involving opioid analgesics have increased sharply over the past decade. Currently, more than 96,700 people die from drug overdoses every year. Opioids are a factor in 7 out of every 10 overdose deaths. Deaths from drug overdose have surpassed motor vehicle accidents as the leading cause of death for individuals between the ages of 51 and 70.  Clinical data suggest that neuroendocrine dysfunction may be very common in both men and women, potentially causing hypogonadism, erectile dysfunction, infertility, decreased libido, osteoporosis, and depression. Recent studies linked higher opioid dose to increased opioid-related mortality. Controlled observational studies reported that long-term opioid therapy may be associated with increased risk for cardiovascular events. Subsequent meta-analysis concluded that the safety of long-term opioid therapy in elderly patients has not been proven.   Side Effects and adverse reactions: Common side effects: Drowsiness (sedation). Dizziness. Nausea and vomiting. Constipation. Physical dependence -- Dependence often manifests with withdrawal symptoms when opioids are discontinued or decreased. Tolerance -- As you take repeated doses of opioids, you  require increased medication to experience the same effect of pain relief. Respiratory depression -- This can occur in healthy  people, especially with higher doses. However, people with COPD, asthma or other lung conditions may be even more susceptible to fatal respiratory impairment.  Uncommon side effects: An increased sensitivity to feeling pain and extreme response to pain (hyperalgesia). Chronic use of opioids can lead to this. Delayed gastric emptying (the process by which the contents of your stomach are moved into your small intestine). Muscle rigidity. Immune system and hormonal dysfunction. Quick, involuntary muscle jerks (myoclonus). Arrhythmia. Itchy skin (pruritus). Dry mouth (xerostomia).  Long-term side effects: Chronic constipation. Sleep-disordered breathing (SDB). Increased risk of bone fractures. Hypothalamic-pituitary-adrenal dysregulation. Increased risk of overdose.  RISKS: Respiratory depression and death: Opioids increase the risk of respiratory depression and death.  Drug-to-drug interactions: Opioids are relatively contraindicated in combination with benzodiazepines, sleep inducers, and other central nervous system depressants. Other classes of medications (i.e.: certain antibiotics and even over-the-counter medications) may also trigger or induce respiratory depression in some patients.  Medical conditions: Patients with pre-existing respiratory problems are at higher risk of respiratory failure and/or depression when in combination with opioid analgesics. Opioids are relatively contraindicated in some medical conditions such as central sleep apnea.   Fractures and Falls:  Opioids increase the risk and incidence of falls. This is of particular importance in elderly patients.  Endocrine System:  Long-term administration is associated with endocrine abnormalities (endocrinopathies). (Also known as Opioid-induced Endocrinopathy) Influences on both the  hypothalamic-pituitary-adrenal axis?and the hypothalamic-pituitary-gonadal axis have been demonstrated with consequent hypogonadism and adrenal insufficiency in both sexes. Hypogonadism and decreased levels of dehydroepiandrosterone sulfate have been reported in men and women. Endocrine effects include: Amenorrhoea in women (abnormal absence of menstruation) Reduced libido in both sexes Decreased sexual function Erectile dysfunction in men Hypogonadisms (decreased testicular function with shrinkage of testicles) Infertility Depression and fatigue Loss of muscle mass Anxiety Depression Immune suppression Hyperalgesia Weight gain Anemia Osteoporosis Patients (particularly women of childbearing age) should avoid opioids. There is insufficient evidence to recommend routine monitoring of asymptomatic patients taking opioids in the long-term for hormonal deficiencies.  Immune System: Human studies have demonstrated that opioids have an immunomodulating effect. These effects are mediated via opioid receptors both on immune effector cells and in the central nervous system. Opioids have been demonstrated to have adverse effects on antimicrobial response and anti-tumour surveillance. Buprenorphine has been demonstrated to have no impact on immune function.  Opioid Induced Hyperalgesia: Human studies have demonstrated that prolonged use of opioids can lead to a state of abnormal pain sensitivity, sometimes called opioid induced hyperalgesia (OIH). Opioid induced hyperalgesia is not usually seen in the absence of tolerance to opioid analgesia. Clinically, hyperalgesia may be diagnosed if the patient on long-term opioid therapy presents with increased pain. This might be qualitatively and anatomically distinct from pain related to disease progression or to breakthrough pain resulting from development of opioid tolerance. Pain associated with hyperalgesia tends to be more diffuse than the pre-existing  pain and less defined in quality. Management of opioid induced hyperalgesia requires opioid dose reduction.  Cancer: Chronic opioid therapy has been associated with an increased risk of cancer among noncancer patients with chronic pain. This association was more evident in chronic strong opioid users. Chronic opioid consumption causes significant pathological changes in the small intestine and colon. Epidemiological studies have found that there is a link between opium dependence and initiation of gastrointestinal cancers. Cancer is the second leading cause of death after cardiovascular disease. Chronic use of opioids can cause multiple conditions such as GERD, immunosuppression and renal damage as well as  carcinogenic effects, which are associated with the incidence of cancers.   Mortality: Long-term opioid use has been associated with increased mortality among patients with chronic non-cancer pain (CNCP).  Prescription of long-acting opioids for chronic noncancer pain was associated with a significantly increased risk of all-cause mortality, including deaths from causes other than overdose.  Reference: Von Korff M, Kolodny A, Deyo RA, Chou R. Long-term opioid therapy reconsidered. Ann Intern Med. 2011 Sep 6;155(5):325-8. doi: 10.7326/[id number]. PMID: 81191478; PMCID: [id number]. Randon Goldsmith, Hayward RA, Dunn KM, [place] KP. Risk of adverse events in patients prescribed long-term opioids: A cohort study in the [place] Clinical Practice Research Datalink. Eur J Pain. 2019 May;23(5):908-922. doi: 10.1002/ejp.1357. Epub 2019 Jan 31. PMID: 57846962. Colameco S, Coren JS, Ciervo CA. Continuous opioid treatment for chronic noncancer pain: a time for moderation in prescribing. Postgrad Med. 2009 Jul;121(4):61-6. doi: 10.3810/pgm.2009.07.2032. PMID: 95284132. William Hamburger RN, Burke SD, Blazina I, Cristopher [place], Bougatsos C, Deyo RA. The effectiveness and  risks of long-term opioid therapy for chronic pain: a systematic review for a [employer] of [employer] Pathways to [employer]. Ann Intern Med. 2015 Feb 17;162(4):276-86. doi: 10.7326/M14-2559. PMID: 44010272. Caryl Bis [facility], [place] DM. NCHS Data Brief No. 22. [place]: Centers for Disease Control and Prevention; 2009. Sep, Increase in Fatal Poisonings Involving Opioid Analgesics in the [place], 1999-2006. Song IA, Choi HR, Oh TK. Long-term opioid use and mortality in patients with chronic non-cancer pain: Ten-year follow-up study in [place] from 2010 through 2019. EClinicalMedicine. 2022 Jul 18;51:101558. doi: 10.1016/j.eclinm.2022.536644. PMID: 03474259; PMCID: [id number]. Huser, W., Schubert, T., Vogelmann, T. et al. All-cause mortality in patients with long-term opioid therapy compared with non-opioid analgesics for chronic non-cancer pain: a database study. BMC Med 18, 162 (2020). http://lester.info/ Rashidian H, Karie Kirks, Malekzadeh R, Haghdoost AA. An Ecological Study of the Association between Opiate Use and Incidence of Cancers. Addict Health. 2016 Fall;8(4):252-260. PMID: 29518841; PMCID: [id number].  Our Goal: Our goal is to control your pain with means other than the use of opioid pain medications.  Our Recommendation: Talk to your physician about coming off of these medications. We can assist you with the tapering down and stopping these medicines. Based on the new information, even if you cannot completely stop the medication, a decrease in the dose may be associated with a lesser risk. Ask for other means of controlling the pain. Decrease or eliminate those factors that significantly contribute to your pain such as smoking, obesity, and a diet heavily tilted towards "inflammatory" nutrients.  Last Updated: 07/25/2022   ____________________________________________________________________________________________      ____________________________________________________________________________________________  National Pain Medication Shortage  The [place] is experiencing worsening drug shortages. These have had a negative widespread effect on patient care and treatment. Not expected to improve any time soon. Predicted to last past 2029.   Drug shortage list (generic names) Oxycodone IR Oxycodone/APAP Oxymorphone IR Hydromorphone Hydrocodone/APAP Morphine  Where is the problem?  Manufacturing and supply level.  Will this shortage affect you?  Only if you take any of the above pain medications.  How? You may be unable to fill your prescription.  Your pharmacist may offer a "partial fill" of your prescription. (Warning: Do not accept partial fills.) Prescriptions partially filled cannot be transferred to another pharmacy. Read our Medication Rules and Regulation. Depending on how much medicine you are dependent on, you may experience withdrawals when unable to get the medication.  Recommendations: Consider ending your dependence on  opioid pain medications. Ask your pain specialist to assist you with the process. Consider switching to a medication currently not in shortage, such as Buprenorphine. Talk to your pain specialist about this option. Consider decreasing your pain medication requirements by managing tolerance thru "Drug Holidays". This may help minimize withdrawals, should you run out of medicine. Control your pain thru the use of non-pharmacological interventional therapies.   Your prescriber: Prescribers cannot be blamed for shortages. Medication manufacturing and supply issues cannot be fixed by the prescriber.   NOTE: The prescriber is not responsible for supplying the medication, or solving supply issues. Work with your pharmacist to solve it. The patient is responsible for the decision to take or continue taking the medication and for identifying and securing a legal supply source. By  law, supplying the medication is the job and responsibility of the pharmacy. The prescriber is responsible for the evaluation, monitoring, and prescribing of these medications.   Prescribers will NOT: Re-issue prescriptions that have been partially filled. Re-issue prescriptions already sent to a pharmacy.  Re-send prescriptions to a different pharmacy because yours did not have your medication. Ask pharmacist to order more medicine or transfer the prescription to another pharmacy. (Read below.)  New 2023 regulation: "September 17, 2021 Revised Regulation Allows DEA-Registered Pharmacies to Transfer Electronic Prescriptions at a Patient's Request DEA Headquarters Division - Public Information Office Patients now have the ability to request their electronic prescription be transferred to another pharmacy without having to go back to their practitioner to initiate the request. This revised regulation went into effect on Monday, September 13, 2021.     At a patient's request, a DEA-registered retail pharmacy can now transfer an electronic prescription for a controlled substance (schedules II-V) to another DEA-registered retail pharmacy. Prior to this change, patients would have to go through their practitioner to cancel their prescription and have it re-issued to a different pharmacy. The process was taxing and time consuming for both patients and practitioners.    The Drug Enforcement Administration [facility]) published its intent to revise the process for transferring electronic prescriptions on December 06, 2019.  The final rule was published in the federal register on August 12, 2021 and went into effect 30 days later.  Under the final rule, a prescription can only be transferred once between pharmacies, and only if allowed under existing state or other applicable law. The prescription must remain in its electronic form; may not be altered in any way; and the transfer must be communicated directly between  two licensed pharmacists. It's important to note, any authorized refills transfer with the original prescription, which means the entire prescription will be filled at the same pharmacy".  Reference: HugeHand.is [facility] website announcement)  [email or web address].pdf [employer])   [employer] / Vol. 88, No. 143 / Thursday, August 12, 2021 / Rules and Regulations DEPARTMENT OF JUSTICE  Drug Enforcement Administration  21 CFR Part 1306  [Docket No. [id number]]  RIN [id number] Transfer of Electronic Prescriptions for Schedules II-V Controlled Substances Between Pharmacies for Initial Filling  ____________________________________________________________________________________________     ____________________________________________________________________________________________  Transfer of Pain Medication between Pharmacies  Re: 2023 DEA Clarification on existing regulation  Published on DEA Website: September 17, 2021  Title: Revised Regulation Allows DEA-Registered Pharmacies to Electrical engineer Prescriptions at a Patient's Request DEA Headquarters Division - [employer]  "Patients now have the ability to request their electronic prescription be transferred to another pharmacy without having to go back to their practitioner to  initiate the request. This revised regulation went into effect on Monday, September 13, 2021.     At a patient's request, a DEA-registered retail pharmacy can now transfer an electronic prescription for a controlled substance (schedules II-V) to another DEA-registered retail pharmacy. Prior to this change, patients would have to go through their practitioner to cancel their prescription and have it re-issued to a different pharmacy. The process was taxing and time consuming for  both patients and practitioners.    The Drug Enforcement Administration [facility]) published its intent to revise the process for transferring electronic prescriptions on December 06, 2019.  The final rule was published in the federal register on August 12, 2021 and went into effect 30 days later.  Under the final rule, a prescription can only be transferred once between pharmacies, and only if allowed under existing state or other applicable law. The prescription must remain in its electronic form; may not be altered in any way; and the transfer must be communicated directly between two licensed pharmacists. It's important to note, any authorized refills transfer with the original prescription, which means the entire prescription will be filled at the same pharmacy."    REFERENCES: 1. DEA website announcement HugeHand.is  2. Department of Justice website  CheapWipes.at.pdf  3. DEPARTMENT OF JUSTICE Drug Enforcement Administration 21 CFR Part 1306 [Docket No. [id number]-637] RIN 1117-AB64 "Transfer of Electronic Prescriptions for Schedules II-V Controlled Substances Between Pharmacies for Initial Filling"  ____________________________________________________________________________________________     _______________________________________________________________________  Medication Rules  Purpose: To inform patients, and their family members, of our medication rules and regulations.  Applies to: All patients receiving prescriptions from our practice (written or electronic).  Pharmacy of record: This is the pharmacy where your electronic prescriptions will be sent. Make sure we have the correct one.  Electronic prescriptions: In compliance with the [facility] Strengthen Opioid Misuse Prevention (STOP) Act of 2017 (Session Conni Elliot [id number]),  effective January 17, 2018, all controlled substances must be electronically prescribed. Written prescriptions, faxing, or calling prescriptions to a pharmacy will no longer be done.  Prescription refills: These will be provided only during in-person appointments. No medications will be renewed without a "face-to-face" evaluation with your provider. Applies to all prescriptions.  NOTE: The following applies primarily to controlled substances (Opioid* Pain Medications).   Type of encounter (visit): For patients receiving controlled substances, face-to-face visits are required. (Not an option and not up to the patient.)  Patient's responsibilities: Pain Pills: Bring all pain pills to every appointment (except for procedure appointments). Pill Bottles: Bring pills in original pharmacy bottle. Bring bottle, even if empty. Always bring the bottle of the most recent fill.  Medication refills: You are responsible for knowing and keeping track of what medications you are taking and when is it that you will need a refill. The day before your appointment: write a list of all prescriptions that need to be refilled. The day of the appointment: give the list to the admitting nurse. Prescriptions will be written only during appointments. No prescriptions will be written on procedure days. If you forget a medication: it will not be "Called in", "Faxed", or "electronically sent". You will need to get another appointment to get these prescribed. No early refills. Do not call asking to have your prescription filled early. Partial  or short prescriptions: Occasionally your pharmacy may not have enough pills to fill your prescription.  NEVER ACCEPT a partial fill or a prescription that is short of the total amount of pills that you  were prescribed.  With controlled substances the law allows 72 hours for the pharmacy to complete the prescription.  If the prescription is not completed within 72 hours, the pharmacist will  require a new prescription to be written. This means that you will be short on your medicine and we WILL NOT send another prescription to complete your original prescription.  Instead, request the pharmacy to send a carrier to a nearby branch to get enough medication to provide you with your full prescription. Prescription Accuracy: You are responsible for carefully inspecting your prescriptions before leaving our office. Have the discharge nurse carefully go over each prescription with you, before taking them home. Make sure that your name is accurately spelled, that your address is correct. Check the name and dose of your medication to make sure it is accurate. Check the number of pills, and the written instructions to make sure they are clear and accurate. Make sure that you are given enough medication to last until your next medication refill appointment. Taking Medication: Take medication as prescribed. When it comes to controlled substances, taking less pills or less frequently than prescribed is permitted and encouraged. Never take more pills than instructed. Never take the medication more frequently than prescribed.  Inform other Doctors: Always inform, all of your healthcare providers, of all the medications you take. Pain Medication from other Providers: You are not allowed to accept any additional pain medication from any other Doctor or Healthcare provider. There are two exceptions to this rule. (see below) In the event that you require additional pain medication, you are responsible for notifying us, as stated below. Cough Medicine: Often these contain an opioid, such as codeine or hydrocodone. Never accept or take cough medicine containing these opioids if you are already taking an opioid* medication. The combination may cause respiratory failure and death. Medication Agreement: You are responsible for carefully reading and following our Medication Agreement. This must be signed before  receiving any prescriptions from our practice. Safely store a copy of your signed Agreement. Violations to the Agreement will result in no further prescriptions. (Additional copies of our Medication Agreement are available upon request.) Laws, Rules, & Regulations: All patients are expected to follow all [address] and [employer], Rules, [place] [employer] [place] [employer]. Ignorance of the Laws does not constitute a valid excuse.  Illegal drugs and Controlled Substances: The use of illegal substances (including, but not limited to marijuana and its derivatives) and/or the illegal use of any controlled substances is strictly prohibited. Violation of this rule may result in the immediate and permanent discontinuation of any and all prescriptions being written by our practice. The use of any illegal substances is prohibited. Adopted CDC guidelines & recommendations: Target dosing levels will be at or below 60 MME/day. Use of benzodiazepines** is not recommended.  Exceptions: There are only two exceptions to the rule of not receiving pain medications from other Healthcare Providers. Exception #1 (Emergencies): In the event of an emergency (i.e.: accident requiring emergency care), you are allowed to receive additional pain medication. However, you are responsible for: As soon as you are able, call our office [phone], at any time of the day or night, and leave a message stating your name, the date and nature of the emergency, and the name and dose of the medication prescribed. In the event that your call is answered by a member of our staff, make sure to document and save the date, time, and the name of the person that took your information.  Exception #2 (Planned Surgery): In the event that you are scheduled by another doctor or dentist to have any type of surgery or procedure, you are allowed (for a period no longer than 30 days), to receive additional pain medication, for the acute post-op pain. However, in this case,  you are responsible for picking up a copy of our "Post-op Pain Management for Surgeons" handout, and giving it to your surgeon or dentist. This document is available at our office, and does not require an appointment to obtain it. Simply go to our office during business hours (Monday-Thursday from 8:00 AM to 4:00 PM) (Friday 8:00 AM to 12:00 Noon) or if you have a scheduled appointment with [place], prior to your surgery, and ask for it by name. In addition, you are responsible for: calling our office [phone], at any time of the day or night, and leaving a message stating your name, name of your surgeon, type of surgery, and date of procedure or surgery. Failure to comply with your responsibilities may result in termination of therapy involving the controlled substances. Medication Agreement Violation. Following the above rules, including your responsibilities will help you in avoiding a Medication Agreement Violation ("Breaking your Pain Medication Contract").  Consequences:  Not following the above rules may result in permanent discontinuation of medication prescription therapy.  *Opioid medications include: morphine, codeine, oxycodone, oxymorphone, hydrocodone, hydromorphone, meperidine, tramadol, tapentadol, buprenorphine, fentanyl, methadone. **Benzodiazepine medications include: diazepam (Valium), alprazolam (Xanax), clonazepam (Klonopine), lorazepam (Ativan), clorazepate (Tranxene), chlordiazepoxide (Librium), estazolam (Prosom), oxazepam (Serax), temazepam (Restoril), triazolam (Halcion) (Last updated: 11/09/2021) ______________________________________________________________________    ______________________________________________________________________  Medication Recommendations and Reminders  Applies to: All patients receiving prescriptions (written and/or electronic).  Medication Rules & Regulations: You are responsible for reading, knowing, and following our "Medication Rules"  document. These exist for your safety and that of others. They are not flexible and neither are we. Dismissing or ignoring them is an act of "non-compliance" that may result in complete and irreversible termination of such medication therapy. For safety reasons, "non-compliance" will not be tolerated. As with the [place] fundamental legal principle of "ignorance of the law is no defense", we will accept no excuses for not having read and knowing the content of documents provided to you by our practice.  Pharmacy of record:  Definition: This is the pharmacy where your electronic prescriptions will be sent.  We do not endorse any particular pharmacy. It is up to you and your insurance to decide what pharmacy to use.  We do not restrict you in your choice of pharmacy. However, once we write for your prescriptions, we will NOT be re-sending more prescriptions to fix restricted supply problems created by your pharmacy, or your insurance.  The pharmacy listed in the electronic medical record should be the one where you want electronic prescriptions to be sent. If you choose to change pharmacy, simply notify our nursing staff. Changes will be made only during your regular appointments and not over the phone.  Recommendations: Keep all of your pain medications in a safe place, under lock and key, even if you live alone. We will NOT replace lost, stolen, or damaged medication. We do not accept "Police Reports" as proof of medications having been stolen. After you fill your prescription, take 1 week's worth of pills and put them away in a safe place. You should keep a separate, properly labeled bottle for this purpose. The remainder should be kept in the original bottle. Use this as your primary supply, until it  runs out. Once it's gone, then you know that you have 1 week's worth of medicine, and it is time to come in for a prescription refill. If you do this correctly, it is unlikely that you will ever run out of  medicine. To make sure that the above recommendation works, it is very important that you make sure your medication refill appointments are scheduled at least 1 week before you run out of medicine. To do this in an effective manner, make sure that you do not leave the office without scheduling your next medication management appointment. Always ask the nursing staff to show you in your prescription , when your medication will be running out. Then arrange for the receptionist to get you a return appointment, at least 7 days before you run out of medicine. Do not wait until you have 1 or 2 pills left, to come in. This is very poor planning and does not take into consideration that we may need to cancel appointments due to bad weather, sickness, or emergencies affecting our staff. DO NOT ACCEPT A "Partial Fill": If for any reason your pharmacy does not have enough pills/tablets to completely fill or refill your prescription, do not allow for a "partial fill". The law allows the pharmacy to complete that prescription within 72 hours, without requiring a new prescription. If they do not fill the rest of your prescription within those 72 hours, you will need a separate prescription to fill the remaining amount, which we will NOT provide. If the reason for the partial fill is your insurance, you will need to talk to the pharmacist about payment alternatives for the remaining tablets, but again, DO NOT ACCEPT A PARTIAL FILL, unless you can trust your pharmacist to obtain the remainder of the pills within 72 hours.  Prescription refills and/or changes in medication(s):  Prescription refills, and/or changes in dose or medication, will be conducted only during scheduled medication management appointments. (Applies to both, written and electronic prescriptions.) No refills on procedure days. No medication will be changed or started on procedure days. No changes, adjustments, and/or refills will be conducted on a procedure  day. Doing so will interfere with the diagnostic portion of the procedure. No phone refills. No medications will be "called into the pharmacy". No Fax refills. No weekend refills. No Holliday refills. No after hours refills.  Remember:  Business hours are:  Monday to Thursday 8:00 AM to 4:00 PM Provider's Schedule: Delano Metz, MD - Appointments are:  Medication management: Monday and Wednesday 8:00 AM to 4:00 PM Procedure day: Tuesday and Thursday 7:30 AM to 4:00 PM Edward Jolly, MD - Appointments are:  Medication management: Tuesday and Thursday 8:00 AM to 4:00 PM Procedure day: Monday and Wednesday 7:30 AM to 4:00 PM (Last update: 11/09/2021) ______________________________________________________________________    ____________________________________________________________________________________________  Drug Holidays  What is a "Drug Holiday"? Drug Holiday: is the name given to the process of slowly tapering down and temporarily stopping the pain medication for the purpose of decreasing or eliminating tolerance to the drug.  Benefits Improved effectiveness Decreased required effective dose Improved pain control End dependence on high dose therapy Decrease cost of therapy Uncovering "opioid-induced hyperalgesia". (OIH)  What is "opioid hyperalgesia"? It is a paradoxical increase in pain caused by exposure to opioids. Stopping the opioid pain medication, contrary to the expected, it actually decreases or completely eliminates the pain. Ref.: "A comprehensive review of opioid-induced hyperalgesia". Donney Rankins, et.al. Pain Physician. 2011 Mar-Apr;14(2):145-61.  What is tolerance? Tolerance: the  progressive loss of effectiveness of a pain medicine due to repetitive use. A common problem of opioid pain medications.  How long should a "Drug Holiday" last? Effectiveness depends on the patient staying off all opioid pain medicines for a minimum of 14 consecutive days.  (2 weeks)  How about just taking less of the medicine? Does not work. Will not accomplish goal of eliminating the excess receptors.  How about switching to a different pain medicine? (AKA. "Opioid rotation") Does not work. Creates the illusion of effectiveness by taking advantage of inaccurate equivalent dose calculations between different opioids. -This "technique" was promoted by studies funded by [employer], such as [employer], creators of "OxyContin".  Can I stop the medicine "cold [place]"? We do not recommend it. You should always coordinate with your prescribing physician to make the transition as smoothly as possible. Avoid stopping the medicine abruptly without consulting. We recommend a "slow taper".  What is a slow taper? Taper: refers to the gradual decrease in dose.   How do I stop/taper the dose? Slowly. Decrease the daily amount of pills that you take by one (1) pill every seven (7) days. This is called a "slow downward taper". Example: if you normally take four (4) pills per day, drop it to three (3) pills per day for seven (7) days, then to two (2) pills per day for seven (7) days, then to one (1) per day for seven (7) days, and then stop the medicine. The 14 day "Drug Holiday" starts on the first day without medicine.   Will I experience withdrawals? Unlikely with a slow taper.  What triggers withdrawals? Withdrawals are triggered by the sudden/abrupt stop of high dose opioids. Withdrawals can be avoided by slowly decreasing the dose over a prolonged period of time.  What are withdrawals? Symptoms associated with sudden/abrupt reduction/stopping of high-dose, long-term use of pain medication. Withdrawal are seldom seen on low dose therapy, or patients rarely taking opioid medication.  Early Withdrawal Symptoms may include: Agitation Anxiety Muscle aches Increased tearing Insomnia Runny nose Sweating Yawning  Late symptoms may include: Abdominal  cramping Diarrhea Dilated pupils Goose bumps Nausea Vomiting  When could I see withdrawals? Onset: 8-24 hours after last use for most opioids. 12-48 hours for long-acting opioids (i.e.: methadone)  How long could they last? Duration: 4-10 days for most opioids. 14-21 days for long-acting opioids (i.e.: methadone)  What will happen after I complete my "Drug Holiday"? The need and indications for the opioid analgesic will be reviewed before restarting the medication. Dose requirements will likely decrease and the dose will need to be adjusted accordingly.   (Last update: 04/06/2022) ____________________________________________________________________________________________   ____________________________________________________________________________________________  Naloxone Nasal Spray  Why am I receiving this medication? [place] STOP ACT requires that all patients taking high dose opioids or at risk of opioids respiratory depression, be prescribed an opioid reversal agent, such as Naloxone (AKA: Narcan).  What is this medication? NALOXONE (nal OX one) treats opioid overdose, which causes slow or shallow breathing, severe drowsiness, or trouble staying awake. Call emergency services after using this medication. You may need additional treatment. Naloxone works by reversing the effects of opioids. It belongs to a group of medications called opioid blockers.  COMMON BRAND NAME(S): Kloxxado, Narcan  What should I tell my care team before I take this medication? They need to know if you have any of these conditions: Heart disease Substance use disorder An unusual or allergic reaction to naloxone, other medications, foods, dyes, or preservatives  Pregnant or trying to get pregnant Breast-feeding  When to use this medication? This medication is to be used for the treatment of respiratory depression (less than 8 breaths per minute) secondary to opioid overdose.   How to use  this medication? This medication is for use in the nose. Lay the person on their back. Support their neck with your hand and allow the head to tilt back before giving the medication. The nasal spray should be given into 1 nostril. After giving the medication, move the person onto their side. Do not remove or test the nasal spray until ready to use. Get emergency medical help right away after giving the first dose of this medication, even if the person wakes up. You should be familiar with how to recognize the signs and symptoms of a narcotic overdose. If more doses are needed, give the additional dose in the other nostril. Talk to your care team about the use of this medication in children. While this medication may be prescribed for children as young as newborns for selected conditions, precautions do apply.  Naloxone Overdosage: If you think you have taken too much of this medicine contact a poison control center or emergency room at once.  NOTE: This medicine is only for you. Do not share this medicine with others.  What if I miss a dose? This does not apply.  What may interact with this medication? This is only used during an emergency. No interactions are expected during emergency use. This list may not describe all possible interactions. Give your health care provider a list of all the medicines, herbs, non-prescription drugs, or dietary supplements you use. Also tell them if you smoke, drink alcohol, or use illegal drugs. Some items may interact with your medicine.  What should I watch for while using this medication? Keep this medication ready for use in the case of an opioid overdose. Make sure that you have the phone number of your care team and local hospital ready. You may need to have additional doses of this medication. Each nasal spray contains a single dose. Some emergencies may require additional doses. After use, bring the treated person to the nearest hospital or call 911. Make  sure the treating care team knows that the person has received a dose of this medication. You will receive additional instructions on what to do during and after use of this medication before an emergency occurs.  What side effects may I notice from receiving this medication? Side effects that you should report to your care team as soon as possible: Allergic reactions--skin rash, itching, hives, swelling of the face, lips, tongue, or throat Side effects that usually do not require medical attention (report these to your care team if they continue or are bothersome): Constipation Dryness or irritation inside the nose Headache Increase in blood pressure Muscle spasms Stuffy nose Toothache This list may not describe all possible side effects. Call your doctor for medical advice about side effects. You may report side effects to FDA at 1-800-FDA-1088.  Where should I keep my medication? Because this is an emergency medication, you should keep it with you at all times.  Keep out of the reach of children and pets. Store between 20 and 25 degrees C (68 and 77 degrees F). Do not freeze. Throw away any unused medication after the expiration date. Keep in original box until ready to use.  NOTE: This sheet is a summary. It may not cover all possible information. If you have questions  about this medicine, talk to your doctor, pharmacist, or health care provider.   2023 Elsevier/Gold Standard (2020-09-11 00:00:00)  ____________________________________________________________________________________________

## 2022-09-13 ENCOUNTER — Ambulatory Visit (INDEPENDENT_AMBULATORY_CARE_PROVIDER_SITE_OTHER): Payer: 59 | Admitting: Podiatry

## 2022-09-13 ENCOUNTER — Encounter: Payer: Self-pay | Admitting: Podiatry

## 2022-09-13 DIAGNOSIS — M79674 Pain in right toe(s): Secondary | ICD-10-CM | POA: Diagnosis not present

## 2022-09-13 DIAGNOSIS — B351 Tinea unguium: Secondary | ICD-10-CM | POA: Diagnosis not present

## 2022-09-13 DIAGNOSIS — M79675 Pain in left toe(s): Secondary | ICD-10-CM

## 2022-09-13 NOTE — Progress Notes (Signed)
   Chief Complaint  Patient presents with   Nail Problem    "Supposed to cut my nails.  The bottom of my feet, feels like I have a whole nother layer of skin."    SUBJECTIVE Patient with a history of diabetes mellitus presents to office today complaining of elongated, thickened nails that cause pain while ambulating in shoes.  Patient is unable to trim their own nails. Patient is here for further evaluation and treatment.  Past Medical History:  Diagnosis Date   Arthritis    Diabetes mellitus without complication (HCC)    Hyperlipidemia    Hypertension     No Known Allergies   OBJECTIVE General Patient is awake, alert, and oriented x 3 and in no acute distress. Derm Skin is dry and supple bilateral. Negative open lesions or macerations. Remaining integument unremarkable. Nails are tender, long, thickened and dystrophic with subungual debris, consistent with onychomycosis, 1-5 bilateral. No signs of infection noted. Vasc  DP and PT pedal pulses palpable bilaterally. Temperature gradient within normal limits.  Neuro Epicritic and protective threshold sensation diminished bilaterally.  Musculoskeletal Exam No symptomatic pedal deformities noted bilateral. Muscular strength within normal limits.  ASSESSMENT 1. Diabetes Mellitus w/ peripheral neuropathy 2.  Pain due to onychomycosis of toenails bilateral  PLAN OF CARE 1. Patient evaluated today. 2. Instructed to maintain good pedal hygiene and foot care. Stressed importance of controlling blood sugar.  3. Mechanical debridement of nails 1-5 bilaterally performed using a nail nipper. Filed with dremel without incident.  4. Return to clinic in 3 mos.     Felecia Shelling, DPM [facility]  Dr. Felecia Shelling, DPM    [address] [place], [place] [address]                Office [phone]  Fax [phone]

## 2022-09-18 ENCOUNTER — Other Ambulatory Visit: Payer: Self-pay | Admitting: Family Medicine

## 2022-09-18 DIAGNOSIS — E1169 Type 2 diabetes mellitus with other specified complication: Secondary | ICD-10-CM

## 2022-09-23 ENCOUNTER — Emergency Department: Payer: 59

## 2022-09-23 ENCOUNTER — Other Ambulatory Visit: Payer: Self-pay

## 2022-09-23 ENCOUNTER — Inpatient Hospital Stay
Admission: EM | Admit: 2022-09-23 | Discharge: 2022-09-29 | DRG: 092 | Disposition: A | Discharge status: Skilled Nursing Facility | Payer: 59 | Attending: Internal Medicine | Admitting: Internal Medicine

## 2022-09-23 DIAGNOSIS — E662 Morbid (severe) obesity with alveolar hypoventilation: Secondary | ICD-10-CM

## 2022-09-23 DIAGNOSIS — M129 Arthropathy, unspecified: Secondary | ICD-10-CM

## 2022-09-23 DIAGNOSIS — Z79899 Other long term (current) drug therapy: Secondary | ICD-10-CM

## 2022-09-23 DIAGNOSIS — Z7984 Long term (current) use of oral hypoglycemic drugs: Secondary | ICD-10-CM

## 2022-09-23 DIAGNOSIS — Z23 Encounter for immunization: Secondary | ICD-10-CM

## 2022-09-23 DIAGNOSIS — M159 Polyosteoarthritis, unspecified: Secondary | ICD-10-CM

## 2022-09-23 DIAGNOSIS — Z7985 Long-term (current) use of injectable non-insulin antidiabetic drugs: Secondary | ICD-10-CM

## 2022-09-23 DIAGNOSIS — T402X5A Adverse effect of other opioids, initial encounter: Secondary | ICD-10-CM | POA: Diagnosis not present

## 2022-09-23 DIAGNOSIS — G894 Chronic pain syndrome: Principal | ICD-10-CM | POA: Diagnosis present

## 2022-09-23 DIAGNOSIS — Z7952 Long term (current) use of systemic steroids: Secondary | ICD-10-CM

## 2022-09-23 DIAGNOSIS — M47816 Spondylosis without myelopathy or radiculopathy, lumbar region: Secondary | ICD-10-CM

## 2022-09-23 DIAGNOSIS — R52 Pain, unspecified: Secondary | ICD-10-CM

## 2022-09-23 DIAGNOSIS — G8929 Other chronic pain: Secondary | ICD-10-CM | POA: Diagnosis present

## 2022-09-23 DIAGNOSIS — Z789 Other specified health status: Secondary | ICD-10-CM

## 2022-09-23 DIAGNOSIS — L299 Pruritus, unspecified: Secondary | ICD-10-CM | POA: Diagnosis not present

## 2022-09-23 DIAGNOSIS — E66813 Obesity, class 3: Secondary | ICD-10-CM

## 2022-09-23 DIAGNOSIS — Z8249 Family history of ischemic heart disease and other diseases of the circulatory system: Secondary | ICD-10-CM

## 2022-09-23 DIAGNOSIS — E785 Hyperlipidemia, unspecified: Secondary | ICD-10-CM | POA: Diagnosis present

## 2022-09-23 DIAGNOSIS — G608 Other hereditary and idiopathic neuropathies: Secondary | ICD-10-CM | POA: Diagnosis present

## 2022-09-23 DIAGNOSIS — F17210 Nicotine dependence, cigarettes, uncomplicated: Secondary | ICD-10-CM | POA: Diagnosis present

## 2022-09-23 DIAGNOSIS — E1142 Type 2 diabetes mellitus with diabetic polyneuropathy: Secondary | ICD-10-CM | POA: Diagnosis present

## 2022-09-23 DIAGNOSIS — Z79891 Long term (current) use of opiate analgesic: Secondary | ICD-10-CM

## 2022-09-23 DIAGNOSIS — R627 Adult failure to thrive: Secondary | ICD-10-CM | POA: Diagnosis not present

## 2022-09-23 DIAGNOSIS — I10 Essential (primary) hypertension: Secondary | ICD-10-CM | POA: Diagnosis present

## 2022-09-23 DIAGNOSIS — E876 Hypokalemia: Secondary | ICD-10-CM | POA: Diagnosis present

## 2022-09-23 DIAGNOSIS — M160 Bilateral primary osteoarthritis of hip: Secondary | ICD-10-CM | POA: Diagnosis present

## 2022-09-23 DIAGNOSIS — E1165 Type 2 diabetes mellitus with hyperglycemia: Secondary | ICD-10-CM | POA: Diagnosis present

## 2022-09-23 DIAGNOSIS — M48061 Spinal stenosis, lumbar region without neurogenic claudication: Secondary | ICD-10-CM | POA: Diagnosis present

## 2022-09-23 DIAGNOSIS — E1169 Type 2 diabetes mellitus with other specified complication: Secondary | ICD-10-CM | POA: Diagnosis present

## 2022-09-23 DIAGNOSIS — Z6841 Body Mass Index (BMI) 40.0 and over, adult: Secondary | ICD-10-CM

## 2022-09-23 DIAGNOSIS — M5136 Other intervertebral disc degeneration, lumbar region: Secondary | ICD-10-CM

## 2022-09-23 DIAGNOSIS — R262 Difficulty in walking, not elsewhere classified: Secondary | ICD-10-CM

## 2022-09-23 DIAGNOSIS — Z8261 Family history of arthritis: Secondary | ICD-10-CM

## 2022-09-23 LAB — COMPREHENSIVE METABOLIC PANEL
ALT: 6 U/L (ref 0–44)
AST: 13 U/L — ABNORMAL LOW (ref 15–41)
Albumin: 3.8 g/dL (ref 3.5–5.0)
Alkaline Phosphatase: 103 U/L (ref 38–126)
Anion gap: 15 (ref 5–15)
BUN: 9 mg/dL (ref 6–20)
CO2: 27 mmol/L (ref 22–32)
Calcium: 9.3 mg/dL (ref 8.9–10.3)
Chloride: 94 mmol/L — ABNORMAL LOW (ref 98–111)
Creatinine, Ser: 0.58 mg/dL — ABNORMAL LOW (ref 0.61–1.24)
GFR, Estimated: >60 mL/min (ref >60)
Glucose, Bld: 106 mg/dL — ABNORMAL HIGH (ref 70–99)
Potassium: 3.1 mmol/L — ABNORMAL LOW (ref 3.5–5.1)
Sodium: 136 mmol/L (ref 135–145)
Total Bilirubin: 0.9 mg/dL (ref 0.3–1.2)
Total Protein: 7.4 g/dL (ref 6.5–8.1)

## 2022-09-23 LAB — CBC
HCT: 46.7 % (ref 39.0–52.0)
Hemoglobin: 15.4 g/dL (ref 13.0–17.0)
MCH: 29.1 pg (ref 26.0–34.0)
MCHC: 33 g/dL (ref 30.0–36.0)
MCV: 88.1 fL (ref 80.0–100.0)
Platelets: 386 10*3/uL (ref 150–400)
RBC: 5.3 MIL/uL (ref 4.22–5.81)
RDW: 16 % — ABNORMAL HIGH (ref 11.5–15.5)
WBC: 13.5 10*3/uL — ABNORMAL HIGH (ref 4.0–10.5)
nRBC: 0 % (ref 0.0–0.2)

## 2022-09-23 LAB — BRAIN NATRIURETIC PEPTIDE: B Natriuretic Peptide: 16.2 pg/mL (ref 0.0–100.0)

## 2022-09-23 LAB — CBG MONITORING, ED: Glucose-Capillary: 111 mg/dL — ABNORMAL HIGH (ref 70–99)

## 2022-09-23 MED ORDER — MORPHINE SULFATE (PF) 2 MG/ML IV SOLN
2.0000 mg | INTRAVENOUS | Status: DC | PRN
  Administered 2022-09-23 – 2022-09-29 (×22): 2 mg via INTRAVENOUS
  Filled 2022-09-23 (×24): qty 1

## 2022-09-23 MED ORDER — ONDANSETRON HCL 4 MG/2ML IJ SOLN: 4.0000 mg | Freq: Four times a day (QID) | INTRAMUSCULAR | Status: DC | PRN

## 2022-09-23 MED ORDER — ONDANSETRON HCL 4 MG PO TABS: 4.0000 mg | ORAL_TABLET | Freq: Four times a day (QID) | ORAL | Status: DC | PRN

## 2022-09-23 MED ORDER — INSULIN ASPART 100 UNIT/ML IJ SOLN
0.0000 [IU] | Freq: Three times a day (TID) | INTRAMUSCULAR | Status: DC
  Administered 2022-09-24: 3 [IU] via SUBCUTANEOUS
  Administered 2022-09-24: 4 [IU] via SUBCUTANEOUS
  Administered 2022-09-26: 3 [IU] via SUBCUTANEOUS
  Administered 2022-09-27 (×3): 4 [IU] via SUBCUTANEOUS
  Administered 2022-09-28 – 2022-09-29 (×3): 3 [IU] via SUBCUTANEOUS
  Filled 2022-09-23 (×9): qty 1

## 2022-09-23 MED ORDER — DEXTROSE-SODIUM CHLORIDE 5-0.45 % IV SOLN: INTRAVENOUS | Status: DC

## 2022-09-23 MED ORDER — INSULIN ASPART 100 UNIT/ML IJ SOLN: 0.0000 [IU] | Freq: Every day | INTRAMUSCULAR | Status: DC

## 2022-09-23 MED ORDER — OXYCODONE-ACETAMINOPHEN 5-325 MG PO TABS
2.0000 | ORAL_TABLET | Freq: Once | ORAL | Status: AC
  Administered 2022-09-23: 2 via ORAL
  Filled 2022-09-23: qty 2

## 2022-09-23 MED ORDER — ENOXAPARIN SODIUM 80 MG/0.8ML IJ SOSY
0.5000 mg/kg | PREFILLED_SYRINGE | INTRAMUSCULAR | Status: DC
  Administered 2022-09-23 – 2022-09-24 (×2): 77.5 mg via SUBCUTANEOUS
  Filled 2022-09-23 (×2): qty 0.78

## 2022-09-23 MED ORDER — POTASSIUM CHLORIDE 20 MEQ PO PACK
20.0000 meq | PACK | Freq: Once | ORAL | Status: AC
  Administered 2022-09-23: 20 meq via ORAL
  Filled 2022-09-23: qty 1

## 2022-09-23 MED ORDER — ENOXAPARIN SODIUM 40 MG/0.4ML IJ SOSY: 40.0000 mg | PREFILLED_SYRINGE | INTRAMUSCULAR | Status: DC

## 2022-09-23 NOTE — ED Notes (Signed)
When transferring the pt over from a stretcher to a hospital bed. It took 5 staff members to transfer the pt over to the bed since the pt was unable to assist. The pt was able to assist in sliding himself up with the bed in a trendelenburg position and the help of 2 staff members.

## 2022-09-23 NOTE — ED Triage Notes (Signed)
Pt here with bilateral leg pain x3 days. Pt denies falls or injuries. Pt denies NVD, but states he has been feeling warm but is unsure of fever. Pt states he has not been mobile for 3 days due to the pain.

## 2022-09-23 NOTE — Progress Notes (Signed)
PHARMACIST - PHYSICIAN COMMUNICATION  CONCERNING:  Enoxaparin (Lovenox) for DVT Prophylaxis    RECOMMENDATION: Patient was prescribed enoxaprin 40mg  q24 hours for VTE prophylaxis.   Filed Weights   09/23/22 1424  Weight: (!) 154 kg (339 lb 8.1 oz)    Body mass index is 46.05 kg/m.  Estimated Creatinine Clearance: 150.3 mL/min (A) (by C-G formula based on SCr of 0.58 mg/dL (L)).   Based on [facility] policy patient is candidate for enoxaparin 0.5mg /kg TBW SQ every 24 hours based on BMI being >30.  DESCRIPTION: Pharmacy has adjusted enoxaparin dose per [facility] policy.  Patient is now receiving enoxaparin 77.5 mg every 24 hours    Foye Deer, PharmD Clinical Pharmacist  09/23/2022 9:42 PM

## 2022-09-23 NOTE — ED Notes (Signed)
First nurse note: Pt here via [facility] from home with c/o bilateral leg pain from hips down X3 days. L shoulder pain for the past 5 months.   128/82 94% HR: 93 CBG 122

## 2022-09-23 NOTE — ED Provider Notes (Signed)
EMERGENCY DEPARTMENT AT [facility] Provider Note   CSN: [id number] Arrival date & time: 09/23/22  1411     History  Chief Complaint  Patient presents with   Leg Pain    James [name] is a 62 y.o. male.  With history of chronic pain syndrome, degenerative disc disease, bilateral hip osteoarthritis, lumbar spinal stenosis, diabetic neuropathy presents to the ER for evaluation of chronic pain.  He is being treated by pain clinic, is having severe pain in both hips.  Denies any falls trauma or injury.  Has some neuropathy in both legs but denies any new numbness tingling or weakness.  He had been receiving lumbar ESI injections but due to weight gain has been unable to receive these procedures.  He is prescribed oxycodone 10 mg 3 times daily for pain clinic but ran out of the pain medication yesterday.  Up until a couple days ago patient was ambulatory and able to care for himself but is now unable to stand or walk.  He was brought to the hospital via EMS.  No fevers chills vomiting or diarrhea.  No chest pain shortness of breath or abdominal pain.  Pain is focal to the right lateral hip, thigh groin and buttocks.  He denies any new numbness tingling or radicular symptoms.  No loss of bowel or bladder symptoms.  HPI     Home Medications Prior to Admission medications   Medication Sig Start Date End Date Taking? Authorizing Provider  albuterol (VENTOLIN HFA) 108 (90 Base) MCG/ACT inhaler INHALE 1 TO 2 PUFFS BY MOUTH EVERY 6 HOURS AS NEEDED FOR WHEEZING FOR SHORTNESS OF BREATH 05/04/22   Althea Charon, Netta Neat, DO  atorvastatin (LIPITOR) 40 MG tablet Take 1 tablet by mouth once daily 09/20/22   Althea Charon, Netta Neat, DO  Blood Glucose Monitoring Suppl (ONETOUCH VERIO) w/Device KIT Use to check blood sugar 1 x daily 06/28/22   Karamalegos, Netta Neat, DO  fluticasone-salmeterol (ADVAIR DISKUS) 250-50 MCG/ACT AEPB Inhale 1 puff into the lungs in the morning and at bedtime.  05/04/22   Karamalegos, Netta Neat, DO  furosemide (LASIX) 20 MG tablet TAKE 1 TABLET BY MOUTH ONCE DAILY AS NEEDED FOR FLUID OR EDEMA 03/15/22   Althea Charon, Netta Neat, DO  hydrochlorothiazide (HYDRODIURIL) 25 MG tablet Take 1 tablet (25 mg total) by mouth daily. 06/25/21   Karamalegos, Netta Neat, DO  Insulin Pen Needle (PEN NEEDLES 3/16") 31G X 5 MM MISC 1 each by Does not apply route 4 (four) times daily -  before meals and at bedtime. 06/21/22   Tresa Moore, MD  losartan (COZAAR) 25 MG tablet Take 1 tablet by mouth once daily 09/20/22   Althea Charon, Netta Neat, DO  metFORMIN (GLUCOPHAGE) 1000 MG tablet TAKE 1 TABLET BY MOUTH WITH BREAKFAST 09/20/22   Karamalegos, Netta Neat, DO  naloxone [facility]) nasal spray 4 mg/0.1 mL Place 1 spray into the nose as needed for up to 365 doses (for opioid-induced respiratory depresssion). In case of emergency (overdose), spray once into each nostril. If no response within 3 minutes, repeat application and call 911. 08/29/22 08/29/23  Delano Metz, MD  OneTouch Delica Lancets 33G MISC Use to check blood sugar 1 x daily 06/28/22   Althea Charon, Netta Neat, DO  [facility] VERIO test strip Use to check blood sugar 1 x daily 06/28/22   Karamalegos, Netta Neat, DO  Oxycodone HCl 10 MG TABS Take 1 tablet (10 mg total) by mouth in the morning and at bedtime. Must last  30 days. 08/29/22 09/28/22  Delano Metz, MD  Oxycodone HCl 10 MG TABS Take 1 tablet (10 mg total) by mouth in the morning and at bedtime. Must last 30 days. 09/28/22 10/28/22  Delano Metz, MD  Oxycodone HCl 10 MG TABS Take 1 tablet (10 mg total) by mouth in the morning and at bedtime. Must last 30 days. 10/28/22 11/27/22  Delano Metz, MD  OZEMPIC, 1 MG/DOSE, 4 MG/3ML SOPN INJECT 1MG  INTO THE SKIN ONCE A WEEK 06/21/22   Lolita Patella B, MD  pregabalin (LYRICA) 150 MG capsule TAKE 2 CAPSULES BY MOUTH IN THE MORNING AND 1 CAPSULE AT NIGHT 07/18/22   Karamalegos, Netta Neat, DO       Allergies    Patient has no known allergies.    Review of Systems   Review of Systems  Physical Exam Updated Vital Signs BP 131/87   Pulse 91   Temp 98.6 F (37 C) (Oral)   Resp (!) 22   Ht 6' (1.829 m)   Wt (!) 154 kg   SpO2 94%   BMI 46.05 kg/m  Physical Exam Constitutional:      Appearance: Normal appearance. He is well-developed. He is obese.  HENT:     Head: Normocephalic and atraumatic.     Nose: No congestion or rhinorrhea.     Mouth/Throat:     Pharynx: No oropharyngeal exudate or posterior oropharyngeal erythema.  Eyes:     Conjunctiva/sclera: Conjunctivae normal.  Cardiovascular:     Rate and Rhythm: Normal rate.  Pulmonary:     Effort: Pulmonary effort is normal. No respiratory distress.  Abdominal:     General: There is no distension.     Tenderness: There is no abdominal tenderness. There is no right CVA tenderness, left CVA tenderness or guarding.  Musculoskeletal:     Cervical back: Normal range of motion.     Comments: Patient with stiffness with bilateral hips with internal and external rotation.  No swelling or edema throughout both lower extremities.  He is able to maintain active knee extension bilaterally.  No swelling or edema in the lower legs calf foot or ankles.  2+ dorsalis pedis pulses.  Ankle plantar and dorsiflexion and EHL bilaterally.  Patient unable to ambulate.  Patient unable to transfer without 3 to 4 feet with max assist.  Skin:    General: Skin is warm.     Findings: No rash.  Neurological:     General: No focal deficit present.     Mental Status: He is alert and oriented to person, place, and time. Mental status is at baseline.  Psychiatric:        Behavior: Behavior normal.        Thought Content: Thought content normal.     ED Results / Procedures / Treatments   Labs (all labs ordered are listed, but only abnormal results are displayed) Labs Reviewed  CBC - Abnormal; Notable for the following components:      Result  Value   WBC 13.5 (*)    RDW 16.0 (*)    All other components within normal limits  COMPREHENSIVE METABOLIC PANEL - Abnormal; Notable for the following components:   Potassium 3.1 (*)    Chloride 94 (*)    Glucose, Bld 106 (*)    Creatinine, Ser 0.58 (*)    AST 13 (*)    All other components within normal limits  BRAIN NATRIURETIC PEPTIDE    EKG None  Radiology DG HIPS BILAT WITH  PELVIS MIN 5 VIEWS  Result Date: 09/23/2022 CLINICAL DATA:  Bilateral leg pain EXAM: DG HIP (WITH OR WITHOUT PELVIS) 5+V BILAT COMPARISON:  MRI 04/20/2021, radiograph 02/23/2021 FINDINGS: SI joints are non widened. pubic symphysis and rami appear intact. Left hip: Severe arthritis of the left hip with superior bone on bone appearance and subarticular sclerosis. Bulky spurring inferiorly. Right hip: IMPRESSION: Severe right greater than left hip arthritis without acute osseous abnormality Electronically Signed   By: Jasmine Pang M.D.   On: 09/23/2022 18:11    Procedures Procedures    Medications Ordered in ED Medications  oxyCODONE-acetaminophen (PERCOCET/ROXICET) 5-325 MG per tablet 2 tablet (2 tablets Oral Given 09/23/22 1645)  potassium chloride (KLOR-CON) packet 20 mEq (20 mEq Oral Given 09/23/22 1645)    ED Course/ Medical Decision Making/ A&P                                 Medical Decision Making Amount and/or Complexity of Data Reviewed Labs: ordered. Radiology: ordered.  Risk Prescription drug management.   62 year old male with advanced bilateral hip osteoarthritis.  He has chronic bilateral hip pain and back pain with stenosis.  Patient has had exacerbation of pain, has ran out of his pain medications that were prescribed by pain clinic.  He was taking oxycodone 10 mg twice a day.  Over the last few days pain is increased and he is needing more the medication and unfortunately ran out of the medication and now his pain is so severe he is unable to stand or walk.  Labs and vital signs are  stable.  He is afebrile.  No recent falls trauma or injury.  New x-rays of the hip show advanced degenerative changes but no acute fracture.  Due to uncontrolled pain and limited mobility and unable to care for himself will admit to the hospital service for placement.  Unable to discharge patient home. Final Clinical Impression(s) / ED Diagnoses Final diagnoses:  Hypokalemia  Uncontrolled pain  Unable to care for self  Unable to ambulate  Primary osteoarthritis of both hips    Rx / DC Orders ED Discharge Orders     None         Ronnette Juniper 09/23/22 Jennefer Bravo, MD 09/23/22 1946

## 2022-09-23 NOTE — ED Notes (Addendum)
This nurse is requesting something for pain since none was ordered at this time by instant message. Waiting for a response from the provider.

## 2022-09-23 NOTE — H&P (Signed)
History and Physical    Patient: James [name] LKG:[id number] DOB: 18-Nov-1960 DOA: 09/23/2022 DOS: the patient was seen and examined on 09/23/2022 PCP: Smitty Cords, DO  Patient coming from: Home  Chief Complaint:  Chief Complaint  Patient presents with   Leg Pain   HPI: James [name] is a 62 y.o. male with medical history significant of multiple joint osteoarthritis, diabetes, hyperlipidemia, essential hypertension, multiple surgeries, tobacco abuse, who presents to the ER with bilateral leg pain from hips down for 3 days.  He has had left shoulder pain for the past 5 months.  Patient unable to even get out of bed or to perform any function.  Patient unable to get care at home O2 functioning at home.  He appears to be failure to thrive as a result of the pain.  Despite initial treatment in the ER his pain has remained at 10 out of 10.  ER was unable to get patient on his feet.  As a result he is being admitted to the medical service for pain management as well as possible PT and OT.  Review of Systems: As mentioned in the history of present illness. All other systems reviewed and are negative. Past Medical History:  Diagnosis Date   Arthritis    Diabetes mellitus without complication (HCC)    Hyperlipidemia    Hypertension    Past Surgical History:  Procedure Laterality Date   APPENDECTOMY     COLONOSCOPY WITH PROPOFOL     COLONOSCOPY WITH PROPOFOL N/A 04/23/2021   Procedure: COLONOSCOPY WITH PROPOFOL;  Surgeon: Wyline Mood, MD;  Location: [facility] ENDOSCOPY;  Service: Gastroenterology;  Laterality: N/A;   Social History:  reports that he has been smoking cigarettes. He has a 2.5 pack-year smoking history. He has never used smokeless tobacco. He reports that he does not drink alcohol and does not use drugs.  No Known Allergies  Family History  Problem Relation Age of Onset   Heart disease Mother    Arthritis Mother    Arthritis Father     Prior to Admission  medications   Medication Sig Start Date End Date Taking? Authorizing Provider  albuterol (VENTOLIN HFA) 108 (90 Base) MCG/ACT inhaler INHALE 1 TO 2 PUFFS BY MOUTH EVERY 6 HOURS AS NEEDED FOR WHEEZING FOR SHORTNESS OF BREATH 05/04/22   Althea Charon, Netta Neat, DO  atorvastatin (LIPITOR) 40 MG tablet Take 1 tablet by mouth once daily 09/20/22   Althea Charon, Netta Neat, DO  Blood Glucose Monitoring Suppl (ONETOUCH VERIO) w/Device KIT Use to check blood sugar 1 x daily 06/28/22   Karamalegos, Netta Neat, DO  fluticasone-salmeterol (ADVAIR DISKUS) 250-50 MCG/ACT AEPB Inhale 1 puff into the lungs in the morning and at bedtime. 05/04/22   Karamalegos, Netta Neat, DO  furosemide (LASIX) 20 MG tablet TAKE 1 TABLET BY MOUTH ONCE DAILY AS NEEDED FOR FLUID OR EDEMA 03/15/22   Althea Charon, Netta Neat, DO  hydrochlorothiazide (HYDRODIURIL) 25 MG tablet Take 1 tablet (25 mg total) by mouth daily. 06/25/21   Karamalegos, Netta Neat, DO  Insulin Pen Needle (PEN NEEDLES 3/16") 31G X 5 MM MISC 1 each by Does not apply route 4 (four) times daily -  before meals and at bedtime. 06/21/22   Tresa Moore, MD  losartan (COZAAR) 25 MG tablet Take 1 tablet by mouth once daily 09/20/22   Althea Charon, Netta Neat, DO  metFORMIN (GLUCOPHAGE) 1000 MG tablet TAKE 1 TABLET BY MOUTH WITH BREAKFAST 09/20/22   Karamalegos, Netta Neat, DO  naloxone [facility])  nasal spray 4 mg/0.1 mL Place 1 spray into the nose as needed for up to 365 doses (for opioid-induced respiratory depresssion). In case of emergency (overdose), spray once into each nostril. If no response within 3 minutes, repeat application and call 911. 08/29/22 08/29/23  Delano Metz, MD  OneTouch Delica Lancets 33G MISC Use to check blood sugar 1 x daily 06/28/22   Althea Charon, Netta Neat, DO  [facility] VERIO test strip Use to check blood sugar 1 x daily 06/28/22   Karamalegos, Netta Neat, DO  Oxycodone HCl 10 MG TABS Take 1 tablet (10 mg total) by mouth in the morning and at  bedtime. Must last 30 days. 08/29/22 09/28/22  Delano Metz, MD  Oxycodone HCl 10 MG TABS Take 1 tablet (10 mg total) by mouth in the morning and at bedtime. Must last 30 days. 09/28/22 10/28/22  Delano Metz, MD  Oxycodone HCl 10 MG TABS Take 1 tablet (10 mg total) by mouth in the morning and at bedtime. Must last 30 days. 10/28/22 11/27/22  Delano Metz, MD  OZEMPIC, 1 MG/DOSE, 4 MG/3ML SOPN INJECT 1MG  INTO THE SKIN ONCE A WEEK 06/21/22   Tresa Moore, MD  pregabalin (LYRICA) 150 MG capsule TAKE 2 CAPSULES BY MOUTH IN THE MORNING AND 1 CAPSULE AT NIGHT 07/18/22   Smitty Cords, DO    Physical Exam: Vitals:   09/23/22 1423 09/23/22 1424 09/23/22 1544 09/23/22 1700  BP: 124/85  126/82 131/87  Pulse: 95  89 91  Resp: (!) 22  20 (!) 22  Temp: 98.6 F (37 C)     TempSrc: Oral     SpO2: 93%  94% 94%  Weight:  (!) 154 kg    Height:  6' (1.829 m)     Constitutional: Morbidly obese, NAD, calm, comfortable Eyes: PERRL, lids and conjunctivae normal ENMT: Mucous membranes are moist. Posterior pharynx clear of any exudate or lesions.Normal dentition.  Neck: normal, supple, no masses, no thyromegaly Respiratory: clear to auscultation bilaterally, no wheezing, no crackles. Normal respiratory effort. No accessory muscle use.  Cardiovascular: Regular rate and rhythm, no murmurs / rubs / gallops. No extremity edema. 2+ pedal pulses. No carotid bruits.  Abdomen: no tenderness, no masses palpated. No hepatosplenomegaly. Bowel sounds positive.  Musculoskeletal: Multiple bones tenderness with inability to bear weight good range of motion, no joint swelling or tenderness, Skin: no rashes, lesions, ulcers. No induration Neurologic: CN 2-12 grossly intact. Sensation intact, DTR normal. Strength 5/5 in all 4.  Psychiatric: Normal judgment and insight. Alert and oriented x 3. Normal mood  Data Reviewed:  Sodium is 136, potassium 3.1 chloride 94, creatinine 0.58.  White count is  13.5.  X-ray of the bilateral hips showed severe right greater than left hip arthritis without acute osseous abnormality  Assessment and Plan:  #1 adult failure to thrive: Secondary to severe osteoarthritis with pain.  Patient is unable to get up from 7 at home.  Will admit the patient for pain control.  PT and OT.  May need joint injections of steroids by orthopedics if that would help.  #2 hypokalemia: Continue to replete.  #3 diabetes: Sliding scale insulin will be ordered.  #4 essential hypertension: Resume and continue home regimen  #5 morbid obesity: Dietary counseling  #6 hyperlipidemia: Continue with statin.    Advance Care Planning:   Code Status: Full Code   Consults: None  Family Communication: No family at bedside  Severity of Illness: The appropriate patient status for this patient is  INPATIENT. Inpatient status is judged to be reasonable and necessary in order to provide the required intensity of service to ensure the patient's safety. The patient's presenting symptoms, physical exam findings, and initial radiographic and laboratory data in the context of their chronic comorbidities is felt to place them at high risk for further clinical deterioration. Furthermore, it is not anticipated that the patient will be medically stable for discharge from the hospital within 2 midnights of admission.   * I certify that at the point of admission it is my clinical judgment that the patient will require inpatient hospital care spanning beyond 2 midnights from the point of admission due to high intensity of service, high risk for further deterioration and high frequency of surveillance required.*  AuthorLonia Blood, MD 09/23/2022 7:02 PM  For on call review [email or web address].

## 2022-09-24 ENCOUNTER — Encounter: Payer: Self-pay | Admitting: Internal Medicine

## 2022-09-24 DIAGNOSIS — M79605 Pain in left leg: Secondary | ICD-10-CM

## 2022-09-24 DIAGNOSIS — M25561 Pain in right knee: Secondary | ICD-10-CM | POA: Diagnosis not present

## 2022-09-24 DIAGNOSIS — Z7952 Long term (current) use of systemic steroids: Secondary | ICD-10-CM | POA: Diagnosis not present

## 2022-09-24 DIAGNOSIS — M25512 Pain in left shoulder: Secondary | ICD-10-CM | POA: Diagnosis not present

## 2022-09-24 DIAGNOSIS — Z7984 Long term (current) use of oral hypoglycemic drugs: Secondary | ICD-10-CM | POA: Diagnosis not present

## 2022-09-24 DIAGNOSIS — I10 Essential (primary) hypertension: Secondary | ICD-10-CM | POA: Diagnosis present

## 2022-09-24 DIAGNOSIS — R627 Adult failure to thrive: Secondary | ICD-10-CM

## 2022-09-24 DIAGNOSIS — Z6841 Body Mass Index (BMI) 40.0 and over, adult: Secondary | ICD-10-CM | POA: Diagnosis not present

## 2022-09-24 DIAGNOSIS — M48061 Spinal stenosis, lumbar region without neurogenic claudication: Secondary | ICD-10-CM | POA: Diagnosis present

## 2022-09-24 DIAGNOSIS — M160 Bilateral primary osteoarthritis of hip: Secondary | ICD-10-CM | POA: Diagnosis present

## 2022-09-24 DIAGNOSIS — E1142 Type 2 diabetes mellitus with diabetic polyneuropathy: Secondary | ICD-10-CM

## 2022-09-24 DIAGNOSIS — E1169 Type 2 diabetes mellitus with other specified complication: Secondary | ICD-10-CM | POA: Diagnosis present

## 2022-09-24 DIAGNOSIS — M79604 Pain in right leg: Secondary | ICD-10-CM | POA: Diagnosis not present

## 2022-09-24 DIAGNOSIS — E876 Hypokalemia: Secondary | ICD-10-CM

## 2022-09-24 DIAGNOSIS — G608 Other hereditary and idiopathic neuropathies: Secondary | ICD-10-CM | POA: Diagnosis not present

## 2022-09-24 DIAGNOSIS — L299 Pruritus, unspecified: Secondary | ICD-10-CM | POA: Diagnosis not present

## 2022-09-24 DIAGNOSIS — T402X5A Adverse effect of other opioids, initial encounter: Secondary | ICD-10-CM | POA: Diagnosis not present

## 2022-09-24 DIAGNOSIS — M5442 Lumbago with sciatica, left side: Secondary | ICD-10-CM | POA: Diagnosis not present

## 2022-09-24 DIAGNOSIS — Z79899 Other long term (current) drug therapy: Secondary | ICD-10-CM | POA: Diagnosis not present

## 2022-09-24 DIAGNOSIS — Z23 Encounter for immunization: Secondary | ICD-10-CM | POA: Diagnosis not present

## 2022-09-24 DIAGNOSIS — Z8249 Family history of ischemic heart disease and other diseases of the circulatory system: Secondary | ICD-10-CM | POA: Diagnosis not present

## 2022-09-24 DIAGNOSIS — E785 Hyperlipidemia, unspecified: Secondary | ICD-10-CM | POA: Diagnosis present

## 2022-09-24 DIAGNOSIS — Z79891 Long term (current) use of opiate analgesic: Secondary | ICD-10-CM | POA: Diagnosis not present

## 2022-09-24 DIAGNOSIS — Z7985 Long-term (current) use of injectable non-insulin antidiabetic drugs: Secondary | ICD-10-CM | POA: Diagnosis not present

## 2022-09-24 DIAGNOSIS — G894 Chronic pain syndrome: Secondary | ICD-10-CM

## 2022-09-24 DIAGNOSIS — E662 Morbid (severe) obesity with alveolar hypoventilation: Secondary | ICD-10-CM | POA: Diagnosis present

## 2022-09-24 DIAGNOSIS — F17210 Nicotine dependence, cigarettes, uncomplicated: Secondary | ICD-10-CM | POA: Diagnosis present

## 2022-09-24 DIAGNOSIS — E1165 Type 2 diabetes mellitus with hyperglycemia: Secondary | ICD-10-CM | POA: Diagnosis present

## 2022-09-24 DIAGNOSIS — Z8261 Family history of arthritis: Secondary | ICD-10-CM | POA: Diagnosis not present

## 2022-09-24 LAB — CBC
HCT: 43.8 % (ref 39.0–52.0)
Hemoglobin: 14.2 g/dL (ref 13.0–17.0)
MCH: 29.1 pg (ref 26.0–34.0)
MCHC: 32.4 g/dL (ref 30.0–36.0)
MCV: 89.8 fL (ref 80.0–100.0)
Platelets: 336 10*3/uL (ref 150–400)
RBC: 4.88 MIL/uL (ref 4.22–5.81)
RDW: 15.9 % — ABNORMAL HIGH (ref 11.5–15.5)
WBC: 8.8 10*3/uL (ref 4.0–10.5)
nRBC: 0 % (ref 0.0–0.2)

## 2022-09-24 LAB — COMPREHENSIVE METABOLIC PANEL
ALT: 6 U/L (ref 0–44)
AST: 9 U/L — ABNORMAL LOW (ref 15–41)
Albumin: 3.2 g/dL — ABNORMAL LOW (ref 3.5–5.0)
Alkaline Phosphatase: 82 U/L (ref 38–126)
Anion gap: 9 (ref 5–15)
BUN: 8 mg/dL (ref 6–20)
CO2: 30 mmol/L (ref 22–32)
Calcium: 8.4 mg/dL — ABNORMAL LOW (ref 8.9–10.3)
Chloride: 102 mmol/L (ref 98–111)
Creatinine, Ser: 0.55 mg/dL — ABNORMAL LOW (ref 0.61–1.24)
GFR, Estimated: >60 mL/min (ref >60)
Glucose, Bld: 113 mg/dL — ABNORMAL HIGH (ref 70–99)
Potassium: 3 mmol/L — ABNORMAL LOW (ref 3.5–5.1)
Sodium: 141 mmol/L (ref 135–145)
Total Bilirubin: 0.8 mg/dL (ref 0.3–1.2)
Total Protein: 5.9 g/dL — ABNORMAL LOW (ref 6.5–8.1)

## 2022-09-24 LAB — MAGNESIUM: Magnesium: 1.6 mg/dL — ABNORMAL LOW (ref 1.7–2.4)

## 2022-09-24 LAB — GLUCOSE, CAPILLARY
Glucose-Capillary: 191 mg/dL — ABNORMAL HIGH (ref 70–99)
Glucose-Capillary: 82 mg/dL (ref 70–99)
Glucose-Capillary: 123 mg/dL — ABNORMAL HIGH (ref 70–99)
Glucose-Capillary: 156 mg/dL — ABNORMAL HIGH (ref 70–99)

## 2022-09-24 LAB — HIV ANTIBODY (ROUTINE TESTING W REFLEX): HIV Screen 4th Generation wRfx: NONREACTIVE

## 2022-09-24 MED ORDER — ALBUTEROL SULFATE (2.5 MG/3ML) 0.083% IN NEBU: 3.0000 mL | INHALATION_SOLUTION | RESPIRATORY_TRACT | Status: DC | PRN

## 2022-09-24 MED ORDER — HYDROCODONE-ACETAMINOPHEN 10-325 MG PO TABS
1.0000 | ORAL_TABLET | Freq: Three times a day (TID) | ORAL | Status: DC | PRN
  Administered 2022-09-24 – 2022-09-29 (×14): 1 via ORAL
  Filled 2022-09-24 (×14): qty 1

## 2022-09-24 MED ORDER — FLUTICASONE FUROATE-VILANTEROL 200-25 MCG/ACT IN AEPB
1.0000 | INHALATION_SPRAY | Freq: Every day | RESPIRATORY_TRACT | Status: DC
  Administered 2022-09-27: 1 via RESPIRATORY_TRACT
  Filled 2022-09-24: qty 28

## 2022-09-24 MED ORDER — MAGNESIUM SULFATE 4 GM/100ML IV SOLN
4.0000 g | Freq: Once | INTRAVENOUS | Status: AC
  Administered 2022-09-24: 4 g via INTRAVENOUS
  Filled 2022-09-24: qty 100

## 2022-09-24 MED ORDER — POTASSIUM CHLORIDE CRYS ER 20 MEQ PO TBCR
40.0000 meq | EXTENDED_RELEASE_TABLET | ORAL | Status: AC
  Administered 2022-09-24 (×2): 40 meq via ORAL
  Filled 2022-09-24 (×2): qty 2

## 2022-09-24 MED ORDER — ATORVASTATIN CALCIUM 20 MG PO TABS
40.0000 mg | ORAL_TABLET | Freq: Every day | ORAL | Status: DC
  Administered 2022-09-24 – 2022-09-29 (×6): 40 mg via ORAL
  Filled 2022-09-24 (×6): qty 2

## 2022-09-24 MED ORDER — CYCLOBENZAPRINE HCL 10 MG PO TABS
10.0000 mg | ORAL_TABLET | Freq: Three times a day (TID) | ORAL | Status: DC | PRN
  Administered 2022-09-24 – 2022-09-29 (×13): 10 mg via ORAL
  Filled 2022-09-24 (×13): qty 1

## 2022-09-24 MED ORDER — VARENICLINE TARTRATE 0.5 MG PO TABS
1.0000 mg | ORAL_TABLET | Freq: Two times a day (BID) | ORAL | Status: DC
  Administered 2022-09-24 – 2022-09-29 (×11): 1 mg via ORAL
  Filled 2022-09-24 (×11): qty 2

## 2022-09-24 MED ORDER — PREGABALIN 75 MG PO CAPS
200.0000 mg | ORAL_CAPSULE | Freq: Two times a day (BID) | ORAL | Status: DC
  Administered 2022-09-24 – 2022-09-29 (×11): 200 mg via ORAL
  Filled 2022-09-24 (×11): qty 1

## 2022-09-24 NOTE — Assessment & Plan Note (Signed)
Patient with hypokalemia and hypomagnesemia. He was taking Lasix at home. -Replete potassium and magnesium and monitor

## 2022-09-24 NOTE — Assessment & Plan Note (Signed)
-  Current continue statin

## 2022-09-24 NOTE — Evaluation (Signed)
Physical Therapy Evaluation Patient Details Name: James [name] MRN: [id number] DOB: Apr 14, 1960 Today's Date: 09/24/2022  History of Present Illness  Patient is a 62 year old male with failure to thrive secondary to severe osteoarthritis with pain. History significant of multiple joint osteoarthritis, diabetes, hyperlipidemia, essential hypertension, multiple surgeries, tobacco abuse.   Clinical Impression  Patient is agreeable to PT. He reports being in the bed 3-4 days at home due to increased leg pain. He lives alone and can typically get up to a power wheelchair. He reports he can walk short distance with the walker usually also.  Today, the patient required significant assistance for LE support to be able to achieve sitting position on edge of bed. Sitting balance is fair with no loss of balance. Patient reports he will be unable to attempt standing due to pain. After mobilizing, patient does report decreased overall pain. Recommend to continue PT to maximize independence and facilitate return to prior level of function. Anticipate patient could benefit from continued therapy <3 hours a day after this hospital stay.       If plan is discharge home, recommend the following: Two people to help with walking and/or transfers;A lot of help with bathing/dressing/bathroom;Assist for transportation;Assistance with cooking/housework   Can travel by private vehicle   No    Equipment Recommendations None recommended by PT  Recommendations for Other Services       Functional Status Assessment Patient has had a recent decline in their functional status and demonstrates the ability to make significant improvements in function in a reasonable and predictable amount of time.     Precautions / Restrictions Precautions Precautions: Fall Restrictions Weight Bearing Restrictions: No      Mobility  Bed Mobility Overal bed mobility: Needs Assistance Bed Mobility: Supine to Sit, Sit to Supine      Supine to sit: Max assist Sit to supine: Max assist   General bed mobility comments: assistance required for BLE support. verbal cues for technique. heavy use of bed rails and cues for sequencing    Transfers                   General transfer comment: not attempted. patient feels he would be unable to stand at this time secondary to pain    Ambulation/Gait                  Stairs            Wheelchair Mobility     Tilt Bed    Modified Rankin (Stroke Patients Only)       Balance Overall balance assessment: Needs assistance Sitting-balance support: Feet supported Sitting balance-Leahy Scale: Fair Sitting balance - Comments: no loss of balance with feet supported on floor                                     Pertinent Vitals/Pain Pain Assessment Pain Assessment: Faces Faces Pain Scale: Hurts even more Pain Location: bilateral legs (mostly R hip) and left shoulder Pain Descriptors / Indicators: Grimacing, Discomfort Pain Intervention(s): Limited activity within patient's tolerance, Monitored during session, Premedicated before session, Repositioned    Home Living Family/patient expects to be discharged to:: Private residence Living Arrangements: Alone Available Help at Discharge:  (none stated)   Home Access: Level entry       Home Layout: One level Home Equipment: Agricultural consultant (2 wheels);Wheelchair - power  Prior Function Prior Level of Function : Independent/Modified Independent             Mobility Comments: patient reports short distance ambulation with rolling walker. community distance with power wheelchair ADLs Comments: Mod I     Extremity/Trunk Assessment   Upper Extremity Assessment Upper Extremity Assessment: Generalized weakness    Lower Extremity Assessment Lower Extremity Assessment: Generalized weakness (AROM of BLE limited by pain. significant assistance required for routine  repositioning in bed)       Communication   Communication Communication: No apparent difficulties  Cognition Arousal: Alert Behavior During Therapy: WFL for tasks assessed/performed Overall Cognitive Status: Within Functional Limits for tasks assessed                                          General Comments General comments (skin integrity, edema, etc.): BLE repositioned on pillows at end of session. patient reports overall feeling much better after mobilizing and wants to try getting to the chair soon    Exercises     Assessment/Plan    PT Assessment Patient needs continued PT services  PT Problem List Decreased strength;Decreased range of motion;Decreased activity tolerance;Decreased balance;Decreased mobility;Decreased safety awareness;Pain       PT Treatment Interventions DME instruction;Gait training;Functional mobility training;Therapeutic activities;Therapeutic exercise;Balance training;Neuromuscular re-education;Patient/family education;Wheelchair mobility training    PT Goals (Current goals can be found in the Care Plan section)  Acute Rehab PT Goals Patient Stated Goal: to be able to stand and get to the chair PT Goal Formulation: With patient Time For Goal Achievement: 10/08/22 Potential to Achieve Goals: Fair    Frequency Min 1X/week     Co-evaluation               AM-PAC PT "6 Clicks" Mobility  Outcome Measure Help needed turning from your back to your side while in a flat bed without using bedrails?: A Lot Help needed moving from lying on your back to sitting on the side of a flat bed without using bedrails?: A Lot Help needed moving to and from a bed to a chair (including a wheelchair)?: Total Help needed standing up from a chair using your arms (e.g., wheelchair or bedside chair)?: Total Help needed to walk in hospital room?: Total Help needed climbing 3-5 steps with a railing? : Total 6 Click Score: 8    End of Session    Activity Tolerance: Patient limited by fatigue;Patient limited by pain Patient left: in bed;with call bell/phone within reach   PT Visit Diagnosis: Muscle weakness (generalized) (M62.81);Pain Pain - Right/Left:  (right and left) Pain - part of body: Leg;Shoulder (L shoulder, BLE)    Time: 1610-9604 PT Time Calculation (min) (ACUTE ONLY): 28 min   Charges:   PT Evaluation $PT Eval Low Complexity: 1 Low PT Treatments $Therapeutic Activity: 8-22 mins PT General Charges $$ ACUTE PT VISIT: 1 Visit        Donna Bernard, PT, MPT  [employer] [employer] 09/24/2022, 12:27 PM

## 2022-09-24 NOTE — Assessment & Plan Note (Signed)
Patient has an history of significant osteoarthritis and chronic lower extremity pain which has been increased to the point that he cannot walk anymore. Imaging negative for any acute fractures but did show severe osteoarthritis of bilateral hip. -Pain management -PT/OT evaluation-recommending rehab -Outpatient orthopedic evaluation, not sure whether he meets criteria for replacement because of weight

## 2022-09-24 NOTE — Hospital Course (Addendum)
Taken from H&P.  James [name] is a 62 y.o. male with medical history significant of multiple joint osteoarthritis, diabetes, hyperlipidemia, essential hypertension, multiple surgeries, tobacco abuse, and morbid obesity who presents to the ER with bilateral leg pain from hips down for 3 days. Patient unable to even get out of bed or to perform any function.    Hemodynamically stable but unable to stand on his feet in ER requiring multiple persons to help for transfer, being admitted for pain management and PT and OT evaluation.  9/7: Bilateral hip imaging with severe arthritis.  Starting on Norco and adding muscle relaxant.  Patient need to follow-up with orthopedic surgery as outpatient for definitive treatment, might not be a candidate for replacement based on weight. Hypokalemia and hypomagnesemia which are being repleted. PT and OT are recommending SNF.  9/8: Vital stable.  Hypomagnesemia and hypokalemia resolved.  9/9: Hemodynamically stable.  Awaiting disposition.  9/10: Still awaiting disposition.  Able to stand today.  9/11: Hemodynamically stable, should be able to go to [facility] tomorrow  9/12: Patient remained hemodynamically stable.  Patient is being discharged to rehab for further management.  Patient need outpatient orthopedic evaluation for her severe arthritis. He need to encourage weight loss to become an candidate for joint replacement treatment to improve the quality of his life. Significant pain with ambulation.  Patient experienced some itching with hydrocodone, with being discharged with oxycodone 10 mg to be used every 8 hourly as needed as he was doing it before and will continue home Lyrica to help.  He can also use as needed Flexeril.  Please avoid constipation with narcotic use.  He was given lactulose and MiraLAX to be used as needed.  He will continue with rest of his home medications and need to have a close follow-up with his providers for further  recommendations.

## 2022-09-24 NOTE — Evaluation (Signed)
Occupational Therapy Evaluation Patient Details Name: James [name] MRN: [id number] DOB: Sep 10, 1960 Today's Date: 09/24/2022   History of Present Illness Patient is a 62 year old male with failure to thrive secondary to severe osteoarthritis with pain. History significant of multiple joint osteoarthritis, diabetes, hyperlipidemia, essential hypertension, multiple surgeries, tobacco abuse   Clinical Impression   [name] presents today with 10/10 pain. He reports being in the bed 3-4 days at home due to increased leg pain. He lives alone and can typically get up to a power wheelchair. He also reports walking short distances in his home using a walker. States that he does his own dressing, bathing, toileting, cooking. He uses the bus system to go to the grocery store and pharmacy. He has been living in an apt with a level entry but states that he has been evicted from there this week. He had planned to move into his brother's home, who has 3 STE. Pt states he was planning to leave the power WC outside his brother's place and use only the RW indoors. During today's evaluation, pt was unable to move LE enough to come into sitting position, screaming with pain with any minor movement of either leg. Pt is able to perform ankle pumps but no other LE therex. Recommend OT while hospitalized. Given length of time pt has been basically immobile, + high level of pain at present, anticipate that pt will require further rehabilitation post DC, <3 hours/day.      If plan is discharge home, recommend the following: Two people to help with walking and/or transfers;Two people to help with bathing/dressing/bathroom;Assistance with cooking/housework;Assist for transportation;Help with stairs or ramp for entrance    Functional Status Assessment  Patient has had a recent decline in their functional status and demonstrates the ability to make significant improvements in function in a reasonable and predictable  amount of time.  Equipment Recommendations  None recommended by OT    Recommendations for Other Services       Precautions / Restrictions Precautions Precautions: Fall Restrictions Weight Bearing Restrictions: No      Mobility Bed Mobility Overal bed mobility: Needs Assistance Bed Mobility: Supine to Sit, Sit to Supine     Supine to sit: Max assist Sit to supine: Max assist   General bed mobility comments: Pt unable to get into fully upright sitting position.    Transfers Overall transfer level: Needs assistance                 General transfer comment: unable. Would require Max A +2      Balance Overall balance assessment: Needs assistance Sitting-balance support: Feet unsupported Sitting balance-Leahy Scale: Fair       Standing balance-Leahy Scale: Zero Standing balance comment: unable at this time                           ADL either performed or assessed with clinical judgement   ADL Overall ADL's : Needs assistance/impaired                                       General ADL Comments: Pt able to self feed following set up. Could perform grooming tasks at bed level using R UE. Max-Total A for LB dressing, toileting, bathing.     Vision         Perception  Praxis         Pertinent Vitals/Pain Pain Assessment Pain Assessment: 0-10 Faces Pain Scale: Hurts worst Pain Location: bilateral legs and left shoulder, increased with any movement Pain Descriptors / Indicators: Grimacing, Discomfort, Moaning, Aching Pain Intervention(s): Limited activity within patient's tolerance, Repositioned     Extremity/Trunk Assessment Upper Extremity Assessment Upper Extremity Assessment: Generalized weakness;LUE deficits/detail LUE Deficits / Details: shoulder forward flexion limited to ~ 60*   Lower Extremity Assessment Lower Extremity Assessment: Generalized weakness       Communication  Communication Communication: No apparent difficulties   Cognition Arousal: Alert Behavior During Therapy: WFL for tasks assessed/performed Overall Cognitive Status: Within Functional Limits for tasks assessed                                       General Comments  BLE repositioned on pillows at end of session. patient reports overall feeling much better after mobilizing and wants to try getting to the chair soon    Exercises     Shoulder Instructions      Home Living Family/patient expects to be discharged to:: Private residence Living Arrangements: Alone Available Help at Discharge:  (none stated) Type of Home: Apartment Home Access: Level entry     Home Layout: One level     Bathroom Shower/Tub: Producer, television/film/video: Handicapped height     Home Equipment: Agricultural consultant (2 wheels);Wheelchair - power   Additional Comments: Pt reports he has been evicted from this apartment, will not be able to return there. He hopes to be able to move in with his brother. 3 STE at his brother's house.      Prior Functioning/Environment Prior Level of Function : Independent/Modified Independent             Mobility Comments: patient reports short distance ambulation with rolling walker. community distance with power wheelchair ADLs Comments: Reports that he performs his own bathing, dressing, cooking. He takes bus to grocery story and pharmacy.        OT Problem List: Decreased range of motion;Decreased activity tolerance;Impaired balance (sitting and/or standing);Pain;Impaired UE functional use;Decreased strength      OT Treatment/Interventions: Self-care/ADL training;Therapeutic exercise;Patient/family education;Balance training;Therapeutic activities;DME and/or AE instruction    OT Goals(Current goals can be found in the care plan section) Acute Rehab OT Goals Patient Stated Goal: to go to rehab and get back to his normal OT Goal Formulation:  With patient Time For Goal Achievement: 10/08/22 Potential to Achieve Goals: Good ADL Goals Pt Will Perform Grooming: with modified independence;sitting Pt Will Transfer to Toilet: with min assist;regular height toilet;ambulating (using LRAD) Pt/caregiver will Perform Home Exercise Program: Increased ROM;Increased strength;Right Upper extremity;Left upper extremity;Independently (to improve strength, flexibility, fxl mobility)  OT Frequency: Min 1X/week    Co-evaluation              AM-PAC OT "6 Clicks" Daily Activity     Outcome Measure Help from another person eating meals?: A Little Help from another person taking care of personal grooming?: A Little Help from another person toileting, which includes using toliet, bedpan, or urinal?: A Lot Help from another person bathing (including washing, rinsing, drying)?: A Lot Help from another person to put on and taking off regular upper body clothing?: A Lot Help from another person to put on and taking off regular lower body clothing?: A Lot 6 Click Score: 14  End of Session    Activity Tolerance: Patient limited by pain Patient left: in chair  OT Visit Diagnosis: Muscle weakness (generalized) (M62.81);Pain;Unsteadiness on feet (R26.81) Pain - part of body: Shoulder;Hip;Knee;Leg;Ankle and joints of foot                Time: 1244-1305 OT Time Calculation (min): 21 min Charges:  OT General Charges $OT Visit: 1 Visit OT Evaluation $OT Eval Low Complexity: 1 Low Latina Craver, PhD, MS, OTR/L 09/24/22, 1:23 PM

## 2022-09-24 NOTE — Progress Notes (Signed)
Progress Note   Patient: James [name] ZOX:[id number] DOB: Nov 18, 1960 DOA: 09/23/2022     0 DOS: the patient was seen and examined on 09/24/2022   Brief hospital course: Taken from H&P.  James [name] is a 62 y.o. male with medical history significant of multiple joint osteoarthritis, diabetes, hyperlipidemia, essential hypertension, multiple surgeries, tobacco abuse, and morbid obesity who presents to the ER with bilateral leg pain from hips down for 3 days. Patient unable to even get out of bed or to perform any function.    Hemodynamically stable but unable to stand on his feet in ER requiring multiple persons to help for transfer, being admitted for pain management and PT and OT evaluation.    Assessment and Plan: * Chronic lower extremity pain (1ry area of Pain) (Bilateral) Patient has an history of significant osteoarthritis and chronic lower extremity pain which has been increased to the point that he cannot walk anymore. Imaging negative for any acute fractures but did show severe osteoarthritis of bilateral hip. -Pain management -PT/OT evaluation-recommending rehab -Outpatient orthopedic evaluation, not sure whether he meets criteria for replacement because of weight  Polyneuropathy, peripheral sensorimotor axonal -Continue home Lyrica  Osteoarthritis of hips (Bilateral) Chronic osteoarthritis of multiple joints. -Pain management -PT/OT  Chronic pain syndrome Patient is on chronic opioid therapy at home due to chronic pain involving multiple joints.  He was on oxycodone 10 mg at home and states that it is not working well now. -Starting on Norco 10 mg every 6 hourly as needed -Adding Flexeril  FTT (failure to thrive) in adult Multifactorial due to severe osteoarthritis which interfere with his mobility and he is lying down all the time. -Pain control  Hypokalemia Patient with hypokalemia and hypomagnesemia. He was taking Lasix at home. -Replete potassium and  magnesium and monitor  Type 2 diabetes mellitus with morbid obesity (HCC) Seems uncontrolled with hyperglycemia and A1c was 12.9 52-month ago Patient was on Ozempic and metformin at home -Continue with SSI -Check A1c  Essential hypertension Blood pressure within lower goal. -Currently holding home losartan, HCTZ and Lasix -Can resume home medications if blood pressure started trending up  Hyperlipidemia associated with type 2 diabetes mellitus (HCC) -Current continue statin  Class 3 obesity with alveolar hypoventilation, serious comorbidity, and body mass index (BMI) of 45.0 to 49.9 in adult [facility]) Estimated body mass index is 46.05 kg/m as calculated from the following:   Height as of this encounter: 6' (1.829 m).   Weight as of this encounter: 154 kg.   -This will complicate overall prognosis -Patient should encourage for weight loss   Subjective: Patient was complaining of bilateral leg pain, right more than left.  Physical Exam: Vitals:   09/23/22 1936 09/23/22 2002 09/23/22 2132 09/24/22 0816  BP: 124/63 (!) 119/59 109/63 101/61  Pulse: 96 (!) 104 97 83  Resp:   18 16  Temp: 98.3 F (36.8 C)  98.5 F (36.9 C) 98.3 F (36.8 C)  TempSrc: Oral     SpO2: 93% 95% 97% 93%  Weight:      Height:       General.  Morbidly obese gentleman, in no acute distress. Pulmonary.  Lungs clear bilaterally, normal respiratory effort. CV.  Regular rate and rhythm, no JVD, rub or murmur. Abdomen.  Soft, nontender, nondistended, BS positive. CNS.  Alert and oriented .  No focal neurologic deficit. Extremities.  Trace LE edema, no cyanosis, pulses intact and symmetrical. Psychiatry.  Judgment and insight appears normal.   Data  Reviewed: Prior data reviewed  Family Communication: Discussed with patient  Disposition: Status is: Inpatient Remains inpatient appropriate because: Need rehab placement  Planned Discharge Destination: Skilled nursing facility  Time spent: 45  minutes  This record has been created using Conservation officer, historic buildings. Errors have been sought and corrected,but may not always be located. Such creation errors do not reflect on the standard of care.   Author: Arnetha Courser, MD 09/24/2022 2:39 PM  For on call review [email or web address].

## 2022-09-24 NOTE — Assessment & Plan Note (Signed)
Patient is on chronic opioid therapy at home due to chronic pain involving multiple joints.  He was on oxycodone 10 mg at home and states that it is not working well now. -Starting on Norco 10 mg every 6 hourly as needed -Adding Flexeril

## 2022-09-24 NOTE — TOC CM/SW Note (Signed)
Transition of Care [facility]) - Inpatient Brief Assessment   Patient Details  Name: James [name] MRN: [id number] Date of Birth: 01/24/1960  Transition of Care [facility]) CM/SW Contact:    Kemper Durie, RN Phone Number: 09/24/2022, 3:59 PM   Clinical Narrative:  Brief assessment done, attempted to speak with patient directly regarding recommendations for SNF, patient not available. TOC will continue to follow for discharge planning  Transition of Care Asessment: Insurance and Status: Insurance coverage has been reviewed Patient has primary care physician: Yes Home environment has been reviewed: Yes Prior level of function:: indpendent with assistance from brother and neighbors per previous TOC note, also has DME Prior/Current Home Services: No current home services Social Determinants of Health Reivew: SDOH reviewed no interventions necessary Readmission risk has been reviewed: Yes Transition of care needs: transition of care needs identified, TOC will continue to follow

## 2022-09-24 NOTE — Assessment & Plan Note (Signed)
Blood pressure within lower goal. -Currently holding home losartan, HCTZ and Lasix -Can resume home medications if blood pressure started trending up

## 2022-09-24 NOTE — Assessment & Plan Note (Signed)
Estimated body mass index is 46.05 kg/m as calculated from the following:   Height as of this encounter: 6' (1.829 m).   Weight as of this encounter: 154 kg.   -This will complicate overall prognosis -Patient should encourage for weight loss

## 2022-09-24 NOTE — Assessment & Plan Note (Signed)
Chronic osteoarthritis of multiple joints. -Pain management -PT/OT

## 2022-09-24 NOTE — Assessment & Plan Note (Signed)
Multifactorial due to severe osteoarthritis which interfere with his mobility and he is lying down all the time. -Pain control

## 2022-09-24 NOTE — Assessment & Plan Note (Signed)
-

## 2022-09-24 NOTE — Plan of Care (Signed)

## 2022-09-24 NOTE — Assessment & Plan Note (Signed)
Seems uncontrolled with hyperglycemia and A1c was 12.9 61-month ago Patient was on Ozempic and metformin at home -Continue with SSI -Check A1c

## 2022-09-25 DIAGNOSIS — G608 Other hereditary and idiopathic neuropathies: Secondary | ICD-10-CM | POA: Diagnosis not present

## 2022-09-25 DIAGNOSIS — E1142 Type 2 diabetes mellitus with diabetic polyneuropathy: Secondary | ICD-10-CM | POA: Diagnosis not present

## 2022-09-25 DIAGNOSIS — E876 Hypokalemia: Secondary | ICD-10-CM | POA: Diagnosis not present

## 2022-09-25 DIAGNOSIS — M79604 Pain in right leg: Secondary | ICD-10-CM | POA: Diagnosis not present

## 2022-09-25 LAB — BASIC METABOLIC PANEL
Anion gap: 7 (ref 5–15)
BUN: 9 mg/dL (ref 6–20)
CO2: 31 mmol/L (ref 22–32)
Calcium: 8.4 mg/dL — ABNORMAL LOW (ref 8.9–10.3)
Chloride: 101 mmol/L (ref 98–111)
Creatinine, Ser: 0.57 mg/dL — ABNORMAL LOW (ref 0.61–1.24)
GFR, Estimated: >60 mL/min (ref >60)
Glucose, Bld: 122 mg/dL — ABNORMAL HIGH (ref 70–99)
Potassium: 3.7 mmol/L (ref 3.5–5.1)
Sodium: 139 mmol/L (ref 135–145)

## 2022-09-25 LAB — GLUCOSE, CAPILLARY
Glucose-Capillary: 112 mg/dL — ABNORMAL HIGH (ref 70–99)
Glucose-Capillary: 122 mg/dL — ABNORMAL HIGH (ref 70–99)
Glucose-Capillary: 107 mg/dL — ABNORMAL HIGH (ref 70–99)
Glucose-Capillary: 134 mg/dL — ABNORMAL HIGH (ref 70–99)

## 2022-09-25 LAB — HEMOGLOBIN A1C
Hgb A1c MFr Bld: 7 % — ABNORMAL HIGH (ref 4.8–5.6)
Mean Plasma Glucose: 154.2 mg/dL

## 2022-09-25 LAB — MAGNESIUM: Magnesium: 2.2 mg/dL (ref 1.7–2.4)

## 2022-09-25 LAB — PHOSPHORUS: Phosphorus: 3.5 mg/dL (ref 2.5–4.6)

## 2022-09-25 MED ORDER — ENOXAPARIN SODIUM 80 MG/0.8ML IJ SOSY
80.0000 mg | PREFILLED_SYRINGE | INTRAMUSCULAR | Status: DC
  Administered 2022-09-25 – 2022-09-28 (×4): 80 mg via SUBCUTANEOUS
  Filled 2022-09-25 (×4): qty 0.8

## 2022-09-25 NOTE — Progress Notes (Signed)
Physical Therapy Treatment Patient Details Name: James [name] MRN: [id number] DOB: [birth year] Today's Date: 09/25/2022   History of Present Illness Patient is a 62 year old male with failure to thrive secondary to severe osteoarthritis with pain. History significant of multiple joint osteoarthritis, diabetes, hyperlipidemia, essential hypertension, multiple surgeries, tobacco abuse    PT Comments  C/o pain R LE but wanting to try.  To EOB with mod a x 1 and heavy assist of rail and bed features.  Generally steady in sitting with arm support.  He is noted to be inc and bed linens are changed during standing attempts.  Max a x 2 to stand from elevated bed.  Slightly clears bottom but thighs remains heavily supported by bed but linens are changed by RN during attempt.  Standing time is brief about 15 seconds each try.  Returns to supine with max a x 2 but is able to pull himself up in bed after with rails to reposition.     If plan is discharge home, recommend the following: Two people to help with walking and/or transfers;A lot of help with bathing/dressing/bathroom;Assist for transportation;Assistance with cooking/housework;Help with stairs or ramp for entrance   Can travel by private vehicle     No  Equipment Recommendations  None recommended by PT    Recommendations for Other Services       Precautions / Restrictions Precautions Precautions: Fall Restrictions Weight Bearing Restrictions: No     Mobility  Bed Mobility Overal bed mobility: Needs Assistance Bed Mobility: Supine to Sit     Supine to sit: Mod assist          Transfers Overall transfer level: Needs assistance Equipment used: Rolling walker (2 wheels) Transfers: Sit to/from Stand Sit to Stand: Max assist, +2 physical assistance                Ambulation/Gait         Gait velocity: dec     General Gait Details: unable   Stairs             Wheelchair Mobility     Tilt Bed     Modified Rankin (Stroke Patients Only)       Balance Overall balance assessment: Needs assistance Sitting-balance support: Feet unsupported Sitting balance-Leahy Scale: Fair       Standing balance-Leahy Scale: Zero Standing balance comment: unable at this time                            Cognition Arousal: Alert Behavior During Therapy: WFL for tasks assessed/performed Overall Cognitive Status: Within Functional Limits for tasks assessed                                          Exercises      General Comments        Pertinent Vitals/Pain Pain Assessment Pain Assessment: Faces Faces Pain Scale: Hurts whole lot Pain Location: bilateral legs and left shoulder, increased with any movement Pain Descriptors / Indicators: Grimacing, Discomfort, Moaning, Aching Pain Intervention(s): Limited activity within patient's tolerance, Repositioned    Home Living                          Prior Function            PT Goals (current goals can  now be found in the care plan section) Progress towards PT goals: Progressing toward goals    Frequency    Min 1X/week      PT Plan      Co-evaluation              AM-PAC PT "6 Clicks" Mobility   Outcome Measure  Help needed turning from your back to your side while in a flat bed without using bedrails?: A Lot Help needed moving from lying on your back to sitting on the side of a flat bed without using bedrails?: A Lot Help needed moving to and from a bed to a chair (including a wheelchair)?: Total Help needed standing up from a chair using your arms (e.g., wheelchair or bedside chair)?: Total Help needed to walk in hospital room?: Total Help needed climbing 3-5 steps with a railing? : Total 6 Click Score: 8    End of Session Equipment Utilized During Treatment: Gait belt Activity Tolerance: Patient limited by fatigue;Patient limited by pain Patient left: in bed;with call  bell/phone within reach;with bed alarm set Nurse Communication: Mobility status PT Visit Diagnosis: Muscle weakness (generalized) (M62.81);Pain Pain - Right/Left: Right Pain - part of body: Leg;Shoulder     Time: 1248-1310 PT Time Calculation (min) (ACUTE ONLY): 22 min  Charges:    $Therapeutic Exercise: 8-22 mins PT General Charges $$ ACUTE PT VISIT: 1 Visit                   Danielle Dess, PTA 09/25/22, 3:04 PM

## 2022-09-25 NOTE — NC FL2 (Signed)
[place] MEDICAID FL2 LEVEL OF CARE FORM     IDENTIFICATION  Patient Name: James [name] Birthdate: 1960-01-31 Sex: male Admission Date (Current Location): 09/23/2022  [facility] and [place] Number:  Chiropodist and Address:  [facility], [address], [place], [place] [address]      Provider Number: [id number]  Attending Physician Name and Address:  Arnetha Courser, MD  Relative Name and Phone Number:  [place], [facility])  [phone]    Current Level of Care: Hospital Recommended Level of Care: Skilled Nursing Facility Prior Approval Number:    Date Approved/Denied:   PASRR Number: [id number]  Discharge Plan: SNF    Current Diagnoses: Patient Active Problem List   Diagnosis Date Noted   FTT (failure to thrive) in adult 09/23/2022   Hypokalemia 09/23/2022   Cellulitis 06/18/2022   Candidal intertrigo 06/18/2022   Hyperosmolar hyperglycemic state (HHS) (HCC) 06/17/2022   Chronic, continuous use of opioids 06/17/2022   Hyponatremia 06/17/2022   Hypercapnia 06/17/2022   Generalized weakness 06/17/2022   Asthma 03/01/2022   Dyspnea on exertion 03/01/2022   Essential hypertension 03/01/2022   Hypercholesterolemia 03/01/2022   Mild persistent asthma without complication 02/15/2022   Class 3 severe obesity with serious comorbidity and body mass index (BMI) of 50.0 to 59.9 in adult [facility]) 02/15/2022   Chronic thigh pain (Right) 12/14/2021   Class 3 obesity with alveolar hypoventilation, serious comorbidity, and body mass index (BMI) of 45.0 to 49.9 in adult (HCC) 11/04/2021   Epidural lipomatosis (L1-2 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) 11/03/2021   Lumbar facet arthropathy (Multilevel) (L1-2 through L5-S1) (Bilateral) 11/03/2021   Lumbar central spinal stenosis, 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 11/03/2021   Lumbar foraminal stenosis (Bilateral: L3-4, L4-5) (Left: L2-3) 11/03/2021   Lumbar lateral recess stenosis (Right: L3-4) (Bilateral: L4-5) 11/03/2021    Lumbar nerve root impingement (Right: L4) 11/03/2021   Herniated nucleus pulposus, L3-4 (Right) 11/03/2021   Hyperlipidemia associated with type 2 diabetes mellitus (HCC) 06/25/2021   Abnormal MRI, lumbar spine (05/21/2021) 05/24/2021   Chronic arthropathy 05/24/2021   Pes planus 05/24/2021   Osteoarthritis of AC (acromioclavicular) joint (Left) 04/29/2021   Osteoarthritis of shoulder (Left) 04/29/2021   Rotator cuff arthropathy of shoulder (Left) 04/29/2021   Lumbosacral facet hypertrophy (L4-5, L5-S1) 04/29/2021   Lumbar facet syndrome 04/29/2021   Abnormal MRI, hip (04/21/2021) 04/29/2021   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) 04/29/2021   Abnormal EMG (electromyogram) (03/16/2021) 04/29/2021   Polyneuropathy, 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   Diabetic sensorimotor polyneuropathy (HCC) 04/29/2021   Diabetic neuropathy (HCC) 04/26/2021   Osteoarthritis of hips (Bilateral) 03/29/2021   Osteoarthritis of hip (Left) 03/29/2021   Osteoarthritis of hip (Right) 03/29/2021   Osteoarthritis of knee (Left) 03/29/2021   Elevated sed rate 03/29/2021   Vitamin D deficiency 03/29/2021   Elevated C-reactive protein (CRP) 03/29/2021   Lumbosacral radiculopathy at L5 (Left) 03/29/2021   Fall (03/22/2021) 03/29/2021   Pharmacologic therapy 02/15/2021   Disorder of skeletal system 02/15/2021   Problems influencing health status 02/15/2021   Chronic use of opiate for therapeutic purpose 02/15/2021   Type 2 diabetes mellitus with morbid obesity (HCC) 02/15/2021   Chronic lower extremity pain (1ry area of Pain) (Bilateral) 02/15/2021   Chronic low back pain (4th area of Pain) (Bilateral) w/ sciatica (Bilateral) 02/15/2021   Chronic shoulder pain (5th area of Pain) (Left) 02/15/2021   Chronic hip pain (2ry area of Pain) (Bilateral) 02/15/2021   Chronic knee pain (3ry area of Pain) (Bilateral) 02/15/2021   Weakness of lower  extremities (Bilateral) 01/15/2021   Recurrent falls  01/15/2021   Chronic pain syndrome 11/09/2020   DDD (degenerative disc disease), lumbar 11/09/2020   Primary osteoarthritis involving multiple joints 11/09/2020   Diabetes mellitus (HCC) 11/09/2020    Orientation RESPIRATION BLADDER Height & Weight     Self, Time, Situation, Place  Normal Continent Weight: (!) 339 lb 8.1 oz (154 kg) Height:  6' (182.9 cm)  BEHAVIORAL SYMPTOMS/MOOD NEUROLOGICAL BOWEL NUTRITION STATUS      Continent Diet (Carb modified)  AMBULATORY STATUS COMMUNICATION OF NEEDS Skin   Extensive Assist Verbally Normal                       Personal Care Assistance Level of Assistance  Bathing, Feeding, Dressing Bathing Assistance: Maximum assistance Feeding assistance: Independent Dressing Assistance: Maximum assistance     Functional Limitations Info  Sight, Hearing, Speech Sight Info: Adequate Hearing Info: Adequate Speech Info: Adequate    SPECIAL CARE FACTORS FREQUENCY  PT (By licensed PT), OT (By licensed OT)     PT Frequency: X5 Weekly OT Frequency: X5 Weekly            Contractures Contractures Info: Not present    Additional Factors Info  Code Status, Allergies Code Status Info: Full Allergies Info: No known listed           Current Medications (09/25/2022):  This is the current hospital active medication list Current Facility-Administered Medications  Medication Dose Route Frequency Provider Last Rate Last Admin   albuterol (PROVENTIL) (2.5 MG/3ML) 0.083% nebulizer solution 3 mL  3 mL Nebulization Q4H PRN Arnetha Courser, MD       atorvastatin (LIPITOR) tablet 40 mg  40 mg Oral Daily Arnetha Courser, MD   40 mg at 09/25/22 0848   cyclobenzaprine (FLEXERIL) tablet 10 mg  10 mg Oral TID PRN Arnetha Courser, MD   10 mg at 09/25/22 1501   enoxaparin (LOVENOX) injection 80 mg  80 mg Subcutaneous Q24H Arnetha Courser, MD       fluticasone furoate-vilanterol (BREO ELLIPTA) 200-25 MCG/ACT 1 puff  1 puff Inhalation Daily Arnetha Courser, MD        HYDROcodone-acetaminophen (NORCO) 10-325 MG per tablet 1 tablet  1 tablet Oral Q8H PRN Arnetha Courser, MD   1 tablet at 09/25/22 1501   insulin aspart (novoLOG) injection 0-20 Units  0-20 Units Subcutaneous TID WC Earlie Lou L, MD   3 Units at 09/24/22 1739   insulin aspart (novoLOG) injection 0-5 Units  0-5 Units Subcutaneous QHS Garba, Mohammad L, MD       morphine (PF) 2 MG/ML injection 2 mg  2 mg Intravenous Q2H PRN Earlie Lou L, MD   2 mg at 09/25/22 1330   ondansetron (ZOFRAN) tablet 4 mg  4 mg Oral Q6H PRN Rometta Emery, MD       Or   ondansetron (ZOFRAN) injection 4 mg  4 mg Intravenous Q6H PRN Earlie Lou L, MD       pregabalin (LYRICA) capsule 200 mg  200 mg Oral BID Arnetha Courser, MD   200 mg at 09/25/22 0849   varenicline (CHANTIX) tablet 1 mg  1 mg Oral BID Arnetha Courser, MD   1 mg at 09/25/22 6045     Discharge Medications: Please see discharge summary for a list of discharge medications.  Relevant Imaging Results:  Relevant Lab Results:   Additional Information SSN: [id number]  Susa Simmonds, LCSWA

## 2022-09-25 NOTE — Assessment & Plan Note (Signed)
Resolved. He was taking Lasix at home. -Continue to monitor.

## 2022-09-25 NOTE — TOC Progression Note (Signed)
Transition of Care [facility]) - Progression Note    Patient Details  Name: James [name] MRN: [id number] Date of Birth: Aug 18, 1960  Transition of Care [facility]) CM/SW Contact  Susa Simmonds, [place] Phone Number: 09/25/2022, 4:44 PM  Clinical Narrative:  CSW spoke with patient who agrees with SNF recommendations. Patient stated he has medicare and medicaid. CSW doesn't see his medicaid listed on the chart. Patient is asking for housing resources and stated he has no place to go after rehab. Patients states he needs long term care. SNF bed search started.          Expected Discharge Plan and Services                                               Social Determinants of Health (SDOH) Interventions SDOH Screenings   Food Insecurity: No Food Insecurity (09/24/2022)  Housing: Low Risk  (09/24/2022)  Transportation Needs: No Transportation Needs (09/24/2022)  Utilities: Not At Risk (09/24/2022)  Alcohol Screen: Low Risk  (05/04/2022)  Depression (PHQ2-9): Low Risk  (05/25/2022)  Financial Resource Strain: Low Risk  (03/25/2022)  Physical Activity: Inactive (03/25/2022)  Social Connections: Socially Isolated (03/25/2022)  Stress: No Stress Concern Present (03/25/2022)  Tobacco Use: High Risk (09/24/2022)    Readmission Risk Interventions     No data to display

## 2022-09-25 NOTE — Plan of Care (Signed)
  Problem: Nutritional: Goal: Maintenance of adequate nutrition will improve Outcome: Progressing   Problem: Nutrition: Goal: Adequate nutrition will be maintained Outcome: Progressing   Problem: Coping: Goal: Level of anxiety will decrease Outcome: Progressing

## 2022-09-25 NOTE — Progress Notes (Signed)
Progress Note   Patient: James [name] GEX:[id number] DOB: Jul 03, 1960 DOA: 09/23/2022     1 DOS: the patient was seen and examined on 09/25/2022   Brief hospital course: Taken from H&P.  James [name] is a 62 y.o. male with medical history significant of multiple joint osteoarthritis, diabetes, hyperlipidemia, essential hypertension, multiple surgeries, tobacco abuse, and morbid obesity who presents to the ER with bilateral leg pain from hips down for 3 days. Patient unable to even get out of bed or to perform any function.    Hemodynamically stable but unable to stand on his feet in ER requiring multiple persons to help for transfer, being admitted for pain management and PT and OT evaluation.  9/7: Bilateral hip imaging with severe arthritis.  Starting on Norco and adding muscle relaxant.  Patient need to follow-up with orthopedic surgery as outpatient for definitive treatment, might not be a candidate for replacement based on weight. Hypokalemia and hypomagnesemia which are being repleted. PT and OT are recommending SNF.  9/8: Vital stable.  Hypomagnesemia and hypokalemia resolved.   Assessment and Plan: * Chronic lower extremity pain (1ry area of Pain) (Bilateral) Patient has an history of significant osteoarthritis and chronic lower extremity pain which has been increased to the point that he cannot walk anymore. Imaging negative for any acute fractures but did show severe osteoarthritis of bilateral hip. -Pain management -PT/OT evaluation-recommending rehab -Outpatient orthopedic evaluation, not sure whether he meets criteria for replacement because of weight  Polyneuropathy, peripheral sensorimotor axonal -Continue home Lyrica  Osteoarthritis of hips (Bilateral) Chronic osteoarthritis of multiple joints. -Pain management -PT/OT  Chronic pain syndrome Patient is on chronic opioid therapy at home due to chronic pain involving multiple joints.  He was on oxycodone 10 mg  at home and states that it is not working well now. -Starting on Norco 10 mg every 6 hourly as needed -Adding Flexeril  FTT (failure to thrive) in adult Multifactorial due to severe osteoarthritis which interfere with his mobility and he is lying down all the time. -Pain control  Hypokalemia Resolved. He was taking Lasix at home. -Continue to monitor.  Type 2 diabetes mellitus with morbid obesity (HCC) Seems uncontrolled with hyperglycemia and A1c was 12.9 40-month ago Patient was on Ozempic and metformin at home -Continue with SSI -Check A1c  Essential hypertension Blood pressure within lower goal. -Currently holding home losartan, HCTZ and Lasix -Can resume home medications if blood pressure started trending up  Hyperlipidemia associated with type 2 diabetes mellitus (HCC) -Current continue statin  Class 3 obesity with alveolar hypoventilation, serious comorbidity, and body mass index (BMI) of 45.0 to 49.9 in adult [facility]) Estimated body mass index is 46.05 kg/m as calculated from the following:   Height as of this encounter: 6' (1.829 m).   Weight as of this encounter: 154 kg.   -This will complicate overall prognosis -Patient should encourage for weight loss   Subjective: Patient continued to complain about bilateral leg pain stating it is 10/10 but he was sleeping comfortably when I entered the room.  Physical Exam: Vitals:   09/24/22 0816 09/24/22 1508 09/24/22 2122 09/25/22 0805  BP: 101/61 134/85 133/80 133/73  Pulse: 83 77 80 66  Resp: 16 16 20 16   Temp: 98.3 F (36.8 C) 98.4 F (36.9 C) 98.2 F (36.8 C) 98 F (36.7 C)  TempSrc:   Oral   SpO2: 93% 94% 93% 91%  Weight:      Height:       General.  Morbidly obese gentleman, in no acute distress. Pulmonary.  Lungs clear bilaterally, normal respiratory effort. CV.  Regular rate and rhythm, no JVD, rub or murmur. Abdomen.  Soft, nontender, nondistended, BS positive. CNS.  Alert and oriented .  No focal  neurologic deficit. Extremities.  No edema, no cyanosis, pulses intact and symmetrical. Psychiatry.  Judgment and insight appears normal.   Data Reviewed: Prior data reviewed  Family Communication: Discussed with patient  Disposition: Status is: Inpatient Remains inpatient appropriate because: Need rehab placement  Planned Discharge Destination: Skilled nursing facility  Time spent: 44 minutes  This record has been created using Conservation officer, historic buildings. Errors have been sought and corrected,but may not always be located. Such creation errors do not reflect on the standard of care.   Author: Arnetha Courser, MD 09/25/2022 1:30 PM  For on call review [email or web address].

## 2022-09-25 NOTE — Plan of Care (Signed)
  Problem: Fluid Volume: Goal: Ability to maintain a balanced intake and output will improve Outcome: Not Progressing   Problem: Metabolic: Goal: Ability to maintain appropriate glucose levels will improve Outcome: Not Progressing   Problem: Activity: Goal: Risk for activity intolerance will decrease Outcome: Not Progressing   Problem: Pain Managment: Goal: General experience of comfort will improve Outcome: Not Progressing   Problem: Safety: Goal: Ability to remain free from injury will improve Outcome: Not Progressing

## 2022-09-26 DIAGNOSIS — G608 Other hereditary and idiopathic neuropathies: Secondary | ICD-10-CM | POA: Diagnosis not present

## 2022-09-26 DIAGNOSIS — E876 Hypokalemia: Secondary | ICD-10-CM | POA: Diagnosis not present

## 2022-09-26 DIAGNOSIS — M79604 Pain in right leg: Secondary | ICD-10-CM | POA: Diagnosis not present

## 2022-09-26 DIAGNOSIS — E1142 Type 2 diabetes mellitus with diabetic polyneuropathy: Secondary | ICD-10-CM | POA: Diagnosis not present

## 2022-09-26 LAB — GLUCOSE, CAPILLARY
Glucose-Capillary: 114 mg/dL — ABNORMAL HIGH (ref 70–99)
Glucose-Capillary: 132 mg/dL — ABNORMAL HIGH (ref 70–99)
Glucose-Capillary: 118 mg/dL — ABNORMAL HIGH (ref 70–99)
Glucose-Capillary: 128 mg/dL — ABNORMAL HIGH (ref 70–99)

## 2022-09-26 NOTE — Plan of Care (Signed)
  Problem: Education: Goal: Ability to describe self-care measures that may prevent or decrease complications (Diabetes Survival Skills Education) will improve Outcome: Progressing Goal: Individualized Educational Video(s) Outcome: Progressing   Problem: Coping: Goal: Ability to adjust to condition or change in health will improve Outcome: Progressing   Problem: Fluid Volume: Goal: Ability to maintain a balanced intake and output will improve Outcome: Progressing   Problem: Health Behavior/Discharge Planning: Goal: Ability to identify and utilize available resources and services will improve Outcome: Progressing Goal: Ability to manage health-related needs will improve Outcome: Progressing   Problem: Metabolic: Goal: Ability to maintain appropriate glucose levels will improve Outcome: Progressing   Problem: Nutritional: Goal: Maintenance of adequate nutrition will improve Outcome: Progressing Goal: Progress toward achieving an optimal weight will improve Outcome: Progressing   Problem: Skin Integrity: Goal: Risk for impaired skin integrity will decrease Outcome: Progressing   Problem: Tissue Perfusion: Goal: Adequacy of tissue perfusion will improve Outcome: Progressing   Problem: Education: Goal: Knowledge of General Education information will improve Description: Including pain rating scale, medication(s)/side effects and non-pharmacologic comfort measures Outcome: Progressing   Problem: Health Behavior/Discharge Planning: Goal: Ability to manage health-related needs will improve Outcome: Progressing   Problem: Clinical Measurements: Goal: Ability to maintain clinical measurements within normal limits will improve Outcome: Progressing Goal: Will remain free from infection Outcome: Progressing Goal: Diagnostic test results will improve Outcome: Progressing Goal: Respiratory complications will improve Outcome: Progressing Goal: Cardiovascular complication will  be avoided Outcome: Progressing   Problem: Activity: Goal: Risk for activity intolerance will decrease Outcome: Progressing   Problem: Nutrition: Goal: Adequate nutrition will be maintained Outcome: Progressing   Problem: Coping: Goal: Level of anxiety will decrease Outcome: Progressing   Problem: Elimination: Goal: Will not experience complications related to bowel motility Outcome: Progressing Goal: Will not experience complications related to urinary retention Outcome: Progressing   Problem: Pain Managment: Goal: General experience of comfort will improve Outcome: Progressing   Problem: Safety: Goal: Ability to remain free from injury will improve Outcome: Progressing   Problem: Skin Integrity: Goal: Risk for impaired skin integrity will decrease Outcome: Progressing   Problem: Education: Goal: Ability to describe self-care measures that may prevent or decrease complications (Diabetes Survival Skills Education) will improve Outcome: Progressing Goal: Individualized Educational Video(s) Outcome: Progressing   Problem: Cardiac: Goal: Ability to maintain an adequate cardiac output will improve Outcome: Progressing   Problem: Health Behavior/Discharge Planning: Goal: Ability to identify and utilize available resources and services will improve Outcome: Progressing Goal: Ability to manage health-related needs will improve Outcome: Progressing   Problem: Fluid Volume: Goal: Ability to achieve a balanced intake and output will improve Outcome: Progressing   Problem: Metabolic: Goal: Ability to maintain appropriate glucose levels will improve Outcome: Progressing   Problem: Nutritional: Goal: Maintenance of adequate nutrition will improve Outcome: Progressing Goal: Maintenance of adequate weight for body size and type will improve Outcome: Progressing   Problem: Respiratory: Goal: Will regain and/or maintain adequate ventilation Outcome: Progressing    Problem: Urinary Elimination: Goal: Ability to achieve and maintain adequate renal perfusion and functioning will improve Outcome: Progressing   

## 2022-09-26 NOTE — Progress Notes (Signed)
Progress Note   Patient: James [name] [id number]:[id number] DOB: Feb 25, 1960 DOA: 09/23/2022     2 DOS: the patient was seen and examined on 09/26/2022   Brief hospital course: Taken from H&P.  James [name] is a 62 y.o. male with medical history significant of multiple joint osteoarthritis, diabetes, hyperlipidemia, essential hypertension, multiple surgeries, tobacco abuse, and morbid obesity who presents to the ER with bilateral leg pain from hips down for 3 days. Patient unable to even get out of bed or to perform any function.    Hemodynamically stable but unable to stand on his feet in ER requiring multiple persons to help for transfer, being admitted for pain management and PT and OT evaluation.  9/7: Bilateral hip imaging with severe arthritis.  Starting on Norco and adding muscle relaxant.  Patient need to follow-up with orthopedic surgery as outpatient for definitive treatment, might not be a candidate for replacement based on weight. Hypokalemia and hypomagnesemia which are being repleted. PT and OT are recommending SNF.  9/8: Vital stable.  Hypomagnesemia and hypokalemia resolved.  9/9: Hemodynamically stable.  Awaiting disposition.   Assessment and Plan: * Chronic lower extremity pain (1ry area of Pain) (Bilateral) Patient has an history of significant osteoarthritis and chronic lower extremity pain which has been increased to the point that he cannot walk anymore. Imaging negative for any acute fractures but did show severe osteoarthritis of bilateral hip. -Pain management -PT/OT evaluation-recommending rehab -Outpatient orthopedic evaluation, not sure whether he meets criteria for replacement because of weight  Polyneuropathy, peripheral sensorimotor axonal -Continue home Lyrica  Osteoarthritis of hips (Bilateral) Chronic osteoarthritis of multiple joints. -Pain management -PT/OT  Chronic pain syndrome Patient is on chronic opioid therapy at home due to chronic pain  involving multiple joints.  He was on oxycodone 10 mg at home and states that it is not working well now. -Starting on Norco 10 mg every 6 hourly as needed -Adding Flexeril  FTT (failure to thrive) in adult Multifactorial due to severe osteoarthritis which interfere with his mobility and he is lying down all the time. -Pain control  Hypokalemia Resolved. He was taking Lasix at home. -Continue to monitor.  Type 2 diabetes mellitus with morbid obesity (HCC) Seems uncontrolled with hyperglycemia and A1c was 12.9 74-month ago Patient was on Ozempic and metformin at home -Continue with SSI -Check A1c  Essential hypertension Blood pressure within lower goal. -Currently holding home losartan, HCTZ and Lasix -Can resume home medications if blood pressure started trending up  Hyperlipidemia associated with type 2 diabetes mellitus (HCC) -Current continue statin  Class 3 obesity with alveolar hypoventilation, serious comorbidity, and body mass index (BMI) of 45.0 to 49.9 in adult [facility]) Estimated body mass index is 46.05 kg/m as calculated from the following:   Height as of this encounter: 6' (1.829 m).   Weight as of this encounter: 154 kg.   -This will complicate overall prognosis -Patient should encourage for weight loss   Subjective: Patient continued to have pain involving different parts of body.  Physical Exam: Vitals:   09/25/22 0805 09/25/22 1523 09/25/22 2351 09/26/22 0932  BP: 133/73 135/78 132/87 125/86  Pulse: 66 72 74 75  Resp: 16 14 18 18   Temp: 98 F (36.7 C) 97.9 F (36.6 C) 98 F (36.7 C) 97.6 F (36.4 C)  TempSrc:      SpO2: 91% 98% 94% 96%  Weight:      Height:       General.  Morbidly obese gentleman, in no  acute distress. Pulmonary.  Lungs clear bilaterally, normal respiratory effort. CV.  Regular rate and rhythm, no JVD, rub or murmur. Abdomen.  Soft, nontender, nondistended, BS positive. CNS.  Alert and oriented .  No focal neurologic  deficit. Extremities.  No edema, no cyanosis, pulses intact and symmetrical. Psychiatry.  Judgment and insight appears normal.   Data Reviewed: Prior data reviewed  Family Communication: Discussed with patient  Disposition: Status is: Inpatient Remains inpatient appropriate because: Need rehab placement  Planned Discharge Destination: Skilled nursing facility  Time spent: 42 minutes  This record has been created using Conservation officer, historic buildings. Errors have been sought and corrected,but may not always be located. Such creation errors do not reflect on the standard of care.   Author: Arnetha Courser, MD 09/26/2022 2:55 PM  For on call review [email or web address].

## 2022-09-26 NOTE — TOC Progression Note (Signed)
Transition of Care [facility]) - Progression Note    Patient Details  Name: James [name] MRN: [id number] Date of Birth: 1960-09-30  Transition of Care [facility]) CM/SW Contact  Marlowe Sax, RN Phone Number: 09/26/2022, 4:54 PM  Clinical Narrative:     NO bed offers yet, resent the search       Expected Discharge Plan and Services                                               Social Determinants of Health (SDOH) Interventions SDOH Screenings   Food Insecurity: No Food Insecurity (09/24/2022)  Housing: Low Risk  (09/24/2022)  Transportation Needs: No Transportation Needs (09/24/2022)  Utilities: Not At Risk (09/24/2022)  Alcohol Screen: Low Risk  (05/04/2022)  Depression (PHQ2-9): Low Risk  (05/25/2022)  Financial Resource Strain: Low Risk  (03/25/2022)  Physical Activity: Inactive (03/25/2022)  Social Connections: Socially Isolated (03/25/2022)  Stress: No Stress Concern Present (03/25/2022)  Tobacco Use: High Risk (09/24/2022)    Readmission Risk Interventions     No data to display

## 2022-09-27 DIAGNOSIS — E1142 Type 2 diabetes mellitus with diabetic polyneuropathy: Secondary | ICD-10-CM | POA: Diagnosis not present

## 2022-09-27 DIAGNOSIS — M79604 Pain in right leg: Secondary | ICD-10-CM | POA: Diagnosis not present

## 2022-09-27 DIAGNOSIS — G608 Other hereditary and idiopathic neuropathies: Secondary | ICD-10-CM | POA: Diagnosis not present

## 2022-09-27 DIAGNOSIS — E876 Hypokalemia: Secondary | ICD-10-CM | POA: Diagnosis not present

## 2022-09-27 LAB — GLUCOSE, CAPILLARY
Glucose-Capillary: 105 mg/dL — ABNORMAL HIGH (ref 70–99)
Glucose-Capillary: 187 mg/dL — ABNORMAL HIGH (ref 70–99)
Glucose-Capillary: 162 mg/dL — ABNORMAL HIGH (ref 70–99)
Glucose-Capillary: 154 mg/dL — ABNORMAL HIGH (ref 70–99)
Glucose-Capillary: 154 mg/dL — ABNORMAL HIGH (ref 70–99)
Glucose-Capillary: 110 mg/dL — ABNORMAL HIGH (ref 70–99)

## 2022-09-27 MED ORDER — POLYETHYLENE GLYCOL 3350 17 G PO PACK
17.0000 g | PACK | Freq: Two times a day (BID) | ORAL | Status: DC
  Administered 2022-09-27 – 2022-09-29 (×4): 17 g via ORAL
  Filled 2022-09-27 (×5): qty 1

## 2022-09-27 NOTE — Consult Note (Signed)
[facility] Customer service manager [facility]) Accountable Care Organization (ACO) [facility] Liaison Note  09/27/2022  Mako Zhao [birth year] [id number]  Location: [facility] RN Hospital Liaison screened the patient remotely at [facility].  Insurance: [employer]   Suleman Wools is a 62 y.o. male who is a Primary Care Patient of Althea Charon, Netta Neat, DO-[facility]). The patient was screened for readmission hospitalization with noted medium risk score for unplanned readmission risk with 2 IP in 6 months.  The patient was assessed for potential [facility]) Care Management service needs for post hospital transition for care coordination. Review of patient's electronic medical record reveals patient was admitted for Hypokalemia. Pt recommended for ongoing rehabilitation for SNF level of care. Pt pending bed offers offers for placement. Facility will address pt's ongoing needs post discharge to the facility of choice. Will collaborate with PAC-RN accordingly at the time of discharge.   Plan: [facility] Liaison will continue to follow progress and disposition to asess for post hospital community care coordination/management needs.  Referral request for community care coordination: pending disposition.   [facility] Care Management/Population Health does not replace or interfere with any arrangements made by the Inpatient Transition of Care team.   For questions contact:   Elliot Cousin, RN, [facility] Liaison [place]   Population Health Office Hours MTWF  8:00 am-6:00 pm [phone] mobile [phone] [Office toll free line] Office Hours are M-F 8:30 - 5 pm [email or web address]

## 2022-09-27 NOTE — Progress Notes (Addendum)
Physical Therapy Treatment Patient Details Name: James [name] MRN: [id number] DOB: [birth year] Today's Date: 09/27/2022   History of Present Illness Patient is a 62 year old male with failure to thrive secondary to severe osteoarthritis with pain. History significant of multiple joint osteoarthritis, diabetes, hyperlipidemia, essential hypertension, multiple surgeries, tobacco abuse    PT Comments  Pt sitting EOB upon arrival with assist from nursing.  He is wanting to get to the chair.  He does stand x 3 from elevated surface to RW and mod a x 2 with much effort.  He is able to take one poor quality sidestep but is unable to take functional steps for transfer to recliner.  Michiel Sites is obtained and pad is placed in sitting and lift to recliner with good tolerance.  Overlap with OT for transfer and nurse techs in room for lift training so they are able to return pt to bed later.  Pt happy to be OOB in recliner.  Pt is motivated to improve mobility and independence.   If plan is discharge home, recommend the following: Two people to help with walking and/or transfers;A lot of help with bathing/dressing/bathroom;Assist for transportation;Assistance with cooking/housework;Help with stairs or ramp for entrance   Can travel by private vehicle        Equipment Recommendations  None recommended by PT    Recommendations for Other Services       Precautions / Restrictions Precautions Precautions: Fall Restrictions Weight Bearing Restrictions: No     Mobility  Bed Mobility               General bed mobility comments: sitting EOB upon arrival with assit from nursing    Transfers Overall transfer level: Needs assistance Equipment used: Rolling walker (2 wheels) Transfers: Sit to/from Stand, Bed to chair/wheelchair/BSC Sit to Stand: Min assist, Mod assist, +2 physical assistance, From elevated surface           General transfer comment: needed Hoyer lift to chair     Ambulation/Gait Ambulation/Gait assistance: Mod assist, Min assist, +2 physical assistance Gait Distance (Feet): 1 Feet Assistive device: Rolling walker (2 wheels) Gait Pattern/deviations: Step-to pattern Gait velocity: dec     General Gait Details: takes one poor quality step in standing but not functional for transfer to chair   Stairs             Wheelchair Mobility     Tilt Bed    Modified Rankin (Stroke Patients Only)       Balance Overall balance assessment: Needs assistance Sitting-balance support: Feet unsupported Sitting balance-Leahy Scale: Fair Sitting balance - Comments: no loss of balance with feet supported on floor   Standing balance support: Bilateral upper extremity supported Standing balance-Leahy Scale: Poor Standing balance comment: +2 assist needed for safety                            Cognition Arousal: Alert Behavior During Therapy: WFL for tasks assessed/performed Overall Cognitive Status: Within Functional Limits for tasks assessed                                          Exercises      General Comments        Pertinent Vitals/Pain Pain Assessment Pain Assessment: Faces Faces Pain Scale: Hurts whole lot Pain Location: bilateral legs mostly right >left Pain  Descriptors / Indicators: Grimacing, Discomfort, Moaning, Aching Pain Intervention(s): Limited activity within patient's tolerance, Monitored during session, Repositioned    Home Living                          Prior Function            PT Goals (current goals can now be found in the care plan section) Progress towards PT goals: Progressing toward goals    Frequency    Min 1X/week      PT Plan      Co-evaluation              AM-PAC PT "6 Clicks" Mobility   Outcome Measure  Help needed turning from your back to your side while in a flat bed without using bedrails?: A Lot Help needed moving from lying on  your back to sitting on the side of a flat bed without using bedrails?: A Lot Help needed moving to and from a bed to a chair (including a wheelchair)?: Total Help needed standing up from a chair using your arms (e.g., wheelchair or bedside chair)?: Total Help needed to walk in hospital room?: Total Help needed climbing 3-5 steps with a railing? : Total 6 Click Score: 8    End of Session Equipment Utilized During Treatment: Gait belt Activity Tolerance: Patient tolerated treatment well Patient left: with call bell/phone within reach;in chair Nurse Communication: Mobility status PT Visit Diagnosis: Muscle weakness (generalized) (M62.81);Pain Pain - Right/Left: Right Pain - part of body: Leg;Shoulder     Time: 6962-9528 PT Time Calculation (min) (ACUTE ONLY): 24 min  Charges:    $Therapeutic Activity: 23-37 mins PT General Charges $$ ACUTE PT VISIT: 1 Visit                   Danielle Dess, PTA 09/27/22, 11:15 AM

## 2022-09-27 NOTE — Progress Notes (Signed)
Occupational Therapy Treatment Patient Details Name: James [name] MRN: [id number] DOB: 05-28-1960 Today's Date: 09/27/2022   History of present illness Patient is a 62 year old male with failure to thrive secondary to severe osteoarthritis with pain. History significant of multiple joint osteoarthritis, diabetes, hyperlipidemia, essential hypertension, multiple surgeries, tobacco abuse   OT comments  Upon entering the room, pt seated on EOB with PT and nursing in room. Pt stands from elevated surface with min -mod of 2 and use of RW. While standing hoyer pad and chux placed under him. Pt does take 1 side step to the L but is unable to get to chair and sits on hoyer sling. OT and PT educated nursing tech in room on utilizing hoyer for safety transfer. Pt placed in chair and hoyer sling left under him in order for him to safely return to bed with staff at later time. Call bell and all needed items within reach upon exiting the room.       If plan is discharge home, recommend the following:  Two people to help with walking and/or transfers;Two people to help with bathing/dressing/bathroom;Assistance with cooking/housework;Assist for transportation;Help with stairs or ramp for entrance   Equipment Recommendations  None recommended by OT       Precautions / Restrictions Precautions Precautions: Fall       Mobility Bed Mobility               General bed mobility comments: seated on EOB with nursing and PT when therapist enters the room    Transfers Overall transfer level: Needs assistance Equipment used: Rolling walker (2 wheels) Transfers: Sit to/from Stand, Bed to chair/wheelchair/BSC Sit to Stand: Min assist, Mod assist, +2 physical assistance, From elevated surface           General transfer comment: needed Hoyer lift to chair     Balance Overall balance assessment: Needs assistance Sitting-balance support: Feet unsupported Sitting balance-Leahy Scale: Fair      Standing balance support: Bilateral upper extremity supported Standing balance-Leahy Scale: Poor                             ADL either performed or assessed with clinical judgement    Extremity/Trunk Assessment Upper Extremity Assessment Upper Extremity Assessment: Generalized weakness            Vision Patient Visual Report: No change from baseline            Cognition Arousal: Alert Behavior During Therapy: WFL for tasks assessed/performed Overall Cognitive Status: Within Functional Limits for tasks assessed                                                     Pertinent Vitals/ Pain       Pain Assessment Pain Assessment: Faces Faces Pain Scale: Hurts whole lot Pain Location: bilateral legs mostly right >left Pain Descriptors / Indicators: Grimacing, Discomfort, Moaning, Aching Pain Intervention(s): Limited activity within patient's tolerance, Monitored during session, Repositioned         Frequency  Min 1X/week        Progress Toward Goals  OT Goals(current goals can now be found in the care plan section)  Progress towards OT goals: Progressing toward goals      AM-PAC OT "6 Clicks" Daily Activity  Outcome Measure   Help from another person eating meals?: A Little Help from another person taking care of personal grooming?: A Little Help from another person toileting, which includes using toliet, bedpan, or urinal?: Total Help from another person bathing (including washing, rinsing, drying)?: A Lot Help from another person to put on and taking off regular upper body clothing?: A Lot Help from another person to put on and taking off regular lower body clothing?: Total 6 Click Score: 12    End of Session Equipment Utilized During Treatment: Rolling walker (2 wheels);Gait belt  OT Visit Diagnosis: Muscle weakness (generalized) (M62.81);Pain;Unsteadiness on feet (R26.81)   Activity Tolerance Patient tolerated  treatment well   Patient Left in chair;with call bell/phone within reach   Nurse Communication Mobility status        Time: 4098-1191 OT Time Calculation (min): 12 min  Charges: OT General Charges $OT Visit: 1 Visit OT Treatments $Therapeutic Activity: 8-22 mins  Jackquline Denmark, MS, OTR/L , CBIS ascom [phone]  09/27/22, 1:54 PM

## 2022-09-27 NOTE — Progress Notes (Signed)
Progress Note   Patient: James [name] [id number] DOB: December 17, 1960 DOA: 09/23/2022     3 DOS: the patient was seen and examined on 09/27/2022   Brief hospital course: Taken from H&P.  James [name] is a 62 y.o. male with medical history significant of multiple joint osteoarthritis, diabetes, hyperlipidemia, essential hypertension, multiple surgeries, tobacco abuse, and morbid obesity who presents to the ER with bilateral leg pain from hips down for 3 days. Patient unable to even get out of bed or to perform any function.    Hemodynamically stable but unable to stand on his feet in ER requiring multiple persons to help for transfer, being admitted for pain management and PT and OT evaluation.  9/7: Bilateral hip imaging with severe arthritis.  Starting on Norco and adding muscle relaxant.  Patient need to follow-up with orthopedic surgery as outpatient for definitive treatment, might not be a candidate for replacement based on weight. Hypokalemia and hypomagnesemia which are being repleted. PT and OT are recommending SNF.  9/8: Vital stable.  Hypomagnesemia and hypokalemia resolved.  9/9: Hemodynamically stable.  Awaiting disposition.  9/10: Still awaiting disposition.  Able to stand today   Assessment and Plan: * Chronic lower extremity pain (1ry area of Pain) (Bilateral) Patient has an history of significant osteoarthritis and chronic lower extremity pain which has been increased to the point that he cannot walk anymore. Imaging negative for any acute fractures but did show severe osteoarthritis of bilateral hip. -Pain management -PT/OT evaluation-recommending rehab -Outpatient orthopedic evaluation, not sure whether he meets criteria for replacement because of weight  Polyneuropathy, peripheral sensorimotor axonal -Continue home Lyrica  Osteoarthritis of hips (Bilateral) Chronic osteoarthritis of multiple joints. -Pain management -PT/OT  Chronic pain  syndrome Patient is on chronic opioid therapy at home due to chronic pain involving multiple joints.  He was on oxycodone 10 mg at home and states that it is not working well now. -Starting on Norco 10 mg every 6 hourly as needed -Adding Flexeril  FTT (failure to thrive) in adult Multifactorial due to severe osteoarthritis which interfere with his mobility and he is lying down all the time. -Pain control  Hypokalemia Resolved. He was taking Lasix at home. -Continue to monitor.  Type 2 diabetes mellitus with morbid obesity (HCC) Seems uncontrolled with hyperglycemia and A1c was 12.9 22-month ago Patient was on Ozempic and metformin at home -Continue with SSI -Check A1c  Essential hypertension Blood pressure within lower goal. -Currently holding home losartan, HCTZ and Lasix -Can resume home medications if blood pressure started trending up  Hyperlipidemia associated with type 2 diabetes mellitus (HCC) -Current continue statin  Class 3 obesity with alveolar hypoventilation, serious comorbidity, and body mass index (BMI) of 45.0 to 49.9 in adult [facility]) Estimated body mass index is 46.05 kg/m as calculated from the following:   Height as of this encounter: 6' (1.829 m).   Weight as of this encounter: 154 kg.   -This will complicate overall prognosis -Patient should encourage for weight loss   Subjective: Patient continued to have pain in different joint, stating that he was able to stand today.  Still unable to walk  Physical Exam: Vitals:   09/26/22 0932 09/26/22 1716 09/26/22 2354 09/27/22 0830  BP: 125/86 (!) 152/88 139/84 127/84  Pulse: 75 80 75 83  Resp: 18 15 20 17   Temp: 97.6 F (36.4 C) 98.1 F (36.7 C) 98.1 F (36.7 C) 97.8 F (36.6 C)  TempSrc:      SpO2: 96% 96% 99%  90%  Weight:      Height:       General.  Morbidly obese gentleman, in no acute distress. Pulmonary.  Lungs clear bilaterally, normal respiratory effort. CV.  Regular rate and rhythm, no JVD,  rub or murmur. Abdomen.  Soft, nontender, nondistended, BS positive. CNS.  Alert and oriented .  No focal neurologic deficit. Extremities.  No edema, no cyanosis, pulses intact and symmetrical. Psychiatry.  Judgment and insight appears normal.   Data Reviewed: Prior data reviewed  Family Communication: Discussed with patient  Disposition: Status is: Inpatient Remains inpatient appropriate because: Need rehab placement  Planned Discharge Destination: Skilled nursing facility  Time spent: 40 minutes  This record has been created using Conservation officer, historic buildings. Errors have been sought and corrected,but may not always be located. Such creation errors do not reflect on the standard of care.   Author: Arnetha Courser, MD 09/27/2022 1:32 PM  For on call review [email or web address].

## 2022-09-27 NOTE — Plan of Care (Signed)
  Problem: Coping: Goal: Ability to adjust to condition or change in health will improve Outcome: Progressing   Problem: Fluid Volume: Goal: Ability to maintain a balanced intake and output will improve Outcome: Progressing   Problem: Metabolic: Goal: Ability to maintain appropriate glucose levels will improve Outcome: Progressing   Problem: Nutritional: Goal: Maintenance of adequate nutrition will improve Outcome: Progressing   Problem: Skin Integrity: Goal: Risk for impaired skin integrity will decrease Outcome: Progressing   Problem: Tissue Perfusion: Goal: Adequacy of tissue perfusion will improve Outcome: Progressing   Problem: Education: Goal: Knowledge of General Education information will improve Description: Including pain rating scale, medication(s)/side effects and non-pharmacologic comfort measures Outcome: Progressing   Problem: Clinical Measurements: Goal: Will remain free from infection Outcome: Progressing Goal: Diagnostic test results will improve Outcome: Progressing Goal: Respiratory complications will improve Outcome: Progressing Goal: Cardiovascular complication will be avoided Outcome: Progressing   Problem: Activity: Goal: Risk for activity intolerance will decrease Outcome: Progressing   Problem: Nutrition: Goal: Adequate nutrition will be maintained Outcome: Progressing   Problem: Coping: Goal: Level of anxiety will decrease Outcome: Progressing   Problem: Elimination: Goal: Will not experience complications related to bowel motility Outcome: Progressing Goal: Will not experience complications related to urinary retention Outcome: Progressing   Problem: Safety: Goal: Ability to remain free from injury will improve Outcome: Progressing   Problem: Skin Integrity: Goal: Risk for impaired skin integrity will decrease Outcome: Progressing

## 2022-09-27 NOTE — Plan of Care (Signed)
  Problem: Education: Goal: Ability to describe self-care measures that may prevent or decrease complications (Diabetes Survival Skills Education) will improve Outcome: Progressing   Problem: Health Behavior/Discharge Planning: Goal: Ability to identify and utilize available resources and services will improve Outcome: Progressing   Problem: Metabolic: Goal: Ability to maintain appropriate glucose levels will improve Outcome: Progressing   Problem: Education: Goal: Knowledge of General Education information will improve Description: Including pain rating scale, medication(s)/side effects and non-pharmacologic comfort measures Outcome: Progressing

## 2022-09-28 DIAGNOSIS — E1142 Type 2 diabetes mellitus with diabetic polyneuropathy: Secondary | ICD-10-CM | POA: Diagnosis not present

## 2022-09-28 DIAGNOSIS — M79604 Pain in right leg: Secondary | ICD-10-CM | POA: Diagnosis not present

## 2022-09-28 DIAGNOSIS — G608 Other hereditary and idiopathic neuropathies: Secondary | ICD-10-CM | POA: Diagnosis not present

## 2022-09-28 DIAGNOSIS — E876 Hypokalemia: Secondary | ICD-10-CM | POA: Diagnosis not present

## 2022-09-28 LAB — GLUCOSE, CAPILLARY
Glucose-Capillary: 90 mg/dL (ref 70–99)
Glucose-Capillary: 138 mg/dL — ABNORMAL HIGH (ref 70–99)
Glucose-Capillary: 111 mg/dL — ABNORMAL HIGH (ref 70–99)
Glucose-Capillary: 113 mg/dL — ABNORMAL HIGH (ref 70–99)

## 2022-09-28 MED ORDER — LORATADINE 10 MG PO TABS
10.0000 mg | ORAL_TABLET | Freq: Every day | ORAL | Status: DC
  Administered 2022-09-28 – 2022-09-29 (×2): 10 mg via ORAL
  Filled 2022-09-28 (×2): qty 1

## 2022-09-28 NOTE — TOC Progression Note (Signed)
Transition of Care [facility]) - Progression Note    Patient Details  Name: James [name] MRN: [id number] Date of Birth: 03/20/1960  Transition of Care [facility]) CM/SW Contact  Marlowe Sax, RN Phone Number: 09/28/2022, 10:30 AM  Clinical Narrative:    Met with the patient to discuss DC plan, he stated prior to coming to the hospital he was independent with making  his meals, walking and caring for himself  He stated that now he will need Long term care and can not return home to care for himself,  He stated that he has not applied for long term medicaid, I reached out to Sherrilyn Rist and Shanda Bumps in the financial depart asking them to assist with applying for long term care Medicaid    I reached out to the offering facilities and they need him to have Medicaid in place before they can accept him for STR    Expected Discharge Plan and Services                                               Social Determinants of Health (SDOH) Interventions SDOH Screenings   Food Insecurity: No Food Insecurity (09/24/2022)  Housing: Low Risk  (09/24/2022)  Transportation Needs: No Transportation Needs (09/24/2022)  Utilities: Not At Risk (09/24/2022)  Alcohol Screen: Low Risk  (05/04/2022)  Depression (PHQ2-9): Low Risk  (05/25/2022)  Financial Resource Strain: Low Risk  (03/25/2022)  Physical Activity: Inactive (03/25/2022)  Social Connections: Socially Isolated (03/25/2022)  Stress: No Stress Concern Present (03/25/2022)  Tobacco Use: High Risk (09/24/2022)    Readmission Risk Interventions     No data to display

## 2022-09-28 NOTE — TOC Progression Note (Signed)
Transition of Care [facility]) - Progression Note    Patient Details  Name: James [name] MRN: [id number] Date of Birth: 1960-02-10  Transition of Care [facility]) CM/SW Contact  Marlowe Sax, RN Phone Number: 09/28/2022, 3:17 PM  Clinical Narrative:     The ins is approved to go to [facility] 9-11/9-13 [id number] Auth ref number [id number]  Expected Discharge Plan: Skilled Nursing Facility Barriers to Discharge: No SNF bed, Inadequate or no insurance (needs long term medicaid)  Expected Discharge Plan and Services                                               Social Determinants of Health (SDOH) Interventions SDOH Screenings   Food Insecurity: No Food Insecurity (09/24/2022)  Housing: Low Risk  (09/24/2022)  Transportation Needs: No Transportation Needs (09/24/2022)  Utilities: Not At Risk (09/24/2022)  Alcohol Screen: Low Risk  (05/04/2022)  Depression (PHQ2-9): Low Risk  (05/25/2022)  Financial Resource Strain: Low Risk  (03/25/2022)  Physical Activity: Inactive (03/25/2022)  Social Connections: Socially Isolated (03/25/2022)  Stress: No Stress Concern Present (03/25/2022)  Tobacco Use: High Risk (09/24/2022)    Readmission Risk Interventions     No data to display

## 2022-09-28 NOTE — Progress Notes (Signed)
Progress Note   Patient: James [name] [id number] DOB: 07-19-1960 DOA: 09/23/2022     4 DOS: the patient was seen and examined on 09/28/2022   Brief hospital course: Taken from H&P.  James [name] is a 62 y.o. male with medical history significant of multiple joint osteoarthritis, diabetes, hyperlipidemia, essential hypertension, multiple surgeries, tobacco abuse, and morbid obesity who presents to the ER with bilateral leg pain from hips down for 3 days. Patient unable to even get out of bed or to perform any function.    Hemodynamically stable but unable to stand on his feet in ER requiring multiple persons to help for transfer, being admitted for pain management and PT and OT evaluation.  9/7: Bilateral hip imaging with severe arthritis.  Starting on Norco and adding muscle relaxant.  Patient need to follow-up with orthopedic surgery as outpatient for definitive treatment, might not be a candidate for replacement based on weight. Hypokalemia and hypomagnesemia which are being repleted. PT and OT are recommending SNF.  9/8: Vital stable.  Hypomagnesemia and hypokalemia resolved.  9/9: Hemodynamically stable.  Awaiting disposition.  9/10: Still awaiting disposition.  Able to stand today.  9/11: Hemodynamically stable, should be able to go to [facility] tomorrow   Assessment and Plan: * Chronic lower extremity pain (1ry area of Pain) (Bilateral) Patient has an history of significant osteoarthritis and chronic lower extremity pain which has been increased to the point that he cannot walk anymore. Imaging negative for any acute fractures but did show severe osteoarthritis of bilateral hip. -Pain management -PT/OT evaluation-recommending rehab -Outpatient orthopedic evaluation, not sure whether he meets criteria for replacement because of weight  Polyneuropathy, peripheral sensorimotor axonal -Continue home Lyrica  Osteoarthritis of hips (Bilateral) Chronic osteoarthritis  of multiple joints. -Pain management -PT/OT  Chronic pain syndrome Patient is on chronic opioid therapy at home due to chronic pain involving multiple joints.  He was on oxycodone 10 mg at home and states that it is not working well now. -Starting on Norco 10 mg every 6 hourly as needed -Adding Flexeril  FTT (failure to thrive) in adult Multifactorial due to severe osteoarthritis which interfere with his mobility and he is lying down all the time. -Pain control  Hypokalemia Resolved. He was taking Lasix at home. -Continue to monitor.  Type 2 diabetes mellitus with morbid obesity (HCC) Seems uncontrolled with hyperglycemia and A1c was 12.9 50-month ago Patient was on Ozempic and metformin at home -Continue with SSI -Check A1c  Essential hypertension Blood pressure within lower goal. -Currently holding home losartan, HCTZ and Lasix -Can resume home medications if blood pressure started trending up  Hyperlipidemia associated with type 2 diabetes mellitus (HCC) -Current continue statin  Class 3 obesity with alveolar hypoventilation, serious comorbidity, and body mass index (BMI) of 45.0 to 49.9 in adult [facility]) Estimated body mass index is 46.05 kg/m as calculated from the following:   Height as of this encounter: 6' (1.829 m).   Weight as of this encounter: 154 kg.   -This will complicate overall prognosis -Patient should encourage for weight loss   Subjective: Patient was seen and examined today.  No new concern  Physical Exam: Vitals:   09/27/22 1749 09/28/22 0011 09/28/22 0843 09/28/22 1509  BP: 132/89 131/88 124/79 136/70  Pulse: 74 74 71 65  Resp: 17 18 18 16   Temp: 98.3 F (36.8 C) 98.1 F (36.7 C) 97.7 F (36.5 C) 98.1 F (36.7 C)  TempSrc:  Oral    SpO2: 97% 98% 90%  92%  Weight:      Height:       General.  Morbidly obese gentleman, in no acute distress. Pulmonary.  Lungs clear bilaterally, normal respiratory effort. CV.  Regular rate and rhythm, no  JVD, rub or murmur. Abdomen.  Soft, nontender, nondistended, BS positive. CNS.  Alert and oriented .  No focal neurologic deficit. Extremities.  No edema, no cyanosis, pulses intact and symmetrical. Psychiatry.  Judgment and insight appears normal.    Data Reviewed: Prior data reviewed  Family Communication: Discussed with patient  Disposition: Status is: Inpatient Remains inpatient appropriate because: Need rehab placement  Planned Discharge Destination: Skilled nursing facility  Time spent: 39 minutes  This record has been created using Conservation officer, historic buildings. Errors have been sought and corrected,but may not always be located. Such creation errors do not reflect on the standard of care.   Author: Arnetha Courser, MD 09/28/2022 3:20 PM  For on call review [email or web address].

## 2022-09-28 NOTE — Care Management Important Message (Signed)
Important Message  Patient Details  Name: [name] MRN: [id number] Date of Birth: 1960/05/23   Medicare Important Message Given:  N/A - LOS <3 / Initial given by admissions     Olegario Messier A Runette Scifres 09/28/2022, 10:45 AM

## 2022-09-28 NOTE — TOC Progression Note (Signed)
Transition of Care [facility]) - Progression Note    Patient Details  Name: James [name] MRN: [id number] Date of Birth: Oct 08, 1960  Transition of Care [facility]) CM/SW Contact  Marlowe Sax, RN Phone Number: 09/28/2022, 10:58 AM  Clinical Narrative:     Met with the patient and reviewed that we have 1 place that can accept him for STR and that can help him get the long term medicaid, [place], he accepted the bed offer I notified Olegario Messier with [employer]  Expected Discharge Plan: Skilled Nursing Facility Barriers to Discharge: No SNF bed, Inadequate or no insurance (needs long term medicaid)  Expected Discharge Plan and Services                                               Social Determinants of Health (SDOH) Interventions SDOH Screenings   Food Insecurity: No Food Insecurity (09/24/2022)  Housing: Low Risk  (09/24/2022)  Transportation Needs: No Transportation Needs (09/24/2022)  Utilities: Not At Risk (09/24/2022)  Alcohol Screen: Low Risk  (05/04/2022)  Depression (PHQ2-9): Low Risk  (05/25/2022)  Financial Resource Strain: Low Risk  (03/25/2022)  Physical Activity: Inactive (03/25/2022)  Social Connections: Socially Isolated (03/25/2022)  Stress: No Stress Concern Present (03/25/2022)  Tobacco Use: High Risk (09/24/2022)    Readmission Risk Interventions     No data to display

## 2022-09-28 NOTE — Progress Notes (Signed)
Occupational Therapy Treatment Patient Details Name: James [name] MRN: [id number] DOB: 04-19-1960 Today's Date: 09/28/2022   History of present illness Patient is a 62 year old male with failure to thrive secondary to severe osteoarthritis with pain. History significant of multiple joint osteoarthritis, diabetes, hyperlipidemia, essential hypertension, multiple surgeries, tobacco abuse   OT comments  Pt seen for OT tx. Pt endorsing 10/10 RLE>LLE pain and declines EOB/OOB attempts 2/2 pain. Pt agreeable to bed level session focused on bathing. Pt set up for UB bathing and peri hygiene which he was able to complete requiring MIN  A for washing his upper back. Pt briefly able to use bed rails to pull himself into sitting for OT to assist with washing his back but unable to maintain without max BUE support on bed rails. Pt declined additional washing of backside and BLE 2/2 fatigue and pain. Continues to benefit from skilled OT services this date.       If plan is discharge home, recommend the following:  Two people to help with walking and/or transfers;Two people to help with bathing/dressing/bathroom;Assistance with cooking/housework;Assist for transportation;Help with stairs or ramp for entrance   Equipment Recommendations  None recommended by OT    Recommendations for Other Services      Precautions / Restrictions Precautions Precautions: Fall Restrictions Weight Bearing Restrictions: No       Mobility Bed Mobility Overal bed mobility: Needs Assistance             General bed mobility comments: Pt able to use bed rails to briefly pull himself in unsupported sitting for OT to assist in washing his back, unable to maintain for <30 sec; pt declined EOB 2/2 pain    Transfers                         Balance                                           ADL either performed or assessed with clinical judgement   ADL Overall ADL's : Needs  assistance/impaired     Grooming: Bed level;Set up;Wash/dry face;Wash/dry hands;Applying deodorant   Upper Body Bathing: Bed level;Minimal assistance Upper Body Bathing Details (indicate cue type and reason): Pt setup for bed level UB bathing requiring MIN A to wash upper back   Lower Body Bathing Details (indicate cue type and reason): pt set up for perineal cleansing, declined additional cleansing of LB                            Extremity/Trunk Assessment              Vision       Perception     Praxis      Cognition Arousal: Alert Behavior During Therapy: WFL for tasks assessed/performed Overall Cognitive Status: Within Functional Limits for tasks assessed                                          Exercises      Shoulder Instructions       General Comments      Pertinent Vitals/ Pain       Pain Assessment Pain Assessment: 0-10 Pain Score: 10-Worst  pain ever Pain Location: bilateral legs mostly right >left Pain Descriptors / Indicators: Discomfort, Aching Pain Intervention(s): Limited activity within patient's tolerance, Monitored during session, Repositioned, Patient requesting pain meds-RN notified, RN gave pain meds during session  Home Living                                          Prior Functioning/Environment              Frequency  Min 1X/week        Progress Toward Goals  OT Goals(current goals can now be found in the care plan section)  Progress towards OT goals: Progressing toward goals  Acute Rehab OT Goals Patient Stated Goal: go to rehab and get back to his normal OT Goal Formulation: With patient Time For Goal Achievement: 10/08/22 Potential to Achieve Goals: Good  Plan      Co-evaluation                 AM-PAC OT "6 Clicks" Daily Activity     Outcome Measure   Help from another person eating meals?: None Help from another person taking care of personal grooming?:  None Help from another person toileting, which includes using toliet, bedpan, or urinal?: Total Help from another person bathing (including washing, rinsing, drying)?: A Lot Help from another person to put on and taking off regular upper body clothing?: A Lot Help from another person to put on and taking off regular lower body clothing?: Total 6 Click Score: 14    End of Session    OT Visit Diagnosis: Muscle weakness (generalized) (M62.81);Pain;Unsteadiness on feet (R26.81) Pain - Right/Left: Right Pain - part of body: Shoulder;Hip;Knee;Leg;Ankle and joints of foot   Activity Tolerance Patient limited by pain   Patient Left in bed;with call bell/phone within reach;with bed alarm set   Nurse Communication Patient requests pain meds        Time: 1610-9604 OT Time Calculation (min): 17 min  Charges: OT General Charges $OT Visit: 1 Visit OT Treatments $Self Care/Home Management : 8-22 mins  Arman Filter., MPH, MS, OTR/L ascom [phone] 09/28/22, 4:29 PM

## 2022-09-28 NOTE — Plan of Care (Signed)
  Problem: Health Behavior/Discharge Planning: Goal: Ability to identify and utilize available resources and services will improve Outcome: Progressing Goal: Ability to manage health-related needs will improve Outcome: Progressing   Problem: Metabolic: Goal: Ability to maintain appropriate glucose levels will improve Outcome: Progressing   Problem: Nutritional: Goal: Maintenance of adequate nutrition will improve Outcome: Progressing Goal: Progress toward achieving an optimal weight will improve Outcome: Progressing   Problem: Education: Goal: Knowledge of General Education information will improve Description: Including pain rating scale, medication(s)/side effects and non-pharmacologic comfort measures Outcome: Progressing   Problem: Health Behavior/Discharge Planning: Goal: Ability to manage health-related needs will improve Outcome: Progressing   Problem: Pain Managment: Goal: General experience of comfort will improve Outcome: Progressing

## 2022-09-29 DIAGNOSIS — M25512 Pain in left shoulder: Secondary | ICD-10-CM

## 2022-09-29 DIAGNOSIS — M79604 Pain in right leg: Secondary | ICD-10-CM | POA: Diagnosis not present

## 2022-09-29 DIAGNOSIS — Z789 Other specified health status: Secondary | ICD-10-CM

## 2022-09-29 DIAGNOSIS — M5442 Lumbago with sciatica, left side: Secondary | ICD-10-CM | POA: Diagnosis not present

## 2022-09-29 DIAGNOSIS — M5136 Other intervertebral disc degeneration, lumbar region: Secondary | ICD-10-CM

## 2022-09-29 DIAGNOSIS — R262 Difficulty in walking, not elsewhere classified: Secondary | ICD-10-CM

## 2022-09-29 DIAGNOSIS — M5441 Lumbago with sciatica, right side: Secondary | ICD-10-CM

## 2022-09-29 DIAGNOSIS — M25561 Pain in right knee: Secondary | ICD-10-CM | POA: Diagnosis not present

## 2022-09-29 DIAGNOSIS — Z79891 Long term (current) use of opiate analgesic: Secondary | ICD-10-CM

## 2022-09-29 DIAGNOSIS — M25562 Pain in left knee: Secondary | ICD-10-CM

## 2022-09-29 LAB — GLUCOSE, CAPILLARY
Glucose-Capillary: 143 mg/dL — ABNORMAL HIGH (ref 70–99)
Glucose-Capillary: 130 mg/dL — ABNORMAL HIGH (ref 70–99)

## 2022-09-29 MED ORDER — OXYCODONE HCL 10 MG PO TABS: 10.0000 mg | ORAL_TABLET | Freq: Three times a day (TID) | ORAL | 0 refills | Status: DC | PRN

## 2022-09-29 MED ORDER — LACTULOSE 10 GM/15ML PO SOLN
30.0000 g | Freq: Every day | ORAL | Status: DC | PRN
  Administered 2022-09-29: 30 g via ORAL
  Filled 2022-09-29: qty 60

## 2022-09-29 MED ORDER — POLYETHYLENE GLYCOL 3350 17 G PO PACK: 17.0000 g | PACK | Freq: Two times a day (BID) | ORAL | Status: AC

## 2022-09-29 MED ORDER — LORATADINE 10 MG PO TABS: 10.0000 mg | ORAL_TABLET | Freq: Every day | ORAL | Status: DC

## 2022-09-29 MED ORDER — PREGABALIN 200 MG PO CAPS: 200.0000 mg | ORAL_CAPSULE | Freq: Two times a day (BID) | ORAL | 0 refills | Status: AC

## 2022-09-29 MED ORDER — LACTULOSE 10 GM/15ML PO SOLN: 30.0000 g | Freq: Every day | ORAL | Status: AC | PRN

## 2022-09-29 MED ORDER — INFLUENZA VIRUS VACC SPLIT PF (FLUZONE) 0.5 ML IM SUSY
0.5000 mL | PREFILLED_SYRINGE | Freq: Once | INTRAMUSCULAR | Status: AC
  Administered 2022-09-29: 0.5 mL via INTRAMUSCULAR
  Filled 2022-09-29: qty 0.5

## 2022-09-29 MED ORDER — CYCLOBENZAPRINE HCL 10 MG PO TABS: 10.0000 mg | ORAL_TABLET | Freq: Three times a day (TID) | ORAL | Status: AC | PRN

## 2022-09-29 NOTE — Discharge Summary (Signed)
Physician Discharge Summary   Patient: James [name] MRN: [id number] DOB: February 08, 1960  Admit date:     09/23/2022  Discharge date: 09/29/22  Discharge Physician: Arnetha Courser   PCP: Smitty Cords, DO   Recommendations at discharge:  Please obtain CBC and BMP in 1 week Please encourage ambulation and weight loss Follow-up with orthopedic surgery for his severe arthritis as he will need joint replacement. Follow-up with primary care provider  Discharge Diagnoses: Principal Problem:   Chronic lower extremity pain (1ry area of Pain) (Bilateral) Active Problems:   Polyneuropathy, peripheral sensorimotor axonal   Diabetic sensorimotor polyneuropathy (HCC)   Osteoarthritis of hips (Bilateral)   Chronic pain syndrome   FTT (failure to thrive) in adult   Type 2 diabetes mellitus with morbid obesity (HCC)   Hypokalemia   Essential hypertension   Hyperlipidemia associated with type 2 diabetes mellitus (HCC)   Class 3 obesity with alveolar hypoventilation, serious comorbidity, and body mass index (BMI) of 45.0 to 49.9 in adult [facility])   Unable to care for self   Unable to ambulate   Hospital Course: Taken from H&P.  James [name] is a 62 y.o. male with medical history significant of multiple joint osteoarthritis, diabetes, hyperlipidemia, essential hypertension, multiple surgeries, tobacco abuse, and morbid obesity who presents to the ER with bilateral leg pain from hips down for 3 days. Patient unable to even get out of bed or to perform any function.    Hemodynamically stable but unable to stand on his feet in ER requiring multiple persons to help for transfer, being admitted for pain management and PT and OT evaluation.  9/7: Bilateral hip imaging with severe arthritis.  Starting on Norco and adding muscle relaxant.  Patient need to follow-up with orthopedic surgery as outpatient for definitive treatment, might not be a candidate for replacement based on  weight. Hypokalemia and hypomagnesemia which are being repleted. PT and OT are recommending SNF.  9/8: Vital stable.  Hypomagnesemia and hypokalemia resolved.  9/9: Hemodynamically stable.  Awaiting disposition.  9/10: Still awaiting disposition.  Able to stand today.  9/11: Hemodynamically stable, should be able to go to [facility] tomorrow  9/12: Patient remained hemodynamically stable.  Patient is being discharged to rehab for further management.  Patient need outpatient orthopedic evaluation for her severe arthritis. He need to encourage weight loss to become an candidate for joint replacement treatment to improve the quality of his life. Significant pain with ambulation.  Patient experienced some itching with hydrocodone, with being discharged with oxycodone 10 mg to be used every 8 hourly as needed as he was doing it before and will continue home Lyrica to help.  He can also use as needed Flexeril.  Please avoid constipation with narcotic use.  He was given lactulose and MiraLAX to be used as needed.  He will continue with rest of his home medications and need to have a close follow-up with his providers for further recommendations.  Assessment and Plan: * Chronic lower extremity pain (1ry area of Pain) (Bilateral) Patient has an history of significant osteoarthritis and chronic lower extremity pain which has been increased to the point that he cannot walk anymore. Imaging negative for any acute fractures but did show severe osteoarthritis of bilateral hip. -Pain management -PT/OT evaluation-recommending rehab -Outpatient orthopedic evaluation, not sure whether he meets criteria for replacement because of weight  Polyneuropathy, peripheral sensorimotor axonal -Continue home Lyrica  Osteoarthritis of hips (Bilateral) Chronic osteoarthritis of multiple joints. -Pain management -PT/OT  Chronic pain  syndrome Patient is on chronic opioid therapy at home due to chronic pain  involving multiple joints.  He was on oxycodone 10 mg at home and states that it is not working well now. -Patient developed itchiness with hydrocodone so switching to prior home dose of Oxy. -Adding Flexeril  FTT (failure to thrive) in adult Multifactorial due to severe osteoarthritis which interfere with his mobility and he is lying down all the time. -Pain control  Hypokalemia Resolved. He was taking Lasix at home. -Continue to monitor.  Type 2 diabetes mellitus with morbid obesity (HCC) Seems uncontrolled with hyperglycemia and A1c was 12.9 87-month ago Patient was on Ozempic and metformin at home -Continue with SSI -Check A1c  Essential hypertension Blood pressure within lower goal. -Currently holding home losartan, HCTZ and Lasix -Can resume home medications if blood pressure started trending up  Hyperlipidemia associated with type 2 diabetes mellitus (HCC) -Current continue statin  Class 3 obesity with alveolar hypoventilation, serious comorbidity, and body mass index (BMI) of 45.0 to 49.9 in adult [facility]) Estimated body mass index is 46.05 kg/m as calculated from the following:   Height as of this encounter: 6' (1.829 m).   Weight as of this encounter: 154 kg.   -This will complicate overall prognosis -Patient should encourage for weight loss   Pain control -  Controlled Substance Reporting System database was reviewed. and patient was instructed, not to drive, operate heavy machinery, perform activities at heights, swimming or participation in water activities or provide baby-sitting services while on Pain, Sleep and Anxiety Medications; until their outpatient Physician has advised to do so again. Also recommended to not to take more than prescribed Pain, Sleep and Anxiety Medications.  Consultants: None Procedures performed: None Disposition: Skilled nursing facility Diet recommendation:  Discharge Diet Orders (From admission, onward)     Start      Ordered   09/29/22 0000  Diet - low sodium heart healthy        09/29/22 1003           Cardiac and Carb modified diet DISCHARGE MEDICATION: Allergies as of 09/29/2022   No Known Allergies      Medication List     STOP taking these medications    hydrochlorothiazide 25 MG tablet Commonly known as: HYDRODIURIL   ibuprofen 800 MG tablet Commonly known as: ADVIL       TAKE these medications    albuterol 108 (90 Base) MCG/ACT inhaler Commonly known as: VENTOLIN HFA INHALE 1 TO 2 PUFFS BY MOUTH EVERY 6 HOURS AS NEEDED FOR WHEEZING FOR SHORTNESS OF BREATH   atorvastatin 40 MG tablet Commonly known as: LIPITOR Take 1 tablet by mouth once daily   Chantix 1 MG tablet Generic drug: varenicline Take 1 mg by mouth 2 (two) times daily.   cyclobenzaprine 10 MG tablet Commonly known as: FLEXERIL Take 1 tablet (10 mg total) by mouth 3 (three) times daily as needed for muscle spasms.   fluticasone-salmeterol 250-50 MCG/ACT Aepb Commonly known as: Advair Diskus Inhale 1 puff into the lungs in the morning and at bedtime.   furosemide 20 MG tablet Commonly known as: LASIX TAKE 1 TABLET BY MOUTH ONCE DAILY AS NEEDED FOR FLUID OR EDEMA   lactulose 10 GM/15ML solution Commonly known as: CHRONULAC Take 45 mLs (30 g total) by mouth daily as needed for mild constipation.   loratadine 10 MG tablet Commonly known as: CLARITIN Take 1 tablet (10 mg total) by mouth daily.   losartan 25  MG tablet Commonly known as: COZAAR Take 1 tablet by mouth once daily   metFORMIN 1000 MG tablet Commonly known as: GLUCOPHAGE TAKE 1 TABLET BY MOUTH WITH BREAKFAST   naloxone 4 MG/0.1ML Liqd nasal spray kit Commonly known as: NARCAN Place 1 spray into the nose as needed for up to 365 doses (for opioid-induced respiratory depresssion). In case of emergency (overdose), spray once into each nostril. If no response within 3 minutes, repeat application and call 911.   OneTouch Delica Lancets 33G  Misc Use to check blood sugar 1 x daily   OneTouch Verio test strip Generic drug: glucose blood Use to check blood sugar 1 x daily   OneTouch Verio w/Device Kit Use to check blood sugar 1 x daily   Oxycodone HCl 10 MG Tabs Take 1 tablet (10 mg total) by mouth every 8 (eight) hours as needed. Must last 30 days. Start taking on: October 28, 2022 What changed:  when to take this reasons to take this These instructions start on October 28, 2022. If you are unsure what to do until then, ask your doctor or other care provider. Another medication with the same name was removed. Continue taking this medication, and follow the directions you see here.   Ozempic (1 MG/DOSE) 4 MG/3ML Sopn Generic drug: Semaglutide (1 MG/DOSE) INJECT 1MG  INTO THE SKIN ONCE A WEEK   Pen Needles 3/16" 31G X 5 MM Misc 1 each by Does not apply route 4 (four) times daily -  before meals and at bedtime.   polyethylene glycol 17 g packet Commonly known as: MIRALAX / GLYCOLAX Take 17 g by mouth 2 (two) times daily.   pregabalin 200 MG capsule Commonly known as: LYRICA Take 1 capsule (200 mg total) by mouth 2 (two) times daily. What changed:  medication strength See the new instructions.        Contact information for follow-up providers     Smitty Cords, DO. Schedule an appointment as soon as possible for a visit in 1 week(s).   Specialty: Family Medicine Contact information: [address] [place] [address] [phone]              Contact information for after-discharge care     Destination     [facility] Preferred SNF .   Service: Skilled Nursing Contact information: [address] [place] [address] [phone]                    Discharge Exam: Ceasar Mons Weights   09/23/22 1424  Weight: (!) 154 kg   General.  Morbidly obese gentleman, in no acute distress. Pulmonary.  Lungs clear bilaterally, normal  respiratory effort. CV.  Regular rate and rhythm, no JVD, rub or murmur. Abdomen.  Soft, nontender, nondistended, BS positive. CNS.  Alert and oriented .  No focal neurologic deficit. Extremities.  No edema, no cyanosis, pulses intact and symmetrical. Psychiatry.  Judgment and insight appears normal.   Condition at discharge: stable  The results of significant diagnostics from this hospitalization (including imaging, microbiology, ancillary and laboratory) are listed below for reference.   Imaging Studies: DG HIPS BILAT WITH PELVIS MIN 5 VIEWS  Result Date: 09/23/2022 CLINICAL DATA:  Bilateral leg pain EXAM: DG HIP (WITH OR WITHOUT PELVIS) 5+V BILAT COMPARISON:  MRI 04/20/2021, radiograph 02/23/2021 FINDINGS: SI joints are non widened. pubic symphysis and rami appear intact. Left hip: Severe arthritis of the left hip with superior bone on bone appearance and subarticular  sclerosis. Bulky spurring inferiorly. Right hip: IMPRESSION: Severe right greater than left hip arthritis without acute osseous abnormality Electronically Signed   By: Jasmine Pang M.D.   On: 09/23/2022 18:11    Microbiology: No results found for this or any previous visit.  Labs: CBC: Recent Labs  Lab 09/23/22 1425 09/24/22 0609  WBC 13.5* 8.8  HGB 15.4 14.2  HCT 46.7 43.8  MCV 88.1 89.8  PLT 386 336   Basic Metabolic Panel: Recent Labs  Lab 09/23/22 1425 09/24/22 0609 09/24/22 0909 09/25/22 0513  NA 136 141  --  139  K 3.1* 3.0*  --  3.7  CL 94* 102  --  101  CO2 27 30  --  31  GLUCOSE 106* 113*  --  122*  BUN 9 8  --  9  CREATININE 0.58* 0.55*  --  0.57*  CALCIUM 9.3 8.4*  --  8.4*  MG  --   --  1.6* 2.2  PHOS  --   --   --  3.5   Liver Function Tests: Recent Labs  Lab 09/23/22 1425 09/24/22 0609  AST 13* 9*  ALT 6 6  ALKPHOS 103 82  BILITOT 0.9 0.8  PROT 7.4 5.9*  ALBUMIN 3.8 3.2*   CBG: Recent Labs  Lab 09/28/22 0845 09/28/22 1228 09/28/22 1729 09/28/22 2142 09/29/22 0818   GLUCAP 90 138* 111* 113* 143*    Discharge time spent: greater than 30 minutes.  This record has been created using Conservation officer, historic buildings. Errors have been sought and corrected,but may not always be located. Such creation errors do not reflect on the standard of care.   Signed: Arnetha Courser, MD [facility] 09/29/2022

## 2022-09-29 NOTE — Care Management Important Message (Signed)
Important Message  Patient Details  Name: James [name] MRN: [id number] Date of Birth: 13-Aug-1960   Medicare Important Message Given:  N/A - LOS <3 / Initial given by admissions     [name] 09/29/2022, 9:39 AM

## 2022-09-29 NOTE — TOC Progression Note (Signed)
Transition of Care [facility]) - Progression Note    Patient Details  Name: James [name] MRN: [id number] Date of Birth: [birth year]  Transition of Care [facility]) CM/SW Contact  Marlowe Sax, RN Phone Number: 09/29/2022, 1:44 PM  Clinical Narrative:     Going to room 3724 at [facility], EMS called to transport, there is one ahead of him on their list, he will notify his friends and family  Expected Discharge Plan: Skilled Nursing Facility Barriers to Discharge: No SNF bed, Inadequate or no insurance (needs long term medicaid)  Expected Discharge Plan and Services         Expected Discharge Date: 09/29/22                                     Social Determinants of Health (SDOH) Interventions SDOH Screenings   Food Insecurity: No Food Insecurity (09/24/2022)  Housing: Low Risk  (09/24/2022)  Transportation Needs: No Transportation Needs (09/24/2022)  Utilities: Not At Risk (09/24/2022)  Alcohol Screen: Low Risk  (05/04/2022)  Depression (PHQ2-9): Low Risk  (05/25/2022)  Financial Resource Strain: Low Risk  (03/25/2022)  Physical Activity: Inactive (03/25/2022)  Social Connections: Socially Isolated (03/25/2022)  Stress: No Stress Concern Present (03/25/2022)  Tobacco Use: High Risk (09/24/2022)    Readmission Risk Interventions     No data to display

## 2022-10-05 ENCOUNTER — Encounter: Payer: Self-pay | Admitting: Pharmacist

## 2022-10-12 ENCOUNTER — Inpatient Hospital Stay: Payer: 59 | Admitting: Family Medicine

## 2022-11-07 ENCOUNTER — Ambulatory Visit: Payer: 59 | Admitting: Orthopaedic Surgery

## 2022-11-14 ENCOUNTER — Encounter: Payer: Self-pay | Admitting: Physician Assistant

## 2022-11-14 ENCOUNTER — Encounter: Payer: Self-pay | Admitting: Sports Medicine

## 2022-11-14 ENCOUNTER — Ambulatory Visit (INDEPENDENT_AMBULATORY_CARE_PROVIDER_SITE_OTHER): Payer: 59 | Admitting: Physician Assistant

## 2022-11-14 ENCOUNTER — Other Ambulatory Visit: Payer: Self-pay

## 2022-11-14 ENCOUNTER — Ambulatory Visit (INDEPENDENT_AMBULATORY_CARE_PROVIDER_SITE_OTHER): Payer: 59

## 2022-11-14 ENCOUNTER — Ambulatory Visit (INDEPENDENT_AMBULATORY_CARE_PROVIDER_SITE_OTHER): Payer: 59 | Admitting: Sports Medicine

## 2022-11-14 DIAGNOSIS — M160 Bilateral primary osteoarthritis of hip: Secondary | ICD-10-CM

## 2022-11-14 DIAGNOSIS — M25551 Pain in right hip: Secondary | ICD-10-CM

## 2022-11-14 DIAGNOSIS — G8929 Other chronic pain: Secondary | ICD-10-CM

## 2022-11-14 MED ORDER — LIDOCAINE HCL 1 % IJ SOLN
4.0000 mL | INTRAMUSCULAR | Status: AC | PRN
Start: 2022-11-14 — End: 2022-11-14
  Administered 2022-11-14: 4 mL

## 2022-11-14 MED ORDER — METHYLPREDNISOLONE ACETATE 40 MG/ML IJ SUSP
40.0000 mg | INTRAMUSCULAR | Status: AC | PRN
Start: 2022-11-14 — End: 2022-11-14
  Administered 2022-11-14: 40 mg via INTRA_ARTICULAR

## 2022-11-14 NOTE — Progress Notes (Addendum)
Procedure Note  Patient: James [name]             Date of Birth: 07/13/1960           MRN: [id number]             Visit Date: 11/14/2022  Procedures: Visit Diagnoses:  1. Primary osteoarthritis of both hips   2. Chronic right hip pain    Large Joint Inj: R hip joint on 11/14/2022 10:46 AM Indications: pain Details: 22 G Needle length (in): 5" needle, ultrasound-guided anterior approach Medications: 4 mL lidocaine 1 %; 40 mg methylPREDNISolone acetate 40 MG/ML Outcome: tolerated well, no immediate complications  Procedure: US-guided intra-articular hip injection, right After discussion on risks/benefits/indications and informed verbal consent was obtained, a timeout was performed. Patient was lying supine on exam table. The hip was cleaned with betadine and alcohol swabs. Then utilizing ultrasound guidance, the patient's femoral head and neck junction was identified and subsequently injected with 4:1 lidocaine:depomedrol via an in-plane approach with ultrasound visualization of the injectate administered into the hip joint. Patient tolerated procedure well without immediate complications.   Procedure, treatment alternatives, risks and benefits explained, specific risks discussed. Consent was given by the patient. Immediately prior to procedure a time out was called to verify the correct patient, procedure, equipment, support staff and site/side marked as required. Patient was prepped and draped in the usual sterile fashion.     - tolerated procedure well without immediate AE's - follow-up with Rexene Edison as indicated; I am happy to see them as needed  Madelyn Brunner, DO Primary Care Sports Medicine Physician  [facility] - Orthopedics  This note was dictated using Dragon naturally speaking software and may contain errors in syntax, spelling, or content which have not been identified prior to signing this note.

## 2022-11-14 NOTE — Progress Notes (Signed)
Office Visit Note   Patient: James [name]           Date of Birth: [birth year]           MRN: [id number] Visit Date: 11/14/2022              Requested by: Smitty Cords, DO [address] [place],  [place] [address] PCP: Smitty Cords, DO   Assessment & Plan: Visit Diagnoses:  1. Osteoarthritis of hips (Bilateral)     Plan: Recommend continued physical therapy for range of motion bilateral hips.  Also would work on gait balance.  Will send him for a right hip intra-articular injection with the hopes that this will allow him to be able to participate more with physical therapy.  Given his obesity he is not a surgical candidate at this point in time.  Follow-Up Instructions: Return in about 3 months (around 02/14/2023).   Orders:  Orders Placed This Encounter  Procedures   XR HIPS BILAT W OR W/O PELVIS 3-4 VIEWS   No orders of the defined types were placed in this encounter.     Procedures: No procedures performed   Clinical Data: No additional findings.   Subjective: Chief Complaint  Patient presents with   Right Hip - Pain   Left Hip - Pain    HPI [name] 62 year old male comes in today due to bilateral hip pain.  Patient states that his right hip is most painful.  He has constant pain bilateral hips.  He is only able to stand for transfers.  He states that in September that he was able to walk short distances with a walker.  Medical history significant for diabetes, hyperlipidemia, hypertension tobacco abuse and morbid obesity.  He is currently taking oxycodone without any real relief.  He does report that before moving to this area he was getting injections in both hips this was in [place].  Review of Systems See HPI otherwise negative  Objective: Vital Signs: There were no vitals taken for this visit.  Physical Exam Constitutional:      Appearance: He is obese. He is not ill-appearing or diaphoretic.  Pulmonary:     Effort:  Pulmonary effort is normal.  Neurological:     Mental Status: He is alert.  Psychiatric:        Behavior: Behavior normal.     Ortho Exam Bilateral hips pain with logrolling internal/external rotation.  Any attempts of rotation of either leg causes significant pain right greater than left.  Calfs are supple and nontender bilaterally.  Patient presents a little stretcher today.  Specialty Comments:  No specialty comments available.  Imaging: XR HIPS BILAT W OR W/O PELVIS 3-4 VIEWS  Result Date: 11/14/2022 AP pelvis and lateral views of both hips: No acute fracture.  Both hips well located.  Severe end-stage arthritis of the right hip with cystic changes within the femoral head.  Left hip with bone-on-bone arthritic changes with cystic changes within the femoral head.  No evidence of AVN or acute fracture.    PMFS History: Patient Active Problem List   Diagnosis Date Noted   Unable to care for self 09/29/2022   Unable to ambulate 09/29/2022   FTT (failure to thrive) in adult 09/23/2022   Hypokalemia 09/23/2022   Cellulitis 06/18/2022   Candidal intertrigo 06/18/2022   Hyperosmolar hyperglycemic state (HHS) (HCC) 06/17/2022   Chronic, continuous use of opioids 06/17/2022   Hyponatremia 06/17/2022   Hypercapnia 06/17/2022   Generalized weakness  06/17/2022   Asthma 03/01/2022   Dyspnea on exertion 03/01/2022   Essential hypertension 03/01/2022   Hypercholesterolemia 03/01/2022   Mild persistent asthma without complication 02/15/2022   Class 3 severe obesity with serious comorbidity and body mass index (BMI) of 50.0 to 59.9 in adult [facility]) 02/15/2022   Chronic thigh pain (Right) 12/14/2021    Class: Chronic   Class 3 obesity with alveolar hypoventilation, serious comorbidity, and body mass index (BMI) of 45.0 to 49.9 in adult (HCC) 11/04/2021   Epidural lipomatosis (L1-2 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) 11/03/2021   Lumbar facet arthropathy (Multilevel) (L1-2 through L5-S1) (Bilateral) 11/03/2021    Lumbar central spinal stenosis, 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 11/03/2021   Lumbar foraminal stenosis (Bilateral: L3-4, L4-5) (Left: L2-3) 11/03/2021   Lumbar lateral recess stenosis (Right: L3-4) (Bilateral: L4-5) 11/03/2021   Lumbar nerve root impingement (Right: L4) 11/03/2021   Herniated nucleus pulposus, L3-4 (Right) 11/03/2021   Hyperlipidemia associated with type 2 diabetes mellitus (HCC) 06/25/2021   Abnormal MRI, lumbar spine (05/21/2021) 05/24/2021   Chronic arthropathy 05/24/2021   Pes planus 05/24/2021   Osteoarthritis of AC (acromioclavicular) joint (Left) 04/29/2021   Osteoarthritis of shoulder (Left) 04/29/2021   Rotator cuff arthropathy of shoulder (Left) 04/29/2021   Lumbosacral facet hypertrophy (L4-5, L5-S1) 04/29/2021   Lumbar facet syndrome 04/29/2021   Abnormal MRI, hip (04/21/2021) 04/29/2021   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) 04/29/2021   Abnormal EMG (electromyogram) (03/16/2021) 04/29/2021   Polyneuropathy, 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   Diabetic sensorimotor polyneuropathy (HCC) 04/29/2021   Diabetic neuropathy (HCC) 04/26/2021   Osteoarthritis of hips (Bilateral) 03/29/2021   Osteoarthritis of hip (Left) 03/29/2021   Osteoarthritis of hip (Right) 03/29/2021   Osteoarthritis of knee (Left) 03/29/2021   Elevated sed rate 03/29/2021   Vitamin D deficiency 03/29/2021   Elevated C-reactive protein (CRP) 03/29/2021   Lumbosacral radiculopathy at L5 (Left) 03/29/2021   Fall (03/22/2021) 03/29/2021   Pharmacologic therapy 02/15/2021   Disorder of skeletal system 02/15/2021   Problems influencing health status 02/15/2021   Chronic use of opiate for therapeutic purpose 02/15/2021   Type 2 diabetes mellitus with morbid obesity (HCC) 02/15/2021   Chronic lower extremity pain (1ry area of Pain) (Bilateral) 02/15/2021   Chronic low back pain (4th area of Pain) (Bilateral) w/ sciatica (Bilateral) 02/15/2021   Chronic shoulder pain (5th area of  Pain) (Left) 02/15/2021   Chronic hip pain (2ry area of Pain) (Bilateral) 02/15/2021   Chronic knee pain (3ry area of Pain) (Bilateral) 02/15/2021   Weakness of lower extremities (Bilateral) 01/15/2021   Recurrent falls 01/15/2021   Chronic pain syndrome 11/09/2020   DDD (degenerative disc disease), lumbar 11/09/2020   Primary osteoarthritis involving multiple joints 11/09/2020   Diabetes mellitus (HCC) 11/09/2020   Past Medical History:  Diagnosis Date   Arthritis    Diabetes mellitus without complication (HCC)    Hyperlipidemia    Hypertension     Family History  Problem Relation Age of Onset   Heart disease Mother    Arthritis Mother    Arthritis Father     Past Surgical History:  Procedure Laterality Date   APPENDECTOMY     COLONOSCOPY WITH PROPOFOL     COLONOSCOPY WITH PROPOFOL N/A 04/23/2021   Procedure: COLONOSCOPY WITH PROPOFOL;  Surgeon: Wyline Mood, MD;  [facility] ENDOSCOPY;  Service: Gastroenterology;  Laterality: N/A;   Social History   Occupational History   Not on file  Tobacco Use   Smoking status: Some Days    Current packs/day: 0.10  Average packs/day: 0.1 packs/day for 25.0 years (2.5 ttl pk-yrs)    Types: Cigarettes   Smokeless tobacco: Never  Vaping Use   Vaping status: Never Used  Substance and Sexual Activity   Alcohol use: Never   Drug use: Never   Sexual activity: Not on file

## 2022-11-14 NOTE — Progress Notes (Unsigned)
PROVIDER NOTE: Information contained herein reflects review and annotations entered in association with encounter. Interpretation of such information and data should be left to medically-trained personnel. Information provided to patient can be located elsewhere in the medical record under "Patient Instructions". Document created using STT-dictation technology, any transcriptional errors that may result from process are unintentional.    Patient: Vernelle Emerald  Service Category: E/M  Provider: Oswaldo Done, MD  DOB: December 22, 1960  DOS: 11/16/2022  Referring Provider: Saralyn Pilar *  MRN: [id number]  Specialty: Interventional Pain Management  PCP: Smitty Cords, DO  Type: Established Patient  Setting: Ambulatory outpatient    Location: Office  Delivery: Face-to-face     HPI  Mr. Jaevon Grech, a 62 y.o. year old male, is here today because of his No primary diagnosis found.. Mr. Gandee primary complain today is No chief complaint on file.  Pertinent problems: Mr. Martina has Chronic pain syndrome; DDD (degenerative disc disease), lumbar; Primary osteoarthritis involving multiple joints; Weakness of lower extremities (Bilateral); Chronic lower extremity pain (1ry area of Pain) (Bilateral); Chronic low back pain (4th area of Pain) (Bilateral) w/ sciatica (Bilateral); Chronic shoulder pain (5th area of Pain) (Left); Chronic hip pain (2ry area of Pain) (Bilateral); Chronic knee pain (3ry area of Pain) (Bilateral); Osteoarthritis of hips (Bilateral); Osteoarthritis of hip (Left); Osteoarthritis of hip (Right); Osteoarthritis of knee (Left); Lumbosacral radiculopathy at L5 (Left); Diabetic neuropathy (HCC); Osteoarthritis of AC (acromioclavicular) joint (Left); Osteoarthritis of shoulder (Left); Rotator cuff arthropathy of shoulder (Left); Lumbosacral facet hypertrophy (L4-5, L5-S1); Lumbar facet syndrome; Abnormal MRI, hip (04/21/2021); Osteoarthritis of knees (Bilateral)  (L>R); Abnormal EMG (electromyogram) (03/16/2021); Polyneuropathy, peripheral sensorimotor axonal; Diabetic sensorimotor polyneuropathy (HCC); Abnormal MRI, lumbar spine (05/21/2021); Chronic arthropathy; 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 Pain Assessment: Severity of   is reported as a  /10. Location:    / . Onset:  . Quality:  . Timing:  . Modifying factor(s):  Marland Kitchen Vitals:  vitals were not taken for this visit.  BMI: Estimated body mass index is 46.05 kg/m as calculated from the following:   Height as of 09/23/22: 6' (1.829 m).   Weight as of 09/23/22: 339 lb 8.1 oz (154 kg). Last encounter: 08/29/2022. Last procedure: 03/01/2022.  Reason for encounter:  *** . ***  Pharmacotherapy Assessment  Analgesic: Oxycodone/APAP 10/325 (# 60/month), 1 tab p.o. twice daily (last filled on 02/03/2021) Needs to loose an average of 2.75 lbs/mo to be considered for opioid pharmacotherapy.  MME/day: 30 mg/day   Monitoring: [place] PMP: PDMP reviewed during this encounter.       Pharmacotherapy: No side-effects or adverse reactions reported. Compliance: No problems identified. Effectiveness: Clinically acceptable.  No notes on file  No results found for: "CBDTHCR" No results found for: "D8THCCBX" No results found for: "D9THCCBX"  UDS:  Summary  Date Value Ref Range Status  02/16/2022 Note  Final    Comment:    ==================================================================== [employer] (MW) ==================================================================== Test                              Result       Flag  Units  Drug Present and Declared for Prescription Verification   Oxycodone                      2223         EXPECTED   ng/mg creat   Oxymorphone                    1208         EXPECTED   ng/mg creat   Noroxycodone                   3040         EXPECTED   ng/mg creat   Noroxymorphone                 475          EXPECTED   ng/mg creat    Sources of oxycodone are scheduled prescription medications.    Oxymorphone, noroxycodone, and noroxymorphone are expected    metabolites of oxycodone. Oxymorphone is also available as a    scheduled prescription medication.  ==================================================================== Test                      Result    Flag   Units      Ref Range   Creatinine              93               mg/dL      >=16 ==================================================================== Declared Medications:  The flagging and interpretation on this report are based on the  following declared medications.  Unexpected results may arise from  inaccuracies in the declared medications.   **Note: The testing scope of this panel includes these medications:   Oxycodone   **Note: The testing scope of this panel does not include the  following reported medications:   Albuterol  Atorvastatin  Dapagliflozin (Farxiga)  Fluticasone (Flovent HFA)  Furosemide (Lasix)  Hydrochlorothiazide  Insulin Evaristo Bury)  Liraglutide (Victoza)  Losartan (Cozaar)  Metformin  Naloxone (Narcan)  Pantoprazole (Protonix)  Pregabalin (Lyrica)  Semaglutide (Ozempic)  Varenicline (Chantix) ==================================================================== For clinical consultation, please call [phone]. ====================================================================       ROS  Constitutional: Denies any fever or chills Gastrointestinal: No reported hemesis, hematochezia, vomiting, or acute GI distress Musculoskeletal: Denies any acute  onset joint swelling, redness, loss of ROM, or weakness Neurological: No reported episodes of acute onset apraxia, aphasia, dysarthria, agnosia, amnesia, paralysis, loss of coordination, or loss of consciousness  Medication Review  OneTouch Delica Lancets 33G, OneTouch Verio, Oxycodone HCl, Pen Needles 3/16", Semaglutide (1 MG/DOSE), albuterol, atorvastatin, cyclobenzaprine, fluticasone-salmeterol, furosemide, glucose blood, lactulose, loratadine, losartan, metFORMIN, naloxone, polyethylene glycol, pregabalin, and varenicline  History Review  Allergy: Mr. Ragazzo has No Known Allergies. Drug: Mr. Clemensen  reports no history of drug use. Alcohol:  reports no history of alcohol use. Tobacco:  reports that he has been smoking cigarettes. He has a 2.5 pack-year smoking history. He has never used smokeless tobacco. Social: Mr. Gehres  reports that he has been smoking cigarettes. He has a 2.5 pack-year smoking history. He has never used smokeless tobacco. He reports that he does not drink alcohol and does not use drugs. Medical:  has a past medical history of Arthritis, Diabetes mellitus without complication (HCC), Hyperlipidemia, and Hypertension. Surgical: Mr. Halliwell  has a past surgical history that includes Colonoscopy with propofol; Appendectomy; and Colonoscopy with propofol (N/A, 04/23/2021).  Family: family history includes Arthritis in his father and mother; Heart disease in his mother.  Laboratory Chemistry Profile   Renal Lab Results  Component Value Date   BUN 9 09/25/2022   CREATININE 0.57 (L) 09/25/2022   BCR 18 06/25/2021   GFRNONAA >60 09/25/2022    Hepatic Lab Results  Component Value Date   AST 9 (L) 09/24/2022   ALT 6 09/24/2022   ALBUMIN 3.2 (L) 09/24/2022   ALKPHOS 82 09/24/2022    Electrolytes Lab Results  Component Value Date   NA 139 09/25/2022   K 3.7 09/25/2022   CL 101 09/25/2022   CALCIUM 8.4 (L) 09/25/2022   MG 2.2 09/25/2022   PHOS 3.5  09/25/2022    Bone Lab Results  Component Value Date   25OHVITD1 13 (L) 02/15/2021   25OHVITD2 <1.0 02/15/2021   25OHVITD3 12 02/15/2021    Inflammation (CRP: Acute Phase) (ESR: Chronic Phase) Lab Results  Component Value Date   CRP 29 (H) 02/15/2021   ESRSEDRATE 39 (H) 02/15/2021         Note: Above Lab results reviewed.  Recent Imaging Review  DG HIPS BILAT WITH PELVIS MIN 5 VIEWS CLINICAL DATA:  Bilateral leg pain  EXAM: DG HIP (WITH OR WITHOUT PELVIS) 5+V BILAT  COMPARISON:  MRI 04/20/2021, radiograph 02/23/2021  FINDINGS: SI joints are non widened.  pubic symphysis and rami appear intact.  Left hip: Severe arthritis of the left hip with superior bone on bone appearance and subarticular sclerosis. Bulky spurring inferiorly.  Right hip:  IMPRESSION: Severe right greater than left hip arthritis without acute osseous abnormality  Electronically Signed   By: Jasmine Pang M.D.   On: 09/23/2022 18:11 Note: Reviewed        Physical Exam  General appearance: Well nourished, well developed, and well hydrated. In no apparent acute distress Mental status: Alert, oriented x 3 (person, place, & time)       Respiratory: No evidence of acute respiratory distress Eyes: PERLA Vitals: There were no vitals taken for this visit. BMI: Estimated body mass index is 46.05 kg/m as calculated from the following:   Height as of 09/23/22: 6' (1.829 m).   Weight as of 09/23/22: 339 lb 8.1 oz (154 kg). Ideal: Patient weight not recorded  Assessment   Diagnosis Status  No diagnosis found. Controlled Controlled Controlled   Updated Problems: No problems updated.  Plan of Care  Problem-specific:  No problem-specific Assessment & Plan notes found for this encounter.  Mr. Devian Tata has a current medication list which includes the following long-term medication(s): albuterol, atorvastatin, fluticasone-salmeterol, furosemide, loratadine, losartan, metformin, oxycodone  hcl, and pregabalin.  Pharmacotherapy (Medications Ordered): No orders of the defined types were placed in this encounter.  Orders:  No orders of the defined types were placed in this encounter.  Follow-up plan:   No follow-ups on file.      Interventional Therapies  Risk Factors  Considerations:   Goal: BMI down to 30 kg/m (Goal: 220 lbs for his height). Must lose 2.75 lbs/month. (03/01/2022 - 315 Lbs.) (08/29/2022 - 339 Lbs) (24 Lbs increase) Class 3 MO  SOBOE  T2DM  BA  HTN   Planned  Pending:   NO MORE PROCEDURES UNTIL BMI IS <35 (very hard to move onto bed)   Under consideration:   NO MORE PROCEDURES UNTIL BMI IS <35 (very hard to move onto bed)   Completed:   Therapeutic right L2-3 LESI x2 (03/01/2022) ( Diagnostic/therapeutic right L3-4 LESI  x1 (11/04/21) (100/100/25/25) (100% of LBP)  Diagnostic/therapeutic right L4-5 LESI x2 (11/30/2021) (100/100/100/80)  Toradol/Norflex IM 60/60 mg (03/29/2021)  Referral to orthopedic surgery for evaluation of knees and hip (03/29/2021)  Referral to medical weight management (02/15/2021)  Referral to bariatric surgery (02/15/2021)    Completed by other providers:   [facility]  EMG/PNCV (LE) by Theora Master, MD [facility] neurology) (03/16/2021) (chronic, severe sensorimotor polyneuropathy)   Therapeutic  Palliative (PRN) options:   None established       Recent Visits Date Type Provider Dept  08/29/22 Office Visit Delano Metz, MD [facility]  Showing recent visits within past 90 days and meeting all other requirements Future Appointments Date Type Provider Dept  11/16/22 Appointment Delano Metz, MD [facility]  Showing future appointments within next 90 days and meeting all other requirements  I discussed the assessment and treatment plan with the patient. The patient was provided an opportunity to ask questions and all were answered. The patient agreed  with the plan and demonstrated an understanding of the instructions.  Patient advised to call back or seek an in-person evaluation if the symptoms or condition worsens.  Duration of encounter: *** minutes.  Total time on encounter, as per AMA guidelines included both the face-to-face and non-face-to-face time personally spent by the physician and/or other qualified health care professional(s) on the day of the encounter (includes time in activities that require the physician or other qualified health care professional and does not include time in activities normally performed by clinical staff). Physician's time may include the following activities when performed: Preparing to see the patient (e.g., pre-charting review of records, searching for previously ordered imaging, lab work, and nerve conduction tests) Review of prior analgesic pharmacotherapies. Reviewing PMP Interpreting ordered tests (e.g., lab work, imaging, nerve conduction tests) Performing post-procedure evaluations, including interpretation of diagnostic procedures Obtaining and/or reviewing separately obtained history Performing a medically appropriate examination and/or evaluation Counseling and educating the patient/family/caregiver Ordering medications, tests, or procedures Referring and communicating with other health care professionals (when not separately reported) Documenting clinical information in the electronic or other health record Independently interpreting results (not separately reported) and communicating results to the patient/ family/caregiver Care coordination (not separately reported)  Note by: Oswaldo Done, MD Date: 11/16/2022; Time: 1:01 PM

## 2022-11-16 ENCOUNTER — Encounter: Payer: 59 | Admitting: Pain Medicine

## 2022-11-16 ENCOUNTER — Ambulatory Visit: Payer: 59 | Attending: Pain Medicine | Admitting: Pain Medicine

## 2022-11-16 ENCOUNTER — Encounter: Payer: Self-pay | Admitting: Pain Medicine

## 2022-11-16 VITALS — BP 155/99 | HR 82 | Temp 97.7°F | Resp 18 | Ht 78.0 in | Wt 339.0 lb

## 2022-11-16 DIAGNOSIS — M150 Primary generalized (osteo)arthritis: Secondary | ICD-10-CM | POA: Diagnosis present

## 2022-11-16 DIAGNOSIS — E662 Morbid (severe) obesity with alveolar hypoventilation: Secondary | ICD-10-CM | POA: Diagnosis present

## 2022-11-16 DIAGNOSIS — Z6841 Body Mass Index (BMI) 40.0 and over, adult: Secondary | ICD-10-CM | POA: Insufficient documentation

## 2022-11-16 DIAGNOSIS — D72820 Lymphocytosis (symptomatic): Secondary | ICD-10-CM | POA: Insufficient documentation

## 2022-11-16 DIAGNOSIS — E1169 Type 2 diabetes mellitus with other specified complication: Secondary | ICD-10-CM | POA: Insufficient documentation

## 2022-11-16 DIAGNOSIS — M79604 Pain in right leg: Secondary | ICD-10-CM | POA: Diagnosis present

## 2022-11-16 DIAGNOSIS — Z79899 Other long term (current) drug therapy: Secondary | ICD-10-CM | POA: Insufficient documentation

## 2022-11-16 DIAGNOSIS — M25562 Pain in left knee: Secondary | ICD-10-CM | POA: Insufficient documentation

## 2022-11-16 DIAGNOSIS — M79605 Pain in left leg: Secondary | ICD-10-CM | POA: Insufficient documentation

## 2022-11-16 DIAGNOSIS — R7982 Elevated C-reactive protein (CRP): Secondary | ICD-10-CM | POA: Diagnosis present

## 2022-11-16 DIAGNOSIS — M5441 Lumbago with sciatica, right side: Secondary | ICD-10-CM | POA: Insufficient documentation

## 2022-11-16 DIAGNOSIS — Z789 Other specified health status: Secondary | ICD-10-CM | POA: Diagnosis present

## 2022-11-16 DIAGNOSIS — M129 Arthropathy, unspecified: Secondary | ICD-10-CM | POA: Diagnosis present

## 2022-11-16 DIAGNOSIS — G894 Chronic pain syndrome: Secondary | ICD-10-CM | POA: Insufficient documentation

## 2022-11-16 DIAGNOSIS — M25552 Pain in left hip: Secondary | ICD-10-CM | POA: Insufficient documentation

## 2022-11-16 DIAGNOSIS — D649 Anemia, unspecified: Secondary | ICD-10-CM | POA: Insufficient documentation

## 2022-11-16 DIAGNOSIS — M25512 Pain in left shoulder: Secondary | ICD-10-CM | POA: Diagnosis present

## 2022-11-16 DIAGNOSIS — E66813 Obesity, class 3: Secondary | ICD-10-CM | POA: Diagnosis present

## 2022-11-16 DIAGNOSIS — G8929 Other chronic pain: Secondary | ICD-10-CM | POA: Diagnosis present

## 2022-11-16 DIAGNOSIS — Z79891 Long term (current) use of opiate analgesic: Secondary | ICD-10-CM | POA: Diagnosis present

## 2022-11-16 DIAGNOSIS — M47816 Spondylosis without myelopathy or radiculopathy, lumbar region: Secondary | ICD-10-CM | POA: Insufficient documentation

## 2022-11-16 DIAGNOSIS — M5442 Lumbago with sciatica, left side: Secondary | ICD-10-CM | POA: Diagnosis present

## 2022-11-16 DIAGNOSIS — M25561 Pain in right knee: Secondary | ICD-10-CM | POA: Insufficient documentation

## 2022-11-16 DIAGNOSIS — M25551 Pain in right hip: Secondary | ICD-10-CM | POA: Insufficient documentation

## 2022-11-16 DIAGNOSIS — R700 Elevated erythrocyte sedimentation rate: Secondary | ICD-10-CM | POA: Diagnosis present

## 2022-11-16 DIAGNOSIS — E559 Vitamin D deficiency, unspecified: Secondary | ICD-10-CM | POA: Insufficient documentation

## 2022-11-16 DIAGNOSIS — M899 Disorder of bone, unspecified: Secondary | ICD-10-CM | POA: Insufficient documentation

## 2022-11-16 MED ORDER — OXYCODONE HCL 10 MG PO TABS: 10.0000 mg | ORAL_TABLET | Freq: Three times a day (TID) | ORAL | 0 refills | Status: DC | PRN

## 2022-11-16 NOTE — Patient Instructions (Addendum)
Pick up dates: 11/16/22; 12/15/22; 01/15/23 ______________________________________________________________________    Patient information on: Body mass index (BMI) and Weight Management  Dear Mr. Meade you are receiving this information because your weight may be adversely affecting your health.   Your current Estimated body mass index is 46.05 kg/m as calculated from the following:   Height as of 09/23/22: 6' (1.829 m).   Weight as of 09/23/22: 339 lb 8.1 oz (154 kg).  We recommend you talk to your primary care physician about providing or referring you to a supervised weight management program.  Here is some information about weight and the body mass index (BMI) classification:  BMI is a measure of obesity that's calculated by dividing a person's weight in kilograms by their height in meters squared. A person can use an online calculator to determine their BMI. Body mass index (BMI) is a common tool for deciding whether a person has an appropriate body weight.  It measures a person's weight in relation to their height.  According to the [facility] of health (NIH): A BMI of less than 18.5 means that a person is underweight. A BMI of between 18.5 and 24.9 is ideal. A BMI of between 25 and 29.9 is overweight. A BMI over 30 indicates obesity.  Body Mass Index (BMI) Classification BMI level (kg/m2) Category Associated incidence of chronic pain  <18  Underweight   18.5-24.9 Ideal body weight   25-29.9 Overweight  20%  30-34.9 Obese (Class I)  68%  35-39.9 Severe obesity (Class II)  136%  >40 Extreme obesity (Class III)  254%    Morbidly Obese Classification: You will be considered to be "Morbidly Obese" if your BMI is above 30 and you have one or more of the following conditions caused or associated to obesity: 1.    Type 2 Diabetes (Leading to cardiovascular diseases (CVD), stroke, peripheral vascular diseases (PVD), retinopathy, nephropathy, and neuropathy) 2.     Cardiovascular Disease (High Blood Pressure; Congestive Heart Failure; High Cholesterol; Coronary Artery Disease; Angina; Arrhythmias, Dysrhythmias, or Heart Attacks) 3.    Breathing problems (Asthma; obesity-hypoventilation syndrome; obstructive sleep apnea; chronic inflammatory airway disease; reactive airway disease; or shortness of breath) 4.    Chronic kidney disease 5.    Liver disease (nonalcoholic fatty liver disease) 6.    High blood pressure 7.    Acid reflux (gastroesophageal reflux disease; heartburn) 8.    Osteoarthritis (OA) (affecting the hip(s), the knee(s) and/or the lower back) (usually requiring knee and/or hip replacements, as well as back surgeries) 9.    Low back pain (Lumbar Facet Syndrome; and/or Degenerative Disc Disease) 10.  Hip pain (Osteoarthritis of hip) (For every 1 lbs of added body weight, there is a 2 lbs increase in pressure inside of each hip articulation. 1:2 mechanical relationship) 11.  Knee pain (Osteoarthritis of knee) (For every 1 lbs of added body weight, there is a 4 lbs increase in pressure inside of each knee articulation. 1:4 mechanical relationship) (patients with a BMI>30 kg/m2 were 6.8 times more likely to develop knee OA than normal-weight individuals) 12.  Cancer: Epidemiological studies have shown that obesity is a risk factor for: post-menopausal breast cancer; cancers of the endometrium, colon and kidney cancer; malignant adenomas of the esophagus. Obese subjects have an approximately 1.5-3.5-fold increased risk of developing these cancers compared with normal-weight subjects, and it has been estimated that between 15 and 45% of these cancers can be attributed to overweight. More recent studies suggest that obesity may also increase  the risk of other types of cancer, including pancreatic, hepatic and gallbladder cancer. (Ref: Obesity and cancer. Pischon T, Nthlings U, [employer] H. Proc Nutr Soc. 2008 May;67(2):128-45. doi: 10.1017/[id number].) The  International Agency for Research on Cancer (IARC) has identified 13 cancers associated with overweight and obesity: meningioma, multiple myeloma, adenocarcinoma of the esophagus, and cancers of the thyroid, postmenopausal breast cancer, gallbladder, stomach, liver, pancreas, kidney, ovaries, uterus, colon and rectal (colorectal) cancers. 55 percent of all cancers diagnosed in women and 24 percent of those diagnosed in men are associated with overweight and obesity.  Recommendation: If you have any of the above conditions it is urgent that you take a step back and concentrate in losing weight. Dedicate 100% of your efforts on this task. Nothing else will improve your health more than bringing your weight down and your BMI to less than 30.   Nutritionist and/or supervised weight-management program: We are aware that most chronic pain patients are unable to exercise secondary to their pain. For this reason, you must rely on proper nutrition and diet in order to lose the weight. We recommend you talk to a nutritionist.   Bariatric surgery: A person might be considered a candidate for bariatric surgery if they meet one of the following BMI criteria:  BMI of 40 or higher: This is considered extreme obesity (Class III). BMI of 35-39.9: This is considered obesity, and the person might also have a serious weight-related health condition, such as high blood pressure, type 2 diabetes, or severe sleep apnea  BMI of 30-34.9: This might be considered if the person has serious weight-related health problems and hasn't had substantial weight loss or improvement in co-morbidities through other methods   On your own: A realistic goal is to lose 10% of your body weight over a period of 12 months.  If over a period of six (6) months you have unsuccessfully tried to lose weight, then it is time for you to seek professional help and to enter a medically supervised weight management program, and/or undergo bariatric surgery.    Pain management considerations and possible limitations:  1.    Pharmacological Problems: Be advised that the use of opioid analgesics (oxycodone; hydrocodone; morphine; methadone; codeine; and all of their derivatives) have been associated with decreased metabolism and weight gain.  For this reason, should we see that you are unable to lose weight while taking these medications, it may become necessary for [place] to taper down and indefinitely discontinue them.  2.    Technical Problems: The incidence of successful interventional therapies decreases as the patient's BMI increases. It is much more difficult to accomplish a safe and effective interventional therapy on a patient with a BMI above 35. 3.    Radiation Exposure Problems: The x-rays machine, used to accomplish injection therapies, will automatically increase their x-ray output in order to capture an appropriate bone image. This means that radiation exposure increases exponentially with the patient's BMI. (The higher the BMI, the higher the radiation exposure.) Although the level of radiation used at a given time is still safe to the patient, it is not for the physician and/or assisting staff. Unfortunately, radiation exposure is accumulative. Because physicians and the staff have to do procedures and be exposed on a daily basis, this can result in health problems such as cancer and radiation burns. Radiation exposure to the staff is monitored by the radiation batches that they wear. The exposure levels are reported back to the staff on a quarterly basis. Depending  on levels of exposure, physicians and staff may be obligated by law to decrease this exposure. This means that they have the right and obligation to refuse providing therapies where they may be overexposed to radiation. For this reason, physicians may decline to offer therapies such as radiofrequency ablation or implants to patients with a BMI above 40. 4.    Current Trends: Be advised that  the current trend is to no longer offer certain therapies to patients with a BMI equal to, or above 35, due to increase perioperative risks, increased technical procedural difficulties, and excessive radiation exposure to healthcare personnel.  Last updated: 10/10/2022 ______________________________________________________________________     ______________________________________________________________________    Opioid Pain Medication Update  To: All patients taking opioid pain medications. (I.e.: hydrocodone, hydromorphone, oxycodone, oxymorphone, morphine, codeine, methadone, tapentadol, tramadol, buprenorphine, fentanyl, etc.)  Re: Updated review of side effects and adverse reactions of opioid analgesics, as well as new information about long term effects of this class of medications.  Direct risks of long-term opioid therapy are not limited to opioid addiction and overdose. Potential medical risks include serious fractures, breathing problems during sleep, hyperalgesia, immunosuppression, chronic constipation, bowel obstruction, myocardial infarction, and tooth decay secondary to xerostomia.  Unpredictable adverse effects that can occur even if you take your medication correctly: Cognitive impairment, respiratory depression, and death. Most people think that if they take their medication "correctly", and "as instructed", that they will be safe. Nothing could be farther from the truth. In reality, a significant amount of recorded deaths associated with the use of opioids has occurred in individuals that had taken the medication for a long time, and were taking their medication correctly. The following are examples of how this can happen: Patient taking his/her medication for a long time, as instructed, without any side effects, is given a certain antibiotic or another unrelated medication, which in turn triggers a "Drug-to-drug interaction" leading to disorientation, cognitive impairment,  impaired reflexes, respiratory depression or an untoward event leading to serious bodily harm or injury, including death.  Patient taking his/her medication for a long time, as instructed, without any side effects, develops an acute impairment of liver and/or kidney function. This will lead to a rapid inability of the body to breakdown and eliminate their pain medication, which will result in effects similar to an "overdose", but with the same medicine and dose that they had always taken. This again may lead to disorientation, cognitive impairment, impaired reflexes, respiratory depression or an untoward event leading to serious bodily harm or injury, including death.  A similar problem will occur with patients as they grow older and their liver and kidney function begins to decrease as part of the aging process.  Background information: Historically, the original case for using long-term opioid therapy to treat chronic noncancer pain was based on safety assumptions that subsequent experience has called into question. In 1996, the American Pain Society and the American Academy of Pain Medicine issued a consensus statement supporting long-term opioid therapy. This statement acknowledged the dangers of opioid prescribing but concluded that the risk for addiction was low; respiratory depression induced by opioids was short-lived, occurred mainly in opioid-naive patients, and was antagonized by pain; tolerance was not a common problem; and efforts to control diversion should not constrain opioid prescribing. This has now proven to be wrong. Experience regarding the risks for opioid addiction, misuse, and overdose in community practice has failed to support these assumptions.  According to the Centers for Disease Control and Prevention, fatal overdoses  involving opioid analgesics have increased sharply over the past decade. Currently, more than 96,700 people die from drug overdoses every year. Opioids are a factor  in 7 out of every 10 overdose deaths. Deaths from drug overdose have surpassed motor vehicle accidents as the leading cause of death for individuals between the ages of 74 and 34.  Clinical data suggest that neuroendocrine dysfunction may be very common in both men and women, potentially causing hypogonadism, erectile dysfunction, infertility, decreased libido, osteoporosis, and depression. Recent studies linked higher opioid dose to increased opioid-related mortality. Controlled observational studies reported that long-term opioid therapy may be associated with increased risk for cardiovascular events. Subsequent meta-analysis concluded that the safety of long-term opioid therapy in elderly patients has not been proven.   Side Effects and adverse reactions: Common side effects: Drowsiness (sedation). Dizziness. Nausea and vomiting. Constipation. Physical dependence -- Dependence often manifests with withdrawal symptoms when opioids are discontinued or decreased. Tolerance -- As you take repeated doses of opioids, you require increased medication to experience the same effect of pain relief. Respiratory depression -- This can occur in healthy people, especially with higher doses. However, people with COPD, asthma or other lung conditions may be even more susceptible to fatal respiratory impairment.  Uncommon side effects: An increased sensitivity to feeling pain and extreme response to pain (hyperalgesia). Chronic use of opioids can lead to this. Delayed gastric emptying (the process by which the contents of your stomach are moved into your small intestine). Muscle rigidity. Immune system and hormonal dysfunction. Quick, involuntary muscle jerks (myoclonus). Arrhythmia. Itchy skin (pruritus). Dry mouth (xerostomia).  Long-term side effects: Chronic constipation. Sleep-disordered breathing (SDB). Increased risk of bone fractures. Hypothalamic-pituitary-adrenal dysregulation. Increased  risk of overdose.  RISKS: Respiratory depression and death: Opioids increase the risk of respiratory depression and death.  Drug-to-drug interactions: Opioids are relatively contraindicated in combination with benzodiazepines, sleep inducers, and other central nervous system depressants. Other classes of medications (i.e.: certain antibiotics and even over-the-counter medications) may also trigger or induce respiratory depression in some patients.  Medical conditions: Patients with pre-existing respiratory problems are at higher risk of respiratory failure and/or depression when in combination with opioid analgesics. Opioids are relatively contraindicated in some medical conditions such as central sleep apnea.   Fractures and Falls:  Opioids increase the risk and incidence of falls. This is of particular importance in elderly patients.  Endocrine System:  Long-term administration is associated with endocrine abnormalities (endocrinopathies). (Also known as Opioid-induced Endocrinopathy) Influences on both the hypothalamic-pituitary-adrenal axis?and the hypothalamic-pituitary-gonadal axis have been demonstrated with consequent hypogonadism and adrenal insufficiency in both sexes. Hypogonadism and decreased levels of dehydroepiandrosterone sulfate have been reported in men and women. Endocrine effects include: Amenorrhoea in women (abnormal absence of menstruation) Reduced libido in both sexes Decreased sexual function Erectile dysfunction in men Hypogonadisms (decreased testicular function with shrinkage of testicles) Infertility Depression and fatigue Loss of muscle mass Anxiety Depression Immune suppression Hyperalgesia Weight gain Anemia Osteoporosis Patients (particularly women of childbearing age) should avoid opioids. There is insufficient evidence to recommend routine monitoring of asymptomatic patients taking opioids in the long-term for hormonal deficiencies.  Immune  System: Human studies have demonstrated that opioids have an immunomodulating effect. These effects are mediated via opioid receptors both on immune effector cells and in the central nervous system. Opioids have been demonstrated to have adverse effects on antimicrobial response and anti-tumour surveillance. Buprenorphine has been demonstrated to have no impact on immune function.  Opioid Induced Hyperalgesia: Human studies have demonstrated  that prolonged use of opioids can lead to a state of abnormal pain sensitivity, sometimes called opioid induced hyperalgesia (OIH). Opioid induced hyperalgesia is not usually seen in the absence of tolerance to opioid analgesia. Clinically, hyperalgesia may be diagnosed if the patient on long-term opioid therapy presents with increased pain. This might be qualitatively and anatomically distinct from pain related to disease progression or to breakthrough pain resulting from development of opioid tolerance. Pain associated with hyperalgesia tends to be more diffuse than the pre-existing pain and less defined in quality. Management of opioid induced hyperalgesia requires opioid dose reduction.  Cancer: Chronic opioid therapy has been associated with an increased risk of cancer among noncancer patients with chronic pain. This association was more evident in chronic strong opioid users. Chronic opioid consumption causes significant pathological changes in the small intestine and colon. Epidemiological studies have found that there is a link between opium dependence and initiation of gastrointestinal cancers. Cancer is the second leading cause of death after cardiovascular disease. Chronic use of opioids can cause multiple conditions such as GERD, immunosuppression and renal damage as well as carcinogenic effects, which are associated with the incidence of cancers.   Mortality: Long-term opioid use has been associated with increased mortality among patients with chronic  non-cancer pain (CNCP).  Prescription of long-acting opioids for chronic noncancer pain was associated with a significantly increased risk of all-cause mortality, including deaths from causes other than overdose.  Reference: Von Korff M, Kolodny A, Deyo RA, Chou R. Long-term opioid therapy reconsidered. Ann Intern Med. 2011 Sep 6;155(5):325-8. doi: 10.7326/[id number]. PMID: 16109604; PMCID: [id number]. Randon Goldsmith, Hayward RA, Dunn KM, [place] KP. Risk of adverse events in patients prescribed long-term opioids: A cohort study in the [place] [facility] Research Datalink. Eur J Pain. 2019 May;23(5):908-922. doi: 10.1002/ejp.1357. Epub 2019 Jan 31. PMID: 78295621. Colameco S, Coren JS, [place]o CA. Continuous opioid treatment for chronic noncancer pain: a time for moderation in prescribing. Postgrad Med. 2009 Jul;121(4):61-6. doi: 10.3810/pgm.2009.07.2032. PMID: 30865784. William Hamburger RN, [place], Blazina I, Cristopher [place], Bougatsos C, Deyo RA. The effectiveness and risks of long-term opioid therapy for chronic pain: a systematic review for a [employer] of [employer] Pathways to [employer]. Ann Intern Med. 2015 Feb 17;162(4):276-86. doi: 10.7326/M14-2559. PMID: 69629528. Caryl Bis [facility], [place] DM. NCHS Data Brief No. 22. [place]: Centers for Disease Control and Prevention; 2009. Sep, Increase in Fatal Poisonings Involving Opioid Analgesics in the [place], 1999-2006. Song IA, Choi HR, Oh TK. Long-term opioid use and mortality in patients with chronic non-cancer pain: Ten-year follow-up study in [place] from 2010 through 2019. EClinicalMedicine. 2022 Jul 18;51:101558. doi: 10.1016/j.eclinm.2022.413244. PMID: 01027253; PMCID: [id number]. Huser, W., Schubert, T., Vogelmann, T. et al. All-cause mortality in patients with long-term opioid therapy compared with non-opioid analgesics for chronic non-cancer pain: a database study. BMC  Med 18, 162 (2020). http://lester.info/ Rashidian H, Karie Kirks, Malekzadeh R, Haghdoost AA. An Ecological Study of the Association between Opiate Use and Incidence of Cancers. Addict Health. 2016 Fall;8(4):252-260. PMID: 59563875; PMCID: [id number].  Our Goal: Our goal is to control your pain with means other than the use of opioid pain medications.  Our Recommendation: Talk to your physician about coming off of these medications. We can assist you with the tapering down and stopping these medicines. Based on the new information, even if you cannot completely stop the medication, a decrease in the dose may be associated with a lesser risk.  Ask for other means of controlling the pain. Decrease or eliminate those factors that significantly contribute to your pain such as smoking, obesity, and a diet heavily tilted towards "inflammatory" nutrients.  Last Updated: 07/25/2022   ______________________________________________________________________       ______________________________________________________________________    National Pain Medication Shortage  The [place] is experiencing worsening drug shortages. These have had a negative widespread effect on patient care and treatment. Not expected to improve any time soon. Predicted to last past 2029.   Drug shortage list (generic names) Oxycodone IR Oxycodone/APAP Oxymorphone IR Hydromorphone Hydrocodone/APAP Morphine  Where is the problem?  Manufacturing and supply level.  Will this shortage affect you?  Only if you take any of the above pain medications.  How? You may be unable to fill your prescription.  Your pharmacist may offer a "partial fill" of your prescription. (Warning: Do not accept partial fills.) Prescriptions partially filled cannot be transferred to another pharmacy. Read our Medication Rules and Regulation. Depending on how much medicine you are dependent on, you may experience  withdrawals when unable to get the medication.  Recommendations: Consider ending your dependence on opioid pain medications. Ask your pain specialist to assist you with the process. Consider switching to a medication currently not in shortage, such as Buprenorphine. Talk to your pain specialist about this option. Consider decreasing your pain medication requirements by managing tolerance thru "Drug Holidays". This may help minimize withdrawals, should you run out of medicine. Control your pain thru the use of non-pharmacological interventional therapies.   Your prescriber: Prescribers cannot be blamed for shortages. Medication manufacturing and supply issues cannot be fixed by the prescriber.   NOTE: The prescriber is not responsible for supplying the medication, or solving supply issues. Work with your pharmacist to solve it. The patient is responsible for the decision to take or continue taking the medication and for identifying and securing a legal supply source. By law, supplying the medication is the job and responsibility of the pharmacy. The prescriber is responsible for the evaluation, monitoring, and prescribing of these medications.   Prescribers will NOT: Re-issue prescriptions that have been partially filled. Re-issue prescriptions already sent to a pharmacy.  Re-send prescriptions to a different pharmacy because yours did not have your medication. Ask pharmacist to order more medicine or transfer the prescription to another pharmacy. (Read below.)  New 2023 regulation: "September 17, 2021 Revised Regulation Allows DEA-Registered Pharmacies to Transfer Electronic Prescriptions at a Patient's Request DEA Headquarters Division - Public Information Office Patients now have the ability to request their electronic prescription be transferred to another pharmacy without having to go back to their practitioner to initiate the request. This revised regulation went into effect on Monday,  September 13, 2021.     At a patient's request, a DEA-registered retail pharmacy can now transfer an electronic prescription for a controlled substance (schedules II-V) to another DEA-registered retail pharmacy. Prior to this change, patients would have to go through their practitioner to cancel their prescription and have it re-issued to a different pharmacy. The process was taxing and time consuming for both patients and practitioners.    The Drug Enforcement Administration [facility]) published its intent to revise the process for transferring electronic prescriptions on December 06, 2019.  The final rule was published in the federal register on August 12, 2021 and went into effect 30 days later.  Under the final rule, a prescription can only be transferred once between pharmacies, and only if allowed under existing state or other  applicable law. The prescription must remain in its electronic form; may not be altered in any way; and the transfer must be communicated directly between two licensed pharmacists. It's important to note, any authorized refills transfer with the original prescription, which means the entire prescription will be filled at the same pharmacy".  Reference: [email or web address] [facility] website announcement)  [email or web address] Financial pl[employer] of [employer])   [employer] / Vol. 88, No. 143 / Thursday, August 12, 2021 / Rules and Regulations DEPARTMENT OF JUSTICE  Drug Enforcement Administration  21 CFR Part 1306  [Docket No. [id number]]  RIN [id number] Transfer of Electronic Prescriptions for Schedules II-V Controlled Substances Between Pharmacies for Initial Filling  ______________________________________________________________________       ______________________________________________________________________    Transfer of  Pain Medication between Pharmacies  Re: 2023 DEA Clarification on existing regulation  Published on DEA Website: September 17, 2021  Title: Revised Regulation Allows DEA-Registered Pharmacies to Electrical engineer Prescriptions at a Patient's Request DEA Headquarters Division - [employer]  "Patients now have the ability to request their electronic prescription be transferred to another pharmacy without having to go back to their practitioner to initiate the request. This revised regulation went into effect on Monday, September 13, 2021.     At a patient's request, a DEA-registered retail pharmacy can now transfer an electronic prescription for a controlled substance (schedules II-V) to another DEA-registered retail pharmacy. Prior to this change, patients would have to go through their practitioner to cancel their prescription and have it re-issued to a different pharmacy. The process was taxing and time consuming for both patients and practitioners.    The Drug Enforcement Administration [facility]) published its intent to revise the process for transferring electronic prescriptions on December 06, 2019.  The final rule was published in the federal register on August 12, 2021 and went into effect 30 days later.  Under the final rule, a prescription can only be transferred once between pharmacies, and only if allowed under existing state or other applicable law. The prescription must remain in its electronic form; may not be altered in any way; and the transfer must be communicated directly between two licensed pharmacists. It's important to note, any authorized refills transfer with the original prescription, which means the entire prescription will be filled at the same pharmacy."    REFERENCES: 1. DEA website announcement [email or web address]  2. Department of Justice website   [email or web address]  3. DEPARTMENT OF JUSTICE Drug Enforcement Administration 21 CFR Part 1306 [Docket No. [id number]] RIN [id number] "Transfer of Electronic Prescriptions for Schedules II-V Controlled Substances Between Pharmacies for Initial Filling"  ______________________________________________________________________       ______________________________________________________________________    Medication Rules  Purpose: To inform patients, and their family members, of our medication rules and regulations.  Applies to: All patients receiving prescriptions from our practice (written or electronic).  Pharmacy of record: This is the pharmacy where your electronic prescriptions will be sent. Make sure we have the correct one.  Electronic prescriptions: In compliance with the [facility] Strengthen Opioid Misuse Prevention (STOP) Act of 2017 (Session Conni Elliot [phone]), effective January 17, 2018, all controlled substances must be electronically prescribed. Written prescriptions, faxing, or calling prescriptions to a pharmacy will no longer be done.  Prescription refills: These will be provided only during in-person appointments. No medications will be renewed without a "face-to-face" evaluation with your provider. Applies to all prescriptions.  NOTE: The following applies primarily to controlled substances (Opioid* Pain Medications).   Type of encounter (visit):  For patients receiving controlled substances, face-to-face visits are required. (Not an option and not up to the patient.)  Patient's Responsibilities: Pain Pills: Bring all pain pills to every appointment (except for procedure appointments). Pill counts are required.  Pill Bottles: Bring pills in original pharmacy bottle. Bring bottle, even if empty. Always bring the bottle of the most recent fill.  Medication refills: You are responsible for knowing and keeping track of  what medications you are taking and when is it that you will need a refill. The day before your appointment: write a list of all prescriptions that need to be refilled. The day of the appointment: give the list to the admitting nurse. Prescriptions will be written only during appointments. No prescriptions will be written on procedure days. If you forget a medication: it will not be "Called in", "Faxed", or "electronically sent". You will need to get another appointment to get these prescribed. No early refills. Do not call asking to have your prescription filled early. Partial  or short prescriptions: Occasionally your pharmacy may not have enough pills to fill your prescription.  NEVER ACCEPT a partial fill or a prescription that is short of the total amount of pills that you were prescribed.  With controlled substances the law allows 72 hours for the pharmacy to complete the prescription.  If the prescription is not completed within 72 hours, the pharmacist will require a new prescription to be written. This means that you will be short on your medicine and we WILL NOT send another prescription to complete your original prescription.  Instead, request the pharmacy to send a carrier to a nearby branch to get enough medication to provide you with your full prescription. Prescription Accuracy: You are responsible for carefully inspecting your prescriptions before leaving our office. Have the discharge nurse carefully go over each prescription with you, before taking them home. Make sure that your name is accurately spelled, that your address is correct. Check the name and dose of your medication to make sure it is accurate. Check the number of pills, and the written instructions to make sure they are clear and accurate. Make sure that you are given enough medication to last until your next medication refill appointment. Taking Medication: Take medication as prescribed. When it comes to controlled substances,  taking less pills or less frequently than prescribed is permitted and encouraged. Never take more pills than instructed. Never take the medication more frequently than prescribed.  Inform other Doctors: Always inform, all of your healthcare providers, of all the medications you take. Pain Medication from other Providers: You are not allowed to accept any additional pain medication from any other Doctor or Healthcare provider. There are two exceptions to this rule. (see below) In the event that you require additional pain medication, you are responsible for notifying us, as stated below. Cough Medicine: Often these contain an opioid, such as codeine or hydrocodone. Never accept or take cough medicine containing these opioids if you are already taking an opioid* medication. The combination may cause respiratory failure and death. Medication Agreement: You are responsible for carefully reading and following our Medication Agreement. This must be signed before receiving any prescriptions from our practice. Safely store a copy of your signed Agreement. Violations to the Agreement will result in no further prescriptions. (Additional copies of our Medication Agreement are available upon request.) Laws, Rules, & Regulations: All patients are expected to follow all [address] and [employer], Rules, [place] [employer]. Ignorance of the Laws does not  constitute a valid excuse.  Illegal drugs and Controlled Substances: The use of illegal substances (including, but not limited to marijuana and its derivatives) and/or the illegal use of any controlled substances is strictly prohibited. Violation of this rule may result in the immediate and permanent discontinuation of any and all prescriptions being written by our practice. The use of any illegal substances is prohibited. Adopted CDC guidelines & recommendations: Target dosing levels will be at or below 60 MME/day. Use of benzodiazepines** is not recommended. Urine  Drug testing: Patients taking controlled substances will be required to provide a urine sample upon request. Do not void before coming to your medication management appointments. Hold emptying your bladder until you are admitted. The admitting nurse will inform you if a sample is required. Our practice reserves the right to call you at any time to provide a sample. Once receiving the call, you have 24 hours to comply with request. Not providing a sample upon request may result in termination of medication therapy.  Exceptions: There are only two exceptions to the rule of not receiving pain medications from other Healthcare Providers. Exception #1 (Emergencies): In the event of an emergency (i.e.: accident requiring emergency care), you are allowed to receive additional pain medication. However, you are responsible for: As soon as you are able, call our office [phone], at any time of the day or night, and leave a message stating your name, the date and nature of the emergency, and the name and dose of the medication prescribed. In the event that your call is answered by a member of our staff, make sure to document and save the date, time, and the name of the person that took your information.  Exception #2 (Planned Surgery): In the event that you are scheduled by another doctor or dentist to have any type of surgery or procedure, you are allowed (for a period no longer than 30 days), to receive additional pain medication, for the acute post-op pain. However, in this case, you are responsible for picking up a copy of our "Post-op Pain Management for Surgeons" handout, and giving it to your surgeon or dentist. This document is available at our office, and does not require an appointment to obtain it. Simply go to our office during business hours (Monday-Thursday from 8:00 AM to 4:00 PM) (Friday 8:00 AM to 12:00 Noon) or if you have a scheduled appointment with [place], prior to your surgery, and ask for it by  name. In addition, you are responsible for: calling our office [phone], at any time of the day or night, and leaving a message stating your name, name of your surgeon, type of surgery, and date of procedure or surgery. Failure to comply with your responsibilities may result in termination of therapy involving the controlled substances.  Consequences:  Non-compliance with the above rules may result in permanent discontinuation of medication prescription therapy. All patients receiving any type of controlled substance is expected to comply with the above patient responsibilities. Not doing so may result in permanent discontinuation of medication prescription therapy. Medication Agreement Violation. Following the above rules, including your responsibilities will help you in avoiding a Medication Agreement Violation ("Breaking your Pain Medication Contract").  *Opioid medications include: morphine, codeine, oxycodone, oxymorphone, hydrocodone, hydromorphone, meperidine, tramadol, tapentadol, buprenorphine, fentanyl, methadone. **Benzodiazepine medications include: diazepam (Valium), alprazolam (Xanax), clonazepam (Klonopine), lorazepam (Ativan), clorazepate (Tranxene), chlordiazepoxide (Librium), estazolam (Prosom), oxazepam (Serax), temazepam (Restoril), triazolam (Halcion) (Last updated: 11/09/2022) ______________________________________________________________________      ______________________________________________________________________  Medication Recommendations and Reminders  Applies to: All patients receiving prescriptions (written and/or electronic).  Medication Rules & Regulations: You are responsible for reading, knowing, and following our "Medication Rules" document. These exist for your safety and that of others. They are not flexible and neither are we. Dismissing or ignoring them is an act of "non-compliance" that may result in complete and irreversible termination of such  medication therapy. For safety reasons, "non-compliance" will not be tolerated. As with the [place] fundamental legal principle of "ignorance of the law is no defense", we will accept no excuses for not having read and knowing the content of documents provided to you by our practice.  Pharmacy of record:  Definition: This is the pharmacy where your electronic prescriptions will be sent.  We do not endorse any particular pharmacy. It is up to you and your insurance to decide what pharmacy to use.  We do not restrict you in your choice of pharmacy. However, once we write for your prescriptions, we will NOT be re-sending more prescriptions to fix restricted supply problems created by your pharmacy, or your insurance.  The pharmacy listed in the electronic medical record should be the one where you want electronic prescriptions to be sent. If you choose to change pharmacy, simply notify our nursing staff. Changes will be made only during your regular appointments and not over the phone.  Recommendations: Keep all of your pain medications in a safe place, under lock and key, even if you live alone. We will NOT replace lost, stolen, or damaged medication. We do not accept "Police Reports" as proof of medications having been stolen. After you fill your prescription, take 1 week's worth of pills and put them away in a safe place. You should keep a separate, properly labeled bottle for this purpose. The remainder should be kept in the original bottle. Use this as your primary supply, until it runs out. Once it's gone, then you know that you have 1 week's worth of medicine, and it is time to come in for a prescription refill. If you do this correctly, it is unlikely that you will ever run out of medicine. To make sure that the above recommendation works, it is very important that you make sure your medication refill appointments are scheduled at least 1 week before you run out of medicine. To do this in an effective  manner, make sure that you do not leave the office without scheduling your next medication management appointment. Always ask the nursing staff to show you in your prescription , when your medication will be running out. Then arrange for the receptionist to get you a return appointment, at least 7 days before you run out of medicine. Do not wait until you have 1 or 2 pills left, to come in. This is very poor planning and does not take into consideration that we may need to cancel appointments due to bad weather, sickness, or emergencies affecting our staff. DO NOT ACCEPT A "Partial Fill": If for any reason your pharmacy does not have enough pills/tablets to completely fill or refill your prescription, do not allow for a "partial fill". The law allows the pharmacy to complete that prescription within 72 hours, without requiring a new prescription. If they do not fill the rest of your prescription within those 72 hours, you will need a separate prescription to fill the remaining amount, which we will NOT provide. If the reason for the partial fill is your insurance, you will need to talk  to the pharmacist about payment alternatives for the remaining tablets, but again, DO NOT ACCEPT A PARTIAL FILL, unless you can trust your pharmacist to obtain the remainder of the pills within 72 hours.  Prescription refills and/or changes in medication(s):  Prescription refills, and/or changes in dose or medication, will be conducted only during scheduled medication management appointments. (Applies to both, written and electronic prescriptions.) No refills on procedure days. No medication will be changed or started on procedure days. No changes, adjustments, and/or refills will be conducted on a procedure day. Doing so will interfere with the diagnostic portion of the procedure. No phone refills. No medications will be "called into the pharmacy". No Fax refills. No weekend refills. No Holliday refills. No after hours  refills.  Remember:  Business hours are:  Monday to Thursday 8:00 AM to 4:00 PM Provider's Schedule: Delano Metz, MD - Appointments are:  Medication management: Monday and Wednesday 8:00 AM to 4:00 PM Procedure day: Tuesday and Thursday 7:30 AM to 4:00 PM Edward Jolly, MD - Appointments are:  Medication management: Tuesday and Thursday 8:00 AM to 4:00 PM Procedure day: Monday and Wednesday 7:30 AM to 4:00 PM (Last update: 11/09/2021) ______________________________________________________________________      ______________________________________________________________________     Naloxone Nasal Spray  Why am I receiving this medication? [place] STOP ACT requires that all patients taking high dose opioids or at risk of opioids respiratory depression, be prescribed an opioid reversal agent, such as Naloxone (AKA: Narcan).  What is this medication? NALOXONE (nal OX one) treats opioid overdose, which causes slow or shallow breathing, severe drowsiness, or trouble staying awake. Call emergency services after using this medication. You may need additional treatment. Naloxone works by reversing the effects of opioids. It belongs to a group of medications called opioid blockers.  COMMON BRAND NAME(S): Kloxxado, Narcan  What should I tell my care team before I take this medication? They need to know if you have any of these conditions: Heart disease Substance use disorder An unusual or allergic reaction to naloxone, other medications, foods, dyes, or preservatives Pregnant or trying to get pregnant Breast-feeding  When to use this medication? This medication is to be used for the treatment of respiratory depression (less than 8 breaths per minute) secondary to opioid overdose.   How to use this medication? This medication is for use in the nose. Lay the person on their back. Support their neck with your hand and allow the head to tilt back before giving the medication.  The nasal spray should be given into 1 nostril. After giving the medication, move the person onto their side. Do not remove or test the nasal spray until ready to use. Get emergency medical help right away after giving the first dose of this medication, even if the person wakes up. You should be familiar with how to recognize the signs and symptoms of a narcotic overdose. If more doses are needed, give the additional dose in the other nostril. Talk to your care team about the use of this medication in children. While this medication may be prescribed for children as young as newborns for selected conditions, precautions do apply.  Naloxone Overdosage: If you think you have taken too much of this medicine contact a poison control center or emergency room at once.  NOTE: This medicine is only for you. Do not share this medicine with others.  What if I miss a dose? This does not apply.  What may interact with this medication? This is  only used during an emergency. No interactions are expected during emergency use. This list may not describe all possible interactions. Give your health care provider a list of all the medicines, herbs, non-prescription drugs, or dietary supplements you use. Also tell them if you smoke, drink alcohol, or use illegal drugs. Some items may interact with your medicine.  What should I watch for while using this medication? Keep this medication ready for use in the case of an opioid overdose. Make sure that you have the phone number of your care team and local hospital ready. You may need to have additional doses of this medication. Each nasal spray contains a single dose. Some emergencies may require additional doses. After use, bring the treated person to the nearest hospital or call 911. Make sure the treating care team knows that the person has received a dose of this medication. You will receive additional instructions on what to do during and after use of this medication  before an emergency occurs.  What side effects may I notice from receiving this medication? Side effects that you should report to your care team as soon as possible: Allergic reactions--skin rash, itching, hives, swelling of the face, lips, tongue, or throat Side effects that usually do not require medical attention (report these to your care team if they continue or are bothersome): Constipation Dryness or irritation inside the nose Headache Increase in blood pressure Muscle spasms Stuffy nose Toothache This list may not describe all possible side effects. Call your doctor for medical advice about side effects. You may report side effects to FDA at 1-800-FDA-1088.  Where should I keep my medication? Because this is an emergency medication, you should keep it with you at all times.  Keep out of the reach of children and pets. Store between 20 and 25 degrees C (68 and 77 degrees F). Do not freeze. Throw away any unused medication after the expiration date. Keep in original box until ready to use.  NOTE: This sheet is a summary. It may not cover all possible information. If you have questions about this medicine, talk to your doctor, pharmacist, or health care provider.   2023 Elsevier/Gold Standard (2020-09-11 00:00:00)  ______________________________________________________________________

## 2022-11-16 NOTE — Progress Notes (Signed)
Nursing Pain Medication Assessment:  Safety precautions to be maintained throughout the outpatient stay will include: orient to surroundings, keep bed in low position, maintain call bell within reach at all times, provide assistance with transfer out of bed and ambulation.  Medication Inspection Compliance: James [name] did not comply with our request to bring his pills to be counted. He was reminded that bringing the medication bottles, even when empty, is a requirement.  Medication: None brought in. Pill/Patch Count: None available to be counted. Bottle Appearance: No container available. Did not bring bottle(s) to appointment. Filled Date: N/A Last Medication intake:  Today   Spoke with Lurena Joiner, pharmacist at [facility] [address] and she states he still has 1 active Oxycodone 10mg . Per Dr. Laban Emperor informed the pharmacist to inactivate that prescription as he will be prescribing new ones today.

## 2022-11-28 LAB — C-REACTIVE PROTEIN: CRP: 17 mg/L — ABNORMAL HIGH (ref 0–10)

## 2022-11-28 LAB — SEDIMENTATION RATE: Sed Rate: 28 mm/h (ref 0–30)

## 2022-11-28 LAB — 25-HYDROXY VITAMIN D LCMS D2+D3
25-Hydroxy, Vitamin D-2: 6.5 ng/mL
25-Hydroxy, Vitamin D-3: 17 ng/mL
25-Hydroxy, Vitamin D: 24 ng/mL — ABNORMAL LOW

## 2022-11-28 LAB — VITAMIN B12: Vitamin B-12: 251 pg/mL (ref 232–1245)

## 2022-12-22 ENCOUNTER — Ambulatory Visit: Payer: 59 | Admitting: Podiatry

## 2022-12-27 ENCOUNTER — Ambulatory Visit: Payer: 59 | Admitting: Podiatry

## 2022-12-28 ENCOUNTER — Ambulatory Visit (INDEPENDENT_AMBULATORY_CARE_PROVIDER_SITE_OTHER): Payer: 59 | Admitting: Podiatry

## 2022-12-28 ENCOUNTER — Encounter: Payer: Self-pay | Admitting: Podiatry

## 2022-12-28 VITALS — Ht 78.0 in | Wt 339.0 lb

## 2022-12-28 DIAGNOSIS — E1142 Type 2 diabetes mellitus with diabetic polyneuropathy: Secondary | ICD-10-CM | POA: Diagnosis not present

## 2022-12-28 DIAGNOSIS — M79674 Pain in right toe(s): Secondary | ICD-10-CM

## 2022-12-28 DIAGNOSIS — M79675 Pain in left toe(s): Secondary | ICD-10-CM | POA: Diagnosis not present

## 2022-12-28 DIAGNOSIS — B351 Tinea unguium: Secondary | ICD-10-CM | POA: Diagnosis not present

## 2023-01-02 ENCOUNTER — Encounter: Payer: Self-pay | Admitting: Podiatry

## 2023-01-02 NOTE — Progress Notes (Signed)
  Subjective:  Patient ID: James [name], male    DOB: 1960/05/21,  MRN: [id number]  63 y.o. male presents to clinic today with at risk foot care with history of diabetic neuropathy and painful thick toenails that are difficult to trim. Pain interferes with ambulation. Aggravating factors include wearing enclosed shoe gear. Pain is relieved with periodic professional debridement. Chief Complaint  Patient presents with   Nail Problem    Pt is here for [facility], last A1C was 6, PCP is D Karamalegos, and LOV was in July.   Patient resides at: [facility]. He arrives via EMS on a stretcher.  New pedal problem(s): None   PCP is Smitty Cords, DO.  No Known Allergies  Review of Systems: Negative except as noted in the HPI.   Objective:  James [name] is a pleasant 62 y.o. male morbidly obese in NAD.Marland Kitchen  AAO x 3.  Vascular Examination: Vascular status intact b/l with palpable pedal pulses. CFT immediate b/l. No edema. No pain with calf compression b/l. Skin temperature gradient WNL b/l.   Neurological Examination: Protective sensation diminished with 10g monofilament b/l.  Dermatological Examination: Pedal skin with normal turgor, texture and tone b/l. Toenails 1-5 b/l thick, discolored, elongated with subungual debris and pain on dorsal palpation. No hyperkeratotic lesions noted b/l.   Musculoskeletal Examination: Muscle strength 5/5 to b/l LE. No pain, crepitus or joint limitation noted with ROM bilateral LE. No gross bony deformities bilaterally.  Radiographs: None  Last A1c:      Latest Ref Rng & Units 09/24/2022    6:08 AM 06/20/2022    4:01 AM 02/15/2022   10:38 AM  Hemoglobin A1C  Hemoglobin-A1c 4.8 - 5.6 % 7.0  12.9  6.0      Assessment:   1. Pain due to onychomycosis of toenails of both feet   2. Diabetic polyneuropathy associated with type 2 diabetes mellitus (HCC)    Plan:  -Consent given for treatment as described below: -Examined patient. -Facility to  continue fall precautions and pressure precautions. -Continue foot and shoe inspections daily. Monitor blood glucose per PCP/Endocrinologist's recommendations. -Toenails 1-5 b/l were debrided in length and girth with sterile nail nippers and dremel without iatrogenic bleeding.  -Patient/POA to call should there be question/concern in the interim.  Return in about 3 months (around 03/28/2023).  Freddie Breech, DPM      [facility] LOCATION: [address] [place] [address]                   Office [phone]   [facility] LOCATION: [address] [place], [place] [address] Office [phone]

## 2023-01-03 ENCOUNTER — Ambulatory Visit: Payer: 59 | Admitting: Podiatry

## 2023-01-09 ENCOUNTER — Ambulatory Visit (HOSPITAL_BASED_OUTPATIENT_CLINIC_OR_DEPARTMENT_OTHER): Payer: 59 | Admitting: Pain Medicine

## 2023-01-09 DIAGNOSIS — G8929 Other chronic pain: Secondary | ICD-10-CM

## 2023-01-09 DIAGNOSIS — Z91199 Patient's noncompliance with other medical treatment and regimen due to unspecified reason: Secondary | ICD-10-CM

## 2023-01-09 DIAGNOSIS — Z79899 Other long term (current) drug therapy: Secondary | ICD-10-CM

## 2023-01-09 NOTE — Patient Instructions (Signed)
______________________________________________________________________    Patient information on: Body mass index (BMI) and Weight Management  Dear Mr. Grande you are receiving this information because your weight may be adversely affecting your health.   Your current Estimated body mass index is 39.18 kg/m as calculated from the following:   Height as of 12/28/22: 6\' 6"  (1.981 m).   Weight as of 12/28/22: 339 lb (153.8 kg).  We recommend you talk to your primary care physician about providing or referring you to a supervised weight management program.  Here is some information about weight and the body mass index (BMI) classification:  BMI is a measure of obesity that's calculated by dividing a person's weight in kilograms by their height in meters squared. A person can use an online calculator to determine their BMI. Body mass index (BMI) is a common tool for deciding whether a person has an appropriate body weight.  It measures a person's weight in relation to their height.  According to the [employer] of health (NIH): A BMI of less than 18.5 means that a person is underweight. A BMI of between 18.5 and 24.9 is ideal. A BMI of between 25 and 29.9 is overweight. A BMI over 30 indicates obesity.  Body Mass Index (BMI) Classification BMI level (kg/m2) Category Associated incidence of chronic pain  <18  Underweight   18.5-24.9 Ideal body weight   25-29.9 Overweight  20%  30-34.9 Obese (Class I)  68%  35-39.9 Severe obesity (Class II)  136%  >40 Extreme obesity (Class III)  254%    Morbidly Obese Classification: You will be considered to be "Morbidly Obese" if your BMI is above 30 and you have one or more of the following conditions caused or associated to obesity: 1.    Type 2 Diabetes (Leading to cardiovascular diseases (CVD), stroke, peripheral vascular diseases (PVD), retinopathy, nephropathy, and neuropathy) 2.    Cardiovascular Disease (High Blood Pressure;  Congestive Heart Failure; High Cholesterol; Coronary Artery Disease; Angina; Arrhythmias, Dysrhythmias, or Heart Attacks) 3.    Breathing problems (Asthma; obesity-hypoventilation syndrome; obstructive sleep apnea; chronic inflammatory airway disease; reactive airway disease; or shortness of breath) 4.    Chronic kidney disease 5.    Liver disease (nonalcoholic fatty liver disease) 6.    High blood pressure 7.    Acid reflux (gastroesophageal reflux disease; heartburn) 8.    Osteoarthritis (OA) (affecting the hip(s), the knee(s) and/or the lower back) (usually requiring knee and/or hip replacements, as well as back surgeries) 9.    Low back pain (Lumbar Facet Syndrome; and/or Degenerative Disc Disease) 10.  Hip pain (Osteoarthritis of hip) (For every 1 lbs of added body weight, there is a 2 lbs increase in pressure inside of each hip articulation. 1:2 mechanical relationship) 11.  Knee pain (Osteoarthritis of knee) (For every 1 lbs of added body weight, there is a 4 lbs increase in pressure inside of each knee articulation. 1:4 mechanical relationship) (patients with a BMI>30 kg/m2 were 6.8 times more likely to develop knee OA than normal-weight individuals) 12.  Cancer: Epidemiological studies have shown that obesity is a risk factor for: post-menopausal breast cancer; cancers of the endometrium, colon and kidney cancer; malignant adenomas of the esophagus. Obese subjects have an approximately 1.5-3.5-fold increased risk of developing these cancers compared with normal-weight subjects, and it has been estimated that between 15 and 45% of these cancers can be attributed to overweight. More recent studies suggest that obesity may also increase the risk of other types of cancer,  including pancreatic, hepatic and gallbladder cancer. (Ref: Obesity and cancer. Pischon T, Nthlings U, [employer] H. Proc Nutr Soc. 2008 May;67(2):128-45. doi: 10.1017/[id number].) The International Agency for Research on Cancer  (IARC) has identified 13 cancers associated with overweight and obesity: meningioma, multiple myeloma, adenocarcinoma of the esophagus, and cancers of the thyroid, postmenopausal breast cancer, gallbladder, stomach, liver, pancreas, kidney, ovaries, uterus, colon and rectal (colorectal) cancers. 55 percent of all cancers diagnosed in women and 24 percent of those diagnosed in men are associated with overweight and obesity.  Recommendation: If you have any of the above conditions it is urgent that you take a step back and concentrate in losing weight. Dedicate 100% of your efforts on this task. Nothing else will improve your health more than bringing your weight down and your BMI to less than 30.   Nutritionist and/or supervised weight-management program: We are aware that most chronic pain patients are unable to exercise secondary to their pain. For this reason, you must rely on proper nutrition and diet in order to lose the weight. We recommend you talk to a nutritionist.   Bariatric surgery: A person might be considered a candidate for bariatric surgery if they meet one of the following BMI criteria:  BMI of 40 or higher: This is considered extreme obesity (Class III). BMI of 35-39.9: This is considered obesity, and the person might also have a serious weight-related health condition, such as high blood pressure, type 2 diabetes, or severe sleep apnea  BMI of 30-34.9: This might be considered if the person has serious weight-related health problems and hasn't had substantial weight loss or improvement in co-morbidities through other methods   On your own: A realistic goal is to lose 10% of your body weight over a period of 12 months.  If over a period of six (6) months you have unsuccessfully tried to lose weight, then it is time for you to seek professional help and to enter a medically supervised weight management program, and/or undergo bariatric surgery.   Pain management considerations and  possible limitations:  1.    Pharmacological Problems: Be advised that the use of opioid analgesics (oxycodone; hydrocodone; morphine; methadone; codeine; and all of their derivatives) have been associated with decreased metabolism and weight gain.  For this reason, should we see that you are unable to lose weight while taking these medications, it may become necessary for [place] to taper down and indefinitely discontinue them.  2.    Technical Problems: The incidence of successful interventional therapies decreases as the patient's BMI increases. It is much more difficult to accomplish a safe and effective interventional therapy on a patient with a BMI above 35. 3.    Radiation Exposure Problems: The x-rays machine, used to accomplish injection therapies, will automatically increase their x-ray output in order to capture an appropriate bone image. This means that radiation exposure increases exponentially with the patient's BMI. (The higher the BMI, the higher the radiation exposure.) Although the level of radiation used at a given time is still safe to the patient, it is not for the physician and/or assisting staff. Unfortunately, radiation exposure is accumulative. Because physicians and the staff have to do procedures and be exposed on a daily basis, this can result in health problems such as cancer and radiation burns. Radiation exposure to the staff is monitored by the radiation batches that they wear. The exposure levels are reported back to the staff on a quarterly basis. Depending on levels of exposure, physicians and staff  may be obligated by law to decrease this exposure. This means that they have the right and obligation to refuse providing therapies where they may be overexposed to radiation. For this reason, physicians may decline to offer therapies such as radiofrequency ablation or implants to patients with a BMI above 40. 4.    Current Trends: Be advised that the current trend is to no longer offer  certain therapies to patients with a BMI equal to, or above 35, due to increase perioperative risks, increased technical procedural difficulties, and excessive radiation exposure to healthcare personnel.  Last updated: 10/10/2022 ______________________________________________________________________     ______________________________________________________________________    Opioid Pain Medication Update  To: All patients taking opioid pain medications. (I.e.: hydrocodone, hydromorphone, oxycodone, oxymorphone, morphine, codeine, methadone, tapentadol, tramadol, buprenorphine, fentanyl, etc.)  Re: Updated review of side effects and adverse reactions of opioid analgesics, as well as new information about long term effects of this class of medications.  Direct risks of long-term opioid therapy are not limited to opioid addiction and overdose. Potential medical risks include serious fractures, breathing problems during sleep, hyperalgesia, immunosuppression, chronic constipation, bowel obstruction, myocardial infarction, and tooth decay secondary to xerostomia.  Unpredictable adverse effects that can occur even if you take your medication correctly: Cognitive impairment, respiratory depression, and death. Most people think that if they take their medication "correctly", and "as instructed", that they will be safe. Nothing could be farther from the truth. In reality, a significant amount of recorded deaths associated with the use of opioids has occurred in individuals that had taken the medication for a long time, and were taking their medication correctly. The following are examples of how this can happen: Patient taking his/her medication for a long time, as instructed, without any side effects, is given a certain antibiotic or another unrelated medication, which in turn triggers a "Drug-to-drug interaction" leading to disorientation, cognitive impairment, impaired reflexes, respiratory depression or  an untoward event leading to serious bodily harm or injury, including death.  Patient taking his/her medication for a long time, as instructed, without any side effects, develops an acute impairment of liver and/or kidney function. This will lead to a rapid inability of the body to breakdown and eliminate their pain medication, which will result in effects similar to an "overdose", but with the same medicine and dose that they had always taken. This again may lead to disorientation, cognitive impairment, impaired reflexes, respiratory depression or an untoward event leading to serious bodily harm or injury, including death.  A similar problem will occur with patients as they grow older and their liver and kidney function begins to decrease as part of the aging process.  Background information: Historically, the original case for using long-term opioid therapy to treat chronic noncancer pain was based on safety assumptions that subsequent experience has called into question. In 1996, the American Pain Society and the American Academy of Pain Medicine issued a consensus statement supporting long-term opioid therapy. This statement acknowledged the dangers of opioid prescribing but concluded that the risk for addiction was low; respiratory depression induced by opioids was short-lived, occurred mainly in opioid-naive patients, and was antagonized by pain; tolerance was not a common problem; and efforts to control diversion should not constrain opioid prescribing. This has now proven to be wrong. Experience regarding the risks for opioid addiction, misuse, and overdose in community practice has failed to support these assumptions.  According to the Centers for Disease Control and Prevention, fatal overdoses involving opioid analgesics have increased sharply over  the past decade. Currently, more than 96,700 people die from drug overdoses every year. Opioids are a factor in 7 out of every 10 overdose deaths. Deaths  from drug overdose have surpassed motor vehicle accidents as the leading cause of death for individuals between the ages of 32 and 55.  Clinical data suggest that neuroendocrine dysfunction may be very common in both men and women, potentially causing hypogonadism, erectile dysfunction, infertility, decreased libido, osteoporosis, and depression. Recent studies linked higher opioid dose to increased opioid-related mortality. Controlled observational studies reported that long-term opioid therapy may be associated with increased risk for cardiovascular events. Subsequent meta-analysis concluded that the safety of long-term opioid therapy in elderly patients has not been proven.   Side Effects and adverse reactions: Common side effects: Drowsiness (sedation). Dizziness. Nausea and vomiting. Constipation. Physical dependence -- Dependence often manifests with withdrawal symptoms when opioids are discontinued or decreased. Tolerance -- As you take repeated doses of opioids, you require increased medication to experience the same effect of pain relief. Respiratory depression -- This can occur in healthy people, especially with higher doses. However, people with COPD, asthma or other lung conditions may be even more susceptible to fatal respiratory impairment.  Uncommon side effects: An increased sensitivity to feeling pain and extreme response to pain (hyperalgesia). Chronic use of opioids can lead to this. Delayed gastric emptying (the process by which the contents of your stomach are moved into your small intestine). Muscle rigidity. Immune system and hormonal dysfunction. Quick, involuntary muscle jerks (myoclonus). Arrhythmia. Itchy skin (pruritus). Dry mouth (xerostomia).  Long-term side effects: Chronic constipation. Sleep-disordered breathing (SDB). Increased risk of bone fractures. Hypothalamic-pituitary-adrenal dysregulation. Increased risk of overdose.  RISKS: Respiratory  depression and death: Opioids increase the risk of respiratory depression and death.  Drug-to-drug interactions: Opioids are relatively contraindicated in combination with benzodiazepines, sleep inducers, and other central nervous system depressants. Other classes of medications (i.e.: certain antibiotics and even over-the-counter medications) may also trigger or induce respiratory depression in some patients.  Medical conditions: Patients with pre-existing respiratory problems are at higher risk of respiratory failure and/or depression when in combination with opioid analgesics. Opioids are relatively contraindicated in some medical conditions such as central sleep apnea.   Fractures and Falls:  Opioids increase the risk and incidence of falls. This is of particular importance in elderly patients.  Endocrine System:  Long-term administration is associated with endocrine abnormalities (endocrinopathies). (Also known as Opioid-induced Endocrinopathy) Influences on both the hypothalamic-pituitary-adrenal axis?and the hypothalamic-pituitary-gonadal axis have been demonstrated with consequent hypogonadism and adrenal insufficiency in both sexes. Hypogonadism and decreased levels of dehydroepiandrosterone sulfate have been reported in men and women. Endocrine effects include: Amenorrhoea in women (abnormal absence of menstruation) Reduced libido in both sexes Decreased sexual function Erectile dysfunction in men Hypogonadisms (decreased testicular function with shrinkage of testicles) Infertility Depression and fatigue Loss of muscle mass Anxiety Depression Immune suppression Hyperalgesia Weight gain Anemia Osteoporosis Patients (particularly women of childbearing age) should avoid opioids. There is insufficient evidence to recommend routine monitoring of asymptomatic patients taking opioids in the long-term for hormonal deficiencies.  Immune System: Human studies have demonstrated that  opioids have an immunomodulating effect. These effects are mediated via opioid receptors both on immune effector cells and in the central nervous system. Opioids have been demonstrated to have adverse effects on antimicrobial response and anti-tumour surveillance. Buprenorphine has been demonstrated to have no impact on immune function.  Opioid Induced Hyperalgesia: Human studies have demonstrated that prolonged use of opioids can lead  to a state of abnormal pain sensitivity, sometimes called opioid induced hyperalgesia (OIH). Opioid induced hyperalgesia is not usually seen in the absence of tolerance to opioid analgesia. Clinically, hyperalgesia may be diagnosed if the patient on long-term opioid therapy presents with increased pain. This might be qualitatively and anatomically distinct from pain related to disease progression or to breakthrough pain resulting from development of opioid tolerance. Pain associated with hyperalgesia tends to be more diffuse than the pre-existing pain and less defined in quality. Management of opioid induced hyperalgesia requires opioid dose reduction.  Cancer: Chronic opioid therapy has been associated with an increased risk of cancer among noncancer patients with chronic pain. This association was more evident in chronic strong opioid users. Chronic opioid consumption causes significant pathological changes in the small intestine and colon. Epidemiological studies have found that there is a link between opium dependence and initiation of gastrointestinal cancers. Cancer is the second leading cause of death after cardiovascular disease. Chronic use of opioids can cause multiple conditions such as GERD, immunosuppression and renal damage as well as carcinogenic effects, which are associated with the incidence of cancers.   Mortality: Long-term opioid use has been associated with increased mortality among patients with chronic non-cancer pain (CNCP).  Prescription of  long-acting opioids for chronic noncancer pain was associated with a significantly increased risk of all-cause mortality, including deaths from causes other than overdose.  Reference: Von Korff M, Kolodny A, Deyo RA, Chou R. Long-term opioid therapy reconsidered. Ann Intern Med. 2011 Sep 6;155(5):325-8. doi: 10.7326/[id number]. PMID: 95621308; PMCID: [id number]. Randon Goldsmith, Hayward RA, Dunn KM, [place] KP. Risk of adverse events in patients prescribed long-term opioids: A cohort study in the [place] [facility] Research D[employer]. Eur J Pain. 2019 May;23(5):908-922. doi: 10.1002/ejp.1357. Epub 2019 Jan 31. PMID: 52841324. Colameco S, Coren JS, Ciervo CA. Continuous opioid treatment for chronic noncancer pain: a time for moderation in prescribing. Postgrad Med. 2009 Jul;121(4):61-6. doi: 10.3810/pgm.2009.07.2032. PMID: 40102725. William Hamburger RN, [place], Blazina I, Cristopher [place], Bougatsos C, Deyo RA. The effectiveness and risks of long-term opioid therapy for chronic pain: a systematic review for a [employer] of [employer] Pathways to [employer]. Ann Intern Med. 2015 Feb 17;162(4):276-86. doi: 10.7326/M14-2559. PMID: 36644034. Caryl Bis [facility], [place] DM. NCHS Data Brief No. 22. [place]: Centers for Disease Control and Prevention; 2009. Sep, Increase in Fatal Poisonings Involving Opioid Analgesics in the [place], 1999-2006. Song IA, Choi HR, Oh TK. Long-term opioid use and mortality in patients with chronic non-cancer pain: Ten-year follow-up study in [place] from 2010 through 2019. EClinicalMedicine. 2022 Jul 18;51:101558. doi: 10.1016/j.eclinm.2022.742595. PMID: 63875643; PMCID: [id number]. Huser, W., Schubert, T., Vogelmann, T. et al. All-cause mortality in patients with long-term opioid therapy compared with non-opioid analgesics for chronic non-cancer pain: a database study. BMC Med 18, 162 (2020).  http://lester.info/ Rashidian H, Karie Kirks, Malekzadeh R, Haghdoost AA. An Ecological Study of the Association between Opiate Use and Incidence of Cancers. Addict Health. 2016 Fall;8(4):252-260. PMID: 60630160; PMCID: [id number].  Our Goal: Our goal is to control your pain with means other than the use of opioid pain medications.  Our Recommendation: Talk to your physician about coming off of these medications. We can assist you with the tapering down and stopping these medicines. Based on the new information, even if you cannot completely stop the medication, a decrease in the dose may be associated with a lesser risk. Ask for other means of controlling the  pain. Decrease or eliminate those factors that significantly contribute to your pain such as smoking, obesity, and a diet heavily tilted towards "inflammatory" nutrients.  Last Updated: 07/25/2022   ______________________________________________________________________       ______________________________________________________________________    National Pain Medication Shortage  The [place] is experiencing worsening drug shortages. These have had a negative widespread effect on patient care and treatment. Not expected to improve any time soon. Predicted to last past 2029.   Drug shortage list (generic names) Oxycodone IR Oxycodone/APAP Oxymorphone IR Hydromorphone Hydrocodone/APAP Morphine  Where is the problem?  Manufacturing and supply level.  Will this shortage affect you?  Only if you take any of the above pain medications.  How? You may be unable to fill your prescription.  Your pharmacist may offer a "partial fill" of your prescription. (Warning: Do not accept partial fills.) Prescriptions partially filled cannot be transferred to another pharmacy. Read our Medication Rules and Regulation. Depending on how much medicine you are dependent on, you may experience withdrawals when  unable to get the medication.  Recommendations: Consider ending your dependence on opioid pain medications. Ask your pain specialist to assist you with the process. Consider switching to a medication currently not in shortage, such as Buprenorphine. Talk to your pain specialist about this option. Consider decreasing your pain medication requirements by managing tolerance thru "Drug Holidays". This may help minimize withdrawals, should you run out of medicine. Control your pain thru the use of non-pharmacological interventional therapies.   Your prescriber: Prescribers cannot be blamed for shortages. Medication manufacturing and supply issues cannot be fixed by the prescriber.   NOTE: The prescriber is not responsible for supplying the medication, or solving supply issues. Work with your pharmacist to solve it. The patient is responsible for the decision to take or continue taking the medication and for identifying and securing a legal supply source. By law, supplying the medication is the job and responsibility of the pharmacy. The prescriber is responsible for the evaluation, monitoring, and prescribing of these medications.   Prescribers will NOT: Re-issue prescriptions that have been partially filled. Re-issue prescriptions already sent to a pharmacy.  Re-send prescriptions to a different pharmacy because yours did not have your medication. Ask pharmacist to order more medicine or transfer the prescription to another pharmacy. (Read below.)  New 2023 regulation: "September 17, 2021 Revised Regulation Allows DEA-Registered Pharmacies to Transfer Electronic Prescriptions at a Patient's Request DEA Headquarters Division - Public Information Office Patients now have the ability to request their electronic prescription be transferred to another pharmacy without having to go back to their practitioner to initiate the request. This revised regulation went into effect on Monday, September 13, 2021.      At a patient's request, a DEA-registered retail pharmacy can now transfer an electronic prescription for a controlled substance (schedules II-V) to another DEA-registered retail pharmacy. Prior to this change, patients would have to go through their practitioner to cancel their prescription and have it re-issued to a different pharmacy. The process was taxing and time consuming for both patients and practitioners.    The Drug Enforcement Administration [facility]) published its intent to revise the process for transferring electronic prescriptions on December 06, 2019.  The final rule was published in the federal register on August 12, 2021 and went into effect 30 days later.  Under the final rule, a prescription can only be transferred once between pharmacies, and only if allowed under existing state or other applicable law. The prescription must remain in  its electronic form; may not be altered in any way; and the transfer must be communicated directly between two licensed pharmacists. It's important to note, any authorized refills transfer with the original prescription, which means the entire prescription will be filled at the same pharmacy".  Reference: [email or web address] [facility] website announcement)  [email or web address] Financial pl[employer] of [employer])   [employer] / Vol. 88, No. 143 / Thursday, August 12, 2021 / Rules and Regulations DEPARTMENT OF JUSTICE  Drug Enforcement Administration  21 CFR Part 1306  [Docket No. [id number]]  RIN [id number] Transfer of Electronic Prescriptions for Schedules II-V Controlled Substances Between Pharmacies for Initial Filling  ______________________________________________________________________       ______________________________________________________________________    Transfer of Pain Medication  between Pharmacies  Re: 2023 DEA Clarification on existing regulation  Published on DEA Website: September 17, 2021  Title: Revised Regulation Allows DEA-Registered Pharmacies to Electrical engineer Prescriptions at a Patient's Request DEA Headquarters Division - [employer]  "Patients now have the ability to request their electronic prescription be transferred to another pharmacy without having to go back to their practitioner to initiate the request. This revised regulation went into effect on Monday, September 13, 2021.     At a patient's request, a DEA-registered retail pharmacy can now transfer an electronic prescription for a controlled substance (schedules II-V) to another DEA-registered retail pharmacy. Prior to this change, patients would have to go through their practitioner to cancel their prescription and have it re-issued to a different pharmacy. The process was taxing and time consuming for both patients and practitioners.    The Drug Enforcement Administration [facility]) published its intent to revise the process for transferring electronic prescriptions on December 06, 2019.  The final rule was published in the federal register on August 12, 2021 and went into effect 30 days later.  Under the final rule, a prescription can only be transferred once between pharmacies, and only if allowed under existing state or other applicable law. The prescription must remain in its electronic form; may not be altered in any way; and the transfer must be communicated directly between two licensed pharmacists. It's important to note, any authorized refills transfer with the original prescription, which means the entire prescription will be filled at the same pharmacy."    REFERENCES: 1. DEA website announcement [email or web address]  2. Department of Justice website   [email or web address]  3. DEPARTMENT OF JUSTICE Drug Enforcement Administration 21 CFR Part 1306 [Docket No. [id number]] RIN [id number] "Transfer of Electronic Prescriptions for Schedules II-V Controlled Substances Between Pharmacies for Initial Filling"  ______________________________________________________________________       ______________________________________________________________________    Medication Rules  Purpose: To inform patients, and their family members, of our medication rules and regulations.  Applies to: All patients receiving prescriptions from our practice (written or electronic).  Pharmacy of record: This is the pharmacy where your electronic prescriptions will be sent. Make sure we have the correct one.  Electronic prescriptions: In compliance with the [facility] Strengthen Opioid Misuse Prevention (STOP) Act of 2017 (Session Conni Elliot [phone]), effective January 17, 2018, all controlled substances must be electronically prescribed. Written prescriptions, faxing, or calling prescriptions to a pharmacy will no longer be done.  Prescription refills: These will be provided only during in-person appointments. No medications will be renewed without a "face-to-face" evaluation with your provider. Applies to all prescriptions.  NOTE: The following applies primarily to controlled substances (Opioid* Pain Medications).   Type of encounter (visit): For patients receiving controlled substances, face-to-face visits  are required. (Not an option and not up to the patient.)  Patient's Responsibilities: Pain Pills: Bring all pain pills to every appointment (except for procedure appointments). Pill counts are required.  Pill Bottles: Bring pills in original pharmacy bottle. Bring bottle, even if empty. Always bring the bottle of the most recent fill.  Medication refills: You are responsible for knowing and keeping track of  what medications you are taking and when is it that you will need a refill. The day before your appointment: write a list of all prescriptions that need to be refilled. The day of the appointment: give the list to the admitting nurse. Prescriptions will be written only during appointments. No prescriptions will be written on procedure days. If you forget a medication: it will not be "Called in", "Faxed", or "electronically sent". You will need to get another appointment to get these prescribed. No early refills. Do not call asking to have your prescription filled early. Partial  or short prescriptions: Occasionally your pharmacy may not have enough pills to fill your prescription.  NEVER ACCEPT a partial fill or a prescription that is short of the total amount of pills that you were prescribed.  With controlled substances the law allows 72 hours for the pharmacy to complete the prescription.  If the prescription is not completed within 72 hours, the pharmacist will require a new prescription to be written. This means that you will be short on your medicine and we WILL NOT send another prescription to complete your original prescription.  Instead, request the pharmacy to send a carrier to a nearby branch to get enough medication to provide you with your full prescription. Prescription Accuracy: You are responsible for carefully inspecting your prescriptions before leaving our office. Have the discharge nurse carefully go over each prescription with you, before taking them home. Make sure that your name is accurately spelled, that your address is correct. Check the name and dose of your medication to make sure it is accurate. Check the number of pills, and the written instructions to make sure they are clear and accurate. Make sure that you are given enough medication to last until your next medication refill appointment. Taking Medication: Take medication as prescribed. When it comes to controlled substances,  taking less pills or less frequently than prescribed is permitted and encouraged. Never take more pills than instructed. Never take the medication more frequently than prescribed.  Inform other Doctors: Always inform, all of your healthcare providers, of all the medications you take. Pain Medication from other Providers: You are not allowed to accept any additional pain medication from any other Doctor or Healthcare provider. There are two exceptions to this rule. (see below) In the event that you require additional pain medication, you are responsible for notifying us, as stated below. Cough Medicine: Often these contain an opioid, such as codeine or hydrocodone. Never accept or take cough medicine containing these opioids if you are already taking an opioid* medication. The combination may cause respiratory failure and death. Medication Agreement: You are responsible for carefully reading and following our Medication Agreement. This must be signed before receiving any prescriptions from our practice. Safely store a copy of your signed Agreement. Violations to the Agreement will result in no further prescriptions. (Additional copies of our Medication Agreement are available upon request.) Laws, Rules, & Regulations: All patients are expected to follow all [address] and [employer], Rules, [place] [employer]. Ignorance of the Laws does not constitute a valid excuse.  Illegal drugs  and Controlled Substances: The use of illegal substances (including, but not limited to marijuana and its derivatives) and/or the illegal use of any controlled substances is strictly prohibited. Violation of this rule may result in the immediate and permanent discontinuation of any and all prescriptions being written by our practice. The use of any illegal substances is prohibited. Adopted CDC guidelines & recommendations: Target dosing levels will be at or below 60 MME/day. Use of benzodiazepines** is not recommended. Urine  Drug testing: Patients taking controlled substances will be required to provide a urine sample upon request. Do not void before coming to your medication management appointments. Hold emptying your bladder until you are admitted. The admitting nurse will inform you if a sample is required. Our practice reserves the right to call you at any time to provide a sample. Once receiving the call, you have 24 hours to comply with request. Not providing a sample upon request may result in termination of medication therapy.  Exceptions: There are only two exceptions to the rule of not receiving pain medications from other Healthcare Providers. Exception #1 (Emergencies): In the event of an emergency (i.e.: accident requiring emergency care), you are allowed to receive additional pain medication. However, you are responsible for: As soon as you are able, call our office [phone], at any time of the day or night, and leave a message stating your name, the date and nature of the emergency, and the name and dose of the medication prescribed. In the event that your call is answered by a member of our staff, make sure to document and save the date, time, and the name of the person that took your information.  Exception #2 (Planned Surgery): In the event that you are scheduled by another doctor or dentist to have any type of surgery or procedure, you are allowed (for a period no longer than 30 days), to receive additional pain medication, for the acute post-op pain. However, in this case, you are responsible for picking up a copy of our "Post-op Pain Management for Surgeons" handout, and giving it to your surgeon or dentist. This document is available at our office, and does not require an appointment to obtain it. Simply go to our office during business hours (Monday-Thursday from 8:00 AM to 4:00 PM) (Friday 8:00 AM to 12:00 Noon) or if you have a scheduled appointment with [place], prior to your surgery, and ask for it by  name. In addition, you are responsible for: calling our office [phone], at any time of the day or night, and leaving a message stating your name, name of your surgeon, type of surgery, and date of procedure or surgery. Failure to comply with your responsibilities may result in termination of therapy involving the controlled substances.  Consequences:  Non-compliance with the above rules may result in permanent discontinuation of medication prescription therapy. All patients receiving any type of controlled substance is expected to comply with the above patient responsibilities. Not doing so may result in permanent discontinuation of medication prescription therapy. Medication Agreement Violation. Following the above rules, including your responsibilities will help you in avoiding a Medication Agreement Violation ("Breaking your Pain Medication Contract").  *Opioid medications include: morphine, codeine, oxycodone, oxymorphone, hydrocodone, hydromorphone, meperidine, tramadol, tapentadol, buprenorphine, fentanyl, methadone. **Benzodiazepine medications include: diazepam (Valium), alprazolam (Xanax), clonazepam (Klonopine), lorazepam (Ativan), clorazepate (Tranxene), chlordiazepoxide (Librium), estazolam (Prosom), oxazepam (Serax), temazepam (Restoril), triazolam (Halcion) (Last updated: 11/09/2022) ______________________________________________________________________      ______________________________________________________________________    Medication Recommendations and Reminders  Applies to: All patients receiving prescriptions (written and/or electronic).  Medication Rules & Regulations: You are responsible for reading, knowing, and following our "Medication Rules" document. These exist for your safety and that of others. They are not flexible and neither are we. Dismissing or ignoring them is an act of "non-compliance" that may result in complete and irreversible termination of such  medication therapy. For safety reasons, "non-compliance" will not be tolerated. As with the [place] fundamental legal principle of "ignorance of the law is no defense", we will accept no excuses for not having read and knowing the content of documents provided to you by our practice.  Pharmacy of record:  Definition: This is the pharmacy where your electronic prescriptions will be sent.  We do not endorse any particular pharmacy. It is up to you and your insurance to decide what pharmacy to use.  We do not restrict you in your choice of pharmacy. However, once we write for your prescriptions, we will NOT be re-sending more prescriptions to fix restricted supply problems created by your pharmacy, or your insurance.  The pharmacy listed in the electronic medical record should be the one where you want electronic prescriptions to be sent. If you choose to change pharmacy, simply notify our nursing staff. Changes will be made only during your regular appointments and not over the phone.  Recommendations: Keep all of your pain medications in a safe place, under lock and key, even if you live alone. We will NOT replace lost, stolen, or damaged medication. We do not accept "Police Reports" as proof of medications having been stolen. After you fill your prescription, take 1 week's worth of pills and put them away in a safe place. You should keep a separate, properly labeled bottle for this purpose. The remainder should be kept in the original bottle. Use this as your primary supply, until it runs out. Once it's gone, then you know that you have 1 week's worth of medicine, and it is time to come in for a prescription refill. If you do this correctly, it is unlikely that you will ever run out of medicine. To make sure that the above recommendation works, it is very important that you make sure your medication refill appointments are scheduled at least 1 week before you run out of medicine. To do this in an effective  manner, make sure that you do not leave the office without scheduling your next medication management appointment. Always ask the nursing staff to show you in your prescription , when your medication will be running out. Then arrange for the receptionist to get you a return appointment, at least 7 days before you run out of medicine. Do not wait until you have 1 or 2 pills left, to come in. This is very poor planning and does not take into consideration that we may need to cancel appointments due to bad weather, sickness, or emergencies affecting our staff. DO NOT ACCEPT A "Partial Fill": If for any reason your pharmacy does not have enough pills/tablets to completely fill or refill your prescription, do not allow for a "partial fill". The law allows the pharmacy to complete that prescription within 72 hours, without requiring a new prescription. If they do not fill the rest of your prescription within those 72 hours, you will need a separate prescription to fill the remaining amount, which we will NOT provide. If the reason for the partial fill is your insurance, you will need to talk to the pharmacist about payment  alternatives for the remaining tablets, but again, DO NOT ACCEPT A PARTIAL FILL, unless you can trust your pharmacist to obtain the remainder of the pills within 72 hours.  Prescription refills and/or changes in medication(s):  Prescription refills, and/or changes in dose or medication, will be conducted only during scheduled medication management appointments. (Applies to both, written and electronic prescriptions.) No refills on procedure days. No medication will be changed or started on procedure days. No changes, adjustments, and/or refills will be conducted on a procedure day. Doing so will interfere with the diagnostic portion of the procedure. No phone refills. No medications will be "called into the pharmacy". No Fax refills. No weekend refills. No Holliday refills. No after hours  refills.  Remember:  Business hours are:  Monday to Thursday 8:00 AM to 4:00 PM Provider's Schedule: Delano Metz, MD - Appointments are:  Medication management: Monday and Wednesday 8:00 AM to 4:00 PM Procedure day: Tuesday and Thursday 7:30 AM to 4:00 PM Edward Jolly, MD - Appointments are:  Medication management: Tuesday and Thursday 8:00 AM to 4:00 PM Procedure day: Monday and Wednesday 7:30 AM to 4:00 PM (Last update: 11/09/2021) ______________________________________________________________________      ______________________________________________________________________     Naloxone Nasal Spray  Why am I receiving this medication? [place] STOP ACT requires that all patients taking high dose opioids or at risk of opioids respiratory depression, be prescribed an opioid reversal agent, such as Naloxone (AKA: Narcan).  What is this medication? NALOXONE (nal OX one) treats opioid overdose, which causes slow or shallow breathing, severe drowsiness, or trouble staying awake. Call emergency services after using this medication. You may need additional treatment. Naloxone works by reversing the effects of opioids. It belongs to a group of medications called opioid blockers.  COMMON BRAND NAME(S): Kloxxado, Narcan  What should I tell my care team before I take this medication? They need to know if you have any of these conditions: Heart disease Substance use disorder An unusual or allergic reaction to naloxone, other medications, foods, dyes, or preservatives Pregnant or trying to get pregnant Breast-feeding  When to use this medication? This medication is to be used for the treatment of respiratory depression (less than 8 breaths per minute) secondary to opioid overdose.   How to use this medication? This medication is for use in the nose. Lay the person on their back. Support their neck with your hand and allow the head to tilt back before giving the medication.  The nasal spray should be given into 1 nostril. After giving the medication, move the person onto their side. Do not remove or test the nasal spray until ready to use. Get emergency medical help right away after giving the first dose of this medication, even if the person wakes up. You should be familiar with how to recognize the signs and symptoms of a narcotic overdose. If more doses are needed, give the additional dose in the other nostril. Talk to your care team about the use of this medication in children. While this medication may be prescribed for children as young as newborns for selected conditions, precautions do apply.  Naloxone Overdosage: If you think you have taken too much of this medicine contact a poison control center or emergency room at once.  NOTE: This medicine is only for you. Do not share this medicine with others.  What if I miss a dose? This does not apply.  What may interact with this medication? This is only used during an emergency.  No interactions are expected during emergency use. This list may not describe all possible interactions. Give your health care provider a list of all the medicines, herbs, non-prescription drugs, or dietary supplements you use. Also tell them if you smoke, drink alcohol, or use illegal drugs. Some items may interact with your medicine.  What should I watch for while using this medication? Keep this medication ready for use in the case of an opioid overdose. Make sure that you have the phone number of your care team and local hospital ready. You may need to have additional doses of this medication. Each nasal spray contains a single dose. Some emergencies may require additional doses. After use, bring the treated person to the nearest hospital or call 911. Make sure the treating care team knows that the person has received a dose of this medication. You will receive additional instructions on what to do during and after use of this medication  before an emergency occurs.  What side effects may I notice from receiving this medication? Side effects that you should report to your care team as soon as possible: Allergic reactions--skin rash, itching, hives, swelling of the face, lips, tongue, or throat Side effects that usually do not require medical attention (report these to your care team if they continue or are bothersome): Constipation Dryness or irritation inside the nose Headache Increase in blood pressure Muscle spasms Stuffy nose Toothache This list may not describe all possible side effects. Call your doctor for medical advice about side effects. You may report side effects to FDA at 1-800-FDA-1088.  Where should I keep my medication? Because this is an emergency medication, you should keep it with you at all times.  Keep out of the reach of children and pets. Store between 20 and 25 degrees C (68 and 77 degrees F). Do not freeze. Throw away any unused medication after the expiration date. Keep in original box until ready to use.  NOTE: This sheet is a summary. It may not cover all possible information. If you have questions about this medicine, talk to your doctor, pharmacist, or health care provider.   2023 Elsevier/Gold Standard (2020-09-11 00:00:00)  ______________________________________________________________________

## 2023-01-09 NOTE — Progress Notes (Signed)
(  01/09/2023) NO-SHOW to medication management encounter.  Has enough medications to last until 02/14/2023.

## 2023-03-31 ENCOUNTER — Ambulatory Visit: Payer: 59

## 2023-03-31 DIAGNOSIS — Z0000 Encounter for general adult medical examination without abnormal findings: Secondary | ICD-10-CM | POA: Diagnosis not present

## 2023-03-31 NOTE — Progress Notes (Signed)
 Subjective:   James [name] is a 63 y.o. who presents for a Medicare Wellness preventive visit.  Visit Complete: Virtual I connected with  Vernelle Emerald on 03/31/23 by a audio enabled telemedicine application and verified that I am speaking with the correct person using two identifiers.  Patient Location: Home  Provider Location: Office/Clinic  I discussed the limitations of evaluation and management by telemedicine. The patient expressed understanding and agreed to proceed.  Vital Signs: Because this visit was a virtual/telehealth visit, some criteria may be missing or patient reported. Any vitals not documented were not able to be obtained and vitals that have been documented are patient reported.  VideoDeclined- This patient declined Librarian, academic. Therefore the visit was completed with audio only.  Persons Participating in Visit: Patient.  AWV Questionnaire: No: Patient Medicare AWV questionnaire was not completed prior to this visit.  Cardiac Risk Factors include: advanced age (>39men, >84 women);diabetes mellitus;male gender;hypertension;dyslipidemia;sedentary lifestyle;obesity (BMI >30kg/m2)     Objective:    Today's Vitals   03/31/23 1012  PainSc: 4    There is no height or weight on file to calculate BMI.     03/31/2023   10:18 AM 11/16/2022   10:52 AM 09/23/2022    2:24 PM 08/29/2022   10:17 AM 06/18/2022    2:00 PM 05/25/2022    9:44 AM 03/25/2022   10:36 AM  Advanced Directives  Does Patient Have a Medical Advance Directive? No No No No No No No  Would patient like information on creating a medical advance directive? No - Patient declined No - Patient declined No - Patient declined No - Patient declined No - Patient declined No - Patient declined No - Patient declined    Current Medications (verified) Outpatient Encounter Medications as of 03/31/2023  Medication Sig   albuterol (VENTOLIN HFA) 108 (90 Base) MCG/ACT inhaler  INHALE 1 TO 2 PUFFS BY MOUTH EVERY 6 HOURS AS NEEDED FOR WHEEZING FOR SHORTNESS OF BREATH   atorvastatin (LIPITOR) 40 MG tablet Take 1 tablet by mouth once daily   Blood Glucose Monitoring Suppl (ONETOUCH VERIO) w/Device KIT Use to check blood sugar 1 x daily   CHANTIX 1 MG tablet Take 1 mg by mouth 2 (two) times daily.   fluticasone-salmeterol (ADVAIR DISKUS) 250-50 MCG/ACT AEPB Inhale 1 puff into the lungs in the morning and at bedtime.   furosemide (LASIX) 20 MG tablet TAKE 1 TABLET BY MOUTH ONCE DAILY AS NEEDED FOR FLUID OR EDEMA   Insulin Pen Needle (PEN NEEDLES 3/16") 31G X 5 MM MISC 1 each by Does not apply route 4 (four) times daily -  before meals and at bedtime.   lactulose (CHRONULAC) 10 GM/15ML solution Take 45 mLs (30 g total) by mouth daily as needed for mild constipation.   loratadine (CLARITIN) 10 MG tablet Take 1 tablet (10 mg total) by mouth daily.   losartan (COZAAR) 25 MG tablet Take 1 tablet by mouth once daily   naloxone (NARCAN) nasal spray 4 mg/0.1 mL Place 1 spray into the nose as needed for up to 365 doses (for opioid-induced respiratory depresssion). In case of emergency (overdose), spray once into each nostril. If no response within 3 minutes, repeat application and call 911.   OneTouch Delica Lancets 33G MISC Use to check blood sugar 1 x daily   ONETOUCH VERIO test strip Use to check blood sugar 1 x daily   OZEMPIC, 1 MG/DOSE, 4 MG/3ML SOPN INJECT 1MG  INTO THE SKIN ONCE  A WEEK   polyethylene glycol (MIRALAX / GLYCOLAX) 17 g packet Take 17 g by mouth 2 (two) times daily.   pregabalin (LYRICA) 200 MG capsule Take 1 capsule (200 mg total) by mouth 2 (two) times daily.   cyclobenzaprine (FLEXERIL) 10 MG tablet Take 1 tablet (10 mg total) by mouth 3 (three) times daily as needed for muscle spasms. (Patient not taking: Reported on 03/31/2023)   metFORMIN (GLUCOPHAGE) 1000 MG tablet TAKE 1 TABLET BY MOUTH WITH BREAKFAST (Patient not taking: Reported on 03/31/2023)   Oxycodone  HCl 10 MG TABS Take 1 tablet (10 mg total) by mouth every 8 (eight) hours as needed. Must last 30 days.   No facility-administered encounter medications on file as of 03/31/2023.    Allergies (verified) Patient has no allergy information on record.   History: Past Medical History:  Diagnosis Date   Arthritis    Diabetes mellitus without complication (HCC)    Hyperlipidemia    Hypertension    Past Surgical History:  Procedure Laterality Date   APPENDECTOMY     COLONOSCOPY WITH PROPOFOL     COLONOSCOPY WITH PROPOFOL N/A 04/23/2021   Procedure: COLONOSCOPY WITH PROPOFOL;  Surgeon: Wyline Mood, MD;  Location: [facility] ENDOSCOPY;  Service: Gastroenterology;  Laterality: N/A;   Family History  Problem Relation Age of Onset   Heart disease Mother    Arthritis Mother    Arthritis Father    Social History   Socioeconomic History   Marital status: Single    Spouse name: Not on file   Number of children: Not on file   Years of education: Not on file   Highest education level: Not on file  Occupational History   Not on file  Tobacco Use   Smoking status: Some Days    Current packs/day: 0.10    Average packs/day: 0.1 packs/day for 25.0 years (2.5 ttl pk-yrs)    Types: Cigarettes   Smokeless tobacco: Never  Vaping Use   Vaping status: Never Used  Substance and Sexual Activity   Alcohol use: Never   Drug use: Never   Sexual activity: Not on file  Other Topics Concern   Not on file  Social History Narrative   Not on file   Social Drivers of Health   Financial Resource Strain: Low Risk  (03/31/2023)   Overall Financial Resource Strain (CARDIA)    Difficulty of Paying Living Expenses: Not hard at all  Food Insecurity: No Food Insecurity (03/31/2023)   Hunger Vital Sign    Worried About Running Out of Food in the Last Year: Never true    Ran Out of Food in the Last Year: Never true  Transportation Needs: No Transportation Needs (03/31/2023)   PRAPARE - Scientist, research (physical sciences) (Medical): No    Lack of Transportation (Non-Medical): No  Physical Activity: Inactive (03/31/2023)   Exercise Vital Sign    Days of Exercise per Week: 0 days    Minutes of Exercise per Session: 0 min  Stress: No Stress Concern Present (03/31/2023)   [employer] of Occupational Health - Occupational Stress Questionnaire    Feeling of Stress : Only a little  Social Connections: Socially Isolated (03/31/2023)   Social Connection and Isolation Panel [NHANES]    Frequency of Communication with Friends and Family: More than three times a week    Frequency of Social Gatherings with Friends and Family: Once a week    Attends Religious Services: Never    Active Member  of Clubs or Organizations: No    Attends Banker Meetings: Never    Marital Status: Divorced    Tobacco Counseling Ready to quit: Not Answered Counseling given: Not Answered    Clinical Intake:  Pre-visit preparation completed: Yes  Pain : 0-10 Pain Score: 4  Pain Type: Chronic pain Pain Location: Knee Pain Orientation: Right, Mid Pain Radiating Towards: HIPS Pain Descriptors / Indicators: Aching, Constant, Throbbing Pain Onset: More than a month ago Pain Frequency: Constant Pain Relieving Factors: OXYCODONE  Pain Relieving Factors: OXYCODONE  BMI - recorded: 39.2 Nutritional Status: BMI > 30  Obese Nutritional Risks: None Diabetes: Yes CBG done?: No Did pt. bring in CBG monitor from home?: No  How often do you need to have someone help you when you read instructions, pamphlets, or other written materials from your doctor or pharmacy?: 1 - Never  Interpreter Needed?: No  Information entered by :: Kennedy Bucker, LPN   Activities of Daily Living     03/31/2023   10:19 AM 09/24/2022    2:56 AM  In your present state of health, do you have any difficulty performing the following activities:  Hearing? 0 0  Vision? 0 0  Difficulty concentrating or making decisions? 0 0   Walking or climbing stairs? 1 1  Dressing or bathing? 0 1  Doing errands, shopping? 1 1  Preparing Food and eating ? N   Using the Toilet? N   In the past six months, have you accidently leaked urine? N   Do you have problems with loss of bowel control? N   Managing your Medications? N   Managing your Finances? N   Housekeeping or managing your Housekeeping? N     Patient Care Team: Smitty Cords, DO as PCP - General (Family Medicine)  Indicate any recent Medical Services you may have received from other than [facility] providers in the past year (date may be approximate).     Assessment:   This is a routine wellness examination for Tyeson.  Hearing/Vision screen Hearing Screening - Comments:: NO AIDS Vision Screening - Comments:: READ[facility]- [facility]   Goals Addressed             This Visit's Progress    Cut out extra servings         Depression Screen     03/31/2023   10:16 AM 11/16/2022   10:51 AM 05/25/2022    9:44 AM 05/04/2022    2:34 PM 03/25/2022   10:34 AM 02/16/2022   10:58 AM 11/30/2021    9:30 AM  PHQ 2/9 Scores  PHQ - 2 Score 1 0 0 0 0 0 0  PHQ- 9 Score 1   1 0      Fall Risk     03/31/2023   10:19 AM 11/16/2022   10:51 AM 08/29/2022   10:17 AM 05/25/2022    9:36 AM 05/04/2022    2:35 PM  Fall Risk   Falls in the past year? 0 1 0 1 1  Comment  fall on 10/23/22  most recent fall April 2024   Number falls in past yr: 1 0  1 1  Injury with Fall? 0 1  1 0  Risk for fall due to : Impaired mobility;History of fall(s) History of fall(s)  History of fall(s)   Follow up Falls prevention discussed;Falls evaluation completed        MEDICARE RISK AT HOME:  Medicare Risk at Home Any stairs in or around  the home?: No If so, are there any without handrails?: No Home free of loose throw rugs in walkways, pet beds, electrical cords, etc?: Yes Adequate lighting in your home to reduce risk of falls?: Yes Life alert?: No Use of a cane, walker or w/c?: Yes  (CANE, WALKER, W/C) Grab bars in the bathroom?: Yes Shower chair or bench in shower?: Yes Elevated toilet seat or a handicapped toilet?: No  TIMED UP AND GO:  Was the test performed?  No  Cognitive Function: 6CIT completed        03/31/2023   10:21 AM 03/25/2022   10:43 AM  6CIT Screen  What Year? 0 points 0 points  What month? 0 points 0 points  What time? 0 points 0 points  Count back from 20 0 points 0 points  Months in reverse 0 points 4 points  Repeat phrase 2 points 2 points  Total Score 2 points 6 points    Immunizations Immunization History  Administered Date(s) Administered   Influenza, Seasonal, Injecte, Preservative Fre 09/29/2022   Influenza,inj,Quad PF,6+ Mos 11/09/2020, 11/12/2021   Influenza,inj,quad, With Preservative 10/19/2016   Moderna Covid-19 Vaccine Bivalent Booster 76yrs & up 03/27/2020   PFIZER Comirnaty(Gray Top)Covid-19 Tri-Sucrose Vaccine 06/06/2019   PFIZER(Purple Top)SARS-COV-2 Vaccination 05/16/2019   Zoster Recombinant(Shingrix) 11/12/2021    Screening Tests Health Maintenance  Topic Date Due   Pneumococcal Vaccine 71-43 Years old (1 of 2 - PCV) Never done   Diabetic kidney evaluation - Urine ACR  Never done   Hepatitis C Screening  Never done   DTaP/Tdap/Td (1 - Tdap) Never done   Zoster Vaccines- Shingrix (2 of 2) 01/07/2022   OPHTHALMOLOGY EXAM  07/30/2022   COVID-19 Vaccine (4 - 2024-25 season) 09/18/2022   FOOT EXAM  02/16/2023   HEMOGLOBIN A1C  03/24/2023   Diabetic kidney evaluation - eGFR measurement  09/25/2023   Medicare Annual Wellness (AWV)  03/30/2024   Colonoscopy  04/23/2024   INFLUENZA VACCINE  Completed   HIV Screening  Completed   HPV VACCINES  Aged Out    Health Maintenance  Health Maintenance Due  Topic Date Due   Pneumococcal Vaccine 43-23 Years old (1 of 2 - PCV) Never done   Diabetic kidney evaluation - Urine ACR  Never done   Hepatitis C Screening  Never done   DTaP/Tdap/Td (1 - Tdap) Never done    Zoster Vaccines- Shingrix (2 of 2) 01/07/2022   OPHTHALMOLOGY EXAM  07/30/2022   COVID-19 Vaccine (4 - 2024-25 season) 09/18/2022   FOOT EXAM  02/16/2023   HEMOGLOBIN A1C  03/24/2023   Health Maintenance Items Addressed: NEEDS EYE EXAM, FOOT EXAM  Additional Screening:  Vision Screening: Recommended annual ophthalmology exams for early detection of glaucoma and other disorders of the eye.  Dental Screening: Recommended annual dental exams for proper oral hygiene  Community Resource Referral / Chronic Care Management: CRR required this visit?  No   CCM required this visit?  No     Plan:     I have personally reviewed and noted the following in the patient's chart:   Medical and social history Use of alcohol, tobacco or illicit drugs  Current medications and supplements including opioid prescriptions. Patient is currently taking opioid prescriptions. Information provided to patient regarding non-opioid alternatives. Patient advised to discuss non-opioid treatment plan with their provider. Functional ability and status Nutritional status Physical activity Advanced directives List of other physicians Hospitalizations, surgeries, and ER visits in previous 12 months Vitals Screenings to include  cognitive, depression, and falls Referrals and appointments  In addition, I have reviewed and discussed with patient certain preventive protocols, quality metrics, and best practice recommendations. A written personalized care plan for preventive services as well as general preventive health recommendations were provided to patient.     Hal Hope, LPN   04/25/8117   After Visit Summary: (MyChart) Due to this being a telephonic visit, the after visit summary with patients personalized plan was offered to patient via MyChart   Notes: Nothing significant to report at this time.

## 2023-03-31 NOTE — Patient Instructions (Addendum)
 [name] , Thank you for taking time to come for your Medicare Wellness Visit. I appreciate your ongoing commitment to your health goals. Please review the following plan we discussed and let me know if I can assist you in the future.   Referrals/Orders/Follow-Ups/Clinician Recommendations: NONE  This is a list of the screening recommended for you and due dates:  Health Maintenance  Topic Date Due   Pneumococcal Vaccination (1 of 2 - PCV) Never done   Yearly kidney health urinalysis for diabetes  Never done   Hepatitis C Screening  Never done   DTaP/Tdap/Td vaccine (1 - Tdap) Never done   Zoster (Shingles) Vaccine (2 of 2) 01/07/2022   Eye exam for diabetics  07/30/2022   COVID-19 Vaccine (4 - 2024-25 season) 09/18/2022   Complete foot exam   02/16/2023   Hemoglobin A1C  03/24/2023   Yearly kidney function blood test for diabetes  09/25/2023   Medicare Annual Wellness Visit  03/30/2024   Colon Cancer Screening  04/23/2024   Flu Shot  Completed   HIV Screening  Completed   HPV Vaccine  Aged Out    Advanced directives: (ACP Link)Information on Advanced Care Planning can be found at [facility] [employer] Advance Health Care Directives Advance Health Care Directives. [email or web address]   Next Medicare Annual Wellness Visit scheduled for next year: Yes  04/05/24 @ 10:10 AM BY PHONE

## 2023-04-05 ENCOUNTER — Ambulatory Visit (INDEPENDENT_AMBULATORY_CARE_PROVIDER_SITE_OTHER): Payer: 59 | Admitting: Podiatry

## 2023-04-05 DIAGNOSIS — Z91199 Patient's noncompliance with other medical treatment and regimen due to unspecified reason: Secondary | ICD-10-CM

## 2023-04-05 NOTE — Progress Notes (Signed)
 1. No-show for appointment

## 2023-04-28 ENCOUNTER — Other Ambulatory Visit: Payer: Self-pay

## 2023-04-28 ENCOUNTER — Ambulatory Visit: Admitting: Sports Medicine

## 2023-04-28 ENCOUNTER — Encounter: Payer: Self-pay | Admitting: Sports Medicine

## 2023-04-28 DIAGNOSIS — G8929 Other chronic pain: Secondary | ICD-10-CM

## 2023-04-28 DIAGNOSIS — R5381 Other malaise: Secondary | ICD-10-CM

## 2023-04-28 DIAGNOSIS — M25551 Pain in right hip: Secondary | ICD-10-CM | POA: Diagnosis not present

## 2023-04-28 DIAGNOSIS — E1169 Type 2 diabetes mellitus with other specified complication: Secondary | ICD-10-CM

## 2023-04-28 DIAGNOSIS — M160 Bilateral primary osteoarthritis of hip: Secondary | ICD-10-CM | POA: Diagnosis not present

## 2023-04-28 MED ORDER — METHYLPREDNISOLONE ACETATE 40 MG/ML IJ SUSP
40.0000 mg | INTRAMUSCULAR | Status: AC | PRN
Start: 2023-04-28 — End: 2023-04-28
  Administered 2023-04-28: 40 mg via INTRA_ARTICULAR

## 2023-04-28 MED ORDER — LIDOCAINE HCL 1 % IJ SOLN
4.0000 mL | INTRAMUSCULAR | Status: AC | PRN
Start: 2023-04-28 — End: 2023-04-28
  Administered 2023-04-28: 4 mL

## 2023-04-28 NOTE — Progress Notes (Signed)
 James [name] - 63 y.o. male MRN [id number]  Date of birth: 04-17-1960  Office Visit Note: Visit Date: 04/28/2023 PCP: Smitty Cords, DO Referred by: Hyacinth Meeker, MD  Subjective: Chief Complaint  Patient presents with   Right Hip - Pain    Injection   Right Knee - Pain   HPI: James [name] is a pleasant 63 y.o. male who presents today for chronic bilateral hip pain with right being worse than left.  He is a resident of [facility].  He is not ambulatory.  He presents today via EMS in a stretcher. He has known severe osteoarthritis of bilateral hips and has seen Dr. Victory Dakin in the past but is not a surgical candidate.  I did perform an ultrasound-guided injection into the hip back in October 2024, he did not remember this when I spoke with him today.  He has significant pain, the right hip is worse than left.  He is on chronic pain medication which includes oxycodone HCL 10 mg 3 times daily as needed, he also receives morphine IR through his facility.  He also is on Lyrica 200 mg twice daily.  He states he still has significant pain despite this.  Pertinent ROS were reviewed with the patient and found to be negative unless otherwise specified above in HPI.   Assessment & Plan: Visit Diagnoses:  1. Chronic right hip pain   2. Primary osteoarthritis of both hips   3. Type 2 diabetes mellitus with morbid obesity (HCC)   4. Physical debility    Plan: Impression is chronic bilateral hip pain with severe osteoarthritis of each hip with exacerbation of the right greater than left hip recently.  He is not a surgical candidate, he is also nonambulatory and has been for the last few months, he presents today on a stretcher via EMS.  He would like to try an injection for pain relief, through shared decision making we did proceed with ultrasound-guided right hip injection.  I would like him to pay attention over the next 1-2 weeks to  see what degree of relief he receives.  If he does get relief, we could consider one for the contralateral hip.  Given patient body habitus, this may be better served under fluoroscopy with Dr. Alvester Morin.  He will continue his oxycodone 10 mg 3 times daily, his morphine IR as needed at his facility as well as Lyrica 200 mg twice daily.  Did recommend working with PT OT as able and attempt to try to get him more ambulatory, he can do this at his facility.  At the end of the visit, he did mention left shoulder pain, I do not see previous x-rays for this.  He will follow-up with his original providers Dr. Victory Dakin for evaluation of this.  Follow-up: Return for may make appt w/Blackman or Rexene Edison for L-shoulder eval.   Meds & Orders: No orders of the defined types were placed in this encounter.   Orders Placed This Encounter  Procedures   Large Joint Inj   US Guided Needle Placement - No Linked Charges     Procedures: Large Joint Inj: R hip joint on 04/28/2023 11:07 AM Indications: pain Details: 22 G 3.5 in needle, ultrasound-guided anterior approach Medications: 4 mL lidocaine 1 %; 40 mg methylPREDNISolone acetate 40 MG/ML Outcome: tolerated well, no immediate complications  Procedure: US-guided intra-articular hip injection, Right After discussion on risks/benefits/indications and informed verbal consent was obtained, a timeout was performed.  Patient was lying supine on exam table. The hip was cleaned with betadine and alcohol swabs. Then utilizing ultrasound guidance, the patient's femoral head and neck junction was identified and subsequently injected with 4:1 lidocaine:depomedrol via an in-plane approach with ultrasound visualization of the injectate administered into the hip joint. Patient tolerated procedure well without immediate complications.  Procedure, treatment alternatives, risks and benefits explained, specific risks discussed. Consent was given by the patient. Immediately  prior to procedure a time out was called to verify the correct patient, procedure, equipment, support staff and site/side marked as required. Patient was prepped and draped in the usual sterile fashion.          Clinical History: No specialty comments available.  He reports that he has been smoking cigarettes. He has a 2.5 pack-year smoking history. He has never used smokeless tobacco.  Recent Labs    06/20/22 0401 09/24/22 0608  HGBA1C 12.9* 7.0*    Objective:    Physical Exam  Gen: Well-appearing, in no acute distress; non-toxic CV: Well-perfused. Warm.  Resp: Breathing unlabored on room air; no wheezing. Psych: Fluid speech in conversation; appropriate affect; normal thought process  Ortho Exam - Bilateral hips: Patient is nonmobile.  Presents today via EMS in a stretcher, there is limited motion of the legs in general.  No redness swelling or effusion of the right hip.  Imaging:  *I personally reviewed and interpreted bilateral hip x-rays from 11/06/2022.  There is severe complete bone-on-bone arthritic change of each hip with some mild collapse of the right femoral head and neck junction without AVN.  There is notable spurring throughout both hip joints as well.  - 11/14/22: AP pelvis and lateral views of both hips: No acute fracture.  Both hips  well located.  Severe end-stage arthritis of the right hip with cystic  changes within the femoral head.  Left hip with bone-on-bone arthritic  changes with cystic changes within the femoral head.  No evidence of AVN  or acute fracture.   Past Medical/Family/Surgical/Social History: Medications & Allergies reviewed per EMR, new medications updated. Patient Active Problem List   Diagnosis Date Noted   Anemia, unspecified 11/16/2022   Lymphocytosis (symptomatic) 11/16/2022   Unable to care for self 09/29/2022   Unable to ambulate 09/29/2022   FTT (failure to thrive) in adult 09/23/2022   Hypokalemia 09/23/2022   Cellulitis  06/18/2022   Candidal intertrigo 06/18/2022   Hyperosmolar hyperglycemic state (HHS) (HCC) 06/17/2022   Chronic, continuous use of opioids 06/17/2022   Hyponatremia 06/17/2022   Hypercapnia 06/17/2022   Generalized weakness 06/17/2022   Asthma 03/01/2022   Dyspnea on exertion 03/01/2022   Essential hypertension 03/01/2022   Hypercholesterolemia 03/01/2022   Mild persistent asthma without complication 02/15/2022   Class 3 severe obesity with serious comorbidity and body mass index (BMI) of 50.0 to 59.9 in adult [facility]) 02/15/2022   Chronic thigh pain (Right) 12/14/2021    Class: Chronic   Class 3 obesity with alveolar hypoventilation, serious comorbidity, and body mass index (BMI) of 45.0 to 49.9 in adult (HCC) 11/04/2021   Epidural lipomatosis (L1-2 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) 11/03/2021   Lumbar facet arthropathy (Multilevel) (L1-2 through L5-S1) (Bilateral) 11/03/2021   Lumbar central spinal stenosis, 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 11/03/2021   Lumbar foraminal stenosis (Bilateral: L3-4, L4-5) (Left: L2-3) 11/03/2021   Lumbar lateral recess stenosis (Right: L3-4) (Bilateral: L4-5) 11/03/2021   Lumbar nerve root impingement (Right: L4) 11/03/2021   Herniated nucleus pulposus, L3-4 (Right) 11/03/2021   Hyperlipidemia associated with type  2 diabetes mellitus (HCC) 06/25/2021   Abnormal MRI, lumbar spine (05/21/2021) 05/24/2021   Chronic arthropathy 05/24/2021   Pes planus 05/24/2021   Osteoarthritis of AC (acromioclavicular) joint (Left) 04/29/2021   Osteoarthritis of shoulder (Left) 04/29/2021   Rotator cuff arthropathy of shoulder (Left) 04/29/2021   Lumbosacral facet hypertrophy (L4-5, L5-S1) 04/29/2021   Lumbar facet syndrome 04/29/2021   Abnormal MRI, hip (04/21/2021) 04/29/2021   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) 04/29/2021   Abnormal EMG (electromyogram) (03/16/2021) 04/29/2021   Polyneuropathy, 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   Diabetic sensorimotor polyneuropathy (HCC)  04/29/2021   Diabetic neuropathy (HCC) 04/26/2021   Osteoarthritis of hips (Bilateral) 03/29/2021   Osteoarthritis of hip (Left) 03/29/2021   Osteoarthritis of hip (Right) 03/29/2021   Osteoarthritis of knee (Left) 03/29/2021   Elevated sed rate 03/29/2021   Vitamin D deficiency 03/29/2021   Elevated C-reactive protein (CRP) 03/29/2021   Lumbosacral radiculopathy at L5 (Left) 03/29/2021   Fall (03/22/2021) 03/29/2021   Pharmacologic therapy 02/15/2021   Disorder of skeletal system 02/15/2021   Problems influencing health status 02/15/2021   Chronic use of opiate for therapeutic purpose 02/15/2021   Type 2 diabetes mellitus with morbid obesity (HCC) 02/15/2021   Chronic lower extremity pain (1ry area of Pain) (Bilateral) 02/15/2021   Chronic low back pain (4th area of Pain) (Bilateral) w/ sciatica (Bilateral) 02/15/2021   Chronic shoulder pain (5th area of Pain) (Left) 02/15/2021   Chronic hip pain (2ry area of Pain) (Bilateral) 02/15/2021   Chronic knee pain (3ry area of Pain) (Bilateral) 02/15/2021   Weakness of lower extremities (Bilateral) 01/15/2021   Recurrent falls 01/15/2021   Chronic pain syndrome 11/09/2020   DDD (degenerative disc disease), lumbar 11/09/2020   Primary osteoarthritis involving multiple joints 11/09/2020   Diabetes mellitus (HCC) 11/09/2020   Past Medical History:  Diagnosis Date   Arthritis    Diabetes mellitus without complication (HCC)    Hyperlipidemia    Hypertension    Family History  Problem Relation Age of Onset   Heart disease Mother    Arthritis Mother    Arthritis Father    Past Surgical History:  Procedure Laterality Date   APPENDECTOMY     COLONOSCOPY WITH PROPOFOL     COLONOSCOPY WITH PROPOFOL N/A 04/23/2021   Procedure: COLONOSCOPY WITH PROPOFOL;  Surgeon: Wyline Mood, MD;  Location: [facility] ENDOSCOPY;  Service: Gastroenterology;  Laterality: N/A;   Social History   Occupational History   Not on file  Tobacco Use   Smoking status:  Some Days    Current packs/day: 0.10    Average packs/day: 0.1 packs/day for 25.0 years (2.5 ttl pk-yrs)    Types: Cigarettes   Smokeless tobacco: Never  Vaping Use   Vaping status: Never Used  Substance and Sexual Activity   Alcohol use: Never   Drug use: Never   Sexual activity: Not on file

## 2023-04-28 NOTE — Progress Notes (Signed)
 Patient says that he has been having bilateral hip and knee pain that has caused him to be immobile. He says that both the right hip and the right knee are worse than the left side. He takes Oxycodone once a day. He says that he is always at a level 10 pain and is having trouble sleeping due to his pain.

## 2023-05-01 ENCOUNTER — Telehealth: Payer: Self-pay | Admitting: Orthopedic Surgery

## 2023-05-01 NOTE — Telephone Encounter (Signed)
Lvm for pt to cb to advise 

## 2023-05-01 NOTE — Telephone Encounter (Signed)
 Patient called and said he felt good over the weekend but now he starting to feel the pain back. CB#[phone]

## 2023-05-01 NOTE — Telephone Encounter (Signed)
 Pt called back and was advised

## 2023-05-15 ENCOUNTER — Telehealth: Payer: Self-pay | Admitting: Radiology

## 2023-05-15 ENCOUNTER — Ambulatory Visit (INDEPENDENT_AMBULATORY_CARE_PROVIDER_SITE_OTHER): Admitting: Physician Assistant

## 2023-05-15 ENCOUNTER — Encounter: Payer: Self-pay | Admitting: Physician Assistant

## 2023-05-15 ENCOUNTER — Other Ambulatory Visit (INDEPENDENT_AMBULATORY_CARE_PROVIDER_SITE_OTHER)

## 2023-05-15 DIAGNOSIS — M25512 Pain in left shoulder: Secondary | ICD-10-CM | POA: Diagnosis not present

## 2023-05-15 NOTE — Telephone Encounter (Signed)
 James [name]

## 2023-05-15 NOTE — Progress Notes (Signed)
 HPI: James [name] comes in today due to left shoulder pain.  He has had no new injury.  He is nonambulatory secondary to end-stage arthritis both hips.  Nonsurgical candidate given his obesity.  He is brought by EMS.  States he is having constant pain in his left shoulder.  No radicular symptoms.  Been ongoing for several months now.  He is diabetic but reports he does not know his hemoglobin A1c or if his glucose levels have been checked in the facility which he resides.  Review of systems: Negative for fevers chills.  Physical exam: Hemoglobin A1c 6.4 glucose 84 General well-developed well-nourished obese male lying on stretcher.  No acute distress.  Respirations: Unlabored  Bilateral shoulders: 5 out of 5 strength with external and internal rotation against resistance.  Impingement testing positive on the left negative on the right.  Liftoff test is positive on the left negative on the right.  Adduction bilateral shoulders negative   Radiographs: Left shoulder 2 views: No acute fracture.  High riding humeral head consistent with a shoulder arthropathy.  Otherwise no acute fractures.  Impression: Left shoulder arthropathy Diabetes mellitus  Plan: Given patient size recommend intra-articular injection under ultrasound we will schedule this with Dr. Vaughn Georges.  Will have to set this up another day for Dr. Vaughn Georges to perform this procedure.  Questions were encouraged and answered to the patient today.

## 2023-06-13 ENCOUNTER — Encounter: Payer: Self-pay | Admitting: Sports Medicine

## 2023-06-13 ENCOUNTER — Ambulatory Visit (INDEPENDENT_AMBULATORY_CARE_PROVIDER_SITE_OTHER): Admitting: Sports Medicine

## 2023-06-13 ENCOUNTER — Other Ambulatory Visit: Payer: Self-pay

## 2023-06-13 DIAGNOSIS — E1169 Type 2 diabetes mellitus with other specified complication: Secondary | ICD-10-CM | POA: Diagnosis not present

## 2023-06-13 DIAGNOSIS — M160 Bilateral primary osteoarthritis of hip: Secondary | ICD-10-CM

## 2023-06-13 DIAGNOSIS — G8929 Other chronic pain: Secondary | ICD-10-CM | POA: Diagnosis not present

## 2023-06-13 DIAGNOSIS — M12812 Other specific arthropathies, not elsewhere classified, left shoulder: Secondary | ICD-10-CM | POA: Diagnosis not present

## 2023-06-13 DIAGNOSIS — M25512 Pain in left shoulder: Secondary | ICD-10-CM

## 2023-06-13 MED ORDER — LIDOCAINE HCL 1 % IJ SOLN
2.0000 mL | INTRAMUSCULAR | Status: AC | PRN
Start: 2023-06-13 — End: 2023-06-13
  Administered 2023-06-13: 2 mL

## 2023-06-13 MED ORDER — BUPIVACAINE HCL 0.25 % IJ SOLN
2.0000 mL | INTRAMUSCULAR | Status: AC | PRN
Start: 2023-06-13 — End: 2023-06-13
  Administered 2023-06-13: 2 mL via INTRA_ARTICULAR

## 2023-06-13 MED ORDER — BETAMETHASONE SOD PHOS & ACET 6 (3-3) MG/ML IJ SUSP
6.0000 mg | INTRAMUSCULAR | Status: AC | PRN
Start: 2023-06-13 — End: 2023-06-13
  Administered 2023-06-13: 6 mg via INTRA_ARTICULAR

## 2023-06-13 NOTE — Progress Notes (Signed)
 James [name] - 63 y.o. male MRN [id number]  Date of birth: [birth year]  Office Visit Note: Visit Date: 06/13/2023 PCP: Raina Bunting, DO Referred by: Domingo Friend *  Subjective: Chief Complaint  Patient presents with   Left Shoulder - Pain   HPI: James [name] is a pleasant 63 y.o. male who presents today for acute on chronic left shoulder pain.  Left shoulder -no specific new injury.  He has been having constant left shoulder pain for the last few months.  He was initially evaluated by Malena Scull back in April, had x-rays which showed a high riding humeral head.  He has not had treatment yet today or injection therapy for this, but is interested in proceeding with this today.  Bilateral hips -he has severe osteoarthritis of bilateral hips.  He is nonambulatory.  Back on 04/28/2023 we did proceed with ultrasound-guided right hip intra-articular injection, warm states today this gave him excellent relief and he was feeling much better after this.  Here more recently now that his right hip has been feeling better, he does have pain within the left hip as well.  He is on chronic pain medication which includes oxycodone  HCL 10 mg 3 times daily as needed, he also receives morphine  IR through his facility. He also is on Lyrica  200 mg twice daily.   He is a type-II diabetic, but recent better controlled.  He does have his sugars checked at his facility.  Lab Results  Component Value Date   HGBA1C 7.0 (H) 09/24/2022   Pertinent ROS were reviewed with the patient and found to be negative unless otherwise specified above in HPI.   Assessment & Plan: Visit Diagnoses:  1. Chronic left shoulder pain   2. Rotator cuff arthropathy of shoulder (Left)   3. Type 2 diabetes mellitus with morbid obesity (HCC)   4. Osteoarthritis of hips (Bilateral)    Plan: Impression is chronic left shoulder pain with likely rotator cuff arthropathy given his high riding humeral head on  x-rays.  There is mild arthritic change but no high-grade or severe OA.  Through shared decision-making, we did proceed with ultrasound-guided glenohumeral joint injection in the anterior approach, patient tolerated well. Discussed post-injection protocol.  They also may check his blood glucose via protocol at the facility, advised on transient glucose rise given corticosteroid injection - may treat as needed. Also recommended PT/OT for the left shoulder as tolerated at his facility.  May use ice/heat/Tylenol  for any postinjection pain.  James [name] also has severe osteoarthritis of bilateral hips, and did receive good relief from prior hip injection on the right side.  His left side has become exacerbated at this point, I did review x-rays which show advanced OA bilaterally. I would like to see him back in about 2-3 weeks to evaluate the left hip and consider US -guided injection if needed.  For both his shoulder, as well as bilateral hip pain and arthritis he may continue his chronic pain medication including oxycodone  HCL 10 mg 3 times daily as needed, he also may continue his morphine  IR through his facility, his Lyrica  200 mg twice daily.  I did take time today to fill out his facility report note which was signed and given to him and his EMS/transport members.  Follow-up: May return > 2 weeks for L-hip evaluation and injection if desires   Meds & Orders: No orders of the defined types were placed in this encounter.   Orders Placed This Encounter  Procedures  Large Joint Inj   US  Guided Needle Placement - No Linked Charges     Procedures: Large Joint Inj: L glenohumeral on 06/13/2023 10:31 AM Indications: pain Details: 22 G 3.5 in needle, ultrasound-guided posterior approach Medications: 2 mL lidocaine  1 %; 2 mL bupivacaine 0.25 %; 6 mg betamethasone acetate-betamethasone sodium phosphate  6 (3-3) MG/ML Outcome: tolerated well, no immediate complications  US -guided glenohumeral joint injection, Left  shoulder After discussion on risks/benefits/indications, informed verbal consent was obtained. A timeout was then performed. The patient was positioned lying supine on examination table. The patient's shoulder was prepped with chloraprep and multiple alcohol swabs and utilizing ultrasound guidance, the patient's anterior glenohumeral joint was identified on ultrasound. Using ultrasound guidance a 22-gauge, 3.5 inch needle with a mixture of 2:2:1 cc's lidocaine :bupivicaine:betamethasone was directed from a lateral to medial direction via in-plane technique into the glenohumeral joint with visualization of appropriate spread of injectate into the joint. Patient tolerated the procedure well without immediate complications.      Procedure, treatment alternatives, risks and benefits explained, specific risks discussed. Consent was given by the patient. Immediately prior to procedure a time out was called to verify the correct patient, procedure, equipment, support staff and site/side marked as required. Patient was prepped and draped in the usual sterile fashion.          Clinical History: No specialty comments available.  He reports that he has been smoking cigarettes. He has a 2.5 pack-year smoking history. He has never used smokeless tobacco.  Recent Labs    06/20/22 0401 09/24/22 0608  HGBA1C 12.9* 7.0*    Objective:    Physical Exam  Gen: Well-appearing, in no acute distress; non-toxic CV: Well-perfused. Warm.  Resp: Breathing unlabored on room air; no wheezing. Psych: Fluid speech in conversation; appropriate affect; normal thought process  Ortho Exam - Left shoulder: There is no redness swelling or effusion.  There is pain with end range external rotation.  No specific bony blocking.  Positive speeds test.  - Hips: Patient is confined to a stretcher. Limited ROM with logroll.   Imaging:  *I did personally review both the bilateral hip x-ray as well as the left shoulder x-ray as  below:  05/15/23:  Left shoulder 2 views: No acute fracture.  High riding humeral head  consistent with a shoulder arthropathy.  Otherwise no acute fractures.   Narrative & Impression  CLINICAL DATA:  Bilateral leg pain   EXAM: DG HIP (WITH OR WITHOUT PELVIS) 5+V BILAT   COMPARISON:  MRI 04/20/2021, radiograph 02/23/2021   FINDINGS: SI joints are non widened.   pubic symphysis and rami appear intact.   Left hip: Severe arthritis of the left hip with superior bone on bone appearance and subarticular sclerosis. Bulky spurring inferiorly.   Right hip:   IMPRESSION: Severe right greater than left hip arthritis without acute osseous abnormality     Electronically Signed   By: Esmeralda Hedge M.D.   On: 09/23/2022 18:11    Past Medical/Family/Surgical/Social History: Medications & Allergies reviewed per EMR, new medications updated. Patient Active Problem List   Diagnosis Date Noted   Anemia, unspecified 11/16/2022   Lymphocytosis (symptomatic) 11/16/2022   Unable to care for self 09/29/2022   Unable to ambulate 09/29/2022   FTT (failure to thrive) in adult 09/23/2022   Hypokalemia 09/23/2022   Cellulitis 06/18/2022   Candidal intertrigo 06/18/2022   Hyperosmolar hyperglycemic state (HHS) (HCC) 06/17/2022   Chronic, continuous use of opioids 06/17/2022   Hyponatremia 06/17/2022  Hypercapnia 06/17/2022   Generalized weakness 06/17/2022   Asthma 03/01/2022   Dyspnea on exertion 03/01/2022   Essential hypertension 03/01/2022   Hypercholesterolemia 03/01/2022   Mild persistent asthma without complication 02/15/2022   Class 3 severe obesity with serious comorbidity and body mass index (BMI) of 50.0 to 59.9 in adult 02/15/2022   Chronic thigh pain (Right) 12/14/2021    Class: Chronic   Class 3 obesity with alveolar hypoventilation, serious comorbidity, and body mass index (BMI) of 45.0 to 49.9 in adult (HCC) 11/04/2021   Epidural lipomatosis (L1-2 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)  11/03/2021   Lumbar facet arthropathy (Multilevel) (L1-2 through L5-S1) (Bilateral) 11/03/2021   Lumbar central spinal stenosis, 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 11/03/2021   Lumbar foraminal stenosis (Bilateral: L3-4, L4-5) (Left: L2-3) 11/03/2021   Lumbar lateral recess stenosis (Right: L3-4) (Bilateral: L4-5) 11/03/2021   Lumbar nerve root impingement (Right: L4) 11/03/2021   Herniated nucleus pulposus, L3-4 (Right) 11/03/2021   Hyperlipidemia associated with type 2 diabetes mellitus (HCC) 06/25/2021   Abnormal MRI, lumbar spine (05/21/2021) 05/24/2021   Chronic arthropathy 05/24/2021   Pes planus 05/24/2021   Osteoarthritis of AC (acromioclavicular) joint (Left) 04/29/2021   Osteoarthritis of shoulder (Left) 04/29/2021   Rotator cuff arthropathy of shoulder (Left) 04/29/2021   Lumbosacral facet hypertrophy (L4-5, L5-S1) 04/29/2021   Lumbar facet syndrome 04/29/2021   Abnormal MRI, hip (04/21/2021) 04/29/2021   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) 04/29/2021   Abnormal EMG (electromyogram) (03/16/2021) 04/29/2021   Polyneuropathy, 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   Diabetic sensorimotor polyneuropathy (HCC) 04/29/2021   Diabetic neuropathy (HCC) 04/26/2021   Osteoarthritis of hips (Bilateral) 03/29/2021   Osteoarthritis of hip (Left) 03/29/2021   Osteoarthritis of hip (Right) 03/29/2021   Osteoarthritis of knee (Left) 03/29/2021   Elevated sed rate 03/29/2021   Vitamin D  deficiency 03/29/2021   Elevated C-reactive protein (CRP) 03/29/2021   Lumbosacral radiculopathy at L5 (Left) 03/29/2021   Fall (03/22/2021) 03/29/2021   Pharmacologic therapy 02/15/2021   Disorder of skeletal system 02/15/2021   Problems influencing health status 02/15/2021   Chronic use of opiate for therapeutic purpose 02/15/2021   Type 2 diabetes mellitus with morbid obesity (HCC) 02/15/2021   Chronic lower extremity pain (1ry area of Pain) (Bilateral) 02/15/2021   Chronic low back pain (4th area of  Pain) (Bilateral) w/ sciatica (Bilateral) 02/15/2021   Chronic shoulder pain (5th area of Pain) (Left) 02/15/2021   Chronic hip pain (2ry area of Pain) (Bilateral) 02/15/2021   Chronic knee pain (3ry area of Pain) (Bilateral) 02/15/2021   Weakness of lower extremities (Bilateral) 01/15/2021   Recurrent falls 01/15/2021   Chronic pain syndrome 11/09/2020   DDD (degenerative disc disease), lumbar 11/09/2020   Primary osteoarthritis involving multiple joints 11/09/2020   Diabetes mellitus (HCC) 11/09/2020   Past Medical History:  Diagnosis Date   Arthritis    Diabetes mellitus without complication (HCC)    Hyperlipidemia    Hypertension    Family History  Problem Relation Age of Onset   Heart disease Mother    Arthritis Mother    Arthritis Father    Past Surgical History:  Procedure Laterality Date   APPENDECTOMY     COLONOSCOPY WITH PROPOFOL      COLONOSCOPY WITH PROPOFOL  N/A 04/23/2021   Procedure: COLONOSCOPY WITH PROPOFOL ;  Surgeon: Luke Salaam, MD;  Location: [facility] ENDOSCOPY;  Service: Gastroenterology;  Laterality: N/A;   Social History   Occupational History   Not on file  Tobacco Use   Smoking status: Some Days    Current  packs/day: 0.10    Average packs/day: 0.1 packs/day for 25.0 years (2.5 ttl pk-yrs)    Types: Cigarettes   Smokeless tobacco: Never  Vaping Use   Vaping status: Never Used  Substance and Sexual Activity   Alcohol use: Never   Drug use: Never   Sexual activity: Not on file

## 2023-06-28 ENCOUNTER — Ambulatory Visit (INDEPENDENT_AMBULATORY_CARE_PROVIDER_SITE_OTHER): Admitting: Sports Medicine

## 2023-06-28 ENCOUNTER — Other Ambulatory Visit: Payer: Self-pay

## 2023-06-28 DIAGNOSIS — M25552 Pain in left hip: Secondary | ICD-10-CM | POA: Diagnosis not present

## 2023-06-28 DIAGNOSIS — M1612 Unilateral primary osteoarthritis, left hip: Secondary | ICD-10-CM

## 2023-06-28 DIAGNOSIS — E1169 Type 2 diabetes mellitus with other specified complication: Secondary | ICD-10-CM | POA: Diagnosis not present

## 2023-06-28 DIAGNOSIS — G8929 Other chronic pain: Secondary | ICD-10-CM

## 2023-06-28 DIAGNOSIS — M160 Bilateral primary osteoarthritis of hip: Secondary | ICD-10-CM

## 2023-06-28 DIAGNOSIS — R251 Tremor, unspecified: Secondary | ICD-10-CM | POA: Diagnosis not present

## 2023-06-28 NOTE — Progress Notes (Signed)
 James [name] - 63 y.o. male MRN [id number]  Date of birth: 13-Jul-1960  Office Visit Note: Visit Date: 06/28/2023 PCP: Raina Bunting, DO Referred by: Domingo Friend *  Subjective: Chief Complaint  Patient presents with   Left Hip - Pain   HPI: James [name] is a pleasant 63 y.o. male who presents today for acute on chronic left hip pain with known advanced bilateral hip arthritis.  Bilateral hips -he has known advanced bone-on-bone arthritis of both hips.  Did receive good relief from prior right hip injection many months ago.  He would like to proceed with left hip injection today.  He is on chronic oxycodone  and morphine  IR as needed for his facility as well as Lyrica  200 mg daily.  He presents in a stretcher, but states that the left hip feels as good as the right hip did he is wanting to work on ambulating.  He has completed his PT in the facility but is open to resuming some to work towards this goal.  Shaking episodes -he states that since her last visit at times he will have episodes of the arms and hands shaking.  Cannot pinpoint the exact timing or origin of this.  He does have diabetes, he has checked his sugars before but does not routinely check POC glucose.  Lab Results  Component Value Date   HGBA1C 7.0 (H) 09/24/2022   Pertinent ROS were reviewed with the patient and found to be negative unless otherwise specified above in HPI.   Assessment & Plan: Visit Diagnoses:  1. Chronic left hip pain   2. Osteoarthritis of hips (Bilateral)   3. Type 2 diabetes mellitus with morbid obesity (HCC)   4. Episode of shaking    Plan: Impression is exacerbation of chronic left hip pain with severe bone-on-bone osteoarthritis.  Through shared decision making, we did proceed with ultrasound-guided intra-articular hip injection, patient tolerated well.  Advised on postinjection protocol.  If his insurance allows, I would recommend revisiting PT or OT at his  facility to work on strengthening and stabilizing the hips if he wishes to try ambulating under assistance.  Did recommend POC glucose checks given transient rise from corticosteroid injection as well as evaluating if either hypoglycemia or hyperglycemia may be the cause of his upper extremity shaking episodes.  Recommended this be evaluated by his medical PCP as well.  I did fill this out and write this in his facility facesheet form today. He will continue his oxycodone  10 mg 3 times daily, his morphine  IR as needed at his facility as well as Lyrica  200 mg twice daily.  He will follow back up with Malena Scull and I am happy to see him back as needed.  If for some reason injections are not helpful, may consider fluoroscopy based joint injections with Dr. Daisey Dryer in the future given body habitus.  Follow-up: Return in about 6 weeks (around 08/09/2023), or if symptoms worsen or fail to improve, for with Malena Scull as needed.   Meds & Orders: No orders of the defined types were placed in this encounter.   Orders Placed This Encounter  Procedures   Large Joint Inj: L hip joint   US  Guided Needle Placement - No Linked Charges     Procedures: Large Joint Inj: L hip joint on 06/28/2023 11:02 AM Indications: pain Details: 22 G 3.5 in needle, ultrasound-guided anterior approach Medications: 4 mL lidocaine  1 %; 40 mg methylPREDNISolone  acetate 40 MG/ML Outcome: tolerated well, no immediate complications  Procedure: US -guided intra-articular hip injection, left After discussion on risks/benefits/indications and informed verbal consent was obtained, a timeout was performed. Patient was lying supine on exam table. The hip was cleaned with betadine and alcohol swabs. Then utilizing ultrasound guidance, the patient's femoral head and neck junction was identified and subsequently injected with 4:1 lidocaine :depomedrol via an in-plane approach with ultrasound visualization of the injectate administered into the hip  joint. Patient tolerated procedure well without immediate complications.  Procedure, treatment alternatives, risks and benefits explained, specific risks discussed. Consent was given by the patient. Immediately prior to procedure a time out was called to verify the correct patient, procedure, equipment, support staff and site/side marked as required. Patient was prepped and draped in the usual sterile fashion.          Clinical History: No specialty comments available.  He reports that he has been smoking cigarettes. He has a 2.5 pack-year smoking history. He has never used smokeless tobacco.  Recent Labs    09/24/22 0608  HGBA1C 7.0*    Objective:    Physical Exam  Gen: Well-appearing, in no acute distress; non-toxic CV: Well-perfused. Warm.  Resp: Breathing unlabored on room air; no wheezing. Psych: Fluid speech in conversation; appropriate affect; normal thought process  Ortho Exam - Left hip: Patient is nonambulatory, laying in EMS stretcher.  There is no redness swelling or effusion of the left hip.  There is limitation with motion about the hip in all planes.  Imaging: 11/14/22: AP pelvis and lateral views of both hips: No acute fracture.  Both hips  well located.  Severe end-stage arthritis of the right hip with cystic  changes within the femoral head.  Left hip with bone-on-bone arthritic  changes with cystic changes within the femoral head.  No evidence of AVN  or acute fracture.   Past Medical/Family/Surgical/Social History: Medications & Allergies reviewed per EMR, new medications updated. Patient Active Problem List   Diagnosis Date Noted   Anemia, unspecified 11/16/2022   Lymphocytosis (symptomatic) 11/16/2022   Unable to care for self 09/29/2022   Unable to ambulate 09/29/2022   FTT (failure to thrive) in adult 09/23/2022   Hypokalemia 09/23/2022   Cellulitis 06/18/2022   Candidal intertrigo 06/18/2022   Hyperosmolar hyperglycemic state (HHS) (HCC)  06/17/2022   Chronic, continuous use of opioids 06/17/2022   Hyponatremia 06/17/2022   Hypercapnia 06/17/2022   Generalized weakness 06/17/2022   Asthma 03/01/2022   Dyspnea on exertion 03/01/2022   Essential hypertension 03/01/2022   Hypercholesterolemia 03/01/2022   Mild persistent asthma without complication 02/15/2022   Class 3 severe obesity with serious comorbidity and body mass index (BMI) of 50.0 to 59.9 in adult 02/15/2022   Chronic thigh pain (Right) 12/14/2021    Class: Chronic   Class 3 obesity with alveolar hypoventilation, serious comorbidity, and body mass index (BMI) of 45.0 to 49.9 in adult (HCC) 11/04/2021   Epidural lipomatosis (L1-2 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) 11/03/2021   Lumbar facet arthropathy (Multilevel) (L1-2 through L5-S1) (Bilateral) 11/03/2021   Lumbar central spinal stenosis, 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 11/03/2021   Lumbar foraminal stenosis (Bilateral: L3-4, L4-5) (Left: L2-3) 11/03/2021   Lumbar lateral recess stenosis (Right: L3-4) (Bilateral: L4-5) 11/03/2021   Lumbar nerve root impingement (Right: L4) 11/03/2021   Herniated nucleus pulposus, L3-4 (Right) 11/03/2021   Hyperlipidemia associated with type 2 diabetes mellitus (HCC) 06/25/2021   Abnormal MRI, lumbar spine (05/21/2021) 05/24/2021   Chronic arthropathy 05/24/2021   Pes planus 05/24/2021   Osteoarthritis of AC (acromioclavicular) joint (Left) 04/29/2021  Osteoarthritis of shoulder (Left) 04/29/2021   Rotator cuff arthropathy of shoulder (Left) 04/29/2021   Lumbosacral facet hypertrophy (L4-5, L5-S1) 04/29/2021   Lumbar facet syndrome 04/29/2021   Abnormal MRI, hip (04/21/2021) 04/29/2021   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) 04/29/2021   Abnormal EMG (electromyogram) (03/16/2021) 04/29/2021   Polyneuropathy, 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   Diabetic sensorimotor polyneuropathy (HCC) 04/29/2021   Diabetic neuropathy (HCC) 04/26/2021   Osteoarthritis of hips (Bilateral) 03/29/2021    Osteoarthritis of hip (Left) 03/29/2021   Osteoarthritis of hip (Right) 03/29/2021   Osteoarthritis of knee (Left) 03/29/2021   Elevated sed rate 03/29/2021   Vitamin D  deficiency 03/29/2021   Elevated C-reactive protein (CRP) 03/29/2021   Lumbosacral radiculopathy at L5 (Left) 03/29/2021   Fall (03/22/2021) 03/29/2021   Pharmacologic therapy 02/15/2021   Disorder of skeletal system 02/15/2021   Problems influencing health status 02/15/2021   Chronic use of opiate for therapeutic purpose 02/15/2021   Type 2 diabetes mellitus with morbid obesity (HCC) 02/15/2021   Chronic lower extremity pain (1ry area of Pain) (Bilateral) 02/15/2021   Chronic low back pain (4th area of Pain) (Bilateral) w/ sciatica (Bilateral) 02/15/2021   Chronic shoulder pain (5th area of Pain) (Left) 02/15/2021   Chronic hip pain (2ry area of Pain) (Bilateral) 02/15/2021   Chronic knee pain (3ry area of Pain) (Bilateral) 02/15/2021   Weakness of lower extremities (Bilateral) 01/15/2021   Recurrent falls 01/15/2021   Chronic pain syndrome 11/09/2020   DDD (degenerative disc disease), lumbar 11/09/2020   Primary osteoarthritis involving multiple joints 11/09/2020   Diabetes mellitus (HCC) 11/09/2020   Past Medical History:  Diagnosis Date   Arthritis    Diabetes mellitus without complication (HCC)    Hyperlipidemia    Hypertension    Family History  Problem Relation Age of Onset   Heart disease Mother    Arthritis Mother    Arthritis Father    Past Surgical History:  Procedure Laterality Date   APPENDECTOMY     COLONOSCOPY WITH PROPOFOL      COLONOSCOPY WITH PROPOFOL  N/A 04/23/2021   Procedure: COLONOSCOPY WITH PROPOFOL ;  Surgeon: Luke Salaam, MD;  Location: [facility] ENDOSCOPY;  Service: Gastroenterology;  Laterality: N/A;   Social History   Occupational History   Not on file  Tobacco Use   Smoking status: Some Days    Current packs/day: 0.10    Average packs/day: 0.1 packs/day for 25.0 years (2.5 ttl  pk-yrs)    Types: Cigarettes   Smokeless tobacco: Never  Vaping Use   Vaping status: Never Used  Substance and Sexual Activity   Alcohol use: Never   Drug use: Never   Sexual activity: Not on file

## 2023-06-29 ENCOUNTER — Encounter: Payer: Self-pay | Admitting: Sports Medicine

## 2023-06-29 MED ORDER — LIDOCAINE HCL 1 % IJ SOLN
4.0000 mL | INTRAMUSCULAR | Status: AC | PRN
Start: 2023-06-28 — End: 2023-06-28
  Administered 2023-06-28: 4 mL

## 2023-06-29 MED ORDER — METHYLPREDNISOLONE ACETATE 40 MG/ML IJ SUSP
40.0000 mg | INTRAMUSCULAR | Status: AC | PRN
Start: 2023-06-28 — End: 2023-06-28
  Administered 2023-06-28: 40 mg via INTRA_ARTICULAR

## 2023-07-19 ENCOUNTER — Encounter: Admitting: Family

## 2023-07-25 ENCOUNTER — Ambulatory Visit (INDEPENDENT_AMBULATORY_CARE_PROVIDER_SITE_OTHER): Admitting: Family

## 2023-07-25 DIAGNOSIS — M79605 Pain in left leg: Secondary | ICD-10-CM | POA: Diagnosis not present

## 2023-07-25 DIAGNOSIS — M79672 Pain in left foot: Secondary | ICD-10-CM

## 2023-07-25 DIAGNOSIS — M79604 Pain in right leg: Secondary | ICD-10-CM

## 2023-07-25 DIAGNOSIS — I872 Venous insufficiency (chronic) (peripheral): Secondary | ICD-10-CM | POA: Diagnosis not present

## 2023-07-25 DIAGNOSIS — M79671 Pain in right foot: Secondary | ICD-10-CM | POA: Diagnosis not present

## 2023-07-26 ENCOUNTER — Encounter: Payer: Self-pay | Admitting: Family

## 2023-07-26 NOTE — Progress Notes (Signed)
 Office Visit Note   Patient: James [name]           Date of Birth: 1960/07/23           MRN: [id number] Visit Date: 07/25/2023              Requested by: Edman Marsa PARAS, DO [address] [place],  [place] [address] PCP: Edman Marsa PARAS, DO  Chief Complaint  Patient presents with   Right Foot - Pain   Left Foot - Pain      HPI: The patient is a 63 year old gentleman who presents today complaining of bilateral foot pain this is associated with cramping swelling he reports that the toes curling and have felt stiff.  He feels the sensation of  dead skin on the bottoms of his feet endorses numbness  History of neuropathy  Reports he is unable to elevate his legs.  Reports the bed at his long-term care facility do not elevate he reports his legs are dependent the majority of the day due to malfunction of his hospital bed  Assessment & Plan: Visit Diagnoses: No diagnosis found.  Plan: Discussed the importance of compression recommended compression garments have requested rehab order for him 15 to 20 mmHg graduated compression elevate the lower extremities throughout the day.  Discussed if his bed is malfunctioning he may require a new bed with the future of elevating the foot of the bed.  He is currently on Lyrica  200 mg twice daily for neuropathic pain  Follow-Up Instructions: No follow-ups on file.   Ortho Exam  Patient is alert, oriented, no adenopathy, well-dressed, normal affect, normal respiratory effort. On examination bilateral lower extremities there is 2+ pitting edema he does have palpable dorsalis pedis pulses.  There is no weeping no erythema no warmth no impending ulceration he has a decreased sensation to the plantar aspect bilateral feet    Imaging: No results found. No images are attached to the encounter.  Labs: Lab Results  Component Value Date   HGBA1C 7.0 (H) 09/24/2022   HGBA1C 12.9 (H) 06/20/2022   HGBA1C 6.0 (A) 02/15/2022    ESRSEDRATE 28 11/16/2022   ESRSEDRATE 39 (H) 02/15/2021   CRP 17 (H) 11/16/2022   CRP 29 (H) 02/15/2021   LABURIC 6.3 02/19/2021     Lab Results  Component Value Date   ALBUMIN 3.2 (L) 09/24/2022   ALBUMIN 3.8 09/23/2022   ALBUMIN 3.9 06/17/2022    Lab Results  Component Value Date   MG 2.2 09/25/2022   MG 1.6 (L) 09/24/2022   MG 1.8 02/15/2021   No results found for: VD25OH  No results found for: PREALBUMIN    Latest Ref Rng & Units 09/24/2022    6:09 AM 09/23/2022    2:25 PM 06/17/2022    9:09 PM  CBC EXTENDED  WBC 4.0 - 10.5 K/uL 8.8  13.5  12.3   RBC 4.22 - 5.81 MIL/uL 4.88  5.30  5.71   Hemoglobin 13.0 - 17.0 g/dL 85.7  84.5  83.4   HCT 39.0 - 52.0 % 43.8  46.7  50.1   Platelets 150 - 400 K/uL 336  386  294   NEUT# 1.7 - 7.7 K/uL   9.6   Lymph# 0.7 - 4.0 K/uL   1.5      There is no height or weight on file to calculate BMI.  Orders:  No orders of the defined types were placed in this encounter.  No orders of the defined  types were placed in this encounter.    Procedures: No procedures performed  Clinical Data: No additional findings.  ROS:  All other systems negative, except as noted in the HPI. Review of Systems  Objective: Vital Signs: There were no vitals taken for this visit.  Specialty Comments:  No specialty comments available.  PMFS History: Patient Active Problem List   Diagnosis Date Noted   Anemia, unspecified 11/16/2022   Lymphocytosis (symptomatic) 11/16/2022   Unable to care for self 09/29/2022   Unable to ambulate 09/29/2022   FTT (failure to thrive) in adult 09/23/2022   Hypokalemia 09/23/2022   Cellulitis 06/18/2022   Candidal intertrigo 06/18/2022   Hyperosmolar hyperglycemic state (HHS) (HCC) 06/17/2022   Chronic, continuous use of opioids 06/17/2022   Hyponatremia 06/17/2022   Hypercapnia 06/17/2022   Generalized weakness 06/17/2022   Asthma 03/01/2022   Dyspnea on exertion 03/01/2022   Essential hypertension  03/01/2022   Hypercholesterolemia 03/01/2022   Mild persistent asthma without complication 02/15/2022   Class 3 severe obesity with serious comorbidity and body mass index (BMI) of 50.0 to 59.9 in adult 02/15/2022   Chronic thigh pain (Right) 12/14/2021    Class: Chronic   Class 3 obesity with alveolar hypoventilation, serious comorbidity, and body mass index (BMI) of 45.0 to 49.9 in adult (HCC) 11/04/2021   Epidural lipomatosis (L1-2 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) 11/03/2021   Lumbar facet arthropathy (Multilevel) (L1-2 through L5-S1) (Bilateral) 11/03/2021   Lumbar central spinal stenosis, 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 11/03/2021   Lumbar foraminal stenosis (Bilateral: L3-4, L4-5) (Left: L2-3) 11/03/2021   Lumbar lateral recess stenosis (Right: L3-4) (Bilateral: L4-5) 11/03/2021   Lumbar nerve root impingement (Right: L4) 11/03/2021   Herniated nucleus pulposus, L3-4 (Right) 11/03/2021   Hyperlipidemia associated with type 2 diabetes mellitus (HCC) 06/25/2021   Abnormal MRI, lumbar spine (05/21/2021) 05/24/2021   Chronic arthropathy 05/24/2021   Pes planus 05/24/2021   Osteoarthritis of AC (acromioclavicular) joint (Left) 04/29/2021   Osteoarthritis of shoulder (Left) 04/29/2021   Rotator cuff arthropathy of shoulder (Left) 04/29/2021   Lumbosacral facet hypertrophy (L4-5, L5-S1) 04/29/2021   Lumbar facet syndrome 04/29/2021   Abnormal MRI, hip (04/21/2021) 04/29/2021   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) 04/29/2021   Abnormal EMG (electromyogram) (03/16/2021) 04/29/2021   Polyneuropathy, 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   Diabetic sensorimotor polyneuropathy (HCC) 04/29/2021   Diabetic neuropathy (HCC) 04/26/2021   Osteoarthritis of hips (Bilateral) 03/29/2021   Osteoarthritis of hip (Left) 03/29/2021   Osteoarthritis of hip (Right) 03/29/2021   Osteoarthritis of knee (Left) 03/29/2021   Elevated sed rate 03/29/2021   Vitamin D  deficiency 03/29/2021   Elevated C-reactive protein  (CRP) 03/29/2021   Lumbosacral radiculopathy at L5 (Left) 03/29/2021   Fall (03/22/2021) 03/29/2021   Pharmacologic therapy 02/15/2021   Disorder of skeletal system 02/15/2021   Problems influencing health status 02/15/2021   Chronic use of opiate for therapeutic purpose 02/15/2021   Type 2 diabetes mellitus with morbid obesity (HCC) 02/15/2021   Chronic lower extremity pain (1ry area of Pain) (Bilateral) 02/15/2021   Chronic low back pain (4th area of Pain) (Bilateral) w/ sciatica (Bilateral) 02/15/2021   Chronic shoulder pain (5th area of Pain) (Left) 02/15/2021   Chronic hip pain (2ry area of Pain) (Bilateral) 02/15/2021   Chronic knee pain (3ry area of Pain) (Bilateral) 02/15/2021   Weakness of lower extremities (Bilateral) 01/15/2021   Recurrent falls 01/15/2021   Chronic pain syndrome 11/09/2020   DDD (degenerative disc disease), lumbar 11/09/2020   Primary osteoarthritis involving multiple joints 11/09/2020  Diabetes mellitus (HCC) 11/09/2020   Past Medical History:  Diagnosis Date   Arthritis    Diabetes mellitus without complication (HCC)    Hyperlipidemia    Hypertension     Family History  Problem Relation Age of Onset   Heart disease Mother    Arthritis Mother    Arthritis Father     Past Surgical History:  Procedure Laterality Date   APPENDECTOMY     COLONOSCOPY WITH PROPOFOL      COLONOSCOPY WITH PROPOFOL  N/A 04/23/2021   Procedure: COLONOSCOPY WITH PROPOFOL ;  Surgeon: Therisa Bi, MD;  Location: [facility] ENDOSCOPY;  Service: Gastroenterology;  Laterality: N/A;   Social History   Occupational History   Not on file  Tobacco Use   Smoking status: Some Days    Current packs/day: 0.10    Average packs/day: 0.1 packs/day for 25.0 years (2.5 ttl pk-yrs)    Types: Cigarettes   Smokeless tobacco: Never  Vaping Use   Vaping status: Never Used  Substance and Sexual Activity   Alcohol use: Never   Drug use: Never   Sexual activity: Not on file

## 2023-08-09 ENCOUNTER — Ambulatory Visit (INDEPENDENT_AMBULATORY_CARE_PROVIDER_SITE_OTHER): Admitting: Physician Assistant

## 2023-08-09 ENCOUNTER — Encounter: Payer: Self-pay | Admitting: Physician Assistant

## 2023-08-09 DIAGNOSIS — M25512 Pain in left shoulder: Secondary | ICD-10-CM

## 2023-08-09 DIAGNOSIS — M160 Bilateral primary osteoarthritis of hip: Secondary | ICD-10-CM | POA: Diagnosis not present

## 2023-08-09 DIAGNOSIS — E1169 Type 2 diabetes mellitus with other specified complication: Secondary | ICD-10-CM

## 2023-08-09 NOTE — Progress Notes (Signed)
 HPI: Mr. Boeke returns today follow-up status post intra-articular injection left shoulder and left hip intra-articular injection.  He states both of these were very helpful.  He has known bilateral hips advanced arthritis.  He presents today on the stretcher he is transported by EMS.  He notes that physical therapy at the skilled facility has been quite helpful but has stopped at this point in time.  He is asking if this can be resumed.  Physical exam: General: Awake and alert no acute distress. Left shoulder: Good range of motion with overhead activity passively without pain.  Impingement testing is negative.  Liftoff test on the left is negative. Bilateral hips: Any attempts of range of motion causes pain both hips.  Patient unable to bend either knee actively or passively.  Impression: Left shoulder rotator cuff arthropathy Bilateral hip osteoarthritis  Plan: Recommend that he resume physical therapy for transfers gait balance.  In regards to the right hip we will have him scheduled for a right hip intra-articular injection with Dr. Burnetta in the near future.  He will follow-up with us  as needed.  Questions were encouraged and answered at length.

## 2023-08-17 ENCOUNTER — Ambulatory Visit (HOSPITAL_COMMUNITY)
Admission: RE | Admit: 2023-08-17 | Discharge: 2023-08-17 | Disposition: A | Discharge status: Home / Self Care | Source: Ambulatory Visit | Attending: Vascular Surgery | Admitting: Vascular Surgery

## 2023-08-17 DIAGNOSIS — I872 Venous insufficiency (chronic) (peripheral): Secondary | ICD-10-CM | POA: Insufficient documentation

## 2023-08-17 DIAGNOSIS — M79672 Pain in left foot: Secondary | ICD-10-CM | POA: Insufficient documentation

## 2023-08-17 DIAGNOSIS — M79671 Pain in right foot: Secondary | ICD-10-CM | POA: Insufficient documentation

## 2023-08-21 LAB — VAS US ABI WITH/WO TBI
Left ABI: 1.25
Right ABI: 1.24

## 2023-08-24 ENCOUNTER — Ambulatory Visit: Admitting: Sports Medicine

## 2023-08-31 ENCOUNTER — Ambulatory Visit (INDEPENDENT_AMBULATORY_CARE_PROVIDER_SITE_OTHER): Admitting: Sports Medicine

## 2023-08-31 ENCOUNTER — Other Ambulatory Visit: Payer: Self-pay

## 2023-08-31 DIAGNOSIS — G8929 Other chronic pain: Secondary | ICD-10-CM

## 2023-08-31 DIAGNOSIS — M25551 Pain in right hip: Secondary | ICD-10-CM | POA: Diagnosis not present

## 2023-08-31 DIAGNOSIS — M160 Bilateral primary osteoarthritis of hip: Secondary | ICD-10-CM | POA: Diagnosis not present

## 2023-08-31 DIAGNOSIS — E1169 Type 2 diabetes mellitus with other specified complication: Secondary | ICD-10-CM | POA: Diagnosis not present

## 2023-08-31 DIAGNOSIS — M1611 Unilateral primary osteoarthritis, right hip: Secondary | ICD-10-CM | POA: Diagnosis not present

## 2023-08-31 MED ORDER — METHYLPREDNISOLONE ACETATE 40 MG/ML IJ SUSP
40.0000 mg | INTRAMUSCULAR | Status: AC | PRN
Start: 2023-08-31 — End: 2023-08-31
  Administered 2023-08-31: 40 mg via INTRA_ARTICULAR

## 2023-08-31 MED ORDER — LIDOCAINE HCL 1 % IJ SOLN
4.0000 mL | INTRAMUSCULAR | Status: AC | PRN
Start: 2023-08-31 — End: 2023-08-31
  Administered 2023-08-31: 4 mL

## 2023-08-31 NOTE — Progress Notes (Signed)
 James [name] - 63 y.o. male MRN [id number]  Date of birth: 1960-03-26  Office Visit Note: Visit Date: 08/31/2023 PCP: Edman Marsa PARAS, DO Referred by: Edman Marsa *  Subjective: Chief Complaint  Patient presents with   Right Hip - Pain   HPI: James [name] is a pleasant 63 y.o. male who presents today for acute on chronic right hip pain with advanced bilateral hip osteoarthritis.  He is a resident of [facility].  He is not ambulatory.  He presents today via EMS in a stretcher. He has known severe osteoarthritis of bilateral hips and has seen Dr. Lorane Gaskins in the past but is not a surgical candidate.  We did perform ultrasound-guided right hip injection back on 04/28/2023, this does give him significant pain relief for a few months.  He also has manage on chronic pain medication which includes oxycodone HCL 10 mg 3 times daily as needed, he also receives morphine IR through his facility.  Also is on Lyrica 200 mg twice daily.  He does have significant pain despite all these medications, but injections do help his pain for a period of time. Would like to move forward with R-hip injection today.  *He is a type II diabetic, he does have his blood glucose checked at his facility.  Has done okay with previous corticosteroid injections.  Lab Results  Component Value Date   HGBA1C 7.0 (H) 09/24/2022   Pertinent ROS were reviewed with the patient and found to be negative unless otherwise specified above in HPI.   Assessment & Plan: Visit Diagnoses:  1. Primary osteoarthritis of both hips   2. Chronic right hip pain   3. Type 2 diabetes mellitus with morbid obesity (HCC)    Plan: Impression is chronic right hip pain with exacerbation of his bone-on-bone osteoarthritis.  He does have bilateral hip end-stage arthritic change, but is bedbound and is not a surgical candidate.  He has received relief from previous intra-articular  injections, his last was back in April of this past year.  Through shared decision making, we did proceed with ultrasound-guided right hip intra-articular injection, patient tolerated well.  He may use ice/heat or Tylenol for any postinjection pain.  He is on quite a bit of pain medication and he will continue his oxycodone 10 mg 3 times daily, his morphine IR as needed at his facility as well as Lyrica 200 mg twice daily.  He is diabetic although this is well-managed at his facility, did recommend checking POC glucose checks given possible transient rise from corticosteroid injection, although he has done well in the past without issues.  Recommended he continue working with PT or OT as able at his facility to help with partial ambulation under assistance.  Time was spent filling out his service records and facesheet with continuity of care paperwork.  This was sent with him back to [facility].  Did discuss reevaluating his contralateral hip, he will make an appointment for this in about 6 weeks.  Follow-up: Return in about 6 weeks (around 10/12/2023) for Schedule for US -guided L-hip injection (30-mins).   Meds & Orders: No orders of the defined types were placed in this encounter.   Orders Placed This Encounter  Procedures   Large Joint Inj   US  Guided Needle Placement - No Linked Charges     Procedures: Large Joint Inj: R hip joint on 08/31/2023 9:53 AM Indications: pain Details: 22 G 3.5 in needle, ultrasound-guided anterior approach Medications: 4 mL lidocaine  1 %; 40 mg methylPREDNISolone acetate 40 MG/ML Outcome: tolerated well, no immediate complications  Procedure: US -guided intra-articular hip injection, Right After discussion on risks/benefits/indications and informed verbal consent was obtained, a timeout was performed. Patient was lying supine on exam table. The hip was cleaned with betadine and alcohol swabs. Then utilizing ultrasound guidance, the patient's femoral head and neck  junction was identified and subsequently injected with 4:1 lidocaine:depomedrol via an in-plane approach with ultrasound visualization of the injectate administered into the hip joint. Patient tolerated procedure well without immediate complications.  Procedure, treatment alternatives, risks and benefits explained, specific risks discussed. Consent was given by the patient. Immediately prior to procedure a time out was called to verify the correct patient, procedure, equipment, support staff and site/side marked as required. Patient was prepped and draped in the usual sterile fashion.          Clinical History: No specialty comments available.  He reports that he has been smoking cigarettes. He has a 2.5 pack-year smoking history. He has never used smokeless tobacco.  Recent Labs    09/24/22 0608  HGBA1C 7.0*    Objective:    Physical Exam  Gen: Well-appearing, in no acute distress; non-toxic CV: Well-perfused. Warm.  Resp: Breathing unlabored on room air; no wheezing. Psych: Fluid speech in conversation; appropriate affect; normal thought process  Ortho Exam - Bilateral hips: There is no overlying skin breakdown.  No effusion noted of the hips.  Patient is nonmobile, presents today via EMS in a stretcher.  There is limited motion with logroll of both hips.  Imaging:  - 11/14/22: AP pelvis and lateral views of both hips: No acute fracture.  Both hips  well located.  Severe end-stage arthritis of the right hip with cystic  changes within the femoral head.  Left hip with bone-on-bone arthritic  changes with cystic changes within the femoral head.  No evidence of AVN  or acute fracture.   Past Medical/Family/Surgical/Social History: Medications & Allergies reviewed per EMR, new medications updated. Patient Active Problem List   Diagnosis Date Noted   Anemia, unspecified 11/16/2022   Lymphocytosis (symptomatic) 11/16/2022   Unable to care for self 09/29/2022   Unable to  ambulate 09/29/2022   FTT (failure to thrive) in adult 09/23/2022   Hypokalemia 09/23/2022   Cellulitis 06/18/2022   Candidal intertrigo 06/18/2022   Hyperosmolar hyperglycemic state (HHS) (HCC) 06/17/2022   Chronic, continuous use of opioids 06/17/2022   Hyponatremia 06/17/2022   Hypercapnia 06/17/2022   Generalized weakness 06/17/2022   Asthma 03/01/2022   Dyspnea on exertion 03/01/2022   Essential hypertension 03/01/2022   Hypercholesterolemia 03/01/2022   Mild persistent asthma without complication 02/15/2022   Class 3 severe obesity with serious comorbidity and body mass index (BMI) of 50.0 to 59.9 in adult 02/15/2022   Chronic thigh pain (Right) 12/14/2021    Class: Chronic   Class 3 obesity with alveolar hypoventilation, serious comorbidity, and body mass index (BMI) of 45.0 to 49.9 in adult (HCC) 11/04/2021   Epidural lipomatosis (L1-2 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) 11/03/2021   Lumbar facet arthropathy (Multilevel) (L1-2 through L5-S1) (Bilateral) 11/03/2021   Lumbar central spinal stenosis, 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 11/03/2021   Lumbar foraminal stenosis (Bilateral: L3-4, L4-5) (Left: L2-3) 11/03/2021   Lumbar lateral recess stenosis (Right: L3-4) (Bilateral: L4-5) 11/03/2021   Lumbar nerve root impingement (Right: L4) 11/03/2021   Herniated nucleus pulposus, L3-4 (Right) 11/03/2021   Hyperlipidemia associated with type 2 diabetes mellitus (HCC) 06/25/2021   Abnormal MRI, lumbar spine (05/21/2021)  05/24/2021   Chronic arthropathy 05/24/2021   Pes planus 05/24/2021   Osteoarthritis of AC (acromioclavicular) joint (Left) 04/29/2021   Osteoarthritis of shoulder (Left) 04/29/2021   Rotator cuff arthropathy of shoulder (Left) 04/29/2021   Lumbosacral facet hypertrophy (L4-5, L5-S1) 04/29/2021   Lumbar facet syndrome 04/29/2021   Abnormal MRI, hip (04/21/2021) 04/29/2021   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) 04/29/2021   Abnormal EMG (electromyogram) (03/16/2021) 04/29/2021    Polyneuropathy, 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   Diabetic sensorimotor polyneuropathy (HCC) 04/29/2021   Diabetic neuropathy (HCC) 04/26/2021   Osteoarthritis of hips (Bilateral) 03/29/2021   Osteoarthritis of hip (Left) 03/29/2021   Osteoarthritis of hip (Right) 03/29/2021   Osteoarthritis of knee (Left) 03/29/2021   Elevated sed rate 03/29/2021   Vitamin D deficiency 03/29/2021   Elevated C-reactive protein (CRP) 03/29/2021   Lumbosacral radiculopathy at L5 (Left) 03/29/2021   Fall (03/22/2021) 03/29/2021   Pharmacologic therapy 02/15/2021   Disorder of skeletal system 02/15/2021   Problems influencing health status 02/15/2021   Chronic use of opiate for therapeutic purpose 02/15/2021   Type 2 diabetes mellitus with morbid obesity (HCC) 02/15/2021   Chronic lower extremity pain (1ry area of Pain) (Bilateral) 02/15/2021   Chronic low back pain (4th area of Pain) (Bilateral) w/ sciatica (Bilateral) 02/15/2021   Chronic shoulder pain (5th area of Pain) (Left) 02/15/2021   Chronic hip pain (2ry area of Pain) (Bilateral) 02/15/2021   Chronic knee pain (3ry area of Pain) (Bilateral) 02/15/2021   Weakness of lower extremities (Bilateral) 01/15/2021   Recurrent falls 01/15/2021   Chronic pain syndrome 11/09/2020   DDD (degenerative disc disease), lumbar 11/09/2020   Primary osteoarthritis involving multiple joints 11/09/2020   Diabetes mellitus (HCC) 11/09/2020   Past Medical History:  Diagnosis Date   Arthritis    Diabetes mellitus without complication (HCC)    Hyperlipidemia    Hypertension    Family History  Problem Relation Age of Onset   Heart disease Mother    Arthritis Mother    Arthritis Father    Past Surgical History:  Procedure Laterality Date   APPENDECTOMY     COLONOSCOPY WITH PROPOFOL     COLONOSCOPY WITH PROPOFOL N/A 04/23/2021   Procedure: COLONOSCOPY WITH PROPOFOL;  Surgeon: Therisa Bi, MD;  Location: [facility] ENDOSCOPY;  Service: Gastroenterology;   Laterality: N/A;   Social History   Occupational History   Not on file  Tobacco Use   Smoking status: Some Days    Current packs/day: 0.10    Average packs/day: 0.1 packs/day for 25.0 years (2.5 ttl pk-yrs)    Types: Cigarettes   Smokeless tobacco: Never  Vaping Use   Vaping status: Never Used  Substance and Sexual Activity   Alcohol use: Never   Drug use: Never   Sexual activity: Not on file

## 2023-10-09 ENCOUNTER — Emergency Department (HOSPITAL_COMMUNITY)

## 2023-10-09 ENCOUNTER — Observation Stay (HOSPITAL_COMMUNITY)
Admission: EM | Admit: 2023-10-09 | Discharge: 2023-10-13 | Disposition: A | Discharge status: Skilled Nursing Facility | Attending: Internal Medicine | Admitting: Internal Medicine

## 2023-10-09 ENCOUNTER — Encounter (HOSPITAL_COMMUNITY): Payer: Self-pay

## 2023-10-09 DIAGNOSIS — K5900 Constipation, unspecified: Secondary | ICD-10-CM | POA: Insufficient documentation

## 2023-10-09 DIAGNOSIS — R5381 Other malaise: Secondary | ICD-10-CM | POA: Diagnosis not present

## 2023-10-09 DIAGNOSIS — R7989 Other specified abnormal findings of blood chemistry: Secondary | ICD-10-CM | POA: Diagnosis not present

## 2023-10-09 DIAGNOSIS — I110 Hypertensive heart disease with heart failure: Principal | ICD-10-CM | POA: Insufficient documentation

## 2023-10-09 DIAGNOSIS — M150 Primary generalized (osteo)arthritis: Secondary | ICD-10-CM

## 2023-10-09 DIAGNOSIS — G894 Chronic pain syndrome: Secondary | ICD-10-CM | POA: Diagnosis not present

## 2023-10-09 DIAGNOSIS — R600 Localized edema: Secondary | ICD-10-CM

## 2023-10-09 DIAGNOSIS — Z79891 Long term (current) use of opiate analgesic: Secondary | ICD-10-CM

## 2023-10-09 DIAGNOSIS — G4733 Obstructive sleep apnea (adult) (pediatric): Secondary | ICD-10-CM

## 2023-10-09 DIAGNOSIS — J9621 Acute and chronic respiratory failure with hypoxia: Secondary | ICD-10-CM | POA: Diagnosis not present

## 2023-10-09 DIAGNOSIS — R0602 Shortness of breath: Secondary | ICD-10-CM | POA: Diagnosis present

## 2023-10-09 DIAGNOSIS — R0689 Other abnormalities of breathing: Secondary | ICD-10-CM

## 2023-10-09 DIAGNOSIS — J9620 Acute and chronic respiratory failure, unspecified whether with hypoxia or hypercapnia: Secondary | ICD-10-CM | POA: Diagnosis not present

## 2023-10-09 DIAGNOSIS — E1142 Type 2 diabetes mellitus with diabetic polyneuropathy: Secondary | ICD-10-CM | POA: Insufficient documentation

## 2023-10-09 DIAGNOSIS — G8929 Other chronic pain: Secondary | ICD-10-CM | POA: Diagnosis present

## 2023-10-09 DIAGNOSIS — E662 Morbid (severe) obesity with alveolar hypoventilation: Secondary | ICD-10-CM | POA: Diagnosis not present

## 2023-10-09 DIAGNOSIS — Z86718 Personal history of other venous thrombosis and embolism: Secondary | ICD-10-CM | POA: Diagnosis not present

## 2023-10-09 DIAGNOSIS — G608 Other hereditary and idiopathic neuropathies: Secondary | ICD-10-CM | POA: Diagnosis present

## 2023-10-09 DIAGNOSIS — M129 Arthropathy, unspecified: Secondary | ICD-10-CM

## 2023-10-09 DIAGNOSIS — M5440 Lumbago with sciatica, unspecified side: Secondary | ICD-10-CM | POA: Diagnosis not present

## 2023-10-09 DIAGNOSIS — D72829 Elevated white blood cell count, unspecified: Secondary | ICD-10-CM | POA: Insufficient documentation

## 2023-10-09 DIAGNOSIS — I10 Essential (primary) hypertension: Secondary | ICD-10-CM | POA: Diagnosis present

## 2023-10-09 DIAGNOSIS — Z6841 Body Mass Index (BMI) 40.0 and over, adult: Secondary | ICD-10-CM | POA: Insufficient documentation

## 2023-10-09 DIAGNOSIS — Z7984 Long term (current) use of oral hypoglycemic drugs: Secondary | ICD-10-CM | POA: Diagnosis not present

## 2023-10-09 DIAGNOSIS — Z794 Long term (current) use of insulin: Secondary | ICD-10-CM | POA: Diagnosis not present

## 2023-10-09 DIAGNOSIS — Z7901 Long term (current) use of anticoagulants: Secondary | ICD-10-CM | POA: Diagnosis not present

## 2023-10-09 DIAGNOSIS — E7800 Pure hypercholesterolemia, unspecified: Secondary | ICD-10-CM | POA: Insufficient documentation

## 2023-10-09 DIAGNOSIS — E66813 Obesity, class 3: Secondary | ICD-10-CM | POA: Diagnosis not present

## 2023-10-09 DIAGNOSIS — I509 Heart failure, unspecified: Secondary | ICD-10-CM

## 2023-10-09 DIAGNOSIS — Z79899 Other long term (current) drug therapy: Secondary | ICD-10-CM | POA: Diagnosis not present

## 2023-10-09 DIAGNOSIS — R42 Dizziness and giddiness: Secondary | ICD-10-CM | POA: Diagnosis present

## 2023-10-09 DIAGNOSIS — R509 Fever, unspecified: Secondary | ICD-10-CM | POA: Diagnosis present

## 2023-10-09 DIAGNOSIS — M47816 Spondylosis without myelopathy or radiculopathy, lumbar region: Secondary | ICD-10-CM

## 2023-10-09 DIAGNOSIS — I5031 Acute diastolic (congestive) heart failure: Secondary | ICD-10-CM | POA: Diagnosis not present

## 2023-10-09 DIAGNOSIS — E1169 Type 2 diabetes mellitus with other specified complication: Secondary | ICD-10-CM

## 2023-10-09 LAB — COMPREHENSIVE METABOLIC PANEL WITH GFR
ALT: <5 U/L (ref 0–44)
AST: 11 U/L — ABNORMAL LOW (ref 15–41)
Albumin: 3.6 g/dL (ref 3.5–5.0)
Alkaline Phosphatase: 113 U/L (ref 38–126)
Anion gap: 9 (ref 5–15)
BUN: 9 mg/dL (ref 8–23)
CO2: 37 mmol/L — ABNORMAL HIGH (ref 22–32)
Calcium: 9 mg/dL (ref 8.9–10.3)
Chloride: 94 mmol/L — ABNORMAL LOW (ref 98–111)
Creatinine, Ser: 0.37 mg/dL — ABNORMAL LOW (ref 0.61–1.24)
GFR, Estimated: >60 mL/min (ref >60)
Glucose, Bld: 106 mg/dL — ABNORMAL HIGH (ref 70–99)
Potassium: 4.1 mmol/L (ref 3.5–5.1)
Sodium: 140 mmol/L (ref 135–145)
Total Bilirubin: 0.6 mg/dL (ref 0.0–1.2)
Total Protein: 6.1 g/dL — ABNORMAL LOW (ref 6.5–8.1)

## 2023-10-09 LAB — CBC WITH DIFFERENTIAL/PLATELET
Abs Immature Granulocytes: 0.05 K/uL (ref 0.00–0.07)
Basophils Absolute: 0 K/uL (ref 0.0–0.1)
Basophils Relative: 0 %
Eosinophils Absolute: 0.2 K/uL (ref 0.0–0.5)
Eosinophils Relative: 1 %
HCT: 45.6 % (ref 39.0–52.0)
Hemoglobin: 12.6 g/dL — ABNORMAL LOW (ref 13.0–17.0)
Immature Granulocytes: 0 %
Lymphocytes Relative: 16 %
Lymphs Abs: 1.9 K/uL (ref 0.7–4.0)
MCH: 27 pg (ref 26.0–34.0)
MCHC: 27.6 g/dL — ABNORMAL LOW (ref 30.0–36.0)
MCV: 97.9 fL (ref 80.0–100.0)
Monocytes Absolute: 0.8 K/uL (ref 0.1–1.0)
Monocytes Relative: 6 %
Neutro Abs: 9.2 K/uL — ABNORMAL HIGH (ref 1.7–7.7)
Neutrophils Relative %: 77 %
Platelets: 336 K/uL (ref 150–400)
RBC: 4.66 MIL/uL (ref 4.22–5.81)
RDW: 15.1 % (ref 11.5–15.5)
WBC: 12 K/uL — ABNORMAL HIGH (ref 4.0–10.5)
nRBC: 0 % (ref 0.0–0.2)

## 2023-10-09 LAB — URINALYSIS, ROUTINE W REFLEX MICROSCOPIC
Bacteria, UA: NONE SEEN
Bilirubin Urine: NEGATIVE
Glucose, UA: NEGATIVE mg/dL
Ketones, ur: NEGATIVE mg/dL
Leukocytes,Ua: NEGATIVE
Nitrite: NEGATIVE
Protein, ur: NEGATIVE mg/dL
Specific Gravity, Urine: 1.024 (ref 1.005–1.030)
pH: 5 (ref 5.0–8.0)

## 2023-10-09 LAB — RESP PANEL BY RT-PCR (RSV, FLU A&B, COVID)  RVPGX2
Influenza A by PCR: NEGATIVE
Influenza B by PCR: NEGATIVE
Resp Syncytial Virus by PCR: NEGATIVE
SARS Coronavirus 2 by RT PCR: NEGATIVE

## 2023-10-09 LAB — I-STAT CG4 LACTIC ACID, ED
Lactic Acid, Venous: 1.5 mmol/L (ref 0.5–1.9)
Lactic Acid, Venous: 0.8 mmol/L (ref 0.5–1.9)

## 2023-10-09 LAB — PROCALCITONIN: Procalcitonin: <0.1 ng/mL

## 2023-10-09 LAB — PRO BRAIN NATRIURETIC PEPTIDE: Pro Brain Natriuretic Peptide: <50 pg/mL (ref <300.0)

## 2023-10-09 LAB — TROPONIN T, HIGH SENSITIVITY
Troponin T High Sensitivity: 116 ng/L (ref 0–19)
Troponin T High Sensitivity: 139 ng/L (ref 0–19)
Troponin T High Sensitivity: 163 ng/L (ref 0–19)
Troponin T High Sensitivity: 160 ng/L (ref 0–19)
Troponin T High Sensitivity: 156 ng/L (ref 0–19)

## 2023-10-09 LAB — CBG MONITORING, ED: Glucose-Capillary: 106 mg/dL — ABNORMAL HIGH (ref 70–99)

## 2023-10-09 LAB — MAGNESIUM: Magnesium: 1.6 mg/dL — ABNORMAL LOW (ref 1.7–2.4)

## 2023-10-09 LAB — GLUCOSE, CAPILLARY: Glucose-Capillary: 110 mg/dL — ABNORMAL HIGH (ref 70–99)

## 2023-10-09 LAB — PHOSPHORUS: Phosphorus: 2.4 mg/dL — ABNORMAL LOW (ref 2.5–4.6)

## 2023-10-09 MED ORDER — TRAZODONE HCL 50 MG PO TABS
50.0000 mg | ORAL_TABLET | Freq: Every day | ORAL | Status: DC
Start: 2023-10-09 — End: 2023-10-13
  Administered 2023-10-09 – 2023-10-12 (×4): 50 mg via ORAL
  Filled 2023-10-09 (×4): qty 1

## 2023-10-09 MED ORDER — APIXABAN 5 MG PO TABS
5.0000 mg | ORAL_TABLET | Freq: Two times a day (BID) | ORAL | Status: DC
  Administered 2023-10-09 – 2023-10-13 (×8): 5 mg via ORAL
  Filled 2023-10-09 (×8): qty 1

## 2023-10-09 MED ORDER — VITAMIN D 25 MCG (1000 UNIT) PO TABS
2000.0000 [IU] | ORAL_TABLET | Freq: Every day | ORAL | Status: DC
  Administered 2023-10-09 – 2023-10-13 (×5): 2000 [IU] via ORAL
  Filled 2023-10-09 (×5): qty 2

## 2023-10-09 MED ORDER — CYCLOBENZAPRINE HCL 10 MG PO TABS
10.0000 mg | ORAL_TABLET | Freq: Three times a day (TID) | ORAL | Status: DC | PRN
  Administered 2023-10-10 – 2023-10-12 (×6): 10 mg via ORAL
  Filled 2023-10-09 (×6): qty 1

## 2023-10-09 MED ORDER — ONDANSETRON HCL 4 MG/2ML IJ SOLN: 4.0000 mg | Freq: Four times a day (QID) | INTRAMUSCULAR | Status: DC | PRN

## 2023-10-09 MED ORDER — LOSARTAN POTASSIUM 25 MG PO TABS
25.0000 mg | ORAL_TABLET | Freq: Every day | ORAL | Status: DC
  Administered 2023-10-09 – 2023-10-13 (×5): 25 mg via ORAL
  Filled 2023-10-09 (×5): qty 1

## 2023-10-09 MED ORDER — PREGABALIN 100 MG PO CAPS
200.0000 mg | ORAL_CAPSULE | Freq: Two times a day (BID) | ORAL | Status: DC
  Administered 2023-10-09 – 2023-10-13 (×8): 200 mg via ORAL
  Filled 2023-10-09 (×8): qty 2

## 2023-10-09 MED ORDER — ACETAMINOPHEN 325 MG PO TABS
650.0000 mg | ORAL_TABLET | Freq: Four times a day (QID) | ORAL | Status: DC | PRN
  Administered 2023-10-10: 650 mg via ORAL
  Filled 2023-10-09: qty 2

## 2023-10-09 MED ORDER — PRIMIDONE 50 MG PO TABS
50.0000 mg | ORAL_TABLET | Freq: Every day | ORAL | Status: DC
Start: 2023-10-09 — End: 2023-10-13
  Administered 2023-10-09 – 2023-10-12 (×4): 50 mg via ORAL
  Filled 2023-10-09 (×4): qty 1

## 2023-10-09 MED ORDER — FLUTICASONE FUROATE-VILANTEROL 200-25 MCG/ACT IN AEPB
1.0000 | INHALATION_SPRAY | Freq: Every day | RESPIRATORY_TRACT | Status: DC
  Administered 2023-10-09 – 2023-10-13 (×4): 1 via RESPIRATORY_TRACT
  Filled 2023-10-09: qty 28

## 2023-10-09 MED ORDER — ACETAMINOPHEN 650 MG RE SUPP: 650.0000 mg | Freq: Four times a day (QID) | RECTAL | Status: DC | PRN

## 2023-10-09 MED ORDER — VARENICLINE TARTRATE 0.5 MG PO TABS
1.0000 mg | ORAL_TABLET | Freq: Two times a day (BID) | ORAL | Status: DC
  Administered 2023-10-09 – 2023-10-13 (×8): 1 mg via ORAL
  Filled 2023-10-09 (×8): qty 2

## 2023-10-09 MED ORDER — MORPHINE SULFATE ER 15 MG PO TBCR
15.0000 mg | EXTENDED_RELEASE_TABLET | Freq: Two times a day (BID) | ORAL | Status: DC
  Administered 2023-10-09 – 2023-10-13 (×8): 15 mg via ORAL
  Filled 2023-10-09 (×8): qty 1

## 2023-10-09 MED ORDER — FUROSEMIDE 10 MG/ML IJ SOLN
40.0000 mg | Freq: Two times a day (BID) | INTRAMUSCULAR | Status: DC
  Administered 2023-10-09 – 2023-10-10 (×2): 40 mg via INTRAVENOUS
  Filled 2023-10-09 (×2): qty 4

## 2023-10-09 MED ORDER — ATORVASTATIN CALCIUM 40 MG PO TABS
40.0000 mg | ORAL_TABLET | Freq: Every day | ORAL | Status: DC
Start: 2023-10-09 — End: 2023-10-13
  Administered 2023-10-09 – 2023-10-13 (×5): 40 mg via ORAL
  Filled 2023-10-09 (×5): qty 1

## 2023-10-09 MED ORDER — METFORMIN HCL 500 MG PO TABS
1000.0000 mg | ORAL_TABLET | Freq: Two times a day (BID) | ORAL | Status: DC
Start: 2023-10-11 — End: 2023-10-10

## 2023-10-09 MED ORDER — IOHEXOL 350 MG/ML SOLN
75.0000 mL | Freq: Once | INTRAVENOUS | Status: AC | PRN
  Administered 2023-10-09: 75 mL via INTRAVENOUS

## 2023-10-09 MED ORDER — ONDANSETRON HCL 4 MG PO TABS: 4.0000 mg | ORAL_TABLET | Freq: Four times a day (QID) | ORAL | Status: DC | PRN

## 2023-10-09 MED ORDER — OXYCODONE HCL 5 MG PO TABS
10.0000 mg | ORAL_TABLET | Freq: Four times a day (QID) | ORAL | Status: DC | PRN
  Administered 2023-10-09 – 2023-10-13 (×9): 10 mg via ORAL
  Filled 2023-10-09 (×9): qty 2

## 2023-10-09 MED ORDER — FUROSEMIDE 10 MG/ML IJ SOLN: 40.0000 mg | Freq: Once | INTRAMUSCULAR | Status: DC

## 2023-10-09 MED ORDER — INSULIN ASPART 100 UNIT/ML IJ SOLN
0.0000 [IU] | Freq: Three times a day (TID) | INTRAMUSCULAR | Status: DC
  Administered 2023-10-10 – 2023-10-11 (×2): 3 [IU] via SUBCUTANEOUS
  Administered 2023-10-11: 4 [IU] via SUBCUTANEOUS
  Administered 2023-10-11: 6 [IU] via SUBCUTANEOUS
  Administered 2023-10-12 – 2023-10-13 (×4): 3 [IU] via SUBCUTANEOUS
  Filled 2023-10-09: qty 0.2

## 2023-10-09 MED ORDER — IPRATROPIUM-ALBUTEROL 0.5-2.5 (3) MG/3ML IN SOLN: 3.0000 mL | RESPIRATORY_TRACT | Status: DC | PRN

## 2023-10-09 NOTE — ED Triage Notes (Signed)
 Patient arrived with complaints of worsening trimmers and shortness of breath over the last three days. Wears 2L O2 at baseline. 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

## 2023-10-09 NOTE — ED Provider Notes (Signed)
 63 year old male with history of obesity who is on home oxygen at baseline presenting initially for shortness of breath and bodyaches.  The patient does have a low-grade fever.  Viral testing and repeat troponin are pending at time of signout.  Plan is for reassessment after repeat troponin.  The patient is on Eliquis  so suspicion for pulmonary embolism is low at this time.  The patient is relatively stable on his home oxygen. Physical Exam  BP (!) 125/94   Pulse 98   Temp (!) 100.6 F (38.1 C) (Rectal)   Resp 19   SpO2 90%   Physical Exam General: No acute distress, morbidly obese  Procedures  Procedures  ED Course / MDM   Clinical Course as of 10/09/23 0742  [facility] Oct 09, 2023  0658 Troponin T High Sensitivity(!!): 116 [CH]    Clinical Course User Index [CH] Horton, Charmaine FALCON, MD   Medical Decision Making Amount and/or Complexity of Data Reviewed Labs: ordered. Decision-making details documented in ED Course. Radiology: ordered.  Risk Prescription drug management. Decision regarding hospitalization.   The patient's troponins continue to increase but are relatively flat.  I discussed the patient's case with Dr. Francyne who recommends admission for echocardiogram and further trending of his troponins.  A call was placed to hospitalist service for admission.       Ula Prentice SAUNDERS, MD 10/09/23 [phone]

## 2023-10-09 NOTE — H&P (Signed)
 History and Physical    Patient: James [name] FMW:[id number] DOB: 1960/04/16 DOA: 10/09/2023 DOS: the patient was seen and examined on 10/09/2023 PCP: Edman Marsa PARAS, DO  Patient coming from: SNF  Chief Complaint:  Chief Complaint  Patient presents with   Shortness of Breath   HPI: James [name] is a 63 y.o. male with medical history significant of arthritis, diabetic peripheral neuropathy, type 2 diabetes, hyperlipidemia, hypertension, class III obesity hypokalemia, hyponatremia, lumbar central spinal stenosis, lumbar nerve root impingement, leukocytosis, recurrent falls, vitamin D  deficiency who was brought from his facility due to dyspnea, productive cough, fatigue for the past 3 days and also has bilateral lower extremity edema. He denied fever, chills, rhinorrhea, sore throat, wheezing or hemoptysis.  No chest pain, palpitations, diaphoresis, PND but has occasional orthopnea.  No abdominal pain, nausea, emesis, diarrhea, constipation, melena or hematochezia.  No flank pain, dysuria, frequency or hematuria.  No polyuria, polydipsia, polyphagia or blurred vision.   Lab work: Urinalysis shows small hemoglobin but was otherwise normal.  Coronavirus, influenza and RSV PCR was negative.  CBC showed a white count of 12.0 with 77% neutrophils, 16% lymphocytes, hemoglobin 12.6 g/dL platelets 663 (about a year ago hemoglobin was 14.2 g/dL).  Lactic acid was normal twice.  Troponin was 116 then 139 then 163 ng/L and proBNP was less than 50 pg/mL.  CMP showed a 740, potassium 4.1, chloride 94 and CO2 37 mmol/L with a normal anion gap.  Glucose 106, BUN 9 and creatinine 0.37 mg/dL.  Calcium  level was normal.  Total protein 6.1 g/dL and AST 11 units/L, the rest of the LFTs were normal.  Imaging: Portable 1 view chest radiograph with no acute cardiopulmonary process.  There were low lung volumes and cardiomegaly.  CTA chest no PE as Karian versus atelectasis throughout the posterior lungs.   Aortic atherosclerosis.   ED course: Initial vital signs were temperature 99.3 F, pulse 103, respiration 13, BP 100/86 mmHg O2 sat 94% on nasal cannula oxygen at 4 LPM.  The patient O2 sat has decreased below 90% twice while in the emergency department.  Review of Systems: As mentioned in the history of present illness. All other systems reviewed and are negative. Past Medical History:  Diagnosis Date   Arthritis    Diabetes mellitus without complication (HCC)    Hyperlipidemia    Hypertension    Past Surgical History:  Procedure Laterality Date   APPENDECTOMY     COLONOSCOPY WITH PROPOFOL      COLONOSCOPY WITH PROPOFOL  N/A 04/23/2021   Procedure: COLONOSCOPY WITH PROPOFOL ;  Surgeon: Therisa Bi, MD;  Location: [facility] ENDOSCOPY;  Service: Gastroenterology;  Laterality: N/A;   Social History:  reports that he has been smoking cigarettes. He has a 2.5 pack-year smoking history. He has never used smokeless tobacco. He reports that he does not drink alcohol and does not use drugs.  No Known Allergies  Family History  Problem Relation Age of Onset   Heart disease Mother    Arthritis Mother    Arthritis Father     Prior to Admission medications   Medication Sig Start Date End Date Taking? Authorizing Provider  albuterol  (VENTOLIN  HFA) 108 (90 Base) MCG/ACT inhaler INHALE 1 TO 2 PUFFS BY MOUTH EVERY 6 HOURS AS NEEDED FOR WHEEZING FOR SHORTNESS OF BREATH 05/04/22   Edman, Marsa PARAS, DO  atorvastatin  (LIPITOR) 40 MG tablet Take 1 tablet by mouth once daily 09/20/22   Edman, Marsa PARAS, DO  Blood Glucose Monitoring Suppl [facility] VERIO) w/Device  KIT Use to check blood sugar 1 x daily 06/28/22   Karamalegos, Marsa PARAS, DO  CHANTIX  1 MG tablet Take 1 mg by mouth 2 (two) times daily.    [provider]  cyclobenzaprine  (FLEXERIL ) 10 MG tablet Take 1 tablet (10 mg total) by mouth 3 (three) times daily as needed for muscle spasms. Patient not taking: Reported on 03/31/2023  09/29/22   Amin, Sumayya, MD  fluticasone -salmeterol (ADVAIR DISKUS) 250-50 MCG/ACT AEPB Inhale 1 puff into the lungs in the morning and at bedtime. 05/04/22   Karamalegos, Marsa PARAS, DO  furosemide  (LASIX ) 20 MG tablet TAKE 1 TABLET BY MOUTH ONCE DAILY AS NEEDED FOR FLUID OR EDEMA 03/15/22   Edman, Marsa PARAS, DO  Insulin  Pen Needle (PEN NEEDLES 3/16) 31G X 5 MM MISC 1 each by Does not apply route 4 (four) times daily -  before meals and at bedtime. 06/21/22   Jhonny Calvin NOVAK, MD  lactulose  (CHRONULAC ) 10 GM/15ML solution Take 45 mLs (30 g total) by mouth daily as needed for mild constipation. 09/29/22   Caleen Qualia, MD  loratadine  (CLARITIN ) 10 MG tablet Take 1 tablet (10 mg total) by mouth daily. 09/29/22   Amin, Sumayya, MD  losartan  (COZAAR ) 25 MG tablet Take 1 tablet by mouth once daily 09/20/22   Edman, Marsa PARAS, DO  metFORMIN  (GLUCOPHAGE ) 1000 MG tablet TAKE 1 TABLET BY MOUTH WITH BREAKFAST Patient not taking: Reported on 03/31/2023 09/20/22   Edman Marsa PARAS, DO  OneTouch Delica Lancets 33G MISC Use to check blood sugar 1 x daily 06/28/22   Edman, Marsa PARAS, DO  [facility] VERIO test strip Use to check blood sugar 1 x daily 06/28/22   Karamalegos, Marsa PARAS, DO  Oxycodone  HCl 10 MG TABS Take 1 tablet (10 mg total) by mouth every 8 (eight) hours as needed. Must last 30 days. 01/15/23 02/14/23  Tanya Glisson, MD  OZEMPIC , 1 MG/DOSE, 4 MG/3ML SOPN INJECT 1MG  INTO THE SKIN ONCE A WEEK 06/21/22   Jhonny Calvin B, MD  polyethylene glycol (MIRALAX  / GLYCOLAX ) 17 g packet Take 17 g by mouth 2 (two) times daily. 09/29/22   Caleen Qualia, MD  pregabalin  (LYRICA ) 200 MG capsule Take 1 capsule (200 mg total) by mouth 2 (two) times daily. 09/29/22   Caleen Qualia, MD    Physical Exam: Vitals:   10/09/23 0845 10/09/23 0900 10/09/23 0915 10/09/23 0957  BP:  (!) 135/93  132/83  Pulse: 97 85 85 86  Resp: 16 12 13 17   Temp:    97.8 F (36.6 C)  TempSrc:    Oral  SpO2:  91% (!) 89% 92% 95%   Physical Exam Vitals and nursing note reviewed.  Constitutional:      General: He is awake. He is not in acute distress.    Appearance: He is morbidly obese. He is ill-appearing.     Interventions: Nasal cannula in place.  HENT:     Head: Normocephalic.     Nose: No rhinorrhea.  Eyes:     General: No scleral icterus.    Pupils: Pupils are equal, round, and reactive to light.  Neck:     Vascular: No JVD.  Cardiovascular:     Rate and Rhythm: Normal rate and regular rhythm.     Heart sounds: S1 normal and S2 normal.  Pulmonary:     Effort: Pulmonary effort is normal.     Breath sounds: Examination of the right-lower field reveals decreased breath sounds and rales. Examination of  the left-lower field reveals decreased breath sounds and rales. Decreased breath sounds and rales present. No wheezing or rhonchi.  Abdominal:     General: Bowel sounds are normal. There is no distension.     Palpations: Abdomen is soft.     Tenderness: There is no abdominal tenderness. There is no right CVA tenderness or left CVA tenderness.  Musculoskeletal:     Cervical back: Neck supple.     Right lower leg: 3+ Pitting Edema present.     Left lower leg: 3+ Pitting Edema present.  Neurological:     Mental Status: He is alert and oriented to person, place, and time.  Psychiatric:        Mood and Affect: Mood normal.        Behavior: Behavior normal. Behavior is cooperative.     Data Reviewed:  Results are pending, will review when available. EKG: Vent. rate 101 BPM PR interval * ms QRS duration 80 ms QT/QTcB 333/432 ms P-R-T axes * 86 61 Normal sinus rhythm Borderline right axis deviation Low voltage, precordial leads ST elevation, consider inferior injury  Assessment and Plan: Principal Problem:   Acute on chronic respiratory failure with hypoxia (HCC) In the setting of:   Acute congestive heart failure (HCC)  Observation/telemetry. Supplemental oxygen as  needed. Sodium and fluid restriction. Continue furosemide  40 mg IVP twice daily. Monitor daily weights, intake and output. Monitor renal function electrolytes. Check echocardiogram. Cardiology will evaluate in a.m.  Active Problems:   Chronic low back pain (4th area of Pain) (Bilateral) w/ sciatica (Bilateral)   Polyneuropathy, peripheral sensorimotor axonal   Chronic pain syndrome Continue MS Contin  15 mg p.o. twice daily. Continue oxycodone  10 mg p.o. every 6 hours. Continue pregabalin  200 mg p.o. twice daily.    Type 2 diabetes mellitus with morbid obesity (HCC) Carbohydrate modified diet. CBG monitoring with RI SS. Check hemoglobin A1c. Continue metformin  1000 mg p.o. twice daily.    Essential hypertension Continue losartan  25 mg p.o. daily.    Class 3 obesity with alveolar hypoventilation, serious comorbidity,    and body mass index (BMI) of 45.0 to 49.9 in adult (HCC) Current BMI and weight are still pending. Would benefit from lifestyle modifications. Follow-up closely with PCP and/or bariatric clinic.      Advance Care Planning:   Code Status: Full Code   Consults: Cardiology (Vicenta Balding, MD).  Family Communication:   Severity of Illness: The appropriate patient status for this patient is OBSERVATION. Observation status is judged to be reasonable and necessary in order to provide the required intensity of service to ensure the patient's safety. The patient's presenting symptoms, physical exam findings, and initial radiographic and laboratory data in the context of their medical condition is felt to place them at decreased risk for further clinical deterioration. Furthermore, it is anticipated that the patient will be medically stable for discharge from the hospital within 2 midnights of admission.   Author: Alm Dorn Castor, MD 10/09/2023 12:13 PM  For on call review [email or web address].   This document was prepared using Dragon voice recognition software and  may contain some unintended transcription errors.

## 2023-10-09 NOTE — ED Notes (Signed)
 PT repositioned in bed per request.

## 2023-10-09 NOTE — ED Notes (Signed)
 PT incontinent of urine. PT rolled and clean brief applied.   Pt repositioned in bed.

## 2023-10-09 NOTE — Progress Notes (Signed)
   10/09/23 2313  BiPAP/CPAP/SIPAP  $ Non-Invasive Home Ventilator  Initial  $ Face Mask Medium Yes  BiPAP/CPAP/SIPAP Pt Type Adult  BiPAP/CPAP/SIPAP Resmed  Mask Type Full face mask  Dentures removed? Not applicable  Mask Size Medium  Patient Home Machine No  Patient Home Mask No  Patient Home Tubing No  Auto Titrate Yes  Minimum cmH2O 5 cmH2O  Maximum cmH2O 20 cmH2O  CPAP/SIPAP surface wiped down Yes  Device Plugged into RED Power Outlet Yes  BiPAP/CPAP /SiPAP Vitals  Resp 20  SpO2 96 %  Bilateral Breath Sounds Clear;Diminished  MEWS Score/Color  MEWS Score 0  MEWS Score Color Landy

## 2023-10-09 NOTE — ED Provider Notes (Signed)
 [facility]ERG[facility] DEPARTMENT AT [facility] Provider Note   CSN: [id number] Arrival date & time: 10/09/23  9568     Patient presents with: Shortness of Breath   James [name] is a 63 y.o. male.   HPI     This is a 63 year old male with known history of diabetes, hypertension, hyperlipidemia, DVT on Eliquis  who presents with reported hypoxia.  Per EMS report he has had acute on chronic tremors which seem to be worsening and increasing shortness of breath.  He wears oxygen at baseline but was noted to be in the mid 80s on his home baseline.  He is on Eliquis .  On my evaluation, patient complains of whole body pain.  Otherwise denies chest pain or shortness of breath.  Denies any recent fevers.  Prior to Admission medications   Medication Sig Start Date End Date Taking? Authorizing Provider  albuterol  (VENTOLIN  HFA) 108 (90 Base) MCG/ACT inhaler INHALE 1 TO 2 PUFFS BY MOUTH EVERY 6 HOURS AS NEEDED FOR WHEEZING FOR SHORTNESS OF BREATH 05/04/22   Edman, Marsa PARAS, DO  atorvastatin  (LIPITOR) 40 MG tablet Take 1 tablet by mouth once daily 09/20/22   Edman, Marsa PARAS, DO  Blood Glucose Monitoring Suppl (ONETOUCH VERIO) w/Device KIT Use to check blood sugar 1 x daily 06/28/22   Karamalegos, Marsa PARAS, DO  CHANTIX  1 MG tablet Take 1 mg by mouth 2 (two) times daily.    [provider]  cyclobenzaprine  (FLEXERIL ) 10 MG tablet Take 1 tablet (10 mg total) by mouth 3 (three) times daily as needed for muscle spasms. Patient not taking: Reported on 03/31/2023 09/29/22   Amin, Sumayya, MD  fluticasone -salmeterol (ADVAIR DISKUS) 250-50 MCG/ACT AEPB Inhale 1 puff into the lungs in the morning and at bedtime. 05/04/22   Karamalegos, Marsa PARAS, DO  furosemide  (LASIX ) 20 MG tablet TAKE 1 TABLET BY MOUTH ONCE DAILY AS NEEDED FOR FLUID OR EDEMA 03/15/22   Edman, Marsa PARAS, DO  Insulin  Pen Needle (PEN NEEDLES 3/16) 31G X 5 MM MISC 1 each by Does not apply route 4  (four) times daily -  before meals and at bedtime. 06/21/22   Jhonny Calvin NOVAK, MD  lactulose  (CHRONULAC ) 10 GM/15ML solution Take 45 mLs (30 g total) by mouth daily as needed for mild constipation. 09/29/22   Amin, Sumayya, MD  loratadine  (CLARITIN ) 10 MG tablet Take 1 tablet (10 mg total) by mouth daily. 09/29/22   Amin, Sumayya, MD  losartan  (COZAAR ) 25 MG tablet Take 1 tablet by mouth once daily 09/20/22   Edman, Marsa PARAS, DO  metFORMIN  (GLUCOPHAGE ) 1000 MG tablet TAKE 1 TABLET BY MOUTH WITH BREAKFAST Patient not taking: Reported on 03/31/2023 09/20/22   Edman Marsa PARAS, DO  OneTouch Delica Lancets 33G MISC Use to check blood sugar 1 x daily 06/28/22   Edman, Marsa PARAS, DO  [facility] VERIO test strip Use to check blood sugar 1 x daily 06/28/22   Karamalegos, Marsa PARAS, DO  Oxycodone  HCl 10 MG TABS Take 1 tablet (10 mg total) by mouth every 8 (eight) hours as needed. Must last 30 days. 01/15/23 02/14/23  Tanya Glisson, MD  OZEMPIC , 1 MG/DOSE, 4 MG/3ML SOPN INJECT 1MG  INTO THE SKIN ONCE A WEEK 06/21/22   Jhonny Calvin B, MD  polyethylene glycol (MIRALAX  / GLYCOLAX ) 17 g packet Take 17 g by mouth 2 (two) times daily. 09/29/22   Caleen Qualia, MD  pregabalin  (LYRICA ) 200 MG capsule Take 1 capsule (200 mg total) by mouth 2 (  two) times daily. 09/29/22   Caleen Qualia, MD    Allergies: Patient has no known allergies.    Review of Systems  Constitutional:  Negative for fever.  Respiratory:  Positive for shortness of breath.   Cardiovascular:  Negative for chest pain and leg swelling.  Gastrointestinal:  Negative for abdominal pain.  All other systems reviewed and are negative.   Updated Vital Signs BP 100/86   Pulse (!) 103   Temp (!) 100.6 F (38.1 C) (Rectal)   Resp 13   SpO2 94%   Physical Exam Vitals and nursing note reviewed.  Constitutional:      Appearance: He is well-developed.     Comments: Morbidly obese  HENT:     Head: Normocephalic and atraumatic.      Mouth/Throat:     Mouth: Mucous membranes are moist.  Eyes:     Pupils: Pupils are equal, round, and reactive to light.  Cardiovascular:     Rate and Rhythm: Regular rhythm. Tachycardia present.     Heart sounds: Normal heart sounds. No murmur heard. Pulmonary:     Effort: Pulmonary effort is normal. No respiratory distress.     Breath sounds: Normal breath sounds. No wheezing.     Comments: Distant breath sounds secondary to body habitus: Nasal cannula in place, no respiratory distress Abdominal:     General: Bowel sounds are normal.     Palpations: Abdomen is soft.     Tenderness: There is no abdominal tenderness. There is no rebound.  Musculoskeletal:     Cervical back: Neck supple.     Right lower leg: Edema present.     Left lower leg: Edema present.     Comments: 1+ pitting edema bilaterally  Lymphadenopathy:     Cervical: No cervical adenopathy.  Skin:    General: Skin is warm and dry.  Neurological:     Mental Status: He is alert.     Comments: Oriented to self and place but not time  Psychiatric:        Mood and Affect: Mood normal.     (all labs ordered are listed, but only abnormal results are displayed) Labs Reviewed  CBC WITH DIFFERENTIAL/PLATELET - Abnormal; Notable for the following components:      Result Value   WBC 12.0 (*)    Hemoglobin 12.6 (*)    MCHC 27.6 (*)    Neutro Abs 9.2 (*)    All other components within normal limits  COMPREHENSIVE METABOLIC PANEL WITH GFR - Abnormal; Notable for the following components:   Chloride 94 (*)    CO2 37 (*)    Glucose, Bld 106 (*)    Creatinine, Ser 0.37 (*)    Total Protein 6.1 (*)    AST 11 (*)    All other components within normal limits  URINALYSIS, ROUTINE W REFLEX MICROSCOPIC - Abnormal; Notable for the following components:   Hgb urine dipstick SMALL (*)    All other components within normal limits  TROPONIN T, HIGH SENSITIVITY - Abnormal; Notable for the following components:   Troponin T  High Sensitivity 116 (*)    All other components within normal limits  RESP PANEL BY RT-PCR (RSV, FLU A&B, COVID)  RVPGX2  PRO BRAIN NATRIURETIC PEPTIDE  I-STAT CG4 LACTIC ACID, ED  TROPONIN T, HIGH SENSITIVITY    EKG: EKG Interpretation Date/Time:  Monday October 09 2023 06:37:25 EDT Ventricular Rate:  99 PR Interval:  166 QRS Duration:  83 QT Interval:  346  QTC Calculation: 444 R Axis:   88  Text Interpretation: Sinus rhythm Borderline right axis deviation Low voltage, precordial leads Confirmed by Bari Pfeiffer ([id number]) on 10/09/2023 6:45:36 AM  Radiology: [facility] Chest Portable 1 View Result Date: 10/09/2023 EXAM: 1 VIEW XRAY OF THE CHEST 10/09/2023 06:28:00 AM COMPARISON: None available. CLINICAL HISTORY: SOB; hypoxia. Per chart - Patient arrived with complaints of worsening trimmers and shortness of breath over the last three days. Wears 2L O2 at baseline. 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 FINDINGS: LUNGS AND PLEURA: No focal pulmonary opacity. No pulmonary edema. No pleural effusion. No pneumothorax. Low lung volumes. HEART AND MEDIASTINUM: Cardiomegaly. BONES AND SOFT TISSUES: No acute osseous abnormality. IMPRESSION: 1. No acute cardiopulmonary process. 2. Cardiomegaly. 3. Low lung volumes. Electronically signed by: Waddell Calk MD 10/09/2023 06:32 AM EDT RP Workstation: [id number]     Procedures   Medications Ordered in the ED - No data to display  Clinical Course as of 10/09/23 0710  Mon Oct 09, 2023  0658 Troponin T High Sensitivity(!!): 116 [CH]    Clinical Course User Index [CH] Daija Routson, Pfeiffer FALCON, MD                                 Medical Decision Making Amount and/or Complexity of Data Reviewed Labs: ordered. Decision-making details documented in ED Course. Radiology: ordered.  Risk Prescription drug management. Decision regarding hospitalization.   This patient presents to the ED for concern of hypoxia, shortness of breath, this involves an extensive  number of treatment options, and is a complaint that carries with it a high risk of complications and morbidity.  I considered the following differential and admission for this acute, potentially life threatening condition.  The differential diagnosis includes viral syndrome, heart failure, pneumonia, OSA  MDM:    This is a 63 year old male who presents with concern for hypoxia and shortness of breath.  Really only has complaints of full body pain for me.  He is on Eliquis  so lower suspicion for PE.  He is on his nasal cannula and in no respiratory distress.  Exam is limited secondary to body habitus.  EKG shows no evidence of acute ischemia or arrhythmia.  Troponin is elevated.  No priors for baseline.  Not having any active chest pain.  He has a little bit of a white count.  No significant metabolic derangements.  Will trend troponin.  If trending upwards, may need cardiology evaluation.  (Labs, imaging, consults)  Labs: I Ordered, and personally interpreted labs.  The pertinent results include: CBC, CMP, troponin, lactic, urinalysis, COVID, flu  Imaging Studies ordered: I ordered imaging studies including chest x-ray I independently visualized and interpreted imaging. I agree with the radiologist interpretation  Additional history obtained from chart review.  External records from outside source obtained and reviewed including prior evaluations  Cardiac Monitoring: The patient was maintained on a cardiac monitor.  If on the cardiac monitor, I personally viewed and interpreted the cardiac monitored which showed an underlying rhythm of: Sinus  Reevaluation: After the interventions noted above, I reevaluated the patient and found that they have :stayed the same  Social Determinants of Health:  lives at a [facility] re[facility]  Disposition: Pending  Co morbidities that complicate the patient evaluation  Past Medical History:  Diagnosis Date   Arthritis    Diabetes mellitus without  complication (HCC)    Hyperlipidemia    Hypertension  Medicines Meds ordered this encounter  Medications   iohexol  (OMNIPAQUE ) 350 MG/ML injection 75 mL   apixaban  (ELIQUIS ) tablet 5 mg   atorvastatin  (LIPITOR) tablet 40 mg   varenicline  (CHANTIX ) tablet 1 mg   cyclobenzaprine  (FLEXERIL ) tablet 10 mg   cholecalciferol  (VITAMIN D3) 25 MCG (1000 UNIT) tablet 2,000 Units   fluticasone  furoate-vilanterol (BREO ELLIPTA ) 200-25 MCG/ACT 1 puff   losartan  (COZAAR ) tablet 25 mg   morphine  (MS CONTIN ) 12 hr tablet 15 mg    Refill:  0   oxyCODONE  (Oxy IR/ROXICODONE ) immediate release tablet 10 mg    Refill:  0    Must last 30 days.     pregabalin  (LYRICA ) capsule 200 mg   primidone  (MYSOLINE ) tablet 50 mg   traZODone  (DESYREL ) tablet 50 mg   metFORMIN  (GLUCOPHAGE ) tablet 1,000 mg   ipratropium-albuterol  (DUONEB) 0.5-2.5 (3) MG/3ML nebulizer solution 3 mL   OR Linked Order Group    acetaminophen  (TYLENOL ) tablet 650 mg    acetaminophen  (TYLENOL ) suppository 650 mg   OR Linked Order Group    ondansetron  (ZOFRAN ) tablet 4 mg    ondansetron  (ZOFRAN ) injection 4 mg   insulin  aspart (novoLOG ) injection 0-20 Units    Correction coverage::   Resistant (obese, steroids)    CBG < 70::   Implement Hypoglycemia Standing Orders and refer to Hypoglycemia Standing Orders sidebar report    CBG 70 - 120::   0 units    CBG 121 - 150::   3 units    CBG 151 - 200::   4 units    CBG 201 - 250::   7 units    CBG 251 - 300::   11 units    CBG 301 - 350::   15 units    CBG 351 - 400::   20 units    CBG > 400:   call MD and obtain STAT lab verification   DISCONTD: furosemide  (LASIX ) injection 40 mg   furosemide  (LASIX ) injection 40 mg    I have reviewed the patients home medicines and have made adjustments as needed  Problem List / ED Course: Problem List Items Addressed This Visit   None Visit Diagnoses       Lightheadedness    -  Primary     Elevated troponin                     Final diagnoses:  None    ED Discharge Orders     None          Darald Uzzle, Charmaine FALCON, MD 10/09/23 2300

## 2023-10-09 NOTE — ED Notes (Signed)
 PT incontinent of urine. Brief changed.

## 2023-10-10 ENCOUNTER — Observation Stay (HOSPITAL_COMMUNITY)

## 2023-10-10 ENCOUNTER — Other Ambulatory Visit: Payer: Self-pay

## 2023-10-10 ENCOUNTER — Encounter (HOSPITAL_COMMUNITY): Payer: Self-pay

## 2023-10-10 ENCOUNTER — Other Ambulatory Visit: Payer: Self-pay | Admitting: Physician Assistant

## 2023-10-10 DIAGNOSIS — E785 Hyperlipidemia, unspecified: Secondary | ICD-10-CM | POA: Diagnosis not present

## 2023-10-10 DIAGNOSIS — G4733 Obstructive sleep apnea (adult) (pediatric): Secondary | ICD-10-CM

## 2023-10-10 DIAGNOSIS — I5021 Acute systolic (congestive) heart failure: Secondary | ICD-10-CM | POA: Diagnosis not present

## 2023-10-10 DIAGNOSIS — I5032 Chronic diastolic (congestive) heart failure: Secondary | ICD-10-CM | POA: Diagnosis not present

## 2023-10-10 DIAGNOSIS — R0609 Other forms of dyspnea: Secondary | ICD-10-CM

## 2023-10-10 DIAGNOSIS — R7989 Other specified abnormal findings of blood chemistry: Secondary | ICD-10-CM | POA: Diagnosis not present

## 2023-10-10 DIAGNOSIS — I110 Hypertensive heart disease with heart failure: Secondary | ICD-10-CM | POA: Diagnosis not present

## 2023-10-10 DIAGNOSIS — J9621 Acute and chronic respiratory failure with hypoxia: Secondary | ICD-10-CM | POA: Diagnosis not present

## 2023-10-10 DIAGNOSIS — Z86718 Personal history of other venous thrombosis and embolism: Secondary | ICD-10-CM

## 2023-10-10 LAB — COMPREHENSIVE METABOLIC PANEL WITH GFR
ALT: <5 U/L (ref 0–44)
AST: 14 U/L — ABNORMAL LOW (ref 15–41)
Albumin: 3.7 g/dL (ref 3.5–5.0)
Alkaline Phosphatase: 115 U/L (ref 38–126)
Anion gap: 16 — ABNORMAL HIGH (ref 5–15)
BUN: 8 mg/dL (ref 8–23)
CO2: 30 mmol/L (ref 22–32)
Calcium: 9.6 mg/dL (ref 8.9–10.3)
Chloride: 91 mmol/L — ABNORMAL LOW (ref 98–111)
Creatinine, Ser: 0.39 mg/dL — ABNORMAL LOW (ref 0.61–1.24)
GFR, Estimated: >60 mL/min (ref >60)
Glucose, Bld: 119 mg/dL — ABNORMAL HIGH (ref 70–99)
Potassium: 3.7 mmol/L (ref 3.5–5.1)
Sodium: 137 mmol/L (ref 135–145)
Total Bilirubin: 1 mg/dL (ref 0.0–1.2)
Total Protein: 6.7 g/dL (ref 6.5–8.1)

## 2023-10-10 LAB — CBC
HCT: 47.7 % (ref 39.0–52.0)
Hemoglobin: 14.3 g/dL (ref 13.0–17.0)
MCH: 27.9 pg (ref 26.0–34.0)
MCHC: 30 g/dL (ref 30.0–36.0)
MCV: 93 fL (ref 80.0–100.0)
Platelets: 329 K/uL (ref 150–400)
RBC: 5.13 MIL/uL (ref 4.22–5.81)
RDW: 14.7 % (ref 11.5–15.5)
WBC: 10.7 K/uL — ABNORMAL HIGH (ref 4.0–10.5)
nRBC: 0 % (ref 0.0–0.2)

## 2023-10-10 LAB — HEMOGLOBIN A1C
Hgb A1c MFr Bld: 5.8 % — ABNORMAL HIGH (ref 4.8–5.6)
Mean Plasma Glucose: 119.76 mg/dL

## 2023-10-10 LAB — ECHOCARDIOGRAM COMPLETE
Area-P 1/2: 4.6 cm2
S' Lateral: 3.5 cm
Weight: 5661.41 [oz_av]

## 2023-10-10 LAB — BLOOD CULTURE ID PANEL (REFLEXED) - BCID2

## 2023-10-10 LAB — GLUCOSE, CAPILLARY
Glucose-Capillary: 98 mg/dL (ref 70–99)
Glucose-Capillary: 144 mg/dL — ABNORMAL HIGH (ref 70–99)
Glucose-Capillary: 119 mg/dL — ABNORMAL HIGH (ref 70–99)
Glucose-Capillary: 149 mg/dL — ABNORMAL HIGH (ref 70–99)

## 2023-10-10 LAB — HIV ANTIBODY (ROUTINE TESTING W REFLEX): HIV Screen 4th Generation wRfx: NONREACTIVE

## 2023-10-10 MED ORDER — BISACODYL 10 MG RE SUPP
10.0000 mg | Freq: Once | RECTAL | Status: DC
  Filled 2023-10-10: qty 1

## 2023-10-10 MED ORDER — PERFLUTREN LIPID MICROSPHERE
1.0000 mL | INTRAVENOUS | Status: AC | PRN
  Administered 2023-10-10: 5 mL via INTRAVENOUS

## 2023-10-10 MED ORDER — FUROSEMIDE 20 MG PO TABS
20.0000 mg | ORAL_TABLET | Freq: Every day | ORAL | Status: DC
  Administered 2023-10-10 – 2023-10-13 (×4): 20 mg via ORAL
  Filled 2023-10-10 (×4): qty 1

## 2023-10-10 MED ORDER — DOCUSATE SODIUM 100 MG PO CAPS
100.0000 mg | ORAL_CAPSULE | Freq: Two times a day (BID) | ORAL | Status: DC
  Administered 2023-10-11 – 2023-10-13 (×5): 100 mg via ORAL
  Filled 2023-10-10 (×7): qty 1

## 2023-10-10 MED ORDER — POLYETHYLENE GLYCOL 3350 17 G PO PACK
17.0000 g | PACK | Freq: Every day | ORAL | Status: DC
  Filled 2023-10-10 (×2): qty 1

## 2023-10-10 NOTE — Progress Notes (Signed)
 PHARMACY - PHYSICIAN COMMUNICATION CRITICAL VALUE ALERT - BLOOD CULTURE IDENTIFICATION (BCID)  James [name] is an 63 y.o. male who presented to [facility] on 10/09/2023 with a chief complaint of worsening shortness of breath.  Assessment:   Admit with acutte HF exacerbation and subsequent hypoxia.  1/4 BCx bottles (aerobic bottle of 1 set) growing GPCC; BCID + Staph species. Pt is afebrile, WBC trending down.   Name of physician (or Provider) Contacted: JINNY Kipper, NP  Current antibiotics: none  Changes to prescribed antibiotics recommended:  Likely contaminant- continue to monitor off antibiotics.   Results for orders placed or performed during the hospital encounter of 10/09/23  Blood Culture ID Panel (Reflexed) (Collected: 10/09/2023  7:50 PM)  Result Value Ref Range   Enterococcus faecalis NOT DETECTED NOT DETECTED   Enterococcus Faecium NOT DETECTED NOT DETECTED   Listeria monocytogenes NOT DETECTED NOT DETECTED   Staphylococcus species DETECTED (A) NOT DETECTED   Staphylococcus aureus (BCID) NOT DETECTED NOT DETECTED   Staphylococcus epidermidis NOT DETECTED NOT DETECTED   Staphylococcus lugdunensis NOT DETECTED NOT DETECTED   Streptococcus species NOT DETECTED NOT DETECTED   Streptococcus agalactiae NOT DETECTED NOT DETECTED   Streptococcus pneumoniae NOT DETECTED NOT DETECTED   Streptococcus pyogenes NOT DETECTED NOT DETECTED   A.calcoaceticus-baumannii NOT DETECTED NOT DETECTED   Bacteroides fragilis NOT DETECTED NOT DETECTED   Enterobacterales NOT DETECTED NOT DETECTED   Enterobacter cloacae complex NOT DETECTED NOT DETECTED   Escherichia coli NOT DETECTED NOT DETECTED   Klebsiella aerogenes NOT DETECTED NOT DETECTED   Klebsiella oxytoca NOT DETECTED NOT DETECTED   Klebsiella pneumoniae NOT DETECTED NOT DETECTED   Proteus species NOT DETECTED NOT DETECTED   Salmonella species NOT DETECTED NOT DETECTED   Serratia marcescens NOT DETECTED NOT DETECTED   Haemophilus  influenzae NOT DETECTED NOT DETECTED   Neisseria meningitidis NOT DETECTED NOT DETECTED   Pseudomonas aeruginosa NOT DETECTED NOT DETECTED   Stenotrophomonas maltophilia NOT DETECTED NOT DETECTED   Candida albicans NOT DETECTED NOT DETECTED   Candida auris NOT DETECTED NOT DETECTED   Candida glabrata NOT DETECTED NOT DETECTED   Candida krusei NOT DETECTED NOT DETECTED   Candida parapsilosis NOT DETECTED NOT DETECTED   Candida tropicalis NOT DETECTED NOT DETECTED   Cryptococcus neoformans/gattii NOT DETECTED NOT DETECTED    Rosaline Millet PharmD 10/10/2023  9:08 PM

## 2023-10-10 NOTE — Progress Notes (Signed)
 error

## 2023-10-10 NOTE — TOC Initial Note (Signed)
 Transition of Care [facility]) - Initial/Assessment Note   Patient Details  Name: James [name] MRN: [id number] Date of Birth: 1960-07-20  Transition of Care [facility]) CM/SW Contact:    Duwaine GORMAN Aran, LCSW Phone Number: 10/10/2023, 10:50 AM  Clinical Narrative: Patient is a LTC resident at [facility] and will return to the facility at discharge. FL2 started. Care management consulted for heart failure screening. Patient was referred to heart failure navigation team. Of note, patient has not had 3 ED visits or 3 hospital admissions in the past 6 months. Care management following.  Expected Discharge Plan: Long Term Nursing Home Barriers to Discharge: Continued Medical Work up  Patient Goals and CMS Choice Patient states their goals for this hospitalization and ongoing recovery are:: LTC at [facility]  Expected Discharge Plan and Services In-house Referral: Clinical Social Work Post Acute Care Choice: Nursing Home Living arrangements for the past 2 months: Skilled Nursing Facility            DME Arranged: N/A DME Agency: NA  Prior Living Arrangements/Services Living arrangements for the past 2 months: Skilled Nursing Facility Lives with:: Facility Resident Patient language and need for interpreter reviewed:: Yes Do you feel safe going back to the place where you live?: Yes      Need for Family Participation in Patient Care: No (Comment) Care giver support system in place?: Yes (comment) Criminal Activity/Legal Involvement Pertinent to Current Situation/Hospitalization: No - Comment as needed  Activities of Daily Living ADL Screening (condition at time of admission) Independently performs ADLs?: No Does the patient have a NEW difficulty with bathing/dressing/toileting/self-feeding that is expected to last >3 days?: Yes (Initiates electronic notice to provider for possible OT consult) Does the patient have a NEW difficulty with getting in/out of bed, walking, or climbing stairs that is  expected to last >3 days?: Yes (Initiates electronic notice to provider for possible PT consult) Does the patient have a NEW difficulty with communication that is expected to last >3 days?: No Is the patient deaf or have difficulty hearing?: No Does the patient have difficulty seeing, even when wearing glasses/contacts?: No Does the patient have difficulty concentrating, remembering, or making decisions?: No  Emotional Assessment Orientation: : Oriented to Self, Oriented to Place, Oriented to  Time, Oriented to Situation Alcohol / Substance Use: Not Applicable Psych Involvement: No (comment)  Admission diagnosis:  Lightheadedness [R42] Elevated troponin [R79.89] Acute on chronic respiratory failure with hypoxia (HCC) [J96.21] Patient Active Problem List   Diagnosis Date Noted   Acute congestive heart failure (HCC) 10/09/2023   Anemia, unspecified 11/16/2022   Lymphocytosis (symptomatic) 11/16/2022   Unable to care for self 09/29/2022   Unable to ambulate 09/29/2022   FTT (failure to thrive) in adult 09/23/2022   Hypokalemia 09/23/2022   Cellulitis 06/18/2022   Candidal intertrigo 06/18/2022   Hyperosmolar hyperglycemic state (HHS) (HCC) 06/17/2022   Chronic, continuous use of opioids 06/17/2022   Hyponatremia 06/17/2022   Hypercapnia 06/17/2022   Generalized weakness 06/17/2022   Asthma 03/01/2022   Dyspnea on exertion 03/01/2022   Essential hypertension 03/01/2022   Hypercholesterolemia 03/01/2022   Mild persistent asthma without complication 02/15/2022   Class 3 severe obesity with serious comorbidity and body mass index (BMI) of 50.0 to 59.9 in adult 02/15/2022   Chronic thigh pain (Right) 12/14/2021    Class: Chronic   Class 3 obesity with alveolar hypoventilation, serious comorbidity, and body mass index (BMI) of 45.0 to 49.9 in adult (HCC) 11/04/2021   Epidural lipomatosis (L1-2 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)  11/03/2021   Lumbar facet arthropathy (Multilevel) (L1-2 through L5-S1)  (Bilateral) 11/03/2021   Lumbar central spinal stenosis, 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 11/03/2021   Lumbar foraminal stenosis (Bilateral: L3-4, L4-5) (Left: L2-3) 11/03/2021   Lumbar lateral recess stenosis (Right: L3-4) (Bilateral: L4-5) 11/03/2021   Lumbar nerve root impingement (Right: L4) 11/03/2021   Herniated nucleus pulposus, L3-4 (Right) 11/03/2021   Hyperlipidemia associated with type 2 diabetes mellitus (HCC) 06/25/2021   Abnormal MRI, lumbar spine (05/21/2021) 05/24/2021   Chronic arthropathy 05/24/2021   Pes planus 05/24/2021   Osteoarthritis of AC (acromioclavicular) joint (Left) 04/29/2021   Osteoarthritis of shoulder (Left) 04/29/2021   Rotator cuff arthropathy of shoulder (Left) 04/29/2021   Lumbosacral facet hypertrophy (L4-5, L5-S1) 04/29/2021   Lumbar facet syndrome 04/29/2021   Abnormal MRI, hip (04/21/2021) 04/29/2021   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) 04/29/2021   Abnormal EMG (electromyogram) (03/16/2021) 04/29/2021   Polyneuropathy, 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   Diabetic sensorimotor polyneuropathy (HCC) 04/29/2021   Diabetic neuropathy (HCC) 04/26/2021   Osteoarthritis of hips (Bilateral) 03/29/2021   Osteoarthritis of hip (Left) 03/29/2021   Osteoarthritis of hip (Right) 03/29/2021   Osteoarthritis of knee (Left) 03/29/2021   Elevated sed rate 03/29/2021   Vitamin D  deficiency 03/29/2021   Elevated C-reactive protein (CRP) 03/29/2021   Lumbosacral radiculopathy at L5 (Left) 03/29/2021   Fall (03/22/2021) 03/29/2021   Pharmacologic therapy 02/15/2021   Disorder of skeletal system 02/15/2021   Problems influencing health status 02/15/2021   Chronic use of opiate for therapeutic purpose 02/15/2021   Type 2 diabetes mellitus with morbid obesity (HCC) 02/15/2021   Chronic lower extremity pain (1ry area of Pain) (Bilateral) 02/15/2021   Chronic low back pain (4th area of Pain) (Bilateral) w/ sciatica (Bilateral) 02/15/2021   Chronic shoulder  pain (5th area of Pain) (Left) 02/15/2021   Chronic hip pain (2ry area of Pain) (Bilateral) 02/15/2021   Chronic knee pain (3ry area of Pain) (Bilateral) 02/15/2021   Weakness of lower extremities (Bilateral) 01/15/2021   Recurrent falls 01/15/2021   Chronic pain syndrome 11/09/2020   DDD (degenerative disc disease), lumbar 11/09/2020   Primary osteoarthritis involving multiple joints 11/09/2020   Diabetes mellitus (HCC) 11/09/2020   PCP:  Edman Marsa PARAS, DO Pharmacy:   [facility] [address]), [place] - [address] [place] ([place]) [place] [address] Phone: [phone] Fax: [phone]  [facility] - [place], [place] - [address] [place] [address] Phone: [phone] Fax: [phone]  [id number] - [place], [place] - [address] [place] [address] Phone: [phone] Fax: [phone]  [facility] [address] [place] [address] Phone: [phone] Fax: [phone]  [facility] - [place], [place] - [address] [place] [address] Phone: [phone] Fax: [phone]  Social Drivers of Health (SDOH) Social History: SDOH Screenings   Food Insecurity: No Food Insecurity (10/10/2023)  Housing: Low Risk  (10/10/2023)  Transportation Needs: No Transportation Needs (10/10/2023)  Utilities: Not At Risk (10/10/2023)  Alcohol Screen: Low Risk  (03/31/2023)  Depression (PHQ2-9): Low Risk  (03/31/2023)  Financial Resource Strain: Low Risk  (03/31/2023)  Physical Activity: Inactive (03/31/2023)  Social Connections: Socially Isolated (03/31/2023)  Stress: No Stress Concern Present (03/31/2023)  Tobacco Use: High Risk (10/10/2023)  Health Literacy: Adequate Health Literacy (03/31/2023)   SDOH Interventions:    Readmission Risk Interventions  No data to display

## 2023-10-10 NOTE — Plan of Care (Signed)
   Problem: Education: Goal: Ability to describe self-care measures that may prevent or decrease complications (Diabetes Survival Skills Education) will improve Outcome: Progressing Goal: Individualized Educational Video(s) Outcome: Progressing   Problem: Coping: Goal: Ability to adjust to condition or change in health will improve Outcome: Progressing

## 2023-10-10 NOTE — Progress Notes (Signed)
 Heart Failure Navigator Progress Note  Assessed for Heart & Vascular TOC clinic readiness.  Patient does not meet criteria due to EF 65-70%, Per MD patient is mostly bed ridden and . based on chest x-ray and chest CT there was no evidence of edema and suspect that his worsening O2 requirements were more related to underlying obesity hypoventilation syndrome and not using CPAP therapy and not due to acute CHF . No HF TOC.   Navigator will sign off at this time.   Stephane Haddock, BSN, Scientist, clinical (histocompatibility and immunogenetics) Only

## 2023-10-10 NOTE — Consult Note (Addendum)
 Cardiology Consultation   Patient ID: James [name] MRN: [id number]; DOB: [birth year]  Admit date: 10/09/2023 Date of Consult: 10/10/2023  PCP:  James Marsa PARAS, DO   James [name] Cardiologist:  None        Patient Profile: James [name] is a 63 y.o. male with a hx of chronic pain syndrome, hx of DVT on Eliquis , type 2 diabetes with neuropathy, hypertension, asthma and arthritis who is being seen 10/10/2023 for the evaluation of suspected acute HF at the request of James Alstrom MD.  History of Present Illness: James [name] has no prior cardiac history.  He is suppose to wear 2 L of oxygen at baseline and CPAP at night, reported he is not compliant. He is unsure why he has oxygen requirement at baseline.  Patient presented to the ED on 9/22 for shortness of breath & tremors over the last 3 days. Nursing staff at his facility notedf hypoxia and increasing oxygen requirement.  In the ED patient was noted to be febrile [38.1 C], BP 125/94 and SpO2 100% on 4 L of [place] ECG showed sinus rhythm VR 99 Pertinent lab work: Creatinine 0.37   WBC 12 lactic acid 1.5 -> 0.8 negative Pro-Cal Troponin 116 -> 139 -> 163-> 160 -> 156 proBNP < 50    Mag 1.6   phosphorus 2.4 CXR showed cardiomegaly and low lung volumes though no acute pathology CTA chest showed no evidence of PE and aortic atherosclerosis  Patient was given 2 doses of IV Lasix  40 mg, with charted urine output 2 L  353.84 lbs on admission, patient shares he is unsure of his baseline weight.   On interview patient denied chest pain, shortness of breath, orthopnea, and change in appetite. He does note PND though does not wear his CPAP at home Patient's main complaint is body aches and his chronic pain. Denied recent illness.  He did share he has had a cough for months, though has improved. Productive -green or brown color. On day of arrival did feel lightheaded/dizzy.   He is not mobile at  baseline.  He reports his DVT was about 4 months ago. His facility handles his medications.    Past Medical History:  Diagnosis Date   Arthritis    Diabetes mellitus without complication (HCC)    Hyperlipidemia    Hypertension     Past Surgical History:  Procedure Laterality Date   APPENDECTOMY     COLONOSCOPY WITH PROPOFOL      COLONOSCOPY WITH PROPOFOL  N/A 04/23/2021   Procedure: COLONOSCOPY WITH PROPOFOL ;  Surgeon: James Bi, MD;  Location: [facility] ENDOSCOPY;  Service: Gastroenterology;  Laterality: N/A;       Scheduled Meds:  apixaban   5 mg Oral BID   atorvastatin   40 mg Oral Daily   cholecalciferol   2,000 Units Oral Daily   fluticasone  furoate-vilanterol  1 puff Inhalation Daily   furosemide   40 mg Intravenous BID   insulin  aspart  0-20 Units Subcutaneous TID WC   losartan   25 mg Oral Daily   [START ON 10/11/2023] metFORMIN   1,000 mg Oral BID WC   morphine   15 mg Oral Q12H   pregabalin   200 mg Oral BID   primidone   50 mg Oral QHS   traZODone   50 mg Oral QHS   varenicline   1 mg Oral BID   Continuous Infusions:  PRN Meds: acetaminophen  **OR** acetaminophen , cyclobenzaprine , ipratropium-albuterol , ondansetron  **OR** ondansetron  (ZOFRAN ) IV, oxyCODONE   Allergies:   No Known Allergies  Social History:  Social History   Socioeconomic History   Marital status: Single    Spouse name: Not on file   Number of children: Not on file   Years of education: Not on file   Highest education level: Not on file  Occupational History   Not on file  Tobacco Use   Smoking status: Some Days    Current packs/day: 0.10    Average packs/day: 0.1 packs/day for 25.0 years (2.5 ttl pk-yrs)    Types: Cigarettes   Smokeless tobacco: Never  Vaping Use   Vaping status: Never Used  Substance and Sexual Activity   Alcohol use: Never   Drug use: Never   Sexual activity: Not on file  Other Topics Concern   Not on file  Social History Narrative   Not on file   Social Drivers of  Health   Financial Resource Strain: Low Risk  (03/31/2023)   Overall Financial Resource Strain (CARDIA)    Difficulty of Paying Living Expenses: Not hard at all  Food Insecurity: No Food Insecurity (10/10/2023)   Hunger Vital Sign    Worried About Running Out of Food in the Last Year: Never true    Ran Out of Food in the Last Year: Never true  Transportation Needs: No Transportation Needs (10/10/2023)   PRAPARE - Administrator, Civil Service (Medical): No    Lack of Transportation (Non-Medical): No  Physical Activity: Inactive (03/31/2023)   Exercise Vital Sign    Days of Exercise per Week: 0 days    Minutes of Exercise per Session: 0 min  Stress: No Stress Concern Present (03/31/2023)   [employer] of Occupational Health - Occupational Stress Questionnaire    Feeling of Stress : Only a little  Social Connections: Socially Isolated (03/31/2023)   Social Connection and Isolation Panel    Frequency of Communication with Friends and Family: More than three times a week    Frequency of Social Gatherings with Friends and Family: Once a week    Attends Religious Services: Never    Database administrator or Organizations: No    Attends Banker Meetings: Never    Marital Status: Divorced  Catering manager Violence: Not At Risk (10/10/2023)   Humiliation, Afraid, Rape, and Kick questionnaire    Fear of Current or Ex-Partner: No    Emotionally Abused: No    Physically Abused: No    Sexually Abused: No    Family History:   Family History  Problem Relation Age of Onset   Heart disease Mother    Arthritis Mother    Arthritis Father      ROS:  Please see the history of present illness.  All other ROS reviewed and negative.     Physical Exam/Data: Vitals:   10/09/23 2313 10/10/23 0038 10/10/23 0600 10/10/23 0744  BP:   105/63   Pulse:   100   Resp: 20 17    Temp:   98 F (36.7 C)   TempSrc:   Oral   SpO2: 96%  92%   Weight:    (!) 160.5 kg     Intake/Output Summary (Last 24 hours) at 10/10/2023 0746 Last data filed at 10/10/2023 0600 Gross per 24 hour  Intake 220 ml  Output 2000 ml  Net -1780 ml      10/10/2023    7:44 AM 12/28/2022    9:07 AM 11/16/2022   10:46 AM  Last 3 Weights  Weight (lbs) 353 lb 13.4 oz 339  lb 339 lb  Weight (kg) 160.5 kg 153.769 kg 153.769 kg     Body mass index is 40.89 kg/m.  General:  Morbidly obese, chronically ill male in no acute distress on [place] HEENT: normal Neck: given body habitus unable to assess JVD Vascular: Distal pulses 2+ bilaterally Cardiac:  normal S1, S2; RRR; no murmur  Lungs: Diminished bilaterally, though no adventitious sounds Abd: soft, nontender, no hepatomegaly  Ext: trace pitting edema Musculoskeletal:  No deformities, BUE and BLE strength normal and equal Skin: warm and dry  Neuro:  CNs 2-12 intact, no focal abnormalities noted Psych:  Normal affect   EKG:  The EKG was personally reviewed and demonstrates:  see hpi Telemetry:  Telemetry was personally reviewed and demonstrates:  NSR HR avg ~90  Relevant CV Studies: None  Laboratory Data: Chemistry Recent Labs  Lab 10/09/23 0506 10/09/23 1950  NA 140  --   K 4.1  --   CL 94*  --   CO2 37*  --   GLUCOSE 106*  --   BUN 9  --   CREATININE 0.37*  --   CALCIUM  9.0  --   MG  --  1.6*  GFRNONAA >60  --   ANIONGAP 9  --     Recent Labs  Lab 10/09/23 0506  PROT 6.1*  ALBUMIN 3.6  AST 11*  ALT <5  ALKPHOS 113  BILITOT 0.6   Hematology Recent Labs  Lab 10/09/23 0506  WBC 12.0*  RBC 4.66  HGB 12.6*  HCT 45.6  MCV 97.9  MCH 27.0  MCHC 27.6*  RDW 15.1  PLT 336   BNP Recent Labs  Lab 10/09/23 0506  PROBNP <50.0     Radiology/Studies:  CT Angio Chest PE W and/or Wo Contrast Result Date: 10/09/2023 CLINICAL DATA:  Dyspnea for 3 days, oxygen supplementation at baseline. History of DVT on Eliquis . Pulmonary embolism (PE) suspected, high prob EXAM: CT ANGIOGRAPHY CHEST WITH CONTRAST  TECHNIQUE: Multidetector CT imaging of the chest was performed using the standard protocol during bolus administration of intravenous contrast. Multiplanar CT image reconstructions and MIPs were obtained to evaluate the vascular anatomy. RADIATION DOSE REDUCTION: This exam was performed according to the departmental dose-optimization program which includes automated exposure control, adjustment of the mA and/or kV according to patient size and/or use of iterative reconstruction technique. CONTRAST:  75mL OMNIPAQUE  IOHEXOL  350 MG/ML SOLN COMPARISON:  Chest radiograph from earlier today. FINDINGS: Cardiovascular: The study is moderate quality for the evaluation of pulmonary embolism, with some motion degradation. There are no filling defects in the central, lobar, segmental or subsegmental pulmonary artery branches to suggest acute pulmonary embolism. Atherosclerotic nonaneurysmal thoracic aorta. Normal caliber pulmonary arteries. Top normal heart size. No significant pericardial fluid/thickening. Mediastinum/Nodes: No significant thyroid nodules. Unremarkable esophagus. No pathologically enlarged axillary, mediastinal or hilar lymph nodes. Lungs/Pleura: No pneumothorax. No pleural effusion. Curvilinear thick bandlike scarring versus atelectasis throughout the posterior lungs. Consolidative airspace disease, lung masses or significant pulmonary nodules. Upper abdomen: No acute abnormality. Musculoskeletal: No aggressive appearing focal osseous lesions. Symmetric mild gynecomastia. Healed deformities in multiple posterior lower ribs bilaterally. Minimal thoracic spondylosis. Review of the MIP images confirms the above findings. IMPRESSION: 1. No pulmonary embolism. 2. Curvilinear thick bandlike scarring versus atelectasis throughout the posterior lungs. 3.  Aortic Atherosclerosis (ICD10-I70.0). Electronically Signed   By: Selinda DELENA Blue M.D.   On: 10/09/2023 10:29   DG Chest Portable 1 View Result Date:  10/09/2023 EXAM: 1 VIEW XRAY OF THE CHEST  10/09/2023 06:28:00 AM COMPARISON: None available. CLINICAL HISTORY: SOB; hypoxia. Per chart - Patient arrived with complaints of worsening trimmers and shortness of breath over the last three days. Wears 2L O2 at baseline. 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 FINDINGS: LUNGS AND PLEURA: No focal pulmonary opacity. No pulmonary edema. No pleural effusion. No pneumothorax. Low lung volumes. HEART AND MEDIASTINUM: Cardiomegaly. BONES AND SOFT TISSUES: No acute osseous abnormality. IMPRESSION: 1. No acute cardiopulmonary process. 2. Cardiomegaly. 3. Low lung volumes. Electronically signed by: Waddell Calk MD 10/09/2023 06:32 AM EDT RP Workstation: [id number]     Assessment and Plan: Acute on chronic hypoxic respiratory failure Pro BNP < 50. Obesity can falsely lower this value.  CXR and CTA showed no evidence of volume overload.  Net IO Since Admission: -1,540 mL [10/10/23 0840] Received IV diuresis. Given patient's body habitus volume assessment is difficult. At this time do no suspect he is volume up, will follow up on his lab-work this am that is pending. Given his de-conditioned state, Cr may be falsely low.  Echo pending  Unclear if patient has undergone a OHS study, suspect a major component of his hypoxia coupled with noncompliance with his home oxygen and CPAP. Patient certainly has risk factors for HFpEF, unsure if this is truly a HFpEF exacerbation at this time, he does not report HF symptoms and given his hypotension with IV diuresis may be dry. Will follow-up on results this afternoon and discuss with Dr. Shlomo.  Patient received am dose, will discontinue afternoon dose of IV lasix .   Elevated Troponin Trend above. Appears flat overall, though did peak to 160.  Denied chest pain. Patient is non-mobile at baseline, cannot assess for angina.  ECG showed no acute signs of ischemia.   Will follow-up on echo to assess for RWMA, however at this time do  not suspect ACS. Elevated troponin mostly likely demand ischemia  Hx of hypertension Hypotension BP: 105/63 Patient noted to have an episode of hypotension 80/54 yesterday evening, most likely 2/2 IV diuresis.  Continue losartan  25 mg with hold parameters.   Hyperlipidemia Repeat lipid panel in am Continue lipitor 40 mg  Hx of DVT Patient reports compliance through his facility.  CTA showed no PE.  Continue eliquis  5 mg BID  Per primary Chronic pain Constipation- no BM in 8 days  Leukocytosis/Febrile T2DM - A1c pending Obesity  Risk Assessment/Risk Scores:              For questions or updates, please contact [facility] Please consult [email or web address] for contact info under      Signed, Leontine LOISE Salen, PA-C  10/10/2023 7:46 AM

## 2023-10-10 NOTE — Hospital Course (Addendum)
 James [name] is a 63 y.o. male with medical history significant of arthritis, diabetic peripheral neuropathy, type 2 diabetes, hyperlipidemia, hypertension, class III obesity,, lumbar central spinal stenosis presented to hospital from facility with complaints of dyspnea and productive cough with fatigue for last 2 to 3 days with lower extremity edema.  In the ED patient had temperature of 99.3 F and was slightly tachycardic at 103.  Labs were notable for WBC elevation at 12.0 with 75% neutrophils.  Lactic acid of was normal.  Troponins were elevated at 116 followed by 139 and 163.  proBNP less than 50.  CMP showed creatinine of 0.3.  Urinalysis with small hemoglobin.  COVID influenza and RSV was negative.  Chest x-ray without any obvious infiltrate.  CTA of the chest did not show any pulmonary embolism but possible atelectasis.  Patient was then considered for admission to the hospital for further evaluation and treatment.  Acute on chronic respiratory failure with hypoxia secondary to  Acute congestive heart failure  Patient wears oxygen at baseline but was noted to be hypoxic with pulse ox of mid 80s at home.  Continue oxygen, strict intake and output charting Daily weights and IV Lasix .  Check 2D echocardiogram.  No previous 2D echocardiogram in the computer.  Cardiology has been consulted.  Continue telemetry monitoring.      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  Will hold.  Continue sliding scale insulin  and diabetic diet.  Check A1c    Essential hypertension Continue losartan      Class 3 obesity with alveolar hypoventilation,  Body mass index is 40.89 kg/m.  Would benefit from ongoing weight loss as outpatient.

## 2023-10-10 NOTE — Evaluation (Signed)
 Physical Therapy Brief Evaluation and Discharge Note Patient Details Name: James [name] MRN: [id number] DOB: 06/10/1960 Today's Date: 10/10/2023   History of Present Illness  63 yo male presents to therapy following hospital admission on 10/09/2033 secondary to progressive SO, productive cough and B LE edema. Pt found to have acute CHF.  Pt PMH includes but is not limited to: arthritis, DM II, HLD, HTN, asthma, neuropathy and chronic pain.  Clinical Impression   Patient evaluated by Physical Therapy with no further acute PT needs identified. All education has been completed and the patient has no further questions. Pt is total care and LTC resident at [facility]  and will return s/p hospitalization. PT is signing off. Thank you for this referral.        PT Assessment All further PT needs can be met in the next venue of care  Assistance Needed at Discharge  Frequent or constant Supervision/Assistance    Equipment Recommendations None recommended by PT  Recommendations for Other Services       Precautions/Restrictions Precautions Precautions: Fall Restrictions Weight Bearing Restrictions Per Provider Order: No        Mobility  Bed Mobility Rolling: Total assist        Transfers                   General transfer comment: total lift at baseline    Ambulation/Gait           General Gait Details: non ambulatory > 2024 with limited community and household navigation 2023  Home Activity Instructions    Stairs            Modified Rankin (Stroke Patients Only)        Balance                          Pertinent Vitals/Pain   Pain Assessment Pain Assessment: Faces Faces Pain Scale: Hurts whole lot Pain Location: B LE hips and knees with hx of pain maagement at pain clinic with severe OA and DJD Pain Descriptors / Indicators: Aching, Constant, Discomfort, Moaning Pain Intervention(s): Limited activity within patient's tolerance,  Monitored during session     Home Living               Additional Comments: [facility]    Prior Function        UE/LE Assessment               Communication   Communication Communication: No apparent difficulties     Cognition         General Comments      Exercises     Assessment/Plan    PT Problem List Decreased strength;Decreased range of motion;Decreased activity tolerance;Decreased balance;Decreased mobility;Decreased coordination;Pain       PT Visit Diagnosis Difficulty in walking, not elsewhere classified (R26.2);Pain    No Skilled PT     Co-evaluation                AMPAC 6 Clicks Help needed turning from your back to your side while in a flat bed without using bedrails?: Total Help needed moving from lying on your back to sitting on the side of a flat bed without using bedrails?: Total Help needed moving to and from a bed to a chair (including a wheelchair)?: Total Help needed standing up from a chair using your arms (e.g., wheelchair or bedside chair)?: Total Help needed to walk in hospital room?: Total Help  needed climbing 3-5 steps with a railing? : Total 6 Click Score: 6      End of Session Equipment Utilized During Treatment: Oxygen Activity Tolerance: Patient limited by pain Patient left: in bed;with call bell/phone within reach Nurse Communication: Mobility status;Need for lift equipment PT Visit Diagnosis: Difficulty in walking, not elsewhere classified (R26.2);Pain Pain - Right/Left:  (B) Pain - part of body: Leg;Knee;Hip     Time: 8290-8277 PT Time Calculation (min) (ACUTE ONLY): 13 min  Charges:   PT Evaluation $PT Eval Low Complexity: 1 Low      [place], PT Acute Rehab   [place] VEAR Drone  10/10/2023, 5:47 PM

## 2023-10-10 NOTE — Care Management Obs Status (Signed)
 MEDICARE OBSERVATION STATUS NOTIFICATION   Patient Details  Name: James [name] MRN: [id number] Date of Birth: 09/19/1960   Medicare Observation Status Notification Given:  Yes    Duwaine GORMAN Aran, LCSW 10/10/2023, 12:54 PM

## 2023-10-10 NOTE — Progress Notes (Signed)
 PROGRESS NOTE  James [name] FMW:[id number] DOB: February 10, 1960 DOA: 10/09/2023 PCP: Edman Marsa PARAS, DO   LOS: 0 days   Brief narrative:  James [name] is a 63 y.o. male with medical history significant of arthritis, diabetic peripheral neuropathy, type 2 diabetes, hyperlipidemia, hypertension, class III obesity,, lumbar central spinal stenosis presented to hospital from facility with complaints of dyspnea and productive cough with fatigue for last 2 to 3 days with lower extremity edema.  In the ED patient had temperature of 99.3 F and was slightly tachycardic at 103.  Labs were notable for WBC elevation at 12.0 with 75% neutrophils.  Lactic acid of was normal.  Troponins were elevated at 116 followed by 139 and 163.  proBNP less than 50.  CMP showed creatinine of 0.3.  Urinalysis with small hemoglobin.  COVID influenza and RSV was negative.  Chest x-ray without any obvious infiltrate.  CTA of the chest did not show any pulmonary embolism but possible atelectasis.  Patient was then considered for admission to the hospital for further evaluation and treatment.  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 Patient denies wearing oxygen at home but has CPAP.  But was noted to be hypoxic with pulse ox of mid 80s at home.  Continue oxygen, strict intake and output charting Daily weights and IV Lasix .  Check 2D echocardiogram to assess for regional wall motion abnormality..  No  previous 2D echocardiogram in the computer.  Cardiology has been consulted.  Troponins are high but flat.  EKG without any ischemic changes.  Continue telemetry monitoring.  Follow cardiology recommendation.  On IV Lasix .  Currently on 3 L of oxygen by nasal cannula.    Chronic low back pain (4th area of Pain) (Bilateral) w/ sciatica (Bilateral)   Polyneuropathy, peripheral sensorimotor axonal   Chronic pain syndrome Continue MS Contin  oxycodone ) and Lyrica .  Will get PT OT evaluation.  Patient is mostly bedbound at home.     Type 2 diabetes mellitus with morbid obesity (HCC) Metformin  at home.  Will hold.  Continue sliding scale insulin  and diabetic diet.  Check A1c closely monitor blood glucose levels.    Essential hypertension Continue losartan      Class 3 obesity with alveolar hypoventilation,  Body mass index is 40.89 kg/m.  Would benefit from ongoing weight loss as outpatient.  On CPAP at home.  Debility deconditioning weakness.  Will get physical therapy/occupational evaluation.  Constipation for 5 to 6 days.  Will consider Dulcolax suppository and Colace and MiraLAX .  History of DVT.  On Eliquis .  Will continue.  Mild leukocytosis.  Will continue to monitor closely.  Temperature max 100.6.  Blood cultures have been negative in less than 12 hours.  COVID influenza and urine RSV was negative.  Procalcitonin less than 0.10.  Urinalysis showed RBC but no white cells.  Lactate was not elevated.  Will continue to monitor.  DVT prophylaxis:  apixaban  (ELIQUIS ) tablet 5 mg   Disposition: Skilled nursing facility  Status is: Observation The patient will require  care spanning > 2 midnights and should be moved to inpatient because: Pending clinical improvement, IV diuretics, need for rehabilitation    Code Status:     Code Status: Full Code  Family Communication: None at bedside  Consultants: Cardiology  Procedures: None  Anti-infectives:  None none  Anti-infectives  (From admission, onward)    None       Subjective: Today, patient was seen and examined at bedside.  Patient states that he feels a little better with breathing today.  Has been having some diuresis.  States that he has not been able to ambulate much.  Denies any nausea vomiting abdominal pain.   Objective: Vitals:   10/10/23 0735 10/10/23 0845  BP:  136/78  Pulse:  86  Resp: 19   Temp:  98 F (36.7 C)  SpO2:  93%    Intake/Output Summary (Last 24 hours) at 10/10/2023 1013 Last data filed at 10/10/2023 0845 Gross per 24 hour  Intake 460 ml  Output 3700 ml  Net -3240 ml   Filed Weights   10/10/23 0744  Weight: (!) 160.5 kg   Body mass index is 47.99 kg/m.   Physical Exam: GENERAL: Patient is alert awake and oriented. Not in obvious distress.  Morbidly obese HENT: No scleral pallor or icterus. Pupils equally reactive to light. Oral mucosa is moist NECK: is supple, no gross swelling noted. CHEST: Clear to auscultation. No crackles or wheezes.  Diminished breath sounds bilaterally. CVS: S1 and S2 heard, no murmur. Regular rate and rhythm.  ABDOMEN: Soft, non-tender, obese abdomen bowel sounds are present. EXTREMITIES: Bilateral lower extremity edema noted CNS: Cranial nerves are intact. No focal motor deficits. SKIN: warm and dry without rashes.  Data Review: I have personally reviewed the following laboratory data and studies,  CBC: Recent Labs  Lab 10/09/23 0506 10/10/23 0806  WBC 12.0* 10.7*  NEUTROABS 9.2*  --   HGB 12.6* 14.3  HCT 45.6 47.7  MCV 97.9 93.0  PLT 336 329   Basic Metabolic Panel: Recent Labs  Lab 10/09/23 0506 10/09/23 1950  NA 140  --   K 4.1  --   CL 94*  --   CO2 37*  --   GLUCOSE 106*  --   BUN 9  --   CREATININE 0.37*  --   CALCIUM  9.0  --   MG  --  1.6*  PHOS  --  2.4*   Liver Function Tests: Recent Labs  Lab 10/09/23 0506  AST 11*  ALT <5  ALKPHOS 113  BILITOT 0.6  PROT 6.1*  ALBUMIN 3.6   No results for  input(s): LIPASE, AMYLASE in the last 168 hours. No results for input(s): AMMONIA in the last 168 hours. Cardiac Enzymes: No results for input(s): CKTOTAL, CKMB, CKMBINDEX, TROPONINI in the last 168 hours. BNP (last 3 results) No results for input(s): BNP in the last 8760 hours.  ProBNP (last 3 results) Recent Labs    10/09/23 0506  PROBNP <50.0    CBG: Recent Labs  Lab 10/09/23 1600 10/09/23 1759 10/10/23 0729  GLUCAP 106* 110* 98   Recent Results (from the past 240 hours)  Resp panel by RT-PCR (RSV, Flu A&B, Covid) Anterior Nasal Swab     Status: None   Collection Time: 10/09/23  6:39 AM   Specimen: Anterior Nasal Swab  Result Value Ref Range Status   SARS Coronavirus 2 by RT PCR NEGATIVE NEGATIVE Final    Comment: (NOTE) SARS-CoV-2 target nucleic acids are NOT  DETECTED.  The SARS-CoV-2 RNA is generally detectable in upper respiratory specimens during the acute phase of infection. The lowest concentration of SARS-CoV-2 viral copies this assay can detect is 138 copies/mL. A negative result does not preclude SARS-Cov-2 infection and should not be used as the sole basis for treatment or other patient management decisions. A negative result may occur with  improper specimen collection/handling, submission of specimen other than nasopharyngeal swab, presence of viral mutation(s) within the areas targeted by this assay, and inadequate number of viral copies(<138 copies/mL). A negative result must be combined with clinical observations, patient history, and epidemiological information. The expected result is Negative.  Fact Sheet for Patients:  BloggerCourse.com  Fact Sheet for Healthcare Providers:  SeriousBroker.it  This test is no t yet approved or cleared by the [place]  FDA and  has been authorized for detection and/or diagnosis of SARS-CoV-2 by FDA under an Emergency Use Authorization (EUA).  This EUA will remain  in effect (meaning this test can be used) for the duration of the COVID-19 declaration under Section 564(b)(1) of the Act, 21 U.S.C.section 360bbb-3(b)(1), unless the authorization is terminated  or revoked sooner.       Influenza A by PCR NEGATIVE NEGATIVE Final   Influenza B by PCR NEGATIVE NEGATIVE Final    Comment: (NOTE) The Xpert Xpress SARS-CoV-2/FLU/RSV plus assay is intended as an aid in the diagnosis of influenza from Nasopharyngeal swab specimens and should not be used as a sole basis for treatment. Nasal washings and aspirates are unacceptable for Xpert Xpress SARS-CoV-2/FLU/RSV testing.  Fact Sheet for Patients: BloggerCourse.com  Fact Sheet for Healthcare Providers: SeriousBroker.it  This test is not yet approved or cleared by the [place]  FDA and has been authorized for detection and/or diagnosis of SARS-CoV-2 by FDA under an Emergency Use Authorization (EUA). This EUA will remain in effect (meaning this test can be used) for the duration of the COVID-19 declaration under Section 564(b)(1) of the Act, 21 U.S.C. section 360bbb-3(b)(1), unless the authorization is terminated or revoked.     Resp Syncytial Virus by PCR NEGATIVE NEGATIVE Final    Comment: (NOTE) Fact Sheet for Patients: BloggerCourse.com  Fact Sheet for Healthcare Providers: SeriousBroker.it  This test is not yet approved or cleared by the [place]  FDA and has been authorized for detection and/or diagnosis of SARS-CoV-2 by FDA under an Emergency Use Authorization (EUA). This EUA will remain in effect (meaning this test can be used) for the duration of the COVID-19 declaration under Section 564(b)(1) of the Act, 21 U.S.C. section 360bbb-3(b)(1), unless the authorization is terminated or revoked.  Performed at [facility], [address], [place], [place] [address]   Blood culture (routine x 2)     Status: None (Preliminary result)   Collection Time: 10/09/23  2:50 PM   Specimen: BLOOD RIGHT HAND  Result Value Ref Range Status   Specimen Description   Final    BLOOD RIGHT HAND Performed at [facility], [address], [place], [place] [address]    Special Requests   Final    BOTTLES DRAWN AEROBIC AND ANAEROBIC Blood Culture results may not be optimal due to an inadequate volume of blood received in culture bottles Performed at [facility], [address] [place], [place] [address]    Culture   Final    NO GROWTH < 24 HOURS Performed at [facility], [address] [place], [place] [address]    Report  Status PENDING  Incomplete  Blood culture (routine x 2)     Status: None (Preliminary result)   Collection Time: 10/09/23  7:50 PM   Specimen: BLOOD RIGHT HAND  Result Value Ref Range Status   Specimen Description   Final    BLOOD RIGHT HAND Performed at [facility], [address] [place], [place] [address]    Special Requests   Final    BOTTLES DRAWN AEROBIC ONLY Blood Culture adequate volume Performed at [facility], [address], [place], [place] [address]    Culture   Final    NO GROWTH < 12 HOURS Performed at [facility], [address], [place], [place] [address]    Report Status PENDING  Incomplete     Studies: CT Angio Chest PE W and/or Wo Contrast Result Date: 10/09/2023 CLINICAL DATA:  Dyspnea for 3 days, oxygen supplementation at baseline. History of DVT on Eliquis . Pulmonary embolism (PE) suspected, high prob EXAM: CT ANGIOGRAPHY CHEST WITH CONTRAST TECHNIQUE: Multidetector CT imaging of the chest was performed using the standard protocol during bolus administration of intravenous contrast. Multiplanar CT image reconstructions and MIPs were obtained to evaluate the vascular anatomy. RADIATION DOSE REDUCTION: This exam was  performed according to the departmental dose-optimization program which includes automated exposure control, adjustment of the mA and/or kV according to patient size and/or use of iterative reconstruction technique. CONTRAST:  75mL OMNIPAQUE  IOHEXOL  350 MG/ML SOLN COMPARISON:  Chest radiograph from earlier today. FINDINGS: Cardiovascular: The study is moderate quality for the evaluation of pulmonary embolism, with some motion degradation. There are no filling defects in the central, lobar, segmental or subsegmental pulmonary artery branches to suggest acute pulmonary embolism. Atherosclerotic nonaneurysmal thoracic aorta. Normal caliber pulmonary arteries. Top normal heart size. No significant pericardial fluid/thickening. Mediastinum/Nodes: No significant thyroid nodules. Unremarkable esophagus. No pathologically enlarged axillary, mediastinal or hilar lymph nodes. Lungs/Pleura: No pneumothorax. No pleural effusion. Curvilinear thick bandlike scarring versus atelectasis throughout the posterior lungs. Consolidative airspace disease, lung masses or significant pulmonary nodules. Upper abdomen: No acute abnormality. Musculoskeletal: No aggressive appearing focal osseous lesions. Symmetric mild gynecomastia. Healed deformities in multiple posterior lower ribs bilaterally. Minimal thoracic spondylosis. Review of the MIP images confirms the above findings. IMPRESSION: 1. No pulmonary embolism. 2. Curvilinear thick bandlike scarring versus atelectasis throughout the posterior lungs. 3.  Aortic Atherosclerosis (ICD10-I70.0). Electronically Signed   By: Selinda DELENA Blue M.D.   On: 10/09/2023 10:29   DG Chest Portable 1 View Result Date: 10/09/2023 EXAM: 1 VIEW XRAY OF THE CHEST 10/09/2023 06:28:00 AM COMPARISON: None available. CLINICAL HISTORY: SOB; hypoxia. Per chart - Patient arrived with complaints of worsening trimmers and shortness of breath over the last three days. Wears 2L O2 at baseline. Hx of DVT currently on  Eliquis . FINDINGS: LUNGS AND PLEURA: No focal pulmonary opacity. No pulmonary edema. No pleural effusion. No pneumothorax. Low lung volumes. HEART AND MEDIASTINUM: Cardiomegaly. BONES AND SOFT TISSUES: No acute osseous abnormality. IMPRESSION: 1. No acute cardiopulmonary process. 2. Cardiomegaly. 3. Low lung volumes. Electronically signed by: Waddell Calk MD 10/09/2023 06:32 AM EDT RP Workstation: [id number]      Vernal Alstrom, MD  [facility] 10/10/2023  If 7PM-7AM, please contact night-coverage

## 2023-10-11 ENCOUNTER — Ambulatory Visit: Admitting: Sports Medicine

## 2023-10-11 DIAGNOSIS — G894 Chronic pain syndrome: Secondary | ICD-10-CM | POA: Diagnosis not present

## 2023-10-11 DIAGNOSIS — M5441 Lumbago with sciatica, right side: Secondary | ICD-10-CM

## 2023-10-11 DIAGNOSIS — R42 Dizziness and giddiness: Secondary | ICD-10-CM

## 2023-10-11 DIAGNOSIS — M25552 Pain in left hip: Secondary | ICD-10-CM

## 2023-10-11 DIAGNOSIS — I110 Hypertensive heart disease with heart failure: Secondary | ICD-10-CM | POA: Diagnosis not present

## 2023-10-11 DIAGNOSIS — M5442 Lumbago with sciatica, left side: Secondary | ICD-10-CM

## 2023-10-11 DIAGNOSIS — R7989 Other specified abnormal findings of blood chemistry: Secondary | ICD-10-CM | POA: Diagnosis not present

## 2023-10-11 DIAGNOSIS — G8929 Other chronic pain: Secondary | ICD-10-CM

## 2023-10-11 DIAGNOSIS — M160 Bilateral primary osteoarthritis of hip: Secondary | ICD-10-CM

## 2023-10-11 LAB — BASIC METABOLIC PANEL WITH GFR
Anion gap: 10 (ref 5–15)
BUN: 9 mg/dL (ref 8–23)
CO2: 35 mmol/L — ABNORMAL HIGH (ref 22–32)
Calcium: 8.9 mg/dL (ref 8.9–10.3)
Chloride: 94 mmol/L — ABNORMAL LOW (ref 98–111)
Creatinine, Ser: 0.45 mg/dL — ABNORMAL LOW (ref 0.61–1.24)
GFR, Estimated: >60 mL/min (ref >60)
Glucose, Bld: 116 mg/dL — ABNORMAL HIGH (ref 70–99)
Potassium: 3.5 mmol/L (ref 3.5–5.1)
Sodium: 139 mmol/L (ref 135–145)

## 2023-10-11 LAB — LIPID PANEL
Cholesterol: 90 mg/dL (ref 0–200)
HDL: 33 mg/dL — ABNORMAL LOW (ref >40)
LDL Cholesterol: 39 mg/dL (ref 0–99)
Total CHOL/HDL Ratio: 2.7 ratio
Triglycerides: 90 mg/dL (ref <150)
VLDL: 18 mg/dL (ref 0–40)

## 2023-10-11 LAB — CBC
HCT: 44.2 % (ref 39.0–52.0)
Hemoglobin: 13.4 g/dL (ref 13.0–17.0)
MCH: 27.9 pg (ref 26.0–34.0)
MCHC: 30.3 g/dL (ref 30.0–36.0)
MCV: 91.9 fL (ref 80.0–100.0)
Platelets: 327 K/uL (ref 150–400)
RBC: 4.81 MIL/uL (ref 4.22–5.81)
RDW: 15.1 % (ref 11.5–15.5)
WBC: 10 K/uL (ref 4.0–10.5)
nRBC: 0 % (ref 0.0–0.2)

## 2023-10-11 LAB — CULTURE, BLOOD (ROUTINE X 2): Special Requests: ADEQUATE

## 2023-10-11 LAB — PHOSPHORUS: Phosphorus: 2.7 mg/dL (ref 2.5–4.6)

## 2023-10-11 LAB — GLUCOSE, CAPILLARY
Glucose-Capillary: 126 mg/dL — ABNORMAL HIGH (ref 70–99)
Glucose-Capillary: 173 mg/dL — ABNORMAL HIGH (ref 70–99)
Glucose-Capillary: 149 mg/dL — ABNORMAL HIGH (ref 70–99)
Glucose-Capillary: 142 mg/dL — ABNORMAL HIGH (ref 70–99)

## 2023-10-11 LAB — MAGNESIUM: Magnesium: 1.7 mg/dL (ref 1.7–2.4)

## 2023-10-11 MED ORDER — DICLOFENAC SODIUM 1 % EX GEL
2.0000 g | Freq: Four times a day (QID) | CUTANEOUS | Status: DC
Start: 2023-10-12 — End: 2023-10-13
  Administered 2023-10-12 – 2023-10-13 (×5): 2 g via TOPICAL
  Filled 2023-10-11: qty 100

## 2023-10-11 NOTE — Progress Notes (Signed)
 [facility]                                                                              James [name], is a 63 y.o. male, DOB - 02/11/1960, FMW:[id number] Admit date - 10/09/2023    Outpatient Primary MD for the patient is Edman Marsa PARAS, DO  LOS - 0  days  Chief Complaint  Patient presents with   Shortness of Breath       Brief summary   Patient is a 63 year old male with arthritis, diabetic peripheral neuropathy, type 2 diabetes, hyperlipidemia, hypertension, class III obesity,, lumbar central spinal stenosis presented to hospital from facility with complaints of dyspnea and productive cough with fatigue for last 2 to 3 days with lower extremity edema. In the ED patient had temperature of 99.3 F and was slightly tachycardic at 103. Labs were notable for WBC elevation at 12.0 with 75% neutrophils. Lactic acid of was normal. Troponins were elevated at 116 followed by 139 and 163. proBNP less than 50. CMP showed creatinine of 0.3. Urinalysis with small hemoglobin. COVID influenza and RSV was negative. Chest x-ray without any obvious infiltrate. CTA of the chest did not show any pulmonary embolism but possible atelectasis.    Assessment & Plan    Acute  respiratory failure with hypoxia secondary to  acute diastolic CHF,  OSA, obesity hypoventilation - At baseline not on O2 at home but has CPAP, was hypoxic with pulse ox of 80s at home.   - Was followed by pulmonology in [place], moved to [address] [place] 2 years ago, has not had CPAP, will need CPAP at discharge. - Chronic lower extremity edema, received IV Lasix , negative balance of 5.36 L, O2 sats 94% on 2 L - Home O2 evaluation completed, will need to 2 L of O2  - 2D echo showed EF of 65 to 70%, no regional WMA, G1 DD - Per cardiology, continue Lasix  20 mg daily with additional 20 mg as needed for weight greater than 3 pounds in 1 day or 5 pounds in 1 week and lower extremity edema.   Elevated troponin secondary to  demand ischemia -In the setting of acute diastolic CHF, mildly elevated with flat trend, not consistent with ACS per cardiology. -2D echo showed no regional WMA, not a good cath candidate due to morbid obesity and bedridden  OSA, obesity hypoventilation -Will place ambulatory referral upon discharge follow outpatient sleep study/CPAP, has not followed with pulmonology since moving to [facility].     Chronic low back pain (4th area of Pain) (Bilateral) w/ sciatica (Bilateral)   Polyneuropathy, peripheral sensorimotor axonal   Chronic pain syndrome -Continue MS Contin  oxycodone ) and Lyrica .  Will get PT OT evaluation.  Patient is mostly bedbound at home.     Type 2 diabetes mellitus with morbid obesity (HCC) -Outpatient on metformin .  Hemoglobin A1c 5.8 -Continue scale insulin  while inpatient CBG (last 3)  Recent Labs    10/10/23 2050 10/11/23 0733 10/11/23 1149  GLUCAP 149* 126* 173*       Essential hypertension Continue losartan      Debility deconditioning weakness.  - PT evaluation   Constipation -  Continue bowel regimen    History of DVT.  On Eliquis .  Will continue.   Mild leukocytosis.  -Resolved - UA negative for UTI, COVID, flu, RSV negative - Blood cultures Staph hominis, likely contaminant     Obesity class III Estimated body mass index is 47.84 kg/m as calculated from the following:   Height as of this encounter: 6' (1.829 m).   Weight as of this encounter: 160 kg.  Code Status: Full code DVT Prophylaxis:   apixaban  (ELIQUIS ) tablet 5 mg   Level of Care: Level of care: Telemetry Family Communication: Updated patient Disposition Plan:      Remains inpatient appropriate: Possible DC to facility tomorrow   Procedures:    Consultants:   Cardiology  Antimicrobials:   Anti-infectives (From admission, onward)    None          Medications  apixaban   5 mg Oral BID   atorvastatin   40 mg Oral Daily   bisacodyl   10 mg Rectal Once   cholecalciferol    2,000 Units Oral Daily   docusate sodium   100 mg Oral BID   fluticasone  furoate-vilanterol  1 puff Inhalation Daily   furosemide   20 mg Oral Daily   insulin  aspart  0-20 Units Subcutaneous TID WC   losartan   25 mg Oral Daily   morphine   15 mg Oral Q12H   polyethylene glycol  17 g Oral Daily   pregabalin   200 mg Oral BID   primidone   50 mg Oral QHS   traZODone   50 mg Oral QHS   varenicline   1 mg Oral BID      Subjective:   James [name] was seen and examined today.  Complaining of knee pains, hip pains, has chronic pain issues states bone-on-bone and needs hip replacement outpatient.  No acute chest pain, shortness of breath, abdominal pain, N/V. No acute events overnight.    Objective:   Vitals:   10/11/23 0005 10/11/23 0500 10/11/23 0534 10/11/23 1320  BP:   (!) 140/82 114/76  Pulse: 97  87 90  Resp: (!) 21  17 20   Temp:   98.8 F (37.1 C) 98.3 F (36.8 C)  TempSrc:    Oral  SpO2:   92% 94%  Weight:  (!) 160 kg    Height:        Intake/Output Summary (Last 24 hours) at 10/11/2023 1405 Last data filed at 10/11/2023 0855 Gross per 24 hour  Intake 480 ml  Output 2100 ml  Net -1620 ml     Wt Readings from Last 3 Encounters:  10/11/23 (!) 160 kg  12/28/22 (!) 153.8 kg  11/16/22 (!) 153.8 kg     Exam General: Alert and oriented x 3, NAD Cardiovascular: S1 S2 auscultated,  RRR Respiratory: Clear to auscultation bilaterally, no wheezing Gastrointestinal: Obese, soft, nontender, nondistended, + bowel sounds Ext: Trace pedal edema bilaterally Neuro: No new deficits Skin: No rashes Psych: Normal affect     Data Reviewed:  I have personally reviewed following labs    CBC Lab Results  Component Value Date   WBC 10.0 10/11/2023   RBC 4.81 10/11/2023   HGB 13.4 10/11/2023   HCT 44.2 10/11/2023   MCV 91.9 10/11/2023   MCH 27.9 10/11/2023   PLT 327 10/11/2023   MCHC 30.3 10/11/2023   RDW 15.1 10/11/2023   LYMPHSABS 1.9 10/09/2023   MONOABS 0.8  10/09/2023   EOSABS 0.2 10/09/2023   BASOSABS 0.0 10/09/2023     Last metabolic panel Lab  Results  Component Value Date   NA 139 10/11/2023   K 3.5 10/11/2023   CL 94 (L) 10/11/2023   CO2 35 (H) 10/11/2023   BUN 9 10/11/2023   CREATININE 0.45 (L) 10/11/2023   GLUCOSE 116 (H) 10/11/2023   GFRNONAA >60 10/11/2023   CALCIUM  8.9 10/11/2023   PHOS 2.7 10/11/2023   PROT 6.7 10/10/2023   ALBUMIN 3.7 10/10/2023   LABGLOB 2.9 02/15/2021   AGRATIO 1.4 02/15/2021   BILITOT 1.0 10/10/2023   ALKPHOS 115 10/10/2023   AST 14 (L) 10/10/2023   ALT <5 10/10/2023   ANIONGAP 10 10/11/2023    CBG (last 3)  Recent Labs    10/10/23 2050 10/11/23 0733 10/11/23 1149  GLUCAP 149* 126* 173*      Coagulation Profile: No results for input(s): INR, PROTIME in the last 168 hours.   Radiology Studies: I have personally reviewed the imaging studies  ECHOCARDIOGRAM COMPLETE Result Date: 10/10/2023    ECHOCARDIOGRAM REPORT   Patient Name:   James [name] Date of Exam: 10/10/2023 Medical Rec #:  [id number]         Height:       78.0 in Accession #:    [id number]        Weight:       353.8 lb Date of Birth:  [birth year]         BSA:          2.877 m Patient Age:    63 years          BP:           136/78 mmHg Patient Gender: M                 HR:           82 bpm. Exam Location:  Inpatient Procedure: 2D Echo, Cardiac Doppler, Color Doppler and Intracardiac            Opacification Agent (Both Spectral and Color Flow Doppler were            utilized during procedure). Indications:    Dyspnea  History:        Patient has no prior history of Echocardiogram examinations.                 CHF; Risk Factors:Diabetes, Hypertension and Dyslipidemia.  Sonographer:    Philomena Daring Referring Phys: [id number] DAVID MANUEL ORTIZ  Sonographer Comments: Technically challenging study due to limited acoustic windows, Technically difficult study due to poor echo windows, suboptimal apical window and patient is obese. Image  acquisition challenging due to patient body habitus. IMPRESSIONS  1. Left ventricular ejection fraction, by estimation, is 65 to 70%. The left ventricle has normal function. The left ventricle has no regional wall motion abnormalities. Left ventricular diastolic parameters are consistent with Grade I diastolic dysfunction (impaired relaxation).  2. Right ventricular systolic function is normal. The right ventricular size is normal.  3. The mitral valve was not well visualized. No evidence of mitral valve regurgitation. No evidence of mitral stenosis.  4. The aortic valve was not well visualized. Aortic valve regurgitation is not visualized. No aortic stenosis is present.  5. This is a technically challenging study. Comparison(s): No prior Echocardiogram. FINDINGS  Left Ventricle: Left ventricular ejection fraction, by estimation, is 65 to 70%. The left ventricle has normal function. The left ventricle has no regional wall motion abnormalities. Definity  contrast agent was given IV to delineate the left ventricular  endocardial borders. The  left ventricular internal cavity size was normal in size. Suboptimal image quality limits for assessment of left ventricular hypertrophy. Left ventricular diastolic parameters are consistent with Grade I diastolic dysfunction (impaired relaxation). Right Ventricle: The right ventricular size is normal. No increase in right ventricular wall thickness. Right ventricular systolic function is normal. Left Atrium: Left atrial size was normal in size. Right Atrium: Right atrial size was normal in size. Pericardium: There is no evidence of pericardial effusion. Presence of epicardial fat layer. Mitral Valve: The mitral valve was not well visualized. No evidence of mitral valve regurgitation. No evidence of mitral valve stenosis. Tricuspid Valve: The tricuspid valve is not well visualized. Tricuspid valve regurgitation is not demonstrated. No evidence of tricuspid stenosis. Aortic Valve:  The aortic valve was not well visualized. Aortic valve regurgitation is not visualized. No aortic stenosis is present. Pulmonic Valve: The pulmonic valve was not well visualized. Pulmonic valve regurgitation is not visualized. No evidence of pulmonic stenosis. Aorta: The aortic root is normal in size and structure. IAS/Shunts: No atrial level shunt detected by color flow Doppler.  LEFT VENTRICLE PLAX 2D LVIDd:         5.30 cm   Diastology LVIDs:         3.50 cm   LV e' lateral:   5.66 cm/s LV PW:         1.20 cm   LV E/e' lateral: 13.5 LV IVS:        1.30 cm LVOT diam:     2.50 cm LVOT Area:     4.91 cm  RIGHT VENTRICLE             IVC RV S prime:     16.90 cm/s  IVC diam: 2.00 cm TAPSE (M-mode): 2.3 cm LEFT ATRIUM           Index LA diam:      2.90 cm 1.01 cm/m LA Vol (A4C): 73.3 ml 25.48 ml/m   AORTA Ao Root diam: 3.40 cm MITRAL VALVE               TRICUSPID VALVE MV Area (PHT): 4.60 cm    TR Peak grad:   6.7 mmHg MV Decel Time: 165 msec    TR Vmax:        129.00 cm/s MV E velocity: 76.60 cm/s MV A velocity: 92.50 cm/s  SHUNTS MV E/A ratio:  0.83        Systemic Diam: 2.50 cm Stanly Leavens MD Electronically signed by Stanly Leavens MD Signature Date/Time: 10/10/2023/10:47:51 AM    Final        Nydia Distance M.D. [facility] 10/11/2023, 2:05 PM  Available via Epic secure chat 7am-7pm After 7 pm, please refer to night coverage provider listed on amion.

## 2023-10-11 NOTE — Progress Notes (Signed)
   10/11/23 2337  BiPAP/CPAP/SIPAP  BiPAP/CPAP/SIPAP Pt Type Adult  BiPAP/CPAP/SIPAP Resmed  Mask Type Full face mask  Dentures removed? Not applicable  Mask Size Medium  Flow Rate 2 lpm  Patient Home Machine No  Patient Home Mask No  Patient Home Tubing No  Auto Titrate Yes  Minimum cmH2O 5 cmH2O  Maximum cmH2O 20 cmH2O  Device Plugged into RED Power Outlet Yes  BiPAP/CPAP /SiPAP Vitals  Pulse Rate 89  Resp (!) 27  SpO2 94 %  Bilateral Breath Sounds Clear;Diminished  MEWS Score/Color  MEWS Score 2  MEWS Score Color Yellow

## 2023-10-11 NOTE — Progress Notes (Signed)
   10/11/23 0005  BiPAP/CPAP/SIPAP  $ Non-Invasive Home Ventilator  Subsequent  BiPAP/CPAP/SIPAP Pt Type Adult  BiPAP/CPAP/SIPAP Resmed  Mask Type Full face mask  Mask Size Medium  Flow Rate 2 lpm  Patient Home Machine No  Patient Home Mask No  Patient Home Tubing No  Auto Titrate Yes  Minimum cmH2O 5 cmH2O  Maximum cmH2O 20 cmH2O  Device Plugged into RED Power Outlet Yes  BiPAP/CPAP /SiPAP Vitals  Pulse Rate 97  Resp (!) 21  MEWS Score/Color  MEWS Score 1  MEWS Score Color Green

## 2023-10-11 NOTE — Progress Notes (Unsigned)
 No show for appointment

## 2023-10-11 NOTE — Evaluation (Signed)
 Occupational Therapy Evaluation Patient Details Name: James [name] MRN: [id number] DOB: [birth year] Today's Date: 10/11/2023   History of Present Illness   63 yo male presents to therapy following hospital admission on 10/09/2033 secondary to progressive SO, productive cough and B LE edema. Pt found to have acute CHF.  Pt PMH includes but is not limited to: arthritis, DM II, HLD, HTN, asthma, neuropathy and chronic pain.     Clinical Impressions Patient evaluated by Occupational Therapy with no further acute OT needs identified. All education has been completed and the patient has no further questions. Educated on pacing/breathing strategies, positioning and light UE tband HEP with + teach back.  See below for any follow-up Occupational Therapy or equipment needs. OT is signing off. Thank you for this referral.      If plan is discharge home, recommend the following:   Two people to help with walking and/or transfers;A lot of help with bathing/dressing/bathroom;Assistance with cooking/housework;Assist for transportation;Help with stairs or ramp for entrance     Functional Status Assessment   Patient has had a recent decline in their functional status and demonstrates the ability to make significant improvements in function in a reasonable and predictable amount of time.     Equipment Recommendations   None recommended by OT (patient reports all DME in facility)      Precautions/Restrictions   Precautions Precautions: Fall Restrictions Weight Bearing Restrictions Per Provider Order: No     Mobility Bed Mobility Overal bed mobility: Needs Assistance Bed Mobility: Rolling Rolling: Total assist         General bed mobility comments: has not sat EOB for > 1 yr as per patient report    Transfers                   General transfer comment: total lift at baseline          ADL either performed or assessed with clinical judgement   ADL Overall ADL's  : Needs assistance/impaired Eating/Feeding: Independent;Bed level   Grooming: Wash/dry hands;Wash/dry face;Oral care;Independent;Bed level   Upper Body Bathing: Minimal assistance;Bed level Upper Body Bathing Details (indicate cue type and reason): due to body habitus Lower Body Bathing: Total assistance;Bed level   Upper Body Dressing : Minimal assistance;Bed level Upper Body Dressing Details (indicate cue type and reason): due to body habitus Lower Body Dressing: Total assistance;Bed level   Toilet Transfer:  (uses breifs at baseline)           Functional mobility during ADLs: Total assistance (hoyer lift transfers at baseline to w/c) General ADL Comments: cues for breathig and pacing integration     Vision Baseline Vision/History: 0 No visual deficits;1 Wears glasses Patient Visual Report: No change from baseline              Pertinent Vitals/Pain Pain Assessment Pain Assessment: Faces Faces Pain Scale: Hurts whole lot Pain Location: B LE hips and knees with hx of pain maagement at pain clinic with severe OA and DJD Pain Descriptors / Indicators: Aching, Constant, Discomfort, Moaning Pain Intervention(s): Limited activity within patient's tolerance, Monitored during session, Premedicated before session, Repositioned, Relaxation     Extremity/Trunk Assessment Upper Extremity Assessment Upper Extremity Assessment: Right hand dominant;Overall [facility] for tasks assessed   Lower Extremity Assessment Lower Extremity Assessment: Defer to PT evaluation   Cervical / Trunk Assessment Cervical / Trunk Assessment: Normal   Communication Communication Communication: No apparent difficulties   Cognition Arousal: Alert Behavior During Therapy: [facility] for tasks assessed/performed  Cognition: No apparent impairments                               Following commands: Intact       Cueing  General Comments   Cueing Techniques: Verbal cues  reporitioned, reinforced  breathing and pacing, issued and trained in L2 tband for 3x daily 10 reps tricep presses with + teach back and no SOB, use of O2 at baseline           Home Living Family/patient expects to be discharged to:: Skilled nursing facility                                 Additional Comments: [facility]      Prior Functioning/Environment Prior Level of Function : Needs assist       Physical Assist : Mobility (physical);ADLs (physical)     Mobility Comments: pt is total A for bed mobility and transfer tasks with mechanical lift at baseline at SNF, pt reports able to slowly self propel short distances with B UE and minimal use of B LE in facility. no longer using motorized wc/scooter ADLs Comments: pt is total care for all BADLs except grooming and able to self feed    OT Problem List: Decreased activity tolerance;Impaired balance (sitting and/or standing);Cardiopulmonary status limiting activity;Obesity;Pain;Increased edema    AM-PAC OT 6 Clicks Daily Activity     Outcome Measure Help from another person eating meals?: None Help from another person taking care of personal grooming?: None Help from another person toileting, which includes using toliet, bedpan, or urinal?: Total Help from another person bathing (including washing, rinsing, drying)?: Total Help from another person to put on and taking off regular upper body clothing?: A Little Help from another person to put on and taking off regular lower body clothing?: Total 6 Click Score: 14   End of Session Equipment Utilized During Treatment: Oxygen Nurse Communication: Mobility status  Activity Tolerance: Patient limited by fatigue;Patient limited by pain Patient left: in bed;with call bell/phone within reach;with bed alarm set  OT Visit Diagnosis: Other abnormalities of gait and mobility (R26.89);Pain Pain - part of body: Leg;Knee                Time: 9044-8979 OT Time Calculation (min): 25 min Charges:   OT General Charges $OT Visit: 1 Visit OT Evaluation $OT Eval Low Complexity: 1 Low  Sanaya Gwilliam OT/L Acute Rehabilitation Department  [phone]  10/11/2023, 2:53 PM

## 2023-10-11 NOTE — [place] FL2 (Signed)
 [place]  MEDICAID FL2 LEVEL OF CARE FORM     IDENTIFICATION  Patient Name: James [name] Birthdate: Aug 27, 1960 Sex: male Admission Date (Current Location): 10/09/2023  [place] and [place] Number:  [place] [id number] [facility] Facility and Address:  [facility],  [address], [place] [address]      Provider Number: [id number]  Attending Physician Name and Address:  Davia Nydia POUR, MD  Relative Name and Phone Number:  Bates [place] (brother) Ph: [phone]    Current Level of Care: Hospital Recommended Level of Care: Nursing Facility Prior Approval Number:    Date Approved/Denied:   PASRR Number: [id number]  Discharge Plan: SNF    Current Diagnoses: Patient Active Problem List   Diagnosis Date Noted   Acute on chronic respiratory failure with hypoxia (HCC) 10/10/2023   OSA on CPAP 10/10/2023   Elevated troponin 10/10/2023   History of DVT (deep vein thrombosis) 10/10/2023   Acute congestive heart failure (HCC) 10/09/2023   Anemia, unspecified 11/16/2022   Lymphocytosis (symptomatic) 11/16/2022   Unable to care for self 09/29/2022   Unable to ambulate 09/29/2022   FTT (failure to thrive) in adult 09/23/2022   Hypokalemia 09/23/2022   Cellulitis 06/18/2022   Candidal intertrigo 06/18/2022   Hyperosmolar hyperglycemic state (HHS) (HCC) 06/17/2022   Chronic, continuous use of opioids 06/17/2022   Hyponatremia 06/17/2022   Hypercapnia 06/17/2022   Generalized weakness 06/17/2022   Asthma 03/01/2022   Dyspnea on exertion 03/01/2022   Essential hypertension 03/01/2022   Pure hypercholesterolemia 03/01/2022   Mild persistent asthma without complication 02/15/2022   Class 3 severe obesity with serious comorbidity and body mass index (BMI) of 50.0 to 59.9 in adult 02/15/2022   Chronic thigh pain (Right) 12/14/2021   Obesity hypoventilation syndrome (HCC) 11/04/2021   Epidural lipomatosis (L1-2 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) 11/03/2021   Lumbar facet arthropathy  (Multilevel) (L1-2 through L5-S1) (Bilateral) 11/03/2021   Lumbar central spinal stenosis, 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 11/03/2021   Lumbar foraminal stenosis (Bilateral: L3-4, L4-5) (Left: L2-3) 11/03/2021   Lumbar lateral recess stenosis (Right: L3-4) (Bilateral: L4-5) 11/03/2021   Lumbar nerve root impingement (Right: L4) 11/03/2021   Herniated nucleus pulposus, L3-4 (Right) 11/03/2021   Hyperlipidemia associated with type 2 diabetes mellitus (HCC) 06/25/2021   Abnormal MRI, lumbar spine (05/21/2021) 05/24/2021   Chronic arthropathy 05/24/2021   Pes planus 05/24/2021   Osteoarthritis of AC (acromioclavicular) joint (Left) 04/29/2021   Osteoarthritis of shoulder (Left) 04/29/2021   Rotator cuff arthropathy of shoulder (Left) 04/29/2021   Lumbosacral facet hypertrophy (L4-5, L5-S1) 04/29/2021   Lumbar facet syndrome 04/29/2021   Abnormal MRI, hip (04/21/2021) 04/29/2021   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) 04/29/2021   Abnormal EMG (electromyogram) (03/16/2021) 04/29/2021   Polyneuropathy, 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   Diabetic sensorimotor polyneuropathy (HCC) 04/29/2021   Diabetic neuropathy (HCC) 04/26/2021   Osteoarthritis of hips (Bilateral) 03/29/2021   Osteoarthritis of hip (Left) 03/29/2021   Osteoarthritis of hip (Right) 03/29/2021   Osteoarthritis of knee (Left) 03/29/2021   Elevated sed rate 03/29/2021   Vitamin D  deficiency 03/29/2021   Elevated C-reactive protein (CRP) 03/29/2021   Lumbosacral radiculopathy at L5 (Left) 03/29/2021   Fall (03/22/2021) 03/29/2021   Pharmacologic therapy 02/15/2021   Disorder of skeletal system 02/15/2021   Problems influencing health status 02/15/2021   Chronic use of opiate for therapeutic purpose 02/15/2021   Type 2 diabetes mellitus with morbid obesity (HCC) 02/15/2021   Chronic lower extremity pain (1ry area of Pain) (Bilateral) 02/15/2021   Chronic low back pain (4th  area of Pain) (Bilateral) w/ sciatica  (Bilateral) 02/15/2021   Chronic shoulder pain (5th area of Pain) (Left) 02/15/2021   Chronic hip pain (2ry area of Pain) (Bilateral) 02/15/2021   Chronic knee pain (3ry area of Pain) (Bilateral) 02/15/2021   Weakness of lower extremities (Bilateral) 01/15/2021   Recurrent falls 01/15/2021   Chronic pain syndrome 11/09/2020   DDD (degenerative disc disease), lumbar 11/09/2020   Primary osteoarthritis involving multiple joints 11/09/2020   Diabetes mellitus (HCC) 11/09/2020    Orientation RESPIRATION BLADDER Height & Weight     Self, Time, Situation, Place  O2 (2L/min) Incontinent Weight: (!) 352 lb 11.8 oz (160 kg) Height:  6' (182.9 cm)  BEHAVIORAL SYMPTOMS/MOOD NEUROLOGICAL BOWEL NUTRITION STATUS      Continent Diet (Heart healthy/carb modified diet)  AMBULATORY STATUS COMMUNICATION OF NEEDS Skin   Total Care Verbally Other (Comment) (Erythema: bilateral arms)                       Personal Care Assistance Level of Assistance  Bathing, Feeding, Dressing Bathing Assistance: Maximum assistance Feeding assistance: Independent Dressing Assistance: Maximum assistance     Functional Limitations Info  Sight, Hearing, Speech Sight Info: Impaired Hearing Info: Adequate Speech Info: Adequate    SPECIAL CARE FACTORS FREQUENCY                       Contractures Contractures Info: Not present    Additional Factors Info  Code Status Code Status Info: Full             Current Medications (10/11/2023):  This is the current hospital active medication list Current Facility-Administered Medications  Medication Dose Route Frequency Provider Last Rate Last Admin   acetaminophen  (TYLENOL ) tablet 650 mg  650 mg Oral Q6H PRN Celinda Alm Lot, MD   650 mg at 10/10/23 2348   Or   acetaminophen  (TYLENOL ) suppository 650 mg  650 mg Rectal Q6H PRN Celinda Alm Lot, MD       apixaban  (ELIQUIS ) tablet 5 mg  5 mg Oral BID Celinda Alm Lot, MD   5 mg at 10/11/23 9147    atorvastatin  (LIPITOR) tablet 40 mg  40 mg Oral Daily Celinda Alm Lot, MD   40 mg at 10/11/23 9147   bisacodyl  (DULCOLAX) suppository 10 mg  10 mg Rectal Once Pokhrel, Laxman, MD       cholecalciferol  (VITAMIN D3) 25 MCG (1000 UNIT) tablet 2,000 Units  2,000 Units Oral Daily Celinda Alm Lot, MD   2,000 Units at 10/11/23 9147   cyclobenzaprine  (FLEXERIL ) tablet 10 mg  10 mg Oral TID PRN Celinda Alm Lot, MD   10 mg at 10/11/23 1222   docusate sodium  (COLACE) capsule 100 mg  100 mg Oral BID Pokhrel, Laxman, MD   100 mg at 10/11/23 0851   fluticasone  furoate-vilanterol (BREO ELLIPTA ) 200-25 MCG/ACT 1 puff  1 puff Inhalation Daily Celinda Alm Lot, MD   1 puff at 10/11/23 9141   furosemide  (LASIX ) tablet 20 mg  20 mg Oral Daily Turner, Traci R, MD   20 mg at 10/11/23 0851   insulin  aspart (novoLOG ) injection 0-20 Units  0-20 Units Subcutaneous TID WC Celinda Alm Lot, MD   4 Units at 10/11/23 1222   ipratropium-albuterol  (DUONEB) 0.5-2.5 (3) MG/3ML nebulizer solution 3 mL  3 mL Nebulization Q4H PRN Celinda Alm Lot, MD       losartan  (COZAAR ) tablet 25 mg  25 mg Oral Daily Celinda,  Alm Lot, MD   25 mg at 10/11/23 0850   morphine  (MS CONTIN ) 12 hr tablet 15 mg  15 mg Oral Q12H Celinda Alm Lot, MD   15 mg at 10/11/23 9147   ondansetron  (ZOFRAN ) tablet 4 mg  4 mg Oral Q6H PRN Celinda Alm Lot, MD       Or   ondansetron  (ZOFRAN ) injection 4 mg  4 mg Intravenous Q6H PRN Celinda Alm Lot, MD       oxyCODONE  (Oxy IR/ROXICODONE ) immediate release tablet 10 mg  10 mg Oral Q6H PRN Celinda Alm Lot, MD   10 mg at 10/11/23 1222   polyethylene glycol (MIRALAX  / GLYCOLAX ) packet 17 g  17 g Oral Daily Pokhrel, Laxman, MD       pregabalin  (LYRICA ) capsule 200 mg  200 mg Oral BID Celinda Alm Lot, MD   200 mg at 10/11/23 9147   primidone  (MYSOLINE ) tablet 50 mg  50 mg Oral QHS Celinda Alm Lot, MD   50 mg at 10/10/23 2152   traZODone  (DESYREL ) tablet 50 mg  50 mg Oral QHS  Celinda Alm Lot, MD   50 mg at 10/10/23 2152   varenicline  (CHANTIX ) tablet 1 mg  1 mg Oral BID Celinda Alm Lot, MD   1 mg at 10/11/23 9147     Discharge Medications: Please see discharge summary for a list of discharge medications.  Relevant Imaging Results:  Relevant Lab Results:   Additional Information SSN: [id number]  Duwaine GORMAN Aran, LCSW

## 2023-10-11 NOTE — Progress Notes (Signed)
 SATURATION QUALIFICATIONS: (This note is used to comply with regulatory documentation for home oxygen)  Patient Saturations on Room Air at Rest = 86%  Patient Saturations on Room Air while Ambulating = (pt unable to ambulate)  Patient Saturations on 2 Liters of oxygen while Ambulating = 96%  Please briefly explain why patient needs home oxygen: Pts O2 drops below 88% on room air.

## 2023-10-11 NOTE — TOC Progression Note (Addendum)
 Transition of Care [facility]) - Progression Note   Patient Details  Name: James [name] MRN: [id number] Date of Birth: Dec 13, 1960  [facility]) CM/SW Contact  Duwaine GORMAN Aran, LCSW Phone Number: 10/11/2023, 2:36 PM  Clinical Narrative: CSW notified patient will need to discharge back to LTC on 2L/min. CSW followed up with Shona in admissions to confirm patient could return on oxygen. Shona reported the patient's family did not pay for bed hold for patient's LTC bed and she does not know when a bed will be available. CSW left voicemail for patient's brother regarding discharge planning/LTC.  Expected Discharge Plan: Long Term Nursing Home Barriers to Discharge: Continued Medical Work up, Other (must enter comment) (Family did not pay for bed hold so [facility] did not keep patient's LTC bed available.)  Expected Discharge Plan and Services In-house Referral: Clinical Social Work Post Acute Care Choice: Nursing Home Living arrangements for the past 2 months: Skilled Nursing Facility           DME Arranged: N/A DME Agency: NA  Social Drivers of Health (SDOH) Interventions SDOH Screenings   Food Insecurity: No Food Insecurity (10/10/2023)  Housing: Low Risk  (10/10/2023)  Transportation Needs: No Transportation Needs (10/10/2023)  Utilities: Not At Risk (10/10/2023)  Alcohol Screen: Low Risk  (03/31/2023)  Depression (PHQ2-9): Low Risk  (03/31/2023)  Financial Resource Strain: Low Risk  (03/31/2023)  Physical Activity: Inactive (03/31/2023)  Social Connections: Socially Isolated (03/31/2023)  Stress: No Stress Concern Present (03/31/2023)  Tobacco Use: High Risk (10/10/2023)  Health Literacy: Adequate Health Literacy (03/31/2023)   Readmission Risk Interventions     No data to display

## 2023-10-12 DIAGNOSIS — M25512 Pain in left shoulder: Secondary | ICD-10-CM

## 2023-10-12 DIAGNOSIS — I110 Hypertensive heart disease with heart failure: Secondary | ICD-10-CM | POA: Diagnosis not present

## 2023-10-12 DIAGNOSIS — R600 Localized edema: Secondary | ICD-10-CM

## 2023-10-12 DIAGNOSIS — M79604 Pain in right leg: Secondary | ICD-10-CM | POA: Diagnosis not present

## 2023-10-12 DIAGNOSIS — M25551 Pain in right hip: Secondary | ICD-10-CM

## 2023-10-12 DIAGNOSIS — Z79891 Long term (current) use of opiate analgesic: Secondary | ICD-10-CM

## 2023-10-12 DIAGNOSIS — M25552 Pain in left hip: Secondary | ICD-10-CM

## 2023-10-12 DIAGNOSIS — M129 Arthropathy, unspecified: Secondary | ICD-10-CM

## 2023-10-12 DIAGNOSIS — M5442 Lumbago with sciatica, left side: Secondary | ICD-10-CM | POA: Diagnosis not present

## 2023-10-12 DIAGNOSIS — M79605 Pain in left leg: Secondary | ICD-10-CM

## 2023-10-12 LAB — GLUCOSE, CAPILLARY
Glucose-Capillary: 131 mg/dL — ABNORMAL HIGH (ref 70–99)
Glucose-Capillary: 143 mg/dL — ABNORMAL HIGH (ref 70–99)
Glucose-Capillary: 132 mg/dL — ABNORMAL HIGH (ref 70–99)
Glucose-Capillary: 136 mg/dL — ABNORMAL HIGH (ref 70–99)

## 2023-10-12 MED ORDER — MORPHINE SULFATE ER 15 MG PO TBCR: 15.0000 mg | EXTENDED_RELEASE_TABLET | Freq: Two times a day (BID) | ORAL | 0 refills | Status: AC

## 2023-10-12 MED ORDER — OXYCODONE HCL 10 MG PO TABS: 10.0000 mg | ORAL_TABLET | Freq: Four times a day (QID) | ORAL | 0 refills | Status: AC | PRN

## 2023-10-12 MED ORDER — FUROSEMIDE 20 MG PO TABS: 20.0000 mg | ORAL_TABLET | Freq: Every day | ORAL | Status: AC

## 2023-10-12 MED ORDER — METFORMIN HCL 1000 MG PO TABS: 1000.0000 mg | ORAL_TABLET | Freq: Two times a day (BID) | ORAL | Status: AC

## 2023-10-12 MED ORDER — DICLOFENAC SODIUM 1 % EX GEL: 2.0000 g | Freq: Four times a day (QID) | CUTANEOUS | Status: AC

## 2023-10-12 NOTE — TOC Transition Note (Addendum)
 Transition of Care [facility]) - Discharge Note  Patient Details  Name: James [name] MRN: [id number] Date of Birth: 04-13-1960  Transition of Care [facility]) CM/SW Contact:  Duwaine GORMAN Aran, LCSW Phone Number: 10/12/2023, 12:02 PM  Clinical Narrative: CSW notified by Shona with [facility] that a LTC bed is available today. The number for report is [phone] ext. [phone]. FL2, discharge summary, discharge order, and SNF transfer report faxed to facility in hub. Medical necessity form done; PTAR scheduled. Discharge packet completed. CSW notified brother of discharge and that referral was faxed to [facility] for him to follow up on after discharge. RN updated. Care management signing off.  Addendum: CSW received call from Tiffany with [facility] that patient was not on a CPAP at the facility, so they will not be able to admit the patient back until the CPAP is ordered. CSW updated hospitalist and RN. CSW cancelled PTAR.  Final next level of care: Long Term Nursing Home Barriers to Discharge: Barriers Resolved  Patient Goals and CMS Choice Patient states their goals for this hospitalization and ongoing recovery are:: LTC at [facility] Choice offered to / list presented to : NA  Discharge Placement    Patient chooses bed at: [facility] Patient to be transferred to facility by: PTAR Name of family member notified: Cammeron Greis (brother) Patient and family notified of of transfer: 10/12/23  Discharge Plan and Services Additional resources added to the After Visit Summary for   In-house Referral: Clinical Social Work Post Acute Care Choice: Nursing Home          DME Arranged: N/A DME Agency: NA  Social Drivers of Health (SDOH) Interventions SDOH Screenings   Food Insecurity: No Food Insecurity (10/10/2023)  Housing: Low Risk  (10/10/2023)  Transportation Needs: No Transportation Needs (10/10/2023)  Utilities: Not At Risk (10/10/2023)  Alcohol Screen: Low Risk  (03/31/2023)   Depression (PHQ2-9): Low Risk  (03/31/2023)  Financial Resource Strain: Low Risk  (03/31/2023)  Physical Activity: Inactive (03/31/2023)  Social Connections: Socially Isolated (03/31/2023)  Stress: No Stress Concern Present (03/31/2023)  Tobacco Use: High Risk (10/10/2023)  Health Literacy: Adequate Health Literacy (03/31/2023)   Readmission Risk Interventions     No data to display

## 2023-10-12 NOTE — Discharge Summary (Signed)
 Physician Discharge Summary   Patient: James [name] MRN: [id number] DOB: 1960-01-20  Admit date:     10/09/2023  Discharge date: 10/12/23  Discharge Physician: Nydia Distance, MD    PCP: Edman Marsa PARAS, DO   Recommendations at discharge:   Lasix  20 mg p.o. daily and additional 20 mg as needed for weight gain greater than 3lbs in 1 day or 5lbs in 1 week, shortness of breath or lower extremity edema. SATURATION QUALIFICATIONS: (This note is used to comply with regulatory documentation for home oxygen) Patient Saturations on Room Air at Rest = 86% Patient Saturations on Room Air while Ambulating = (pt unable to ambulate) Patient Saturations on 2 Liters of oxygen while Ambulating = 96% Please briefly explain why patient needs home oxygen: Pts O2 drops below 88% on room air.   Discharge Diagnoses:    Acute congestive heart failure (HCC)   Chronic low back pain (4th area of Pain) (Bilateral) w/ sciatica (Bilateral)   Polyneuropathy, peripheral sensorimotor axonal   Chronic pain syndrome   Type 2 diabetes mellitus with morbid obesity (HCC)   Essential hypertension   Obesity hypoventilation syndrome (HCC)   Pure hypercholesterolemia   Acute on chronic respiratory failure with hypoxia (HCC)   OSA on CPAP   Elevated troponin   History of DVT (deep vein thrombosis)    Hospital Course:  Patient is a 63 year old male with arthritis, diabetic peripheral neuropathy, type 2 diabetes, hyperlipidemia, hypertension, class III obesity,, lumbar central spinal stenosis presented to hospital from facility with complaints of dyspnea and productive cough with fatigue for last 2 to 3 days with lower extremity edema. In the ED patient had temperature of 99.3 F and was slightly tachycardic at 103. Labs were notable for WBC elevation at 12.0 with 75% neutrophils. Lactic acid of was normal. Troponins were elevated at 116 followed by 139 and 163. proBNP less than 50. CMP showed creatinine of 0.3.  Urinalysis with small hemoglobin. COVID influenza and RSV was negative. Chest x-ray without any obvious infiltrate. CTA of the chest did not show any pulmonary embolism but possible atelectasi   Assessment and Plan:  Acute  respiratory failure with hypoxia secondary to  acute diastolic CHF,  OSA, obesity hypoventilation - At baseline not on O2 at home but has CPAP, was hypoxic with pulse ox of 80s at home.   - Was followed by pulmonology in [place], moved to [address] [place] 2 years ago, has not had CPAP, will need CPAP at discharge. - Chronic lower extremity edema, received IV Lasix , negative balance of 6 L at discharge  - Home O2 evaluation completed, will need to 2 L of O2  - 2D echo showed EF of 65 to 70%, no regional WMA, G1 DD - Per cardiology, continue Lasix  20 mg daily with additional 20 mg as needed for weight greater than 3 pounds in 1 day or 5 pounds in 1 week and lower extremity edema.  -Continue CPAP nightly   Elevated troponin secondary to demand ischemia -In the setting of acute diastolic CHF, mildly elevated with flat trend, not consistent with ACS per cardiology. -2D echo showed no regional WMA, not a good cath candidate due to morbid obesity and bedridden   OSA, obesity hypoventilation - Ambulatory referral to pulmonology placed, outpatient sleep study/CPAP.  He has not followed with pulmonology since moving to [facility].     Chronic low back pain (4th area of Pain) (Bilateral) w/ sciatica (Bilateral)   Polyneuropathy, peripheral sensorimotor axonal   Chronic pain  syndrome -Continue MS Contin  oxycodone  and Lyrica . Patient is mostly bedbound.     Type 2 diabetes mellitus with morbid obesity (HCC) -Outpatient on metformin  and Ozempic .  Hemoglobin A1c 5.8 - Continue outpatient regimen       Essential hypertension Continue losartan     Debility deconditioning weakness.  - Continue PT    Constipation - Continue bowel regimen    History of DVT.  On Eliquis .  Will continue.   Mild  leukocytosis.  -Resolved - UA negative for UTI, COVID, flu, RSV negative - Blood cultures Staph hominis, likely contaminant      Obesity class III Estimated body mass index is 47.84 kg/m as calculated from the following:   Height as of this encounter: 6' (1.829 m).   Weight as of this encounter: 160 kg.         Pain control - [place]  Controlled Substance Reporting System database was reviewed. and patient was instructed, not to drive, operate heavy machinery, perform activities at heights, swimming or participation in water activities or provide baby-sitting services while on Pain, Sleep and Anxiety Medications; until their outpatient Physician has advised to do so again. Also recommended to not to take more than prescribed Pain, Sleep and Anxiety Medications.  Consultants: Cardiology Procedures performed:   Disposition: Rehabilitation facility Diet recommendation:  Discharge Diet Orders (From admission, onward)     Start     Ordered   10/12/23 0000  Diet Carb Modified        10/12/23 1036           Carb modified diet DISCHARGE MEDICATION: Allergies as of 10/12/2023   No Known Allergies      Medication List     TAKE these medications    apixaban  5 MG Tabs tablet Commonly known as: ELIQUIS  Take 5 mg by mouth 2 (two) times daily.   atorvastatin  40 MG tablet Commonly known as: LIPITOR Take 1 tablet by mouth once daily   Chantix  1 MG tablet Generic drug: varenicline  Take 1 mg by mouth 2 (two) times daily.   Cholecalciferol  50 MCG (2000 UT) Tabs Take 2,000 Units by mouth daily.   cyclobenzaprine  10 MG tablet Commonly known as: FLEXERIL  Take 1 tablet (10 mg total) by mouth 3 (three) times daily as needed for muscle spasms.   diclofenac  Sodium 1 % Gel Commonly known as: VOLTAREN  Apply 2 g topically 4 (four) times daily. Apply to knees   fluticasone -salmeterol 250-50 MCG/ACT Aepb Commonly known as: Advair Diskus Inhale 1 puff into the lungs in the  morning and at bedtime. What changed: when to take this   furosemide  20 MG tablet Commonly known as: LASIX  Take 1 tablet (20 mg total) by mouth daily. And additional 20 mg as needed for weight gain more than 3 lbs in 1 day or 5lbs in 1 week, with shortness of breath or edema What changed: See the new instructions.   ipratropium-albuterol  0.5-2.5 (3) MG/3ML Soln Commonly known as: DUONEB Take 3 mLs by nebulization every 4 (four) hours as needed (SOB or Wheezing).   lactulose  10 GM/15ML solution Commonly known as: CHRONULAC  Take 45 mLs (30 g total) by mouth daily as needed for mild constipation.   losartan  25 MG tablet Commonly known as: COZAAR  Take 1 tablet by mouth once daily   metFORMIN  1000 MG tablet Commonly known as: GLUCOPHAGE  Take 1 tablet (1,000 mg total) by mouth in the morning and at bedtime. What changed: See the new instructions.   morphine  15 MG 12  hr tablet Commonly known as: MS CONTIN  Take 1 tablet (15 mg total) by mouth every 12 (twelve) hours.   OneTouch Delica Lancets 33G Misc Use to check blood sugar 1 x daily   OneTouch Verio test strip Generic drug: glucose blood Use to check blood sugar 1 x daily   OneTouch Verio w/Device Kit Use to check blood sugar 1 x daily   Oxycodone  HCl 10 MG Tabs Take 1 tablet (10 mg total) by mouth every 6 (six) hours as needed (moderate/severe pain). Must last 30 days.   Ozempic  (1 MG/DOSE) 4 MG/3ML Sopn Generic drug: Semaglutide  (1 MG/DOSE) INJECT 1MG  INTO THE SKIN ONCE A WEEK   Pen Needles 3/16 31G X 5 MM Misc 1 each by Does not apply route 4 (four) times daily -  before meals and at bedtime.   polyethylene glycol 17 g packet Commonly known as: MIRALAX  / GLYCOLAX  Take 17 g by mouth 2 (two) times daily.   pregabalin  200 MG capsule Commonly known as: LYRICA  Take 1 capsule (200 mg total) by mouth 2 (two) times daily.   primidone  50 MG tablet Commonly known as: MYSOLINE  Take 50 mg by mouth at bedtime.    traZODone  50 MG tablet Commonly known as: DESYREL  Take 50 mg by mouth at bedtime.        Contact information for follow-up providers     Edman Marsa PARAS, DO. Schedule an appointment as soon as possible for a visit in 2 week(s).   Specialty: Family Medicine Why: for hospital follow-up Contact information: [address] [place] [address] [phone]         Rana Lum CROME, NP Follow up on 10/18/2023.   Specialty: Cardiology Why: for hospital follow-up at 1:55PM, arrive 15 minutes prior to your appointment Contact information: [address] [place] [address] [phone]              Contact information for after-discharge care     Destination     [facility]   Service: Skilled Nursing Contact information: [address] [place]  [address] [phone]                    Discharge Exam: Filed Weights   10/10/23 0744 10/11/23 0500 10/12/23 0500  Weight: (!) 160.5 kg (!) 160 kg (!) 160.2 kg   S: No acute complaints, chronic hip pain.  Lower extremity edema has significantly improved.  Cleared for discharge today.  BP 127/75 (BP Location: Right Wrist)   Pulse 87   Temp 98.4 F (36.9 C)   Resp 18   Ht 6' (1.829 m)   Wt (!) 160.2 kg   SpO2 94%   BMI 47.90 kg/m   Physical Exam General: Alert and oriented x 3, NAD Cardiovascular: S1 S2 clear, RRR.  Respiratory: CTAB, no wheezing Gastrointestinal: Obese, soft, nontender, nondistended, NBS Ext: no pedal edema bilaterally Neuro: no new deficits Psych: Normal affect    Condition at discharge: fair  The results of significant diagnostics from this hospitalization (including imaging, microbiology, ancillary and laboratory) are listed below for reference.   Imaging Studies: ECHOCARDIOGRAM COMPLETE Result Date: 10/10/2023    ECHOCARDIOGRAM REPORT   Patient Name:   WISAM SIEFRING Date of Exam: 10/10/2023 Medical Rec #:   [id number]         Height:       78.0 in Accession #:    [id number]        Weight:  353.8 lb Date of Birth:  01/12/1961         BSA:          2.877 m Patient Age:    63 years          BP:           136/78 mmHg Patient Gender: M                 HR:           82 bpm. Exam Location:  Inpatient Procedure: 2D Echo, Cardiac Doppler, Color Doppler and Intracardiac            Opacification Agent (Both Spectral and Color Flow Doppler were            utilized during procedure). Indications:    Dyspnea  History:        Patient has no prior history of Echocardiogram examinations.                 CHF; Risk Factors:Diabetes, Hypertension and Dyslipidemia.  Sonographer:    Philomena Daring Referring Phys: [id number] DAVID MANUEL ORTIZ  Sonographer Comments: Technically challenging study due to limited acoustic windows, Technically difficult study due to poor echo windows, suboptimal apical window and patient is obese. Image acquisition challenging due to patient body habitus. IMPRESSIONS  1. Left ventricular ejection fraction, by estimation, is 65 to 70%. The left ventricle has normal function. The left ventricle has no regional wall motion abnormalities. Left ventricular diastolic parameters are consistent with Grade I diastolic dysfunction (impaired relaxation).  2. Right ventricular systolic function is normal. The right ventricular size is normal.  3. The mitral valve was not well visualized. No evidence of mitral valve regurgitation. No evidence of mitral stenosis.  4. The aortic valve was not well visualized. Aortic valve regurgitation is not visualized. No aortic stenosis is present.  5. This is a technically challenging study. Comparison(s): No prior Echocardiogram. FINDINGS  Left Ventricle: Left ventricular ejection fraction, by estimation, is 65 to 70%. The left ventricle has normal function. The left ventricle has no regional wall motion abnormalities. Definity  contrast agent was given IV to delineate the left ventricular   endocardial borders. The left ventricular internal cavity size was normal in size. Suboptimal image quality limits for assessment of left ventricular hypertrophy. Left ventricular diastolic parameters are consistent with Grade I diastolic dysfunction (impaired relaxation). Right Ventricle: The right ventricular size is normal. No increase in right ventricular wall thickness. Right ventricular systolic function is normal. Left Atrium: Left atrial size was normal in size. Right Atrium: Right atrial size was normal in size. Pericardium: There is no evidence of pericardial effusion. Presence of epicardial fat layer. Mitral Valve: The mitral valve was not well visualized. No evidence of mitral valve regurgitation. No evidence of mitral valve stenosis. Tricuspid Valve: The tricuspid valve is not well visualized. Tricuspid valve regurgitation is not demonstrated. No evidence of tricuspid stenosis. Aortic Valve: The aortic valve was not well visualized. Aortic valve regurgitation is not visualized. No aortic stenosis is present. Pulmonic Valve: The pulmonic valve was not well visualized. Pulmonic valve regurgitation is not visualized. No evidence of pulmonic stenosis. Aorta: The aortic root is normal in size and structure. IAS/Shunts: No atrial level shunt detected by color flow Doppler.  LEFT VENTRICLE PLAX 2D LVIDd:         5.30 cm   Diastology LVIDs:         3.50 cm   LV e' lateral:  5.66 cm/s LV PW:         1.20 cm   LV E/e' lateral: 13.5 LV IVS:        1.30 cm LVOT diam:     2.50 cm LVOT Area:     4.91 cm  RIGHT VENTRICLE             IVC RV S prime:     16.90 cm/s  IVC diam: 2.00 cm TAPSE (M-mode): 2.3 cm LEFT ATRIUM           Index LA diam:      2.90 cm 1.01 cm/m LA Vol (A4C): 73.3 ml 25.48 ml/m   AORTA Ao Root diam: 3.40 cm MITRAL VALVE               TRICUSPID VALVE MV Area (PHT): 4.60 cm    TR Peak grad:   6.7 mmHg MV Decel Time: 165 msec    TR Vmax:        129.00 cm/s MV E velocity: 76.60 cm/s MV A velocity:  92.50 cm/s  SHUNTS MV E/A ratio:  0.83        Systemic Diam: 2.50 cm Stanly Leavens MD Electronically signed by Stanly Leavens MD Signature Date/Time: 10/10/2023/10:47:51 AM    Final    CT Angio Chest PE W and/or Wo Contrast Result Date: 10/09/2023 CLINICAL DATA:  Dyspnea for 3 days, oxygen supplementation at baseline. History of DVT on Eliquis . Pulmonary embolism (PE) suspected, high prob EXAM: CT ANGIOGRAPHY CHEST WITH CONTRAST TECHNIQUE: Multidetector CT imaging of the chest was performed using the standard protocol during bolus administration of intravenous contrast. Multiplanar CT image reconstructions and MIPs were obtained to evaluate the vascular anatomy. RADIATION DOSE REDUCTION: This exam was performed according to the departmental dose-optimization program which includes automated exposure control, adjustment of the mA and/or kV according to patient size and/or use of iterative reconstruction technique. CONTRAST:  75mL OMNIPAQUE  IOHEXOL  350 MG/ML SOLN COMPARISON:  Chest radiograph from earlier today. FINDINGS: Cardiovascular: The study is moderate quality for the evaluation of pulmonary embolism, with some motion degradation. There are no filling defects in the central, lobar, segmental or subsegmental pulmonary artery branches to suggest acute pulmonary embolism. Atherosclerotic nonaneurysmal thoracic aorta. Normal caliber pulmonary arteries. Top normal heart size. No significant pericardial fluid/thickening. Mediastinum/Nodes: No significant thyroid nodules. Unremarkable esophagus. No pathologically enlarged axillary, mediastinal or hilar lymph nodes. Lungs/Pleura: No pneumothorax. No pleural effusion. Curvilinear thick bandlike scarring versus atelectasis throughout the posterior lungs. Consolidative airspace disease, lung masses or significant pulmonary nodules. Upper abdomen: No acute abnormality. Musculoskeletal: No aggressive appearing focal osseous lesions. Symmetric mild  gynecomastia. Healed deformities in multiple posterior lower ribs bilaterally. Minimal thoracic spondylosis. Review of the MIP images confirms the above findings. IMPRESSION: 1. No pulmonary embolism. 2. Curvilinear thick bandlike scarring versus atelectasis throughout the posterior lungs. 3.  Aortic Atherosclerosis (ICD10-I70.0). Electronically Signed   By: Selinda DELENA Blue M.D.   On: 10/09/2023 10:29   DG Chest Portable 1 View Result Date: 10/09/2023 EXAM: 1 VIEW XRAY OF THE CHEST 10/09/2023 06:28:00 AM COMPARISON: None available. CLINICAL HISTORY: SOB; hypoxia. Per chart - Patient arrived with complaints of worsening trimmers and shortness of breath over the last three days. Wears 2L O2 at baseline. 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 FINDINGS: LUNGS AND PLEURA: No focal pulmonary opacity. No pulmonary edema. No pleural effusion. No pneumothorax. Low lung volumes. HEART AND MEDIASTINUM: Cardiomegaly. BONES AND SOFT TISSUES: No acute osseous abnormality. IMPRESSION: 1. No acute cardiopulmonary process.  2. Cardiomegaly. 3. Low lung volumes. Electronically signed by: Waddell Calk MD 10/09/2023 06:32 AM EDT RP Workstation: [id number]    Microbiology: Results for orders placed or performed during the hospital encounter of 10/09/23  Resp panel by RT-PCR (RSV, Flu A&B, Covid) Anterior Nasal Swab     Status: None   Collection Time: 10/09/23  6:39 AM   Specimen: Anterior Nasal Swab  Result Value Ref Range Status   SARS Coronavirus 2 by RT PCR NEGATIVE NEGATIVE Final    Comment: (NOTE) SARS-CoV-2 target nucleic acids are NOT DETECTED.  The SARS-CoV-2 RNA is generally detectable in upper respiratory specimens during the acute phase of infection. The lowest concentration of SARS-CoV-2 viral copies this assay can detect is 138 copies/mL. A negative result does not preclude SARS-Cov-2 infection and should not be used as the sole basis for treatment or other patient management decisions. A negative result may  occur with  improper specimen collection/handling, submission of specimen other than nasopharyngeal swab, presence of viral mutation(s) within the areas targeted by this assay, and inadequate number of viral copies(<138 copies/mL). A negative result must be combined with clinical observations, patient history, and epidemiological information. The expected result is Negative.  Fact Sheet for Patients:  BloggerCourse.com  Fact Sheet for Healthcare Providers:  SeriousBroker.it  This test is no t yet approved or cleared by the [place]  FDA and  has been authorized for detection and/or diagnosis of SARS-CoV-2 by FDA under an Emergency Use Authorization (EUA). This EUA will remain  in effect (meaning this test can be used) for the duration of the COVID-19 declaration under Section 564(b)(1) of the Act, 21 U.S.C.section 360bbb-3(b)(1), unless the authorization is terminated  or revoked sooner.       Influenza A by PCR NEGATIVE NEGATIVE Final   Influenza B by PCR NEGATIVE NEGATIVE Final    Comment: (NOTE) The Xpert Xpress SARS-CoV-2/FLU/RSV plus assay is intended as an aid in the diagnosis of influenza from Nasopharyngeal swab specimens and should not be used as a sole basis for treatment. Nasal washings and aspirates are unacceptable for Xpert Xpress SARS-CoV-2/FLU/RSV testing.  Fact Sheet for Patients: BloggerCourse.com  Fact Sheet for Healthcare Providers: SeriousBroker.it  This test is not yet approved or cleared by the [place]  FDA and has been authorized for detection and/or diagnosis of SARS-CoV-2 by FDA under an Emergency Use Authorization (EUA). This EUA will remain in effect (meaning this test can be used) for the duration of the COVID-19 declaration under Section 564(b)(1) of the Act, 21 U.S.C. section 360bbb-3(b)(1), unless the authorization is terminated  or revoked.     Resp Syncytial Virus by PCR NEGATIVE NEGATIVE Final    Comment: (NOTE) Fact Sheet for Patients: BloggerCourse.com  Fact Sheet for Healthcare Providers: SeriousBroker.it  This test is not yet approved or cleared by the [place]  FDA and has been authorized for detection and/or diagnosis of SARS-CoV-2 by FDA under an Emergency Use Authorization (EUA). This EUA will remain in effect (meaning this test can be used) for the duration of the COVID-19 declaration under Section 564(b)(1) of the Act, 21 U.S.C. section 360bbb-3(b)(1), unless the authorization is terminated or revoked.  Performed at [facility], [address], [place], [place] [address]   Blood culture (routine x 2)     Status: None (Preliminary result)   Collection Time: 10/09/23  2:50 PM   Specimen: BLOOD RIGHT HAND  Result Value Ref Range Status   Specimen Description   Final  BLOOD RIGHT HAND Performed at [facility], [address] [place], [place] [address]    Special Requests   Final    BOTTLES DRAWN AEROBIC AND ANAEROBIC Blood Culture results may not be optimal due to an inadequate volume of blood received in culture bottles Performed at [facility], [address] [place], [place] [address]    Culture   Final    NO GROWTH 3 DAYS Performed at [facility], [address], [place], [place] [address]    Report Status PENDING  Incomplete  Blood culture (routine x 2)     Status: Abnormal   Collection Time: 10/09/23  7:50 PM   Specimen: BLOOD RIGHT HAND  Result Value Ref Range Status   Specimen Description   Final    BLOOD RIGHT HAND Performed at [facility], [address] [place], [place] [address]    Special Requests   Final    BOTTLES DRAWN AEROBIC ONLY Blood Culture adequate volume Performed at [facility], [address] [place], [place] [address]     Culture  Setup Time   Final    GRAM POSITIVE COCCI IN CLUSTERS AEROBIC BOTTLE ONLY CRITICAL RESULT CALLED TO, READ BACK BY AND VERIFIED WITH: PHARMD EMERSON MILLET [phone] @ 2102 FH    Culture (A)  Final    STAPHYLOCOCCUS HOMINIS THE SIGNIFICANCE OF ISOLATING THIS ORGANISM FROM A SINGLE SET OF BLOOD CULTURES WHEN MULTIPLE SETS ARE DRAWN IS UNCERTAIN. PLEASE NOTIFY THE MICROBIOLOGY DEPARTMENT WITHIN ONE WEEK IF SPECIATION AND SENSITIVITIES ARE REQUIRED. Performed at [facility], [address] [place], [place] [address]    Report Status 10/11/2023 FINAL  Final  Blood Culture ID Panel (Reflexed)     Status: Abnormal   Collection Time: 10/09/23  7:50 PM  Result Value Ref Range Status   Enterococcus faecalis NOT DETECTED NOT DETECTED Final   Enterococcus Faecium NOT DETECTED NOT DETECTED Final   Listeria monocytogenes NOT DETECTED NOT DETECTED Final   Staphylococcus species DETECTED (A) NOT DETECTED Final    Comment: CRITICAL RESULT CALLED TO, READ BACK BY AND VERIFIED WITH: MAYA EMERSON MILLET [phone] @ 2102 FH    Staphylococcus aureus (BCID) NOT DETECTED NOT DETECTED Final   Staphylococcus epidermidis NOT DETECTED NOT DETECTED Final   Staphylococcus lugdunensis NOT DETECTED NOT DETECTED Final   Streptococcus species NOT DETECTED NOT DETECTED Final   Streptococcus agalactiae NOT DETECTED NOT DETECTED Final   Streptococcus pneumoniae NOT DETECTED NOT DETECTED Final   Streptococcus pyogenes NOT DETECTED NOT DETECTED Final   A.calcoaceticus-baumannii NOT DETECTED NOT DETECTED Final   Bacteroides fragilis NOT DETECTED NOT DETECTED Final   Enterobacterales NOT DETECTED NOT DETECTED Final   Enterobacter cloacae complex NOT DETECTED NOT DETECTED Final   Escherichia coli NOT DETECTED NOT DETECTED Final   Klebsiella aerogenes NOT DETECTED NOT DETECTED Final   Klebsiella oxytoca NOT DETECTED NOT DETECTED Final   Klebsiella pneumoniae NOT DETECTED NOT DETECTED Final   Proteus species  NOT DETECTED NOT DETECTED Final   Salmonella species NOT DETECTED NOT DETECTED Final   Serratia marcescens NOT DETECTED NOT DETECTED Final   Haemophilus influenzae NOT DETECTED NOT DETECTED Final   Neisseria meningitidis NOT DETECTED NOT DETECTED Final   Pseudomonas aeruginosa NOT DETECTED NOT DETECTED Final   Stenotrophomonas maltophilia NOT DETECTED NOT DETECTED Final   Candida albicans NOT DETECTED NOT DETECTED Final   Candida auris NOT DETECTED NOT DETECTED Final   Candida glabrata  NOT DETECTED NOT DETECTED Final   Candida krusei NOT DETECTED NOT DETECTED Final   Candida parapsilosis NOT DETECTED NOT DETECTED Final   Candida tropicalis NOT DETECTED NOT DETECTED Final   Cryptococcus neoformans/gattii NOT DETECTED NOT DETECTED Final    Comment: Performed at [facility], [address], [place], [place] [address]    Labs: CBC: Recent Labs  Lab 10/09/23 [phone] 10/10/23 0806 10/11/23 0512  WBC 12.0* 10.7* 10.0  NEUTROABS 9.2*  --   --   HGB 12.6* 14.3 13.4  HCT 45.6 47.7 44.2  MCV 97.9 93.0 91.9  PLT 336 329 327   Basic Metabolic Panel: Recent Labs  Lab 10/09/23 0506 10/09/23 1950 10/10/23 0806 10/11/23 0512  NA 140  --  137 139  K 4.1  --  3.7 3.5  CL 94*  --  91* 94*  CO2 37*  --  30 35*  GLUCOSE 106*  --  119* 116*  BUN 9  --  8 9  CREATININE 0.37*  --  0.39* 0.45*  CALCIUM  9.0  --  9.6 8.9  MG  --  1.6*  --  1.7  PHOS  --  2.4*  --  2.7   Liver Function Tests: Recent Labs  Lab 10/09/23 0506 10/10/23 0806  AST 11* 14*  ALT <5 <5  ALKPHOS 113 115  BILITOT 0.6 1.0  PROT 6.1* 6.7  ALBUMIN 3.6 3.7   CBG: Recent Labs  Lab 10/11/23 0733 10/11/23 1149 10/11/23 1632 10/11/23 2020 10/12/23 0743  GLUCAP 126* 173* 149* 142* 131*    Discharge time spent: greater than 30 minutes.  Signed: Nydia Distance, MD [facility] 10/12/2023

## 2023-10-13 ENCOUNTER — Encounter: Payer: Self-pay | Admitting: Sports Medicine

## 2023-10-13 DIAGNOSIS — M5442 Lumbago with sciatica, left side: Secondary | ICD-10-CM | POA: Diagnosis not present

## 2023-10-13 DIAGNOSIS — R600 Localized edema: Secondary | ICD-10-CM | POA: Diagnosis not present

## 2023-10-13 DIAGNOSIS — I110 Hypertensive heart disease with heart failure: Secondary | ICD-10-CM | POA: Diagnosis not present

## 2023-10-13 DIAGNOSIS — M25561 Pain in right knee: Secondary | ICD-10-CM

## 2023-10-13 DIAGNOSIS — M25562 Pain in left knee: Secondary | ICD-10-CM

## 2023-10-13 DIAGNOSIS — M129 Arthropathy, unspecified: Secondary | ICD-10-CM | POA: Diagnosis not present

## 2023-10-13 DIAGNOSIS — R42 Dizziness and giddiness: Secondary | ICD-10-CM | POA: Diagnosis not present

## 2023-10-13 LAB — GLUCOSE, CAPILLARY
Glucose-Capillary: 132 mg/dL — ABNORMAL HIGH (ref 70–99)
Glucose-Capillary: 147 mg/dL — ABNORMAL HIGH (ref 70–99)

## 2023-10-13 NOTE — Discharge Summary (Signed)
 Physician Discharge Summary   Patient: James [name] MRN: [id number] DOB: [birth year]  Admit date:     10/09/2023  Discharge date: 10/13/23  Discharge Physician: Nydia Distance, MD    PCP: Edman Marsa PARAS, DO   Recommendations at discharge:    Lasix  20 mg p.o. daily and additional 20 mg as needed for weight gain greater than 3lbs in 1 day or 5lbs in 1 week, shortness of breath or lower extremity edema. SATURATION QUALIFICATIONS: (This note is used to comply with regulatory documentation for home oxygen) Patient Saturations on Room Air at Rest = 86% Patient Saturations on Room Air while Ambulating = (pt unable to ambulate) Patient Saturations on 2 Liters of oxygen while Ambulating = 96% Please briefly explain why patient needs home oxygen: Pts O2 drops below 88% on room air. CPAP at bedtime, 2 L O2 during daytime  Discharge Diagnoses:  Acute diastolic congestive heart failure (HCC) Obesity hypoventilation   Acute on chronic respiratory failure with hypoxia (HCC)   OSA on CPAP   Chronic low back pain (4th area of Pain) (Bilateral) w/ sciatica (Bilateral)   Polyneuropathy, peripheral sensorimotor axonal   Chronic pain syndrome   Type 2 diabetes mellitus with morbid obesity (HCC)   Essential hypertension   Obesity hypoventilation syndrome (HCC)   Pure hypercholesterolemia   Elevated troponin   History of DVT (deep vein thrombosis)   Hospital Course: Patient is a 63 year old male with arthritis, diabetic peripheral neuropathy, type 2 diabetes, hyperlipidemia, hypertension, class III obesity,, lumbar central spinal stenosis presented to hospital from facility with complaints of dyspnea and productive cough with fatigue for last 2 to 3 days with lower extremity edema. In the ED patient had temperature of 99.3 F and was slightly tachycardic at 103. Labs were notable for WBC elevation at 12.0 with 75% neutrophils. Lactic acid of was normal. Troponins were elevated at 116  followed by 139 and 163. proBNP less than 50. CMP showed creatinine of 0.3. Urinalysis with small hemoglobin. COVID influenza and RSV was negative. Chest x-ray without any obvious infiltrate. CTA of the chest did not show any pulmonary embolism but possible atelectasis.     Assessment and Plan:  Acute  respiratory failure with hypoxia secondary to  acute diastolic CHF,  OSA, obesity hypoventilation - At baseline not on O2 at home but has CPAP, was hypoxic with pulse ox of 80s at home.   - Was followed by pulmonology in [place], moved to [address] [place] 2 years ago, has not had CPAP, will need CPAP at discharge. - Chronic lower extremity edema, received IV Lasix , negative balance of 6 L at discharge  - Home O2 evaluation completed, will need to 2 L of O2  - 2D echo showed EF of 65 to 70%, no regional WMA, G1 DD - Per cardiology, continue Lasix  20 mg daily with additional 20 mg as needed for weight greater than 3 pounds in 1 day or 5 pounds in 1 week and lower extremity edema.  -Continue CPAP nightly   Elevated troponin secondary to demand ischemia -In the setting of acute diastolic CHF, mildly elevated with flat trend, not consistent with ACS per cardiology. -2D echo showed no regional WMA, not a good cath candidate due to morbid obesity and bedridden   OSA, obesity hypoventilation - Ambulatory referral to pulmonology placed, outpatient sleep study/CPAP.  He has not followed with pulmonology since moving to [facility].     Chronic low back pain (4th area of Pain) (Bilateral) w/ sciatica (Bilateral)  Polyneuropathy, peripheral sensorimotor axonal   Chronic pain syndrome -Continue MS Contin  oxycodone  and Lyrica . Patient is mostly bedbound.     Type 2 diabetes mellitus with morbid obesity (HCC) -Outpatient on metformin  and Ozempic .  Hemoglobin A1c 5.8 - Continue outpatient regimen       Essential hypertension Continue losartan     Debility deconditioning weakness.  - Continue PT    Constipation - Continue  bowel regimen    History of DVT.  On Eliquis .  Will continue.   Mild leukocytosis.  -Resolved - UA negative for UTI, COVID, flu, RSV negative - Blood cultures Staph hominis, likely contaminant      Obesity class III Estimated body mass index is 47.84 kg/m as calculated from the following:   Height as of this encounter: 6' (1.829 m).   Weight as of this encounter: 160 kg.          Pain control - [place]  Controlled Substance Reporting System database was reviewed. and patient was instructed, not to drive, operate heavy machinery, perform activities at heights, swimming or participation in water activities or provide baby-sitting services while on Pain, Sleep and Anxiety Medications; until their outpatient Physician has advised to do so again. Also recommended to not to take more than prescribed Pain, Sleep and Anxiety Medications.  Consultants: Cardiology Procedures performed:   Disposition: Long term care facility Diet recommendation:  Discharge Diet Orders (From admission, onward)     Start     Ordered   10/12/23 0000  Diet Carb Modified        10/12/23 1036            DISCHARGE MEDICATION: Allergies as of 10/13/2023   No Known Allergies      Medication List     TAKE these medications    apixaban  5 MG Tabs tablet Commonly known as: ELIQUIS  Take 5 mg by mouth 2 (two) times daily.   atorvastatin  40 MG tablet Commonly known as: LIPITOR Take 1 tablet by mouth once daily   Chantix  1 MG tablet Generic drug: varenicline  Take 1 mg by mouth 2 (two) times daily.   Cholecalciferol  50 MCG (2000 UT) Tabs Take 2,000 Units by mouth daily.   cyclobenzaprine  10 MG tablet Commonly known as: FLEXERIL  Take 1 tablet (10 mg total) by mouth 3 (three) times daily as needed for muscle spasms.   diclofenac  Sodium 1 % Gel Commonly known as: VOLTAREN  Apply 2 g topically 4 (four) times daily. Apply to knees   fluticasone -salmeterol 250-50 MCG/ACT Aepb Commonly known  as: Advair Diskus Inhale 1 puff into the lungs in the morning and at bedtime. What changed: when to take this   furosemide  20 MG tablet Commonly known as: LASIX  Take 1 tablet (20 mg total) by mouth daily. And additional 20 mg as needed for weight gain more than 3 lbs in 1 day or 5lbs in 1 week, with shortness of breath or edema What changed: See the new instructions.   ipratropium-albuterol  0.5-2.5 (3) MG/3ML Soln Commonly known as: DUONEB Take 3 mLs by nebulization every 4 (four) hours as needed (SOB or Wheezing).   lactulose  10 GM/15ML solution Commonly known as: CHRONULAC  Take 45 mLs (30 g total) by mouth daily as needed for mild constipation.   losartan  25 MG tablet Commonly known as: COZAAR  Take 1 tablet by mouth once daily   metFORMIN  1000 MG tablet Commonly known as: GLUCOPHAGE  Take 1 tablet (1,000 mg total) by mouth in the morning and at bedtime. What changed: See  the new instructions.   morphine  15 MG 12 hr tablet Commonly known as: MS CONTIN  Take 1 tablet (15 mg total) by mouth every 12 (twelve) hours.   OneTouch Delica Lancets 33G Misc Use to check blood sugar 1 x daily   OneTouch Verio test strip Generic drug: glucose blood Use to check blood sugar 1 x daily   OneTouch Verio w/Device Kit Use to check blood sugar 1 x daily   Oxycodone  HCl 10 MG Tabs Take 1 tablet (10 mg total) by mouth every 6 (six) hours as needed (moderate/severe pain). Must last 30 days.   Ozempic  (1 MG/DOSE) 4 MG/3ML Sopn Generic drug: Semaglutide  (1 MG/DOSE) INJECT 1MG  INTO THE SKIN ONCE A WEEK   Pen Needles 3/16 31G X 5 MM Misc 1 each by Does not apply route 4 (four) times daily -  before meals and at bedtime.   polyethylene glycol 17 g packet Commonly known as: MIRALAX  / GLYCOLAX  Take 17 g by mouth 2 (two) times daily.   pregabalin  200 MG capsule Commonly known as: LYRICA  Take 1 capsule (200 mg total) by mouth 2 (two) times daily.   primidone  50 MG tablet Commonly known  as: MYSOLINE  Take 50 mg by mouth at bedtime.   traZODone  50 MG tablet Commonly known as: DESYREL  Take 50 mg by mouth at bedtime.        Contact information for follow-up providers     Edman Marsa PARAS, DO. Schedule an appointment as soon as possible for a visit in 2 week(s).   Specialty: Family Medicine Why: for hospital follow-up Contact information: [address] [place] [address] [phone]         Rana Lum CROME, NP Follow up on 10/18/2023.   Specialty: Cardiology Why: for hospital follow-up at 1:55PM, arrive 15 minutes prior to your appointment Contact information: [address] [place] [address] [phone]              Contact information for after-discharge care     Destination     [facility]   Service: Skilled Nursing Contact information: [address] [place]  [phone]                    Discharge Exam: Filed Weights   10/12/23 0500 10/13/23 0500 10/13/23 0533  Weight: (!) 160.2 kg (!) 160.1 kg (!) 162.3 kg   S: No acute complaints, looking forward to discharge today.  No fever chills, chest pain or acute shortness of breath.  BP 112/67 (BP Location: Left Arm)   Pulse 82   Temp 97.8 F (36.6 C) (Oral)   Resp 16   Ht 6' (1.829 m)   Wt (!) 162.3 kg   SpO2 (!) 83%   BMI 48.53 kg/m   Physical Exam General: Alert and oriented x 3, NAD Cardiovascular: S1 S2 clear, RRR.  Respiratory: CTAB, no wheezing Gastrointestinal: Obese soft, nontender, nondistended, NBS Ext: no pedal edema bilaterally Neuro: no new deficits Psych: Normal affect    Condition at discharge: fair  The results of significant diagnostics from this hospitalization (including imaging, microbiology, ancillary and laboratory) are listed below for reference.   Imaging Studies: ECHOCARDIOGRAM COMPLETE Result Date: 10/10/2023    ECHOCARDIOGRAM REPORT   Patient Name:   ANTONE SUMMONS Date of Exam: 10/10/2023 Medical Rec #:  [id number]         Height:       78.0 in Accession #:    [id number]  Weight:       353.8 lb Date of Birth:  03-17-1960         BSA:          2.877 m Patient Age:    63 years          BP:           136/78 mmHg Patient Gender: M                 HR:           82 bpm. Exam Location:  Inpatient Procedure: 2D Echo, Cardiac Doppler, Color Doppler and Intracardiac            Opacification Agent (Both Spectral and Color Flow Doppler were            utilized during procedure). Indications:    Dyspnea  History:        Patient has no prior history of Echocardiogram examinations.                 CHF; Risk Factors:Diabetes, Hypertension and Dyslipidemia.  Sonographer:    Philomena Daring Referring Phys: [id number] DAVID MANUEL ORTIZ  Sonographer Comments: Technically challenging study due to limited acoustic windows, Technically difficult study due to poor echo windows, suboptimal apical window and patient is obese. Image acquisition challenging due to patient body habitus. IMPRESSIONS  1. Left ventricular ejection fraction, by estimation, is 65 to 70%. The left ventricle has normal function. The left ventricle has no regional wall motion abnormalities. Left ventricular diastolic parameters are consistent with Grade I diastolic dysfunction (impaired relaxation).  2. Right ventricular systolic function is normal. The right ventricular size is normal.  3. The mitral valve was not well visualized. No evidence of mitral valve regurgitation. No evidence of mitral stenosis.  4. The aortic valve was not well visualized. Aortic valve regurgitation is not visualized. No aortic stenosis is present.  5. This is a technically challenging study. Comparison(s): No prior Echocardiogram. FINDINGS  Left Ventricle: Left ventricular ejection fraction, by estimation, is 65 to 70%. The left ventricle has normal function. The left ventricle has no regional wall motion abnormalities. Definity  contrast  agent was given IV to delineate the left ventricular  endocardial borders. The left ventricular internal cavity size was normal in size. Suboptimal image quality limits for assessment of left ventricular hypertrophy. Left ventricular diastolic parameters are consistent with Grade I diastolic dysfunction (impaired relaxation). Right Ventricle: The right ventricular size is normal. No increase in right ventricular wall thickness. Right ventricular systolic function is normal. Left Atrium: Left atrial size was normal in size. Right Atrium: Right atrial size was normal in size. Pericardium: There is no evidence of pericardial effusion. Presence of epicardial fat layer. Mitral Valve: The mitral valve was not well visualized. No evidence of mitral valve regurgitation. No evidence of mitral valve stenosis. Tricuspid Valve: The tricuspid valve is not well visualized. Tricuspid valve regurgitation is not demonstrated. No evidence of tricuspid stenosis. Aortic Valve: The aortic valve was not well visualized. Aortic valve regurgitation is not visualized. No aortic stenosis is present. Pulmonic Valve: The pulmonic valve was not well visualized. Pulmonic valve regurgitation is not visualized. No evidence of pulmonic stenosis. Aorta: The aortic root is normal in size and structure. IAS/Shunts: No atrial level shunt detected by color flow Doppler.  LEFT VENTRICLE PLAX 2D LVIDd:         5.30 cm   Diastology LVIDs:         3.50 cm  LV e' lateral:   5.66 cm/s LV PW:         1.20 cm   LV E/e' lateral: 13.5 LV IVS:        1.30 cm LVOT diam:     2.50 cm LVOT Area:     4.91 cm  RIGHT VENTRICLE             IVC RV S prime:     16.90 cm/s  IVC diam: 2.00 cm TAPSE (M-mode): 2.3 cm LEFT ATRIUM           Index LA diam:      2.90 cm 1.01 cm/m LA Vol (A4C): 73.3 ml 25.48 ml/m   AORTA Ao Root diam: 3.40 cm MITRAL VALVE               TRICUSPID VALVE MV Area (PHT): 4.60 cm    TR Peak grad:   6.7 mmHg MV Decel Time: 165 msec    TR Vmax:         129.00 cm/s MV E velocity: 76.60 cm/s MV A velocity: 92.50 cm/s  SHUNTS MV E/A ratio:  0.83        Systemic Diam: 2.50 cm Stanly Leavens MD Electronically signed by Stanly Leavens MD Signature Date/Time: 10/10/2023/10:47:51 AM    Final    CT Angio Chest PE W and/or Wo Contrast Result Date: 10/09/2023 CLINICAL DATA:  Dyspnea for 3 days, oxygen supplementation at baseline. History of DVT on Eliquis . Pulmonary embolism (PE) suspected, high prob EXAM: CT ANGIOGRAPHY CHEST WITH CONTRAST TECHNIQUE: Multidetector CT imaging of the chest was performed using the standard protocol during bolus administration of intravenous contrast. Multiplanar CT image reconstructions and MIPs were obtained to evaluate the vascular anatomy. RADIATION DOSE REDUCTION: This exam was performed according to the departmental dose-optimization program which includes automated exposure control, adjustment of the mA and/or kV according to patient size and/or use of iterative reconstruction technique. CONTRAST:  75mL OMNIPAQUE  IOHEXOL  350 MG/ML SOLN COMPARISON:  Chest radiograph from earlier today. FINDINGS: Cardiovascular: The study is moderate quality for the evaluation of pulmonary embolism, with some motion degradation. There are no filling defects in the central, lobar, segmental or subsegmental pulmonary artery branches to suggest acute pulmonary embolism. Atherosclerotic nonaneurysmal thoracic aorta. Normal caliber pulmonary arteries. Top normal heart size. No significant pericardial fluid/thickening. Mediastinum/Nodes: No significant thyroid nodules. Unremarkable esophagus. No pathologically enlarged axillary, mediastinal or hilar lymph nodes. Lungs/Pleura: No pneumothorax. No pleural effusion. Curvilinear thick bandlike scarring versus atelectasis throughout the posterior lungs. Consolidative airspace disease, lung masses or significant pulmonary nodules. Upper abdomen: No acute abnormality. Musculoskeletal: No aggressive  appearing focal osseous lesions. Symmetric mild gynecomastia. Healed deformities in multiple posterior lower ribs bilaterally. Minimal thoracic spondylosis. Review of the MIP images confirms the above findings. IMPRESSION: 1. No pulmonary embolism. 2. Curvilinear thick bandlike scarring versus atelectasis throughout the posterior lungs. 3.  Aortic Atherosclerosis (ICD10-I70.0). Electronically Signed   By: Selinda DELENA Blue M.D.   On: 10/09/2023 10:29   DG Chest Portable 1 View Result Date: 10/09/2023 EXAM: 1 VIEW XRAY OF THE CHEST 10/09/2023 06:28:00 AM COMPARISON: None available. CLINICAL HISTORY: SOB; hypoxia. Per chart - Patient arrived with complaints of worsening trimmers and shortness of breath over the last three days. Wears 2L O2 at baseline. 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 FINDINGS: LUNGS AND PLEURA: No focal pulmonary opacity. No pulmonary edema. No pleural effusion. No pneumothorax. Low lung volumes. HEART AND MEDIASTINUM: Cardiomegaly. BONES AND SOFT TISSUES: No acute osseous abnormality. IMPRESSION:  1. No acute cardiopulmonary process. 2. Cardiomegaly. 3. Low lung volumes. Electronically signed by: Waddell Calk MD 10/09/2023 06:32 AM EDT RP Workstation: [id number]    Microbiology: Results for orders placed or performed during the hospital encounter of 10/09/23  Resp panel by RT-PCR (RSV, Flu A&B, Covid) Anterior Nasal Swab     Status: None   Collection Time: 10/09/23  6:39 AM   Specimen: Anterior Nasal Swab  Result Value Ref Range Status   SARS Coronavirus 2 by RT PCR NEGATIVE NEGATIVE Final    Comment: (NOTE) SARS-CoV-2 target nucleic acids are NOT DETECTED.  The SARS-CoV-2 RNA is generally detectable in upper respiratory specimens during the acute phase of infection. The lowest concentration of SARS-CoV-2 viral copies this assay can detect is 138 copies/mL. A negative result does not preclude SARS-Cov-2 infection and should not be used as the sole basis for treatment or other patient  management decisions. A negative result may occur with  improper specimen collection/handling, submission of specimen other than nasopharyngeal swab, presence of viral mutation(s) within the areas targeted by this assay, and inadequate number of viral copies(<138 copies/mL). A negative result must be combined with clinical observations, patient history, and epidemiological information. The expected result is Negative.  Fact Sheet for Patients:  BloggerCourse.com  Fact Sheet for Healthcare Providers:  SeriousBroker.it  This test is no t yet approved or cleared by the [place]  FDA and  has been authorized for detection and/or diagnosis of SARS-CoV-2 by FDA under an Emergency Use Authorization (EUA). This EUA will remain  in effect (meaning this test can be used) for the duration of the COVID-19 declaration under Section 564(b)(1) of the Act, 21 U.S.C.section 360bbb-3(b)(1), unless the authorization is terminated  or revoked sooner.       Influenza A by PCR NEGATIVE NEGATIVE Final   Influenza B by PCR NEGATIVE NEGATIVE Final    Comment: (NOTE) The Xpert Xpress SARS-CoV-2/FLU/RSV plus assay is intended as an aid in the diagnosis of influenza from Nasopharyngeal swab specimens and should not be used as a sole basis for treatment. Nasal washings and aspirates are unacceptable for Xpert Xpress SARS-CoV-2/FLU/RSV testing.  Fact Sheet for Patients: BloggerCourse.com  Fact Sheet for Healthcare Providers: SeriousBroker.it  This test is not yet approved or cleared by the [place]  FDA and has been authorized for detection and/or diagnosis of SARS-CoV-2 by FDA under an Emergency Use Authorization (EUA). This EUA will remain in effect (meaning this test can be used) for the duration of the COVID-19 declaration under Section 564(b)(1) of the Act, 21 U.S.C. section 360bbb-3(b)(1),  unless the authorization is terminated or revoked.     Resp Syncytial Virus by PCR NEGATIVE NEGATIVE Final    Comment: (NOTE) Fact Sheet for Patients: BloggerCourse.com  Fact Sheet for Healthcare Providers: SeriousBroker.it  This test is not yet approved or cleared by the [place]  FDA and has been authorized for detection and/or diagnosis of SARS-CoV-2 by FDA under an Emergency Use Authorization (EUA). This EUA will remain in effect (meaning this test can be used) for the duration of the COVID-19 declaration under Section 564(b)(1) of the Act, 21 U.S.C. section 360bbb-3(b)(1), unless the authorization is terminated or revoked.  Performed at [facility], [address] [place], [place] [address]   Blood culture (routine x 2)     Status: None (Preliminary result)   Collection Time: 10/09/23  2:50 PM   Specimen: BLOOD RIGHT HAND  Result Value Ref Range Status   Specimen Description  Final    BLOOD RIGHT HAND Performed at [facility], [address] [place], [place] [address]    Special Requests   Final    BOTTLES DRAWN AEROBIC AND ANAEROBIC Blood Culture results may not be optimal due to an inadequate volume of blood received in culture bottles Performed at [facility], [address] [place], [place] [address]    Culture   Final    NO GROWTH 4 DAYS Performed at [facility], [address] [place], [place] [address]    Report Status PENDING  Incomplete  Blood culture (routine x 2)     Status: Abnormal   Collection Time: 10/09/23  7:50 PM   Specimen: BLOOD RIGHT HAND  Result Value Ref Range Status   Specimen Description   Final    BLOOD RIGHT HAND Performed at [facility], [address] [place], [place] [address]    Special Requests   Final    BOTTLES DRAWN AEROBIC ONLY Blood Culture adequate volume Performed at [facility], [address], [place], [place] [address]    Culture  Setup Time   Final    GRAM POSITIVE COCCI IN CLUSTERS AEROBIC BOTTLE ONLY CRITICAL RESULT CALLED TO, READ BACK BY AND VERIFIED WITH: PHARMD EMERSON MILLET [phone] @ 2102 FH    Culture (A)  Final    STAPHYLOCOCCUS HOMINIS THE SIGNIFICANCE OF ISOLATING THIS ORGANISM FROM A SINGLE SET OF BLOOD CULTURES WHEN MULTIPLE SETS ARE DRAWN IS UNCERTAIN. PLEASE NOTIFY THE MICROBIOLOGY DEPARTMENT WITHIN ONE WEEK IF SPECIATION AND SENSITIVITIES ARE REQUIRED. Performed at [facility], [address], [place], [place] [address]    Report Status 10/11/2023 FINAL  Final  Blood Culture ID Panel (Reflexed)     Status: Abnormal   Collection Time: 10/09/23  7:50 PM  Result Value Ref Range Status   Enterococcus faecalis NOT DETECTED NOT DETECTED Final   Enterococcus Faecium NOT DETECTED NOT DETECTED Final   Listeria monocytogenes NOT DETECTED NOT DETECTED Final   Staphylococcus species DETECTED (A) NOT DETECTED Final    Comment: CRITICAL RESULT CALLED TO, READ BACK BY AND VERIFIED WITH: MAYA EMERSON MILLET [phone] @ 2102 FH    Staphylococcus aureus (BCID) NOT DETECTED NOT DETECTED Final   Staphylococcus epidermidis NOT DETECTED NOT DETECTED Final   Staphylococcus lugdunensis NOT DETECTED NOT DETECTED Final   Streptococcus species NOT DETECTED NOT DETECTED Final   Streptococcus agalactiae NOT DETECTED NOT DETECTED Final   Streptococcus pneumoniae NOT DETECTED NOT DETECTED Final   Streptococcus pyogenes NOT DETECTED NOT DETECTED Final   A.calcoaceticus-baumannii NOT DETECTED NOT DETECTED Final   Bacteroides fragilis NOT DETECTED NOT DETECTED Final   Enterobacterales NOT DETECTED NOT DETECTED Final   Enterobacter cloacae complex NOT DETECTED NOT DETECTED Final   Escherichia coli NOT DETECTED NOT DETECTED Final   Klebsiella aerogenes NOT DETECTED NOT DETECTED Final   Klebsiella oxytoca NOT DETECTED NOT DETECTED Final   Klebsiella pneumoniae NOT DETECTED  NOT DETECTED Final   Proteus species NOT DETECTED NOT DETECTED Final   Salmonella species NOT DETECTED NOT DETECTED Final   Serratia marcescens NOT DETECTED NOT DETECTED Final   Haemophilus influenzae NOT DETECTED NOT DETECTED Final   Neisseria meningitidis NOT DETECTED NOT DETECTED Final   Pseudomonas aeruginosa NOT DETECTED NOT DETECTED Final   Stenotrophomonas maltophilia NOT DETECTED NOT DETECTED Final   Candida albicans NOT DETECTED NOT DETECTED Final   Candida auris NOT DETECTED NOT DETECTED Final  Candida glabrata NOT DETECTED NOT DETECTED Final   Candida krusei NOT DETECTED NOT DETECTED Final   Candida parapsilosis NOT DETECTED NOT DETECTED Final   Candida tropicalis NOT DETECTED NOT DETECTED Final   Cryptococcus neoformans/gattii NOT DETECTED NOT DETECTED Final    Comment: Performed at [facility], [address], [place], [place] [address]    Labs: CBC: Recent Labs  Lab 10/09/23 [id number] 10/10/23 0806 10/11/23 0512  WBC 12.0* 10.7* 10.0  NEUTROABS 9.2*  --   --   HGB 12.6* 14.3 13.4  HCT 45.6 47.7 44.2  MCV 97.9 93.0 91.9  PLT 336 329 327   Basic Metabolic Panel: Recent Labs  Lab 10/09/23 0506 10/09/23 1950 10/10/23 0806 10/11/23 0512  NA 140  --  137 139  K 4.1  --  3.7 3.5  CL 94*  --  91* 94*  CO2 37*  --  30 35*  GLUCOSE 106*  --  119* 116*  BUN 9  --  8 9  CREATININE 0.37*  --  0.39* 0.45*  CALCIUM  9.0  --  9.6 8.9  MG  --  1.6*  --  1.7  PHOS  --  2.4*  --  2.7   Liver Function Tests: Recent Labs  Lab 10/09/23 0506 10/10/23 0806  AST 11* 14*  ALT <5 <5  ALKPHOS 113 115  BILITOT 0.6 1.0  PROT 6.1* 6.7  ALBUMIN 3.6 3.7   CBG: Recent Labs  Lab 10/12/23 0743 10/12/23 1134 10/12/23 1657 10/12/23 2116 10/13/23 0743  GLUCAP 131* 143* 132* 136* 132*    Discharge time spent: greater than 30 minutes.  Signed: Nydia Distance, MD [facility] 10/13/2023

## 2023-10-13 NOTE — TOC Transition Note (Signed)
 Transition of Care [facility]) - Discharge Note  Patient Details  Name: James [name] MRN: [id number] Date of Birth: 1960-10-18  [facility] of [facility]) CM/SW Contact:  Duwaine GORMAN Aran, LCSW Phone Number: 10/13/2023, 11:33 AM  Clinical Narrative: CSW confirmed with Shona in admissions that patient's CPAP is now at the facility. Discharge packet updated. Discharge paperwork faxed to facility in hub. Patient and RN updated. PTAR scheduled. Care management signing off.  Final next level of care: Long Term Nursing Home Barriers to Discharge: Barriers Resolved  Patient Goals and CMS Choice Patient states their goals for this hospitalization and ongoing recovery are:: LTC at [facility] Choice offered to / list presented to : NA  Discharge Placement        Patient chooses bed at: [facility] Patient to be transferred to facility by: PTAR Name of family member notified: Kyrillos Adams (brother) Patient and family notified of of transfer: 10/12/23  Discharge Plan and Services Additional resources added to the After Visit Summary for   In-house Referral: Clinical Social Work Post Acute Care Choice: Nursing Home          DME Arranged: N/A DME Agency: NA  Social Drivers of Health (SDOH) Interventions SDOH Screenings   Food Insecurity: No Food Insecurity (10/10/2023)  Housing: Low Risk  (10/10/2023)  Transportation Needs: No Transportation Needs (10/10/2023)  Utilities: Not At Risk (10/10/2023)  Alcohol Screen: Low Risk  (03/31/2023)  Depression (PHQ2-9): Low Risk  (03/31/2023)  Financial Resource Strain: Low Risk  (03/31/2023)  Physical Activity: Inactive (03/31/2023)  Social Connections: Socially Isolated (03/31/2023)  Stress: No Stress Concern Present (03/31/2023)  Tobacco Use: High Risk (10/13/2023)  Health Literacy: Adequate Health Literacy (03/31/2023)   Readmission Risk Interventions     No data to display

## 2023-10-13 NOTE — Progress Notes (Signed)
 Pt was picked up by PTAR at 12:55.  Have made three attempts to call [facility] for handoff and have been unable to reach anyone.  Nurse for handoff not at phone and mailbox was full.

## 2023-10-13 NOTE — Progress Notes (Signed)
   10/13/23 0015  BiPAP/CPAP/SIPAP  $ Non-Invasive Home Ventilator  Subsequent  BiPAP/CPAP/SIPAP Pt Type Adult  BiPAP/CPAP/SIPAP Resmed  Mask Type Full face mask  Dentures removed? Not applicable  Mask Size Medium  Flow Rate 2 lpm  Patient Home Machine No  Patient Home Mask No  Patient Home Tubing No  Auto Titrate Yes  Minimum cmH2O 5 cmH2O  Maximum cmH2O 20 cmH2O  Device Plugged into RED Power Outlet Yes  BiPAP/CPAP /SiPAP Vitals  Pulse Rate 85  Resp 18  SpO2 95 %  Bilateral Breath Sounds Clear;Diminished  MEWS Score/Color  MEWS Score 0  MEWS Score Color Landy

## 2023-10-14 LAB — CULTURE, BLOOD (ROUTINE X 2): Culture: NO GROWTH

## 2023-10-18 ENCOUNTER — Ambulatory Visit: Admitting: Emergency Medicine

## 2023-10-27 ENCOUNTER — Encounter: Payer: Self-pay | Admitting: *Deleted

## 2023-10-30 ENCOUNTER — Ambulatory Visit: Attending: Emergency Medicine | Admitting: Emergency Medicine

## 2023-10-30 ENCOUNTER — Encounter: Payer: Self-pay | Admitting: Emergency Medicine

## 2023-10-30 VITALS — BP 117/70 | HR 93 | Ht 72.0 in | Wt 348.0 lb

## 2023-10-30 DIAGNOSIS — I10 Essential (primary) hypertension: Secondary | ICD-10-CM | POA: Diagnosis not present

## 2023-10-30 DIAGNOSIS — E782 Mixed hyperlipidemia: Secondary | ICD-10-CM

## 2023-10-30 DIAGNOSIS — Z86718 Personal history of other venous thrombosis and embolism: Secondary | ICD-10-CM

## 2023-10-30 DIAGNOSIS — G4733 Obstructive sleep apnea (adult) (pediatric): Secondary | ICD-10-CM

## 2023-10-30 DIAGNOSIS — I5032 Chronic diastolic (congestive) heart failure: Secondary | ICD-10-CM

## 2023-10-30 NOTE — Patient Instructions (Signed)
 Medication Instructions:  NO CHANGES  Lab Work: NONE TO BE DONE TODAY.  Testing/Procedures: NONE  Follow-Up: At [facility], you and your health needs are our priority.  As part of our continuing mission to provide you with exceptional heart care, our providers are all part of one team.  This team includes your primary Cardiologist (physician) and Advanced Practice Providers or APPs (Physician Assistants and Nurse Practitioners) who all work together to provide you with the care you need, when you need it.  Your next appointment:   6 MONTHS  Provider:   DR. WILBERT BIHARI, MD

## 2023-10-30 NOTE — Progress Notes (Unsigned)
 Cardiology Office Note:    Date:  10/31/2023  ID:  James [name], DOB 18-Mar-1960, MRN [id number] PCP: Edman Marsa PARAS, DO  [facility] Providers Cardiologist:  Wilbert Bihari, MD       Patient Profile:       Chief Complaint: Follow-up hospitalization History of Present Illness:  James [name] is a 63 y.o. male with visit-pertinent history of chronic pain syndrome, history of DVT on Eliquis , type 2 diabetes with neuropathy, hypertension, asthma, and arthritis  Patient presented to the ED on 10/09/2023 for shortness of breath and tremors over the past 3 days.  Nursing staff at his facility noted hypoxia and increasing oxygen requirement.  Troponin 116, 139, 163, 160, 156.  Checks x-ray showed cardiomegaly.  CTA chest showed no evidence of PE and atherosclerosis.  He was IV diuresed.  He is nonmobile at baseline.  proBNP less than 50.  Echocardiogram 01/25/2021 showed LVEF 65 to 70%, no RWMA, grade 1 DD, RV function and size normal, no valvular abnormalities.  He has history of OSA and has not been on CPAP.  He was started on furosemide  20 mg daily.   Discussed the use of AI scribe software for clinical note transcription with the patient, who gave verbal consent to proceed.  History of Present Illness James [name] is a 63 year old male with diastolic heart failure who presents for cardiology follow-up.  He resides in a nursing facility and is mostly bedridden due to severe hip pain, awaiting hip replacement surgery. He was hospitalized last month for acute respiratory failure and is now on two liters of oxygen continuously. He uses a CPAP machine at night, which has improved his sleep quality.  He takes Lasix  every morning but has not required an extra dose. His legs are less swollen than usual, although his feet remain swollen due to a broken bed that prevents leg elevation. He is advocating for a bed that accommodates his height and allows for leg elevation.  He  is on Ozempic  and reports no significant weight loss.  He experiences occasional difficulty breathing when his oxygen cannula is dislodged but has no current chest pain or shortness of breath.  Denies orthopnea, PND.  No syncope, presyncope, lightheadedness or dizziness.   Review of systems:  Please see the history of present illness. All other systems are reviewed and otherwise negative.      Studies Reviewed:        Echocardiogram 10/10/2023 1. Left ventricular ejection fraction, by estimation, is 65 to 70%. The  left ventricle has normal function. The left ventricle has no regional  wall motion abnormalities. Left ventricular diastolic parameters are  consistent with Grade I diastolic  dysfunction (impaired relaxation).   2. Right ventricular systolic function is normal. The right ventricular  size is normal.   3. The mitral valve was not well visualized. No evidence of mitral valve  regurgitation. No evidence of mitral stenosis.   4. The aortic valve was not well visualized. Aortic valve regurgitation  is not visualized. No aortic stenosis is present.   5. This is a technically challenging study.   Risk Assessment/Calculations:              Physical Exam:   VS:  BP 117/70 (BP Location: Right Wrist, Patient Position: Sitting, Cuff Size: Large)   Pulse 93   Ht 6' (1.829 m)   Wt (!) 348 lb (157.9 kg)   BMI 47.20 kg/m    Wt Readings from Last 3 Encounters:  10/31/23 (!) 348 lb (157.9 kg)  10/30/23 (!) 348 lb (157.9 kg)  10/13/23 (!) 357 lb 12.9 oz (162.3 kg)    GEN: Well nourished, well developed in no acute distress NECK: No JVD; No carotid bruits CARDIAC: RRR, no murmurs, rubs, gallops RESPIRATORY:  Clear to auscultation without rales, wheezing or rhonchi  ABDOMEN: Soft, non-tender, non-distended EXTREMITIES: Trace bilateral pitting edema; No acute deformity      Assessment and Plan:  Chronic diastolic CHF Echocardiogram 09/2023 with LVEF 65 to 70%, no RWMA, grade  1 DD On chronic 2L O2 for obesity hypoventilation syndrome Admitted 09/2023 for respiratory failure and not thought to be volume overloaded during admission.  Worsening O2 requirements for more related to OHS and uncontrolled OSA - Now managed with PAP over the past 2 weeks  - He does have chronic LE edema due to morbid obesity, chronic venous insufficiency and sedentary state which is currently managed with loop diuretic therapy - He is currently bed ridden  - Was difficult to assess volume status during visit due to his morbid obesity.  On exam lungs CTAB with trace bilateral pitting edema - Per patient he has not required extra as needed dose of Lasix .  Reports legs are less swollen than usual.  Denies any dyspnea on O2.  Does report his bed does not elevate his legs - Overall appears well compensated.  No changes to current therapy - Continue furosemide  20 mg daily with additional 20 mg as needed for weight gain or LEE - Encouraged leg elevation, low-sodium diet, and compression stockings  Hypertension Did have an episode of hypotension during admission most likely 2/2 IV diuresis - Blood pressure today is well-controlled at 117/70 - Continue losartan  25 mg daily  Obstructive sleep apnea - Remains adherent to PAP therapy which per patient report was restarted 2 weeks ago - Management per pulmonology  Hyperlipidemia LDL 39 on 09/2023 and well-controlled - Continue atorvastatin  40 mg daily  History of DVT Recent CTA showed no PE - Today he denies any leg pain - He is managed on Eliquis  5 mg twice daily       Dispo:  Return in about 6 months (around 04/29/2024).  Signed, Lum LITTIE Louis, NP

## 2023-10-31 ENCOUNTER — Encounter: Payer: Self-pay | Admitting: Emergency Medicine

## 2023-10-31 ENCOUNTER — Ambulatory Visit

## 2023-10-31 VITALS — BP 138/85 | HR 85 | Ht 72.0 in | Wt 348.0 lb

## 2023-10-31 DIAGNOSIS — G4733 Obstructive sleep apnea (adult) (pediatric): Secondary | ICD-10-CM | POA: Diagnosis not present

## 2023-10-31 DIAGNOSIS — Z23 Encounter for immunization: Secondary | ICD-10-CM | POA: Diagnosis not present

## 2023-10-31 DIAGNOSIS — J4550 Severe persistent asthma, uncomplicated: Secondary | ICD-10-CM | POA: Diagnosis not present

## 2023-10-31 DIAGNOSIS — Z6841 Body Mass Index (BMI) 40.0 and over, adult: Secondary | ICD-10-CM

## 2023-10-31 DIAGNOSIS — I5033 Acute on chronic diastolic (congestive) heart failure: Secondary | ICD-10-CM | POA: Diagnosis not present

## 2023-10-31 NOTE — Assessment & Plan Note (Addendum)
On Ozempic

## 2023-10-31 NOTE — Assessment & Plan Note (Signed)
 Continue Advair and as needed DuoNebs. Likely has underlying COPD. Orders:   Pulmonary Function Test; Future

## 2023-10-31 NOTE — Assessment & Plan Note (Addendum)
 I discussed with the patient the pathophysiology of obstructive sleep apnea, its association with weight, and its negative effects on hypertension, diabetes, mental health, A-fib, stroke if left untreated.  I briefly discussed the treatment options for obstructive sleep apnea Likely has underlying OSA.  Feels better while sleeping on PAP at the nursing home.  Unsure what kind of PAP he is using.  He should continue with the PAP was set up with after discharge from hospital. Getting split-night study for PAP titration. Orders:   Split night study; Future

## 2023-10-31 NOTE — Addendum Note (Signed)
 Addended by: Linde Wilensky M on: 10/31/2023 02:25 PM   Modules accepted: Orders

## 2023-10-31 NOTE — Progress Notes (Signed)
 New Patient Pulmonology Office Visit   Subjective:  Patient ID: James [name], male    DOB: [birth year]  MRN: [id number]  Referred by: Davia Nydia POUR, MD  CC: No chief complaint on file.   HPI James [name] is a 63 y.o. male ex smoker with diabetes, peripheral neuropathy, hypertension, hyperlipidemia, morbid obesity, lumbar central spinal stenosis, hx of asthma.  Presents for evaluation of OSA and hypercapnic respiratory failure. Hospital admission-discharged 10/13/2023: Diagnosed with acute respiratory failure with hypoxia secondary to diastolic CHF.  Treated with diuresis.  Discharged on 2 L of oxygen. Used to follow pulmonary in [place].  Currently no sob. On 2L O2. NO prior O2. No cough. Has phlegm with clear. No prior COPD.  Dx w asthma in 34s. Fumes triggers. No prior ETT. Started on advair in the hospital. Was on albuterol  before. Prior to hospitalization used albuterol  3-4 times a day.   OSA history: Never been diagnosed with OSA.had sleep study 5 years ago. No osa per patient. Was given PAP in the hospital. Not sure what it is.  Says now using the PAP through the night. Feels better on it. Sleeping through night.  Prior to CPAP was snoring, daytime tiredness, gasping arousals and dry mouth.   Bedtime 8-9p, wakes multiple times in middle of night. Wakes at 5 am.    Lung Health: Functional status: prior to hospitalization minimal ET.  Covid vaccine: 4 doses.  Influenza vaccine: needs.  Pneumonococcal vaccine: prior had PNA vaccine.  Smoking: 1 pk x 45 yrs. Quit 1.5 yrs.  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   Imaging: Echo 09/2023: EF 65-70%, grade 1 diastolic dysfunction, normal RVSP.  CT PE 09/2023 reviewed by me: Negative for PE some ground glass changes possible pulmonary edema though not significant.  Bilateral thickening of pleura/atelectatic changes in posterior lower lung fields  PCO2 72 in 2024.       10/31/2023    1:00 PM  Results of the Epworth flowsheet  Sitting and reading 2  Watching TV 2  Sitting, inactive in a public place (e.g. a theatre or a meeting) 0  As a passenger in a car for an hour without a break 2  Lying down to rest in the afternoon when circumstances permit 3  Sitting and talking to someone 0  Sitting quietly after a lunch without alcohol 0  In a car, while stopped for a few minutes in traffic 0  Total score 9    Allergies: Patient has no known allergies.  History: Past Medical History:  Diagnosis Date   Arthritis    Diabetes mellitus without complication (HCC)    Hyperlipidemia    Hypertension    Past Surgical History:  Procedure Laterality Date   APPENDECTOMY     COLONOSCOPY WITH PROPOFOL      COLONOSCOPY WITH PROPOFOL  N/A 04/23/2021   Procedure: COLONOSCOPY WITH PROPOFOL ;  Surgeon: Therisa Bi, MD;  Location: [facility] ENDOSCOPY;  Service: Gastroenterology;  Laterality: N/A;   Family History  Problem Relation Age of Onset   Heart disease Mother    Arthritis Mother    Arthritis Father    Social History   Socioeconomic History   Marital status: Single    Spouse name: Not on file   Number of children: Not on file   Years of education: Not on file   Highest education level: Not on file  Occupational History   Not on file  Tobacco Use   Smoking status: Some Days  Current packs/day: 0.10    Average packs/day: 0.1 packs/day for 25.0 years (2.5 ttl pk-yrs)    Types: Cigarettes   Smokeless tobacco: Never  Vaping Use   Vaping status: Never Used  Substance and Sexual Activity   Alcohol use: Never   Drug use: Never   Sexual activity: Not on file  Other Topics Concern   Not on file  Social History Narrative   Not on file   Social Drivers of Health   Financial Resource Strain: Low Risk  (03/31/2023)   Overall Financial Resource Strain (CARDIA)    Difficulty of Paying Living Expenses: Not hard at all  Food Insecurity: No Food Insecurity  (10/10/2023)   Hunger Vital Sign    Worried About Running Out of Food in the Last Year: Never true    Ran Out of Food in the Last Year: Never true  Transportation Needs: No Transportation Needs (10/10/2023)   PRAPARE - Administrator, Civil Service (Medical): No    Lack of Transportation (Non-Medical): No  Physical Activity: Inactive (03/31/2023)   Exercise Vital Sign    Days of Exercise per Week: 0 days    Minutes of Exercise per Session: 0 min  Stress: No Stress Concern Present (03/31/2023)   [employer] of Occupational Health - Occupational Stress Questionnaire    Feeling of Stress : Only a little  Social Connections: Socially Isolated (03/31/2023)   Social Connection and Isolation Panel    Frequency of Communication with Friends and Family: More than three times a week    Frequency of Social Gatherings with Friends and Family: Once a week    Attends Religious Services: Never    Database administrator or Organizations: No    Attends Banker Meetings: Never    Marital Status: Divorced  Catering manager Violence: Not At Risk (10/10/2023)   Humiliation, Afraid, Rape, and Kick questionnaire    Fear of Current or Ex-Partner: No    Emotionally Abused: No    Physically Abused: No    Sexually Abused: No    Immunization status: Immunization History  Administered Date(s) Administered   Influenza, Seasonal, Injecte, Preservative Fre 09/29/2022   Influenza,inj,Quad PF,6+ Mos 11/09/2020, 11/12/2021   Influenza,inj,quad, With Preservative 10/19/2016   Moderna Covid-19 Vaccine Bivalent Booster 58yrs & up 03/27/2020   PFIZER Comirnaty(Gray Top)Covid-19 Tri-Sucrose Vaccine 06/06/2019   PFIZER(Purple Top)SARS-COV-2 Vaccination 05/16/2019   Zoster Recombinant(Shingrix ) 11/12/2021    Current Meds:  Current Outpatient Medications:    apixaban  (ELIQUIS ) 5 MG TABS tablet, Take 5 mg by mouth 2 (two) times daily., Disp: , Rfl:    atorvastatin  (LIPITOR) 40 MG tablet,  Take 1 tablet by mouth once daily, Disp: 90 tablet, Rfl: 0   Blood Glucose Monitoring Suppl (ONETOUCH VERIO) w/Device KIT, Use to check blood sugar 1 x daily, Disp: 1 kit, Rfl: 0   CHANTIX  1 MG tablet, Take 1 mg by mouth 2 (two) times daily., Disp: , Rfl:    Cholecalciferol  50 MCG (2000 UT) TABS, Take 2,000 Units by mouth daily., Disp: , Rfl:    cyclobenzaprine  (FLEXERIL ) 10 MG tablet, Take 1 tablet (10 mg total) by mouth 3 (three) times daily as needed for muscle spasms., Disp: , Rfl:    diclofenac  Sodium (VOLTAREN ) 1 % GEL, Apply 2 g topically 4 (four) times daily. Apply to knees, Disp: , Rfl:    fluticasone -salmeterol (ADVAIR DISKUS) 250-50 MCG/ACT AEPB, Inhale 1 puff into the lungs in the morning and at bedtime., Disp: 60  each, Rfl: 2   furosemide  (LASIX ) 20 MG tablet, Take 1 tablet (20 mg total) by mouth daily. And additional 20 mg as needed for weight gain more than 3 lbs in 1 day or 5lbs in 1 week, with shortness of breath or edema, Disp: , Rfl:    Insulin  Pen Needle (PEN NEEDLES 3/16) 31G X 5 MM MISC, 1 each by Does not apply route 4 (four) times daily -  before meals and at bedtime., Disp: 100 each, Rfl: 0   ipratropium-albuterol  (DUONEB) 0.5-2.5 (3) MG/3ML SOLN, Take 3 mLs by nebulization every 4 (four) hours as needed (SOB or Wheezing)., Disp: , Rfl:    lactulose  (CHRONULAC ) 10 GM/15ML solution, Take 45 mLs (30 g total) by mouth daily as needed for mild constipation., Disp: , Rfl:    losartan  (COZAAR ) 25 MG tablet, Take 1 tablet by mouth once daily, Disp: 90 tablet, Rfl: 0   metFORMIN  (GLUCOPHAGE ) 1000 MG tablet, Take 1 tablet (1,000 mg total) by mouth in the morning and at bedtime., Disp: , Rfl:    morphine  (MS CONTIN ) 15 MG 12 hr tablet, Take 1 tablet (15 mg total) by mouth every 12 (twelve) hours., Disp: 10 tablet, Rfl: 0   OneTouch Delica Lancets 33G MISC, Use to check blood sugar 1 x daily, Disp: 100 each, Rfl: 3   ONETOUCH VERIO test strip, Use to check blood sugar 1 x daily, Disp:  100 each, Rfl: 3   Oxycodone  HCl 10 MG TABS, Take 1 tablet (10 mg total) by mouth every 6 (six) hours as needed (moderate/severe pain). Must last 30 days., Disp: 10 tablet, Rfl: 0   OZEMPIC , 1 MG/DOSE, 4 MG/3ML SOPN, INJECT 1MG  INTO THE SKIN ONCE A WEEK, Disp: 9 mL, Rfl: 0   polyethylene glycol (MIRALAX  / GLYCOLAX ) 17 g packet, Take 17 g by mouth 2 (two) times daily., Disp: , Rfl:    pregabalin  (LYRICA ) 200 MG capsule, Take 1 capsule (200 mg total) by mouth 2 (two) times daily., Disp: 60 capsule, Rfl: 0   primidone  (MYSOLINE ) 50 MG tablet, Take 50 mg by mouth at bedtime., Disp: , Rfl:    traZODone  (DESYREL ) 50 MG tablet, Take 50 mg by mouth at bedtime., Disp: , Rfl:       Objective:  BP 138/85   Pulse 85   Ht 6' (1.829 m)   Wt (!) 348 lb (157.9 kg)   SpO2 94% Comment: 2L  BMI 47.20 kg/m  BMI Readings from Last 3 Encounters:  10/31/23 47.20 kg/m  10/30/23 47.20 kg/m  10/13/23 48.53 kg/m    General: Morbid obesity who is currently bedbound. Lungs: clear to auscultation bilaterally.  Heart: regular rate rhythm, no murmur appreciated.  Abdomen: non tender, non distended. Normal BS.  Neuro: axox3.   Bilateral pitting edema in lower extremities.   Diagnostic Review:  Last CBC Lab Results  Component Value Date   WBC 10.0 10/11/2023   HGB 13.4 10/11/2023   HCT 44.2 10/11/2023   MCV 91.9 10/11/2023   MCH 27.9 10/11/2023   RDW 15.1 10/11/2023   PLT 327 10/11/2023   Last metabolic panel Lab Results  Component Value Date   GLUCOSE 116 (H) 10/11/2023   NA 139 10/11/2023   K 3.5 10/11/2023   CL 94 (L) 10/11/2023   CO2 35 (H) 10/11/2023   BUN 9 10/11/2023   CREATININE 0.45 (L) 10/11/2023   GFRNONAA >60 10/11/2023   CALCIUM  8.9 10/11/2023   PHOS 2.7 10/11/2023   PROT 6.7 10/10/2023  ALBUMIN 3.7 10/10/2023   LABGLOB 2.9 02/15/2021   AGRATIO 1.4 02/15/2021   BILITOT 1.0 10/10/2023   ALKPHOS 115 10/10/2023   AST 14 (L) 10/10/2023   ALT <5 10/10/2023   ANIONGAP 10  10/11/2023   @BNPPULM @     Assessment & Plan:   Assessment & Plan OSA (obstructive sleep apnea) I discussed with the patient the pathophysiology of obstructive sleep apnea, its association with weight, and its negative effects on hypertension, diabetes, mental health, A-fib, stroke if left untreated.  I briefly discussed the treatment options for obstructive sleep apnea Likely has underlying OSA.  Feels better while sleeping on PAP at the nursing home.  Unsure what kind of PAP he is using.  He should continue with the PAP was set up with after discharge from hospital. Getting split-night study for PAP titration. Orders:   Split night study; Future  Morbid obesity (HCC) On Ozempic .    Acute on chronic diastolic congestive heart failure (HCC) On Lasix .  Continue.    Severe persistent asthma without complication (HCC) Continue Advair and as needed DuoNebs. Likely has underlying COPD. Orders:   Pulmonary Function Test; Future    No follow-ups on file.   Time spent 30 min.   Stanton Kissoon, MD

## 2023-10-31 NOTE — Patient Instructions (Signed)
 Notification of test results are managed in the following manner: If there are any recommendations or changes to the plan of care discussed in office today, we will contact you and let you know what they are. If you do not hear from us , then your results are normal/expected and you can view them through your MyChart account, or a letter will be sent to you. Thank you again for trusting us  with your care [facility].

## 2023-11-08 ENCOUNTER — Encounter: Payer: Self-pay | Admitting: Sports Medicine

## 2023-11-08 ENCOUNTER — Other Ambulatory Visit: Payer: Self-pay

## 2023-11-08 ENCOUNTER — Ambulatory Visit (INDEPENDENT_AMBULATORY_CARE_PROVIDER_SITE_OTHER): Admitting: Sports Medicine

## 2023-11-08 DIAGNOSIS — M160 Bilateral primary osteoarthritis of hip: Secondary | ICD-10-CM

## 2023-11-08 DIAGNOSIS — G8929 Other chronic pain: Secondary | ICD-10-CM | POA: Diagnosis not present

## 2023-11-08 DIAGNOSIS — E1169 Type 2 diabetes mellitus with other specified complication: Secondary | ICD-10-CM

## 2023-11-08 DIAGNOSIS — M25552 Pain in left hip: Secondary | ICD-10-CM

## 2023-11-08 MED ORDER — METHYLPREDNISOLONE ACETATE 40 MG/ML IJ SUSP
40.0000 mg | INTRAMUSCULAR | Status: AC | PRN
  Administered 2023-11-08: 40 mg via INTRA_ARTICULAR

## 2023-11-08 MED ORDER — LIDOCAINE HCL 1 % IJ SOLN
4.0000 mL | INTRAMUSCULAR | Status: AC | PRN
  Administered 2023-11-08: 4 mL

## 2023-11-08 NOTE — Progress Notes (Signed)
 James [name] - 63 y.o. male MRN [id number]  Date of birth: 06/23/1960  Office Visit Note: Visit Date: 11/08/2023 PCP: Edman Marsa PARAS, DO Referred by: Edman Marsa *  Subjective: Chief Complaint  Patient presents with   Left Hip - Pain   HPI: James [name] is a pleasant 63 y.o. male who presents today for left > right hip osteoarthritis with hip pain.  [name] He is not ambulatory. He presents today via EMS in a stretcher. He has known severe osteoarthritis of bilateral hips and has seen Dr. Lorane Gaskins in the past but has been deemed not a surgical candidate.  Mays would like to discuss hip replacement as he states he has reduced his weight to the level they desired.  I did remind him that I am not a surgeon and this would be Dr. Vernetta, but I discuss that there are other reasons making him a nonsurgical candidate including his congestive heart failure, respiratory status on chronic oxygen and the fact that he is not ambulatory.  He would like to have a further discussion with them however. He also has manage on chronic pain medication which includes oxycodone  HCL 10 mg 3 times daily as needed, he also receives morphine  IR through his facility. Also is on Lyrica  200 mg twice daily. He does have significant pain despite all these medications, but injections do help his pain for a period of time.  His last left hip injection was back on 06/28/2023, he would like to move forward with repeat injection today.  Last documented weight - 348 lbs   Pertinent ROS were reviewed with the patient and found to be negative unless otherwise specified above in HPI.   Assessment & Plan: Visit Diagnoses:  1. Chronic left hip pain   2. Primary osteoarthritis of both hips   3. Type 2 diabetes mellitus with morbid obesity (HCC)    Plan: Impression is left hip exacerbation with bilateral bone-on-bone osteoarthritic  change of each hip.  Patient is nonmobile and has been over the last year or so, does have medical comorbidities with obesity, CHF, OSA on CPAP as well as oxygen dependence during the day.  He would like to revisit hip replacement given that he has been losing some weight recently and feels he is down to an acceptable weight level.  I did remind him that I am not a surgeon and this would be Dr. Damian decision, but I discuss that are several reasons making him a nonsurgical candidate including his congestive heart failure, respiratory status on chronic oxygen and the fact that he is not ambulatory.  He would like to have a further discussion with them however -I discussed giving them his contact number to reach out via phone call to discuss further versus a video/virtual visit.  He has been receiving good relief at least in the short-term from injection therapy, through shared decision making we did proceed with ultrasound-guided left hip injection today.  Patient tolerated well, advised on postinjection protocol.  He may use ice/heat or Tylenol  for any postinjection pain.  He is on quite a bit of pain medication and he will continue his oxycodone  10 mg 3 times daily, his morphine  IR as needed at his facility as well as Lyrica  200 mg twice daily.  He is diabetic although this is well-managed at his facility, did recommend checking POC glucose checks given possible transient rise from corticosteroid injection, although he has done well  in the past without issues.  He will follow-up in about 6 weeks to reevaluate the right hip, could consider injection at that time if needed/deemed necessary.  Follow-up: Return in about 5 weeks-6 weeks for R-hip > L-hip   Meds & Orders: No orders of the defined types were placed in this encounter.   Orders Placed This Encounter  Procedures   Large Joint Inj: L hip joint   US  Guided Needle Placement - No Linked Charges     Procedures: Large Joint Inj: L hip joint on  11/08/2023 11:12 AM Indications: pain Details: 22 G 3.5 in needle, ultrasound-guided anterior approach Medications: 4 mL lidocaine  1 %; 40 mg methylPREDNISolone  acetate 40 MG/ML Outcome: tolerated well, no immediate complications  Procedure: US -guided intra-articular hip injection, Left After discussion on risks/benefits/indications and informed verbal consent was obtained, a timeout was performed. Patient was lying supine on exam table. The hip was cleaned with betadine and alcohol swabs. Then utilizing ultrasound guidance, the patient's femoral head and neck junction was identified and subsequently injected with 4:1 lidocaine :depomedrol via an in-plane approach with ultrasound visualization of the injectate administered into the hip joint. Patient tolerated procedure well without immediate complications.  Procedure, treatment alternatives, risks and benefits explained, specific risks discussed. Consent was given by the patient. Immediately prior to procedure a time out was called to verify the correct patient, procedure, equipment, support staff and site/side marked as required. Patient was prepped and draped in the usual sterile fashion.          Clinical History: No specialty comments available.  He reports that he has been smoking cigarettes. He has a 2.5 pack-year smoking history. He has never used smokeless tobacco.  Recent Labs    10/10/23 0806  HGBA1C 5.8*    Objective:    Physical Exam  Gen: Well-appearing, in no acute distress; non-toxic CV: Well-perfused. Warm.  Resp: Breathing unlabored on room air; no wheezing. Psych: Fluid speech in conversation; appropriate affect; normal thought process  Ortho Exam - Left hip: There is no overlying skin breakdown, no redness swelling or effusion.  Patient is non-mobile, presents today via EMS in a stretcher.  There is limited logroll motion with pain and restriction.  Imaging:  11/14/22: AP pelvis and lateral views of both hips:  No acute fracture.  Both hips  well located.  Severe end-stage arthritis of the right hip with cystic  changes within the femoral head.  Left hip with bone-on-bone arthritic  changes with cystic changes within the femoral head.  No evidence of AVN  or acute fracture.   Past Medical/Family/Surgical/Social History: Medications & Allergies reviewed per EMR, new medications updated. Patient Active Problem List   Diagnosis Date Noted   OSA (obstructive sleep apnea) 10/31/2023   Morbid obesity (HCC) 10/31/2023   Acute on chronic respiratory failure with hypoxia (HCC) 10/10/2023   OSA on CPAP 10/10/2023   Elevated troponin 10/10/2023   History of DVT (deep vein thrombosis) 10/10/2023   Acute congestive heart failure (HCC) 10/09/2023   Anemia, unspecified 11/16/2022   Lymphocytosis (symptomatic) 11/16/2022   Unable to care for self 09/29/2022   Unable to ambulate 09/29/2022   FTT (failure to thrive) in adult 09/23/2022   Hypokalemia 09/23/2022   Cellulitis 06/18/2022   Candidal intertrigo 06/18/2022   Hyperosmolar hyperglycemic state (HHS) (HCC) 06/17/2022   Chronic, continuous use of opioids 06/17/2022   Hyponatremia 06/17/2022   Hypercapnia 06/17/2022   Generalized weakness 06/17/2022   Asthma 03/01/2022   Dyspnea on exertion  03/01/2022   Essential hypertension 03/01/2022   Pure hypercholesterolemia 03/01/2022   Mild persistent asthma without complication 02/15/2022   Class 3 severe obesity with serious comorbidity and body mass index (BMI) of 50.0 to 59.9 in adult [facility]) 02/15/2022   Chronic thigh pain (Right) 12/14/2021    Class: Chronic   Obesity hypoventilation syndrome (HCC) 11/04/2021   Epidural lipomatosis (L1-2 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) 11/03/2021   Lumbar facet arthropathy (Multilevel) (L1-2 through L5-S1) (Bilateral) 11/03/2021   Lumbar central spinal stenosis, 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 11/03/2021   Lumbar foraminal stenosis (Bilateral: L3-4, L4-5) (Left: L2-3) 11/03/2021   Lumbar  lateral recess stenosis (Right: L3-4) (Bilateral: L4-5) 11/03/2021   Lumbar nerve root impingement (Right: L4) 11/03/2021   Herniated nucleus pulposus, L3-4 (Right) 11/03/2021   Hyperlipidemia associated with type 2 diabetes mellitus (HCC) 06/25/2021   Abnormal MRI, lumbar spine (05/21/2021) 05/24/2021   Chronic arthropathy 05/24/2021   Pes planus 05/24/2021   Osteoarthritis of AC (acromioclavicular) joint (Left) 04/29/2021   Osteoarthritis of shoulder (Left) 04/29/2021   Rotator cuff arthropathy of shoulder (Left) 04/29/2021   Lumbosacral facet hypertrophy (L4-5, L5-S1) 04/29/2021   Lumbar facet syndrome 04/29/2021   Abnormal MRI, hip (04/21/2021) 04/29/2021   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) 04/29/2021   Abnormal EMG (electromyogram) (03/16/2021) 04/29/2021   Polyneuropathy, 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   Diabetic sensorimotor polyneuropathy (HCC) 04/29/2021   Diabetic neuropathy (HCC) 04/26/2021   Osteoarthritis of hips (Bilateral) 03/29/2021   Osteoarthritis of hip (Left) 03/29/2021   Osteoarthritis of hip (Right) 03/29/2021   Osteoarthritis of knee (Left) 03/29/2021   Elevated sed rate 03/29/2021   Vitamin D  deficiency 03/29/2021   Elevated C-reactive protein (CRP) 03/29/2021   Lumbosacral radiculopathy at L5 (Left) 03/29/2021   Fall (03/22/2021) 03/29/2021   Pharmacologic therapy 02/15/2021   Disorder of skeletal system 02/15/2021   Problems influencing health status 02/15/2021   Chronic use of opiate for therapeutic purpose 02/15/2021   Type 2 diabetes mellitus with morbid obesity (HCC) 02/15/2021   Chronic lower extremity pain (1ry area of Pain) (Bilateral) 02/15/2021   Chronic low back pain (4th area of Pain) (Bilateral) w/ sciatica (Bilateral) 02/15/2021   Chronic shoulder pain (5th area of Pain) (Left) 02/15/2021   Chronic hip pain (2ry area of Pain) (Bilateral) 02/15/2021   Chronic knee pain (3ry area of Pain) (Bilateral) 02/15/2021   Weakness of  lower extremities (Bilateral) 01/15/2021   Recurrent falls 01/15/2021   Chronic pain syndrome 11/09/2020   DDD (degenerative disc disease), lumbar 11/09/2020   Primary osteoarthritis involving multiple joints 11/09/2020   Diabetes mellitus (HCC) 11/09/2020   Past Medical History:  Diagnosis Date   Arthritis    Diabetes mellitus without complication (HCC)    Hyperlipidemia    Hypertension    Family History  Problem Relation Age of Onset   Heart disease Mother    Arthritis Mother    Arthritis Father    Past Surgical History:  Procedure Laterality Date   APPENDECTOMY     COLONOSCOPY WITH PROPOFOL      COLONOSCOPY WITH PROPOFOL  N/A 04/23/2021   Procedure: COLONOSCOPY WITH PROPOFOL ;  Surgeon: Therisa Bi, MD;  Location: [facility] ENDOSCOPY;  Service: Gastroenterology;  Laterality: N/A;   Social History   Occupational History   Not on file  Tobacco Use   Smoking status: Some Days    Current packs/day: 0.10    Average packs/day: 0.1 packs/day for 25.0 years (2.5 ttl pk-yrs)    Types: Cigarettes   Smokeless tobacco: Never  Vaping Use  Vaping status: Never Used  Substance and Sexual Activity   Alcohol use: Never   Drug use: Never   Sexual activity: Not on file

## 2023-11-20 ENCOUNTER — Encounter: Payer: Self-pay | Admitting: Radiology

## 2023-12-05 ENCOUNTER — Telehealth: Payer: Self-pay | Admitting: Cardiology

## 2023-12-05 NOTE — Telephone Encounter (Signed)
 Error

## 2023-12-26 ENCOUNTER — Emergency Department (HOSPITAL_COMMUNITY)

## 2023-12-26 ENCOUNTER — Encounter (HOSPITAL_COMMUNITY): Payer: Self-pay

## 2023-12-26 ENCOUNTER — Emergency Department (HOSPITAL_COMMUNITY)
Admission: EM | Admit: 2023-12-26 | Discharge: 2023-12-26 | Disposition: A | Discharge status: Skilled Nursing Facility | Source: Skilled Nursing Facility | Attending: Emergency Medicine | Admitting: Emergency Medicine

## 2023-12-26 ENCOUNTER — Other Ambulatory Visit: Payer: Self-pay

## 2023-12-26 DIAGNOSIS — R0602 Shortness of breath: Secondary | ICD-10-CM | POA: Insufficient documentation

## 2023-12-26 DIAGNOSIS — E876 Hypokalemia: Secondary | ICD-10-CM

## 2023-12-26 DIAGNOSIS — Z79899 Other long term (current) drug therapy: Secondary | ICD-10-CM | POA: Diagnosis not present

## 2023-12-26 DIAGNOSIS — J45909 Unspecified asthma, uncomplicated: Secondary | ICD-10-CM | POA: Diagnosis not present

## 2023-12-26 DIAGNOSIS — Z7901 Long term (current) use of anticoagulants: Secondary | ICD-10-CM | POA: Diagnosis not present

## 2023-12-26 DIAGNOSIS — Z7951 Long term (current) use of inhaled steroids: Secondary | ICD-10-CM | POA: Diagnosis not present

## 2023-12-26 DIAGNOSIS — M7989 Other specified soft tissue disorders: Secondary | ICD-10-CM | POA: Diagnosis present

## 2023-12-26 DIAGNOSIS — I110 Hypertensive heart disease with heart failure: Secondary | ICD-10-CM | POA: Diagnosis not present

## 2023-12-26 DIAGNOSIS — E119 Type 2 diabetes mellitus without complications: Secondary | ICD-10-CM | POA: Diagnosis not present

## 2023-12-26 DIAGNOSIS — I509 Heart failure, unspecified: Secondary | ICD-10-CM | POA: Diagnosis not present

## 2023-12-26 DIAGNOSIS — J9611 Chronic respiratory failure with hypoxia: Secondary | ICD-10-CM

## 2023-12-26 DIAGNOSIS — Z7984 Long term (current) use of oral hypoglycemic drugs: Secondary | ICD-10-CM | POA: Diagnosis not present

## 2023-12-26 DIAGNOSIS — R600 Localized edema: Secondary | ICD-10-CM | POA: Diagnosis not present

## 2023-12-26 LAB — CBC WITH DIFFERENTIAL/PLATELET
Abs Immature Granulocytes: 0.04 K/uL (ref 0.00–0.07)
Basophils Absolute: 0 K/uL (ref 0.0–0.1)
Basophils Relative: 0 %
Eosinophils Absolute: 0.1 K/uL (ref 0.0–0.5)
Eosinophils Relative: 2 %
HCT: 42.7 % (ref 39.0–52.0)
Hemoglobin: 12.9 g/dL — ABNORMAL LOW (ref 13.0–17.0)
Immature Granulocytes: 1 %
Lymphocytes Relative: 22 %
Lymphs Abs: 1.7 K/uL (ref 0.7–4.0)
MCH: 28.5 pg (ref 26.0–34.0)
MCHC: 30.2 g/dL (ref 30.0–36.0)
MCV: 94.5 fL (ref 80.0–100.0)
Monocytes Absolute: 0.6 K/uL (ref 0.1–1.0)
Monocytes Relative: 8 %
Neutro Abs: 5.2 K/uL (ref 1.7–7.7)
Neutrophils Relative %: 67 %
Platelets: 281 K/uL (ref 150–400)
RBC: 4.52 MIL/uL (ref 4.22–5.81)
RDW: 14.9 % (ref 11.5–15.5)
WBC: 7.7 K/uL (ref 4.0–10.5)
nRBC: 0 % (ref 0.0–0.2)

## 2023-12-26 LAB — BASIC METABOLIC PANEL WITH GFR
Anion gap: 7 (ref 5–15)
BUN: 6 mg/dL — ABNORMAL LOW (ref 8–23)
CO2: 42 mmol/L — ABNORMAL HIGH (ref 22–32)
Calcium: 8.5 mg/dL — ABNORMAL LOW (ref 8.9–10.3)
Chloride: 93 mmol/L — ABNORMAL LOW (ref 98–111)
Creatinine, Ser: 0.39 mg/dL — ABNORMAL LOW (ref 0.61–1.24)
GFR, Estimated: >60 mL/min (ref >60)
Glucose, Bld: 129 mg/dL — ABNORMAL HIGH (ref 70–99)
Potassium: 3.3 mmol/L — ABNORMAL LOW (ref 3.5–5.1)
Sodium: 142 mmol/L (ref 135–145)

## 2023-12-26 LAB — TROPONIN T, HIGH SENSITIVITY
Troponin T High Sensitivity: 90 ng/L — ABNORMAL HIGH (ref 0–19)
Troponin T High Sensitivity: 87 ng/L — ABNORMAL HIGH (ref 0–19)

## 2023-12-26 LAB — PRO BRAIN NATRIURETIC PEPTIDE: Pro Brain Natriuretic Peptide: <50 pg/mL (ref <300.0)

## 2023-12-26 LAB — RESP PANEL BY RT-PCR (RSV, FLU A&B, COVID)  RVPGX2
Influenza A by PCR: NEGATIVE
Influenza B by PCR: NEGATIVE
Resp Syncytial Virus by PCR: NEGATIVE
SARS Coronavirus 2 by RT PCR: NEGATIVE

## 2023-12-26 MED ORDER — OXYCODONE HCL 5 MG PO TABS
10.0000 mg | ORAL_TABLET | Freq: Once | ORAL | Status: AC
  Administered 2023-12-26: 10 mg via ORAL
  Filled 2023-12-26: qty 2

## 2023-12-26 MED ORDER — POTASSIUM CHLORIDE CRYS ER 20 MEQ PO TBCR: 20.0000 meq | EXTENDED_RELEASE_TABLET | Freq: Every day | ORAL | 0 refills | Status: DC

## 2023-12-26 MED ORDER — FUROSEMIDE 10 MG/ML IJ SOLN
20.0000 mg | Freq: Once | INTRAMUSCULAR | Status: AC
  Administered 2023-12-26: 20 mg via INTRAVENOUS
  Filled 2023-12-26: qty 4

## 2023-12-26 MED ORDER — POTASSIUM CHLORIDE CRYS ER 20 MEQ PO TBCR
40.0000 meq | EXTENDED_RELEASE_TABLET | Freq: Once | ORAL | Status: AC
  Administered 2023-12-26: 40 meq via ORAL
  Filled 2023-12-26: qty 2

## 2023-12-26 MED ORDER — POTASSIUM CHLORIDE CRYS ER 20 MEQ PO TBCR: 20.0000 meq | EXTENDED_RELEASE_TABLET | Freq: Every day | ORAL | 0 refills | Status: AC

## 2023-12-26 NOTE — ED Triage Notes (Signed)
 [facility] reports pt coming from [facility]. PA wanted pt sent out for fluid build up EMS states they did not elaborate on anything. Pt has no complaints, pt did not even know EMS was coming. Staff states pt was c/o sob yesterday but pt denies that. Pt wears 4L O2  at baseline.

## 2023-12-26 NOTE — ED Notes (Signed)
 Called [facility] to request transport to [facility].

## 2023-12-26 NOTE — ED Provider Notes (Signed)
 [name], [name]   CSN: [id number] Arrival date & time: 12/26/23  1010     Patient presents with: Fluid build up   [name] is a 63 y.o. male.   HPI   63 year old male with medical history significant for diabetes mellitus, HTN, HLD, degenerative disc disease in the lumbar spine, osteoarthritis of multiple joints, morbid obesity obesity hypoventilation syndrome, asthma, CHF with a last EF of 65 to 70% in September 2025 with mild diastolic dysfunction who presents to the emergency department from his facility with concern for fluid overload.  The patient states that he is having worsening swelling in his legs bilaterally.  He denies any chest pain or shortness of breath.  He had reportedly endorsed shortness of breath at his facility yesterday but the patient denies this.   Prior to Admission medications   Medication Sig Start Date End Date Taking? Authorizing Provider  apixaban  (ELIQUIS ) 5 MG TABS tablet Take 5 mg by mouth 2 (two) times daily.    [provider]  atorvastatin  (LIPITOR) 40 MG tablet Take 1 tablet by mouth once daily 09/20/22   Edman, Marsa PARAS, DO  Blood Glucose Monitoring Suppl [facility] VERIO) w/Device KIT Use to check blood sugar 1 x daily 06/28/22   Karamalegos, Marsa PARAS, DO  CHANTIX  1 MG tablet Take 1 mg by mouth 2 (two) times daily.    [provider]  Cholecalciferol  50 MCG (2000 UT) TABS Take 2,000 Units by mouth daily.    [provider]  cyclobenzaprine  (FLEXERIL ) 10 MG tablet Take 1 tablet (10 mg total) by mouth 3 (three) times daily as needed for muscle spasms. 09/29/22   Caleen Qualia, MD  diclofenac  Sodium (VOLTAREN ) 1 % GEL Apply 2 g topically 4 (four) times daily. Apply to knees 10/12/23   Rai, Ripudeep K, MD  fluticasone -salmeterol (ADVAIR DISKUS) 250-50 MCG/ACT AEPB Inhale 1 puff into the lungs in the morning and at bedtime. 05/04/22   Karamalegos, Marsa PARAS,  DO  furosemide  (LASIX ) 20 MG tablet Take 1 tablet (20 mg total) by mouth daily. And additional 20 mg as needed for weight gain more than 3 lbs in 1 day or 5lbs in 1 week, with shortness of breath or edema 10/12/23   Rai, Nydia POUR, MD  Insulin  Pen Needle (PEN NEEDLES 3/16) 31G X 5 MM MISC 1 each by Does not apply route 4 (four) times daily -  before meals and at bedtime. 06/21/22   Sreenath, Sudheer B, MD  ipratropium-albuterol  (DUONEB) 0.5-2.5 (3) MG/3ML SOLN Take 3 mLs by nebulization every 4 (four) hours as needed (SOB or Wheezing).    [provider]  lactulose  (CHRONULAC ) 10 GM/15ML solution Take 45 mLs (30 g total) by mouth daily as needed for mild constipation. 09/29/22   Amin, Sumayya, MD  losartan  (COZAAR ) 25 MG tablet Take 1 tablet by mouth once daily 09/20/22   Edman, Marsa PARAS, DO  metFORMIN  (GLUCOPHAGE ) 1000 MG tablet Take 1 tablet (1,000 mg total) by mouth in the morning and at bedtime. 10/12/23   Rai, Ripudeep POUR, MD  morphine  (MS CONTIN ) 15 MG 12 hr tablet Take 1 tablet (15 mg total) by mouth every 12 (twelve) hours. 10/12/23   Rai, Nydia POUR, MD  OneTouch Delica Lancets 33G MISC Use to check blood sugar 1 x daily 06/28/22   Edman, Marsa PARAS, DO  [facility] VERIO test strip Use to check blood sugar 1 x daily 06/28/22   Edman Marsa  J, DO  Oxycodone  HCl 10 MG TABS Take 1 tablet (10 mg total) by mouth every 6 (six) hours as needed (moderate/severe pain). Must last 30 days. 10/12/23 11/11/23  Rai, Ripudeep MARLA, MD  OZEMPIC , 1 MG/DOSE, 4 MG/3ML SOPN INJECT 1MG  INTO THE SKIN ONCE A WEEK 06/21/22   Sreenath, Sudheer B, MD  polyethylene glycol (MIRALAX  / GLYCOLAX ) 17 g packet Take 17 g by mouth 2 (two) times daily. 09/29/22   Caleen Qualia, MD  pregabalin  (LYRICA ) 200 MG capsule Take 1 capsule (200 mg total) by mouth 2 (two) times daily. 09/29/22   Caleen Qualia, MD  primidone  (MYSOLINE ) 50 MG tablet Take 50 mg by mouth at bedtime.    [provider]  traZODone   (DESYREL ) 50 MG tablet Take 50 mg by mouth at bedtime.    [provider]    Allergies: Patient has no known allergies.    Review of Systems  All other systems reviewed and are negative.   Updated Vital Signs BP 127/87   Pulse 98   Temp 98.3 F (36.8 C) (Oral)   Resp 19   SpO2 97%   Physical Exam Vitals and nursing [name] reviewed.  Constitutional:      General: He is not in acute distress.    Appearance: He is well-developed. He is obese.  HENT:     Head: Normocephalic and atraumatic.  Eyes:     Conjunctiva/sclera: Conjunctivae normal.  Cardiovascular:     Rate and Rhythm: Normal rate and regular rhythm.  Pulmonary:     Effort: Pulmonary effort is normal. No respiratory distress.     Breath sounds: Normal breath sounds.     Comments: on nasal cannula in no distress Abdominal:     Palpations: Abdomen is soft.     Tenderness: There is no abdominal tenderness.  Musculoskeletal:     Cervical back: Neck supple.     Right lower leg: Edema present.     Left lower leg: Edema present.     Comments: 2+ bilateral LE edema  Skin:    General: Skin is warm and dry.     Capillary Refill: Capillary refill takes less than 2 seconds.  Neurological:     Mental Status: He is alert.  Psychiatric:        Mood and Affect: Mood normal.     (all labs ordered are listed, but only abnormal results are displayed) Labs Reviewed  RESP PANEL BY RT-PCR (RSV, FLU A&B, COVID)  [id number]  BASIC METABOLIC PANEL WITH GFR  PRO BRAIN NATRIURETIC PEPTIDE  TROPONIN T, HIGH SENSITIVITY    EKG: EKG Interpretation Date/Time:  Tuesday December 26 2023 11:00:34 EST Ventricular Rate:  85 PR Interval:  172 QRS Duration:  96 QT Interval:  381 QTC Calculation: 453 R Axis:   94  Text Interpretation: Sinus rhythm Atrial premature complex Right axis deviation Low voltage, precordial leads Confirmed by [name] ([id number]) on 12/26/2023 12:27:57 PM  Radiology: DG Chest 2 View Result Date:  12/26/2023 CLINICAL DATA:  Shortness of breath. EXAM: CHEST - 2 VIEW COMPARISON:  Radiographs 10/09/2023.  CT 10/09/2023. FINDINGS: Lateral view limited by body habitus and suboptimal inspiration. The heart size and mediastinal contours appear stable. Similar chronic band like atelectasis or scarring at both lung bases. No evidence of confluent airspace disease, edema, significant pleural effusion or pneumothorax. The bones appear unchanged. IMPRESSION: Stable chest. No acute cardiopulmonary process. Chronic bibasilar atelectasis or scarring. Electronically Signed   By: Elsie Gertrude HERO.D.  On: 12/26/2023 11:14     Procedures   Medications Ordered in the ED  furosemide  (LASIX ) injection 20 mg (20 mg Intravenous Given 12/26/23 1355)                                    Medical Decision Making Amount and/or Complexity of Data Reviewed Radiology: ordered.  Risk Prescription drug management.    63 year old male with medical history significant for diabetes mellitus, HTN, HLD, degenerative disc disease in the lumbar spine, osteoarthritis of multiple joints, morbid obesity obesity hypoventilation syndrome, asthma, CHF with a last EF of 65 to 70% in September 2025 with mild diastolic dysfunction who presents to the emergency department from his facility with concern for fluid overload.  The patient states that he is having worsening swelling in his legs bilaterally.  He denies any chest pain or shortness of breath.  He had reportedly endorsed shortness of breath at his facility yesterday but the patient denies this.   On arrival, the patient was afebrile, not tachycardic or tachypneic, hemodynamically stable, saturating well on his baseline home O2 of 4 L/min nasal cannula.  Patient with 2+ pitting edema in the bilateral lower extremities, no rales appreciated on exam.  Considered worsening heart failure exacerbation.  Patient administered Lasix  20 mg.  Swelling is symmetric, low concern for DVT,  low concern for PE, ACS or other acute intrathoracic abnormality.  Will evaluate further with EKG, chest x-ray and laboratory evaluation.  EKG: Sinus rhythm, ventricular rate 85, no acute ischemic changes  Chest x-ray: Stable chest, no acute cardiopulmonary process, chronic bibasilar atelectasis or scarring  Labs: COVID flu and RSV PCR testing negative, remaining labs pending at time of signout.  Signout given to Dr. Dean at 1500 to follow-up results of laboratory evaluation and reassess the patient.     Final diagnoses:  None    ED Discharge Orders     None          James Agent, MD 12/26/23 [id number]

## 2023-12-26 NOTE — ED Provider Notes (Signed)
 Pt signed out by Dr. Jerrol pending 2nd troponin.  His 2nd troponin was 87 which is stable.  He was given lasix  iv and has diuresed.  He was hypoxic on RA, but is supposed to be on oxygen.  O2 is fine on oxygen.  He is encouraged to wear it (he thinks he does not need it).  His potassium is low, so he is given a dose of that and d/c with kdur.  Pt is stable for d/c.  Return if worse.  F/u with pcp.   Dean Clarity, MD 12/26/23 [phone]

## 2023-12-26 NOTE — Discharge Instructions (Addendum)
 Per cardiology, continue Lasix  20 mg daily with additional 20 mg as needed for weight greater than 3 pounds in 1 day or 5 pounds in 1 week and lower extremity edema.   Continue CPAP nightly.  Pt needs to be on 2L oxygen at all times.

## 2023-12-28 NOTE — Progress Notes (Deleted)
°  Cardiology Office Note:  .   Date:  12/28/2023  ID:  James [name], DOB December 26, 1960, MRN [id number] PCP: Edman Marsa PARAS, DO  [facility] Providers Cardiologist:  Wilbert Bihari, MD {  History of Present Illness: .   James [name] is a 63 y.o. male with history of  chronic pain syndrome, history of DVT on Eliquis , type 2 diabetes with neuropathy, hypertension, asthma, arthritis, chronic respiratory failure.     SOB 09/2023 admission for shortness of breath and hypoxia.  proBNP less than 50.  EF normal.  Troponins mildly elevated and flat, felt to be demand ischemia.  Low suspicion for CHF.  Social history      Patient with no prior cardiac history.  Seen plan 09/2023 for acute respiratory failure and hypoxia.  EF normal.  proBNP normal.  Not felt to be related to cardiac system.  Last seen 10/2023 and overall seems compensated.  Since then he has been seen recently in the ED for worsening peripheral edema and shortness of breath.  Given a dose of Lasix  and discharged home.  Troponin mildly elevated.  Chronic respiratory failure  Hypertension  OSA  Hyperlipidemia  DVT   ROS: Denies: Chest pain, shortness of breath, orthopnea, peripheral edema, palpitations, decreased exercise intolerance, fatigue, lightheadedness.   Studies Reviewed: .         Risk Assessment/Calculations:   {Does this patient have ATRIAL FIBRILLATION?[phone]} No BP recorded.  {Refresh Note OR Click here to enter BP  :1}***       Physical Exam:   VS:  There were no vitals taken for this visit.   Wt Readings from Last 3 Encounters:  10/31/23 (!) 348 lb (157.9 kg)  10/30/23 (!) 348 lb (157.9 kg)  10/13/23 (!) 357 lb 12.9 oz (162.3 kg)    GEN: Well nourished, well developed in no acute distress NECK: No JVD; No carotid bruits CARDIAC: ***RRR, no murmurs, rubs, gallops RESPIRATORY:  Clear to auscultation without rales, wheezing or rhonchi  ABDOMEN: Soft, non-tender,  non-distended EXTREMITIES:  No edema; No deformity   ASSESSMENT AND PLAN: .         {Are you ordering a CV Procedure (e.g. stress test, cath, DCCV, TEE, etc)?   Press F2        :[id number]}  Dispo: ***  Signed, Thom LITTIE Sluder, PA-C

## 2023-12-29 ENCOUNTER — Ambulatory Visit: Attending: Cardiology | Admitting: Cardiology

## 2024-01-02 ENCOUNTER — Ambulatory Visit (HOSPITAL_BASED_OUTPATIENT_CLINIC_OR_DEPARTMENT_OTHER)

## 2024-01-03 ENCOUNTER — Encounter: Payer: Self-pay | Admitting: Diagnostic Neuroimaging

## 2024-01-03 ENCOUNTER — Ambulatory Visit: Admitting: Diagnostic Neuroimaging

## 2024-01-03 VITALS — BP 144/86 | HR 91 | Ht 72.0 in | Wt 348.0 lb

## 2024-01-03 DIAGNOSIS — R253 Fasciculation: Secondary | ICD-10-CM

## 2024-01-03 NOTE — Progress Notes (Unsigned)
 [facility]  PATIENT: James [name] DOB: 1960-10-11  REFERRING CLINICIAN: Cinderella Goldman, MD HISTORY FROM: patient  REASON FOR VISIT: new consult   HISTORICAL  CHIEF COMPLAINT:  Chief Complaint  Patient presents with   RM 21    Patient is here with EMS for twitching - stated his potassium was low and was given medication to help resolve the issue     HISTORY OF PRESENT ILLNESS:   63 year old male with history of hypertension, obesity, DVT, chronic pain syndrome, neuropathy, here for evaluation of muscle twitching.  Patient reports prior history of intermittent muscle twitching in his arms and legs.  He was evaluated by PCP as well as had some ER evaluations.  At some point he was diagnosed with low potassium level, and following treatment his symptoms seem to have resolved.  Today patient does not have any muscle twitching issues.  He does complain of some bilateral hip arthritis and pain issues, awaiting orthopedic evaluation and treatment.   REVIEW OF SYSTEMS: Full 14 system review of systems performed and negative with exception of: as per HPI.  ALLERGIES: Allergies[1]  HOME MEDICATIONS: Outpatient Medications Prior to Visit  Medication Sig Dispense Refill   apixaban  (ELIQUIS ) 5 MG TABS tablet Take 5 mg by mouth 2 (two) times daily.     atorvastatin  (LIPITOR) 40 MG tablet Take 1 tablet by mouth once daily 90 tablet 0   Blood Glucose Monitoring Suppl (ONETOUCH VERIO) w/Device KIT Use to check blood sugar 1 x daily 1 kit 0   CHANTIX  1 MG tablet Take 1 mg by mouth 2 (two) times daily. (Patient taking differently: Take 0.5 mg by mouth daily.)     Cholecalciferol  50 MCG (2000 UT) TABS Take 2,000 Units by mouth daily.     cyclobenzaprine  (FLEXERIL ) 10 MG tablet Take 1 tablet (10 mg total) by mouth 3 (three) times daily as needed for muscle spasms.     diclofenac  Sodium (VOLTAREN ) 1 % GEL Apply 2 g topically 4 (four) times daily. Apply to knees      fluticasone -salmeterol (ADVAIR DISKUS) 250-50 MCG/ACT AEPB Inhale 1 puff into the lungs in the morning and at bedtime. 60 each 2   furosemide  (LASIX ) 20 MG tablet Take 1 tablet (20 mg total) by mouth daily. And additional 20 mg as needed for weight gain more than 3 lbs in 1 day or 5lbs in 1 week, with shortness of breath or edema     Insulin  Pen Needle (PEN NEEDLES 3/16) 31G X 5 MM MISC 1 each by Does not apply route 4 (four) times daily -  before meals and at bedtime. 100 each 0   ipratropium-albuterol  (DUONEB) 0.5-2.5 (3) MG/3ML SOLN Take 3 mLs by nebulization every 4 (four) hours as needed (SOB or Wheezing).     lactulose  (CHRONULAC ) 10 GM/15ML solution Take 45 mLs (30 g total) by mouth daily as needed for mild constipation.     losartan  (COZAAR ) 25 MG tablet Take 1 tablet by mouth once daily 90 tablet 0   metFORMIN  (GLUCOPHAGE ) 1000 MG tablet Take 1 tablet (1,000 mg total) by mouth in the morning and at bedtime.     morphine  (MS CONTIN ) 15 MG 12 hr tablet Take 1 tablet (15 mg total) by mouth every 12 (twelve) hours. 10 tablet 0   OneTouch Delica Lancets 33G MISC Use to check blood sugar 1 x daily 100 each 3   ONETOUCH VERIO test strip Use to check blood sugar 1 x daily 100 each  3   Oxycodone  HCl 10 MG TABS Take 1 tablet (10 mg total) by mouth every 6 (six) hours as needed (moderate/severe pain). Must last 30 days. 10 tablet 0   OZEMPIC , 1 MG/DOSE, 4 MG/3ML SOPN INJECT 1MG  INTO THE SKIN ONCE A WEEK 9 mL 0   polyethylene glycol (MIRALAX  / GLYCOLAX ) 17 g packet Take 17 g by mouth 2 (two) times daily.     potassium chloride  SA (KLOR-CON  M) 20 MEQ tablet Take 1 tablet (20 mEq total) by mouth daily. 30 tablet 0   primidone  (MYSOLINE ) 50 MG tablet Take 50 mg by mouth at bedtime.     traZODone  (DESYREL ) 50 MG tablet Take 50 mg by mouth at bedtime.     pregabalin  (LYRICA ) 200 MG capsule Take 1 capsule (200 mg total) by mouth 2 (two) times daily. 60 capsule 0   No facility-administered medications prior  to visit.    PAST MEDICAL HISTORY: Past Medical History:  Diagnosis Date   Arthritis    Diabetes mellitus without complication (HCC)    Hyperlipidemia    Hypertension     PAST SURGICAL HISTORY: Past Surgical History:  Procedure Laterality Date   APPENDECTOMY     COLONOSCOPY WITH PROPOFOL      COLONOSCOPY WITH PROPOFOL  N/A 04/23/2021   Procedure: COLONOSCOPY WITH PROPOFOL ;  Surgeon: Therisa Bi, MD;  Location: [facility] ENDOSCOPY;  Service: Gastroenterology;  Laterality: N/A;    FAMILY HISTORY: Family History  Problem Relation Age of Onset   Heart disease Mother    Arthritis Mother    Arthritis Father     SOCIAL HISTORY: Social History   Socioeconomic History   Marital status: Single    Spouse name: Not on file   Number of children: Not on file   Years of education: Not on file   Highest education level: Not on file  Occupational History   Not on file  Tobacco Use   Smoking status: Former    Current packs/day: 0.10    Average packs/day: 0.1 packs/day for 25.0 years (2.5 ttl pk-yrs)    Types: Cigarettes    Passive exposure: Past   Smokeless tobacco: Never  Vaping Use   Vaping status: Never Used  Substance and Sexual Activity   Alcohol use: Never   Drug use: Never   Sexual activity: Not on file  Other Topics Concern   Not on file  Social History Narrative   Not on file   Social Drivers of Health   Tobacco Use: Medium Risk (01/03/2024)   Patient History    Smoking Tobacco Use: Former    Smokeless Tobacco Use: Never    Passive Exposure: Past  Physicist, Medical Strain: Low Risk (03/31/2023)   Overall Financial Resource Strain (CARDIA)    Difficulty of Paying Living Expenses: Not hard at all  Food Insecurity: No Food Insecurity (10/10/2023)   Epic    Worried About Radiation Protection Practitioner of Food in the Last Year: Never true    Ran Out of Food in the Last Year: Never true  Transportation Needs: No Transportation Needs (10/10/2023)   Epic    Lack of Transportation  (Medical): No    Lack of Transportation (Non-Medical): No  Physical Activity: Inactive (03/31/2023)   Exercise Vital Sign    Days of Exercise per Week: 0 days    Minutes of Exercise per Session: 0 min  Stress: No Stress Concern Present (03/31/2023)   [employer] of Occupational Health - Occupational Stress Questionnaire    Feeling of Stress :  Only a little  Social Connections: Socially Isolated (03/31/2023)   Social Connection and Isolation Panel    Frequency of Communication with Friends and Family: More than three times a week    Frequency of Social Gatherings with Friends and Family: Once a week    Attends Religious Services: Never    Database Administrator or Organizations: No    Attends Banker Meetings: Never    Marital Status: Divorced  Catering Manager Violence: Not At Risk (10/10/2023)   Epic    Fear of Current or Ex-Partner: No    Emotionally Abused: No    Physically Abused: No    Sexually Abused: No  Depression (PHQ2-9): Low Risk (03/31/2023)   Depression (PHQ2-9)    PHQ-2 Score: 1  Alcohol Screen: Low Risk (03/31/2023)   Alcohol Screen    Last Alcohol Screening Score (AUDIT): 0  Housing: Low Risk (10/10/2023)   Epic    Unable to Pay for Housing in the Last Year: No    Number of Times Moved in the Last Year: 0    Homeless in the Last Year: No  Utilities: Not At Risk (10/10/2023)   Epic    Threatened with loss of utilities: No  Health Literacy: Adequate Health Literacy (03/31/2023)   B1300 Health Literacy    Frequency of need for help with medical instructions: Never     PHYSICAL EXAM  GENERAL EXAM/CONSTITUTIONAL: Vitals:  Vitals:   01/03/24 0946  BP: (!) 144/86  Pulse: 91  SpO2: 97%  Weight: (!) 348 lb (157.9 kg)  Height: 6' (1.829 m)   Body mass index is 47.2 kg/m. Wt Readings from Last 3 Encounters:  01/03/24 (!) 348 lb (157.9 kg)  10/31/23 (!) 348 lb (157.9 kg)  10/30/23 (!) 348 lb (157.9 kg)   Patient is in no distress; well  developed, nourished and groomed; neck is supple  CARDIOVASCULAR: Examination of carotid arteries is normal; no carotid bruits Regular rate and rhythm, no murmurs Examination of peripheral vascular system by observation and palpation is normal  MUSCULOSKELETAL: Gait, strength, tone, movements noted in Neurologic exam below  NEUROLOGIC: MENTAL STATUS:      No data to display         awake, alert, oriented to person, place and time recent and remote memory intact normal attention and concentration language fluent, comprehension intact, naming intact fund of knowledge appropriate  CRANIAL NERVE:  2nd, 3rd, 4th, 6th - pupils equal and reactive to light, visual fields full to confrontation, extraocular muscles intact, no nystagmus 5th - facial sensation symmetric 7th - facial strength symmetric 8th - hearing intact 9th - palate elevates symmetrically, uvula midline 11th - shoulder shrug symmetric 12th - tongue protrusion midline  MOTOR:  normal bulk and tone BUE 3-4 BLE 2-3  SENSORY:  normal and symmetric to light touch, temperature, vibration; DECR IN FEET / ANKLES  COORDINATION:  finger-nose-finger, fine finger movements normal  REFLEXES:  deep tendon reflexes TRACE and symmetric  GAIT/STATION:  IN TRANSPORT STRETCHER     DIAGNOSTIC DATA (LABS, IMAGING, TESTING) - I reviewed patient records, labs, notes, testing and imaging myself where available.  Lab Results  Component Value Date   WBC 7.7 12/26/2023   HGB 12.9 (L) 12/26/2023   HCT 42.7 12/26/2023   MCV 94.5 12/26/2023   PLT 281 12/26/2023      Component Value Date/Time   NA 142 12/26/2023 1451   NA 137 02/15/2021 1527   K 3.3 (L) 12/26/2023 1451  CL 93 (L) 12/26/2023 1451   CO2 42 (H) 12/26/2023 1451   GLUCOSE 129 (H) 12/26/2023 1451   BUN 6 (L) 12/26/2023 1451   BUN 8 02/15/2021 1527   CREATININE 0.39 (L) 12/26/2023 1451   CREATININE 0.67 (L) 06/25/2021 0955   CALCIUM  8.5 (L) 12/26/2023  1451   PROT 6.7 10/10/2023 0806   PROT 7.0 02/15/2021 1527   ALBUMIN 3.7 10/10/2023 0806   ALBUMIN 4.1 02/15/2021 1527   AST 14 (L) 10/10/2023 0806   ALT <5 10/10/2023 0806   ALKPHOS 115 10/10/2023 0806   BILITOT 1.0 10/10/2023 0806   BILITOT 0.4 02/15/2021 1527   GFRNONAA >60 12/26/2023 1451   Lab Results  Component Value Date   CHOL 90 10/11/2023   HDL 33 (L) 10/11/2023   LDLCALC 39 10/11/2023   TRIG 90 10/11/2023   CHOLHDL 2.7 10/11/2023   Lab Results  Component Value Date   HGBA1C 5.8 (H) 10/10/2023   Lab Results  Component Value Date   VITAMINB12 251 11/16/2022   No results found for: TSH    ASSESSMENT AND PLAN  63 y.o. year old male here with:   Dx:  1. Muscle twitching     PLAN:  INTERMITTENT MUSCLE TWITCHING (arms / legs) - now resolved; could have been related to prior electrolyte abnormalities, deconditioning, metabolic derangements or other factors including neuropathy, lumbar spinal stenosis and chronic pain syndrome - monitor symptoms; may return as needed  Return for return to PCP, pending if symptoms worsen or fail to improve.    EDUARD FABIENE HANLON, MD 01/04/2024, 8:32 PM Certified in Neurology, Neurophysiology and Neuroimaging  [facility] [address] [place], [place] [address] [phone]     [1] No Known Allergies

## 2024-01-04 ENCOUNTER — Encounter: Payer: Self-pay | Admitting: Diagnostic Neuroimaging

## 2024-01-09 ENCOUNTER — Encounter (HOSPITAL_BASED_OUTPATIENT_CLINIC_OR_DEPARTMENT_OTHER): Payer: Self-pay | Admitting: Internal Medicine

## 2024-01-16 ENCOUNTER — Encounter (HOSPITAL_BASED_OUTPATIENT_CLINIC_OR_DEPARTMENT_OTHER): Payer: Self-pay | Admitting: Internal Medicine

## 2024-01-16 DIAGNOSIS — G4733 Obstructive sleep apnea (adult) (pediatric): Secondary | ICD-10-CM

## 2024-01-24 ENCOUNTER — Ambulatory Visit (HOSPITAL_BASED_OUTPATIENT_CLINIC_OR_DEPARTMENT_OTHER): Attending: Urgent Care

## 2024-01-24 DIAGNOSIS — G4733 Obstructive sleep apnea (adult) (pediatric): Secondary | ICD-10-CM | POA: Insufficient documentation

## 2024-02-16 ENCOUNTER — Ambulatory Visit: Admitting: Sports Medicine

## 2024-02-18 DIAGNOSIS — G4733 Obstructive sleep apnea (adult) (pediatric): Secondary | ICD-10-CM

## 2024-02-18 NOTE — Progress Notes (Signed)
 "    [facility] [address] [place], [place] [address] Tel: [phone]   Fax: [phone]  Home Sleep Test Interpretation  Patient Name: James [name], James [name] Date: 01/24/2024  Date of Birth: [birth year] Study Type: HST  Age: 64 year MRN #: [id number]  Sex: Male Interpreting Physician: THEODORO SAMMI COTTA  Height: 6' Referring Physician: GARNETTE BANNER [phone])  Weight: 348.0 lbs Recording Tech: Will Poet RRT RPSGT RST  BMI: 47.7 Scoring Tech: Will Poet RRT RPSGT RST  ESS: - Neck Size: -  %%startinterp%% %%startinterp%% Indications for Polysomnography The patient is a 64 year-old Male who is 6' and weighs 348.0 lbs. His BMI equals 47.7.  A home sleep apnea test was performed to evaluate for -.  Medication  No Data.   Polysomnogram Data A home sleep test recorded the standard physiologic parameters including EKG, nasal and oral airflow.  Respiratory parameters of chest and abdominal movements were recorded with Respiratory Inductance Plethysmography belts.  Oxygen saturation was recorded by pulse oximetry.   Study Architecture The total recording time of the polysomnogram was 361.8 minutes.  The total monitoring time was 362.5 minutes.  Time spent in Supine position was 362.5 minutes.   Respiratory Events The study revealed a presence of 97 obstructive, 6 central, and 2 mixed apneas resulting in an Apnea index of 17.4 events per hour.  There were 545 hypopneas (>=3% desaturation and/or arousal) resulting in an Apnea\Hypopnea Index (AHI >=3% desaturation and/or arousal) of 107.6 events per hour.  There were 536 hypopneas (>=4% desaturation) resulting in an Apnea\Hypopnea Index (AHI >=4% desaturation) of 106.1 events per hour.  There were - Respiratory Effort Related Arousals resulting in a RERA index of - events per hour. The Respiratory Disturbance Index is 107.6 events per hour.  The snore index was - events per hour.  Mean  oxygen saturation was 81.0%.  The lowest oxygen saturation during monitoring time was 72.0%.  Time spent <=88% oxygen saturation was 343.1 minutes (96.0%).  Cardiac Summary The average pulse rate was 88.2 bpm.  The minimum pulse rate was 44.0 bpm while the maximum pulse rate was 227.0 bpm.  Cardiac rhythm was normal/abnormal.  Comments: The overall frequency of apnea/hypopnea (AHI4%) was 106.1 events per hour.  The lowest oxyhemoglobin saturation was 72 %.  Time spent below 88% was 343 mins.   All sleep was in supine position.  Periodic breathing seen.    Diagnosis:  Severe OSA  Recommendations: - Patient should return to sleep lab for PAP titration (preferred) OR Treatment with auto CPAP 8-12 cm of H20 with close follow up with a sleep specialist. - Side sleeping is recommended to aid in managing sleep apnea. - A nocturnal pulse oximetry should be done on Auto CPAP to ensure resolution of nocturnal hypoxia. - Close clinical follow-up with compliance monitoring is recommended to optimize treatment efficacy. - Refrain from driving or operating heavy machinery when drowsy or after taking medications that may cause sedation. - Encourage maintenance of optimal weight and adhering regular sleep wake timing.    This study was personally reviewed and electronically signed by: Theodoro Sammi, MD Accredited Board Certified in Sleep Medicine Date/Time:     %%endinterp%% %%endinterp%%    Study Overview  Recording Time: 640.9 min. Monitoring Time: 362.5 min.  Analysis Start:  09:30:06 PM Supine Time: 362.5 min.  Analysis Stop:  03:31:52 AM     Study Summary   Count Index Longest Event Duration  Apneas & Hypopneas: 650 107.6  Apneas: 65.7 sec.     Hypopneas: 93.1 sec.  RERAs: - - - sec.  Desaturations: 683 113.0 96.6 sec.  Snores: - - - sec.    Minimum Oxygen Saturation: 72.0%    Respiratory Summary   Total Duration Supine Non-Supine   Count Index Average Longest Count Index  Count Index  Obstructive Apnea 97 16.1 13.7 65.7 97 16.1 - -   Mixed Apnea 2 0.3 17.1 21.7 2 0.3 - -   Central Apnea 6 1.0 11.3 13.1 6 1.0 - -   Total Apneas 105 17.4 13.6 65.7 105 17.4 - -            Hypopneas 3% 545 90.2 N.A. N.A. 545 90.2 - -   Apneas & Hyp. 3% 650 107.6 N.A. N.A. 650 107.6 - -            Hypopneas 4% 536 88.7 N.A. N.A. 536 88.7 - -  Apneas & Hyp. 4% 641 106.1 N.A. N.A. 641 106.1 - -             RERAs - - - - - - - -  RDI 678 112.2 N.A. N.A. 678 112.2 - -   Oxygen Saturation Summary   Total Supine Non-Supine  Average SpO2 81.0% 81.0% -  Minimum SpO2 72.0% 72.0% -   Maximum SpO2 96.0% 96.0% -   Oxygen Saturation Distribution  Range (%) Time in range (min) Time in range (%)  90.0 - 100.0 6.7 1.9%  80.0 - 90.0 182.7 51.1%  70.0 - 80.0 167.9 47.0%  60.0 - 70.0 - -  50.0 - 60.0 - -  0.0 - 50.0 - -  Time Spent <=88% SpO2  Range (%) Time in range (min) Time in range (%)  0.0 - 88.0 343.1 96.0%  Cardiac Summary   Total Supine Non-Supine  Average Pulse Rate (BPM) 88.2 88.2 -  Minimum Pulse Rate (BPM) 44.0 44.0 -  Maximum Pulse Rate (BPM) 227.0 227.0 -                     Technologist Comments  -      Poppy Mcafee Diplomate, American Board of Sleep Medicine  ELECTRONICALLY SIGNED ON:  02/18/2024, 7:40 PM [facility] PH: [phone]   FX: [phone] ACCREDITED BY THE AMERICAN ACADEMY OF SLEEP MEDICINE "

## 2024-02-22 ENCOUNTER — Telehealth: Payer: Self-pay

## 2024-02-22 NOTE — Telephone Encounter (Signed)
 Tried calling the numbers provided in the chart and one sent me to a rehab facility and the other went straight to vm.  Also tried the brothers number, and there was no answer.  LMTCB to schedule 1st avail with Dr. Theodoro to review sleep study and to bring his machine with him.   Admin can you assist in scheduling appt?

## 2024-02-22 NOTE — Telephone Encounter (Signed)
-----   Message from Sammi Fredericks, MD sent at 02/18/2024  7:43 PM EST ----- Please schedule follow up for 1st available to discuss sleep study. Please ask him to bring his machine. Advise side sleep.

## 2024-03-01 ENCOUNTER — Ambulatory Visit: Admitting: Sports Medicine

## 2024-03-04 ENCOUNTER — Ambulatory Visit: Admitting: Sports Medicine

## 2024-04-05 ENCOUNTER — Ambulatory Visit
